# Patient Record
Sex: Female | Born: 1940 | Race: White | Hispanic: No | Smoking: Never smoker
Health system: Southern US, Community
[De-identification: ages and names within clinical notes are randomized; demographics above are authoritative.]

## PROBLEM LIST (undated history)

## (undated) DIAGNOSIS — L678 Other hair color and hair shaft abnormalities: Secondary | ICD-10-CM

## (undated) DIAGNOSIS — B369 Superficial mycosis, unspecified: Secondary | ICD-10-CM

## (undated) DIAGNOSIS — R87619 Unspecified abnormal cytological findings in specimens from cervix uteri: Secondary | ICD-10-CM

## (undated) DIAGNOSIS — E119 Type 2 diabetes mellitus without complications: Secondary | ICD-10-CM

## (undated) DIAGNOSIS — I10 Essential (primary) hypertension: Secondary | ICD-10-CM

## (undated) DIAGNOSIS — F32A Depression, unspecified: Secondary | ICD-10-CM

## (undated) DIAGNOSIS — F329 Major depressive disorder, single episode, unspecified: Secondary | ICD-10-CM

## (undated) DIAGNOSIS — F410 Panic disorder [episodic paroxysmal anxiety] without agoraphobia: Secondary | ICD-10-CM

## (undated) DIAGNOSIS — C549 Malignant neoplasm of corpus uteri, unspecified: Secondary | ICD-10-CM

## (undated) DIAGNOSIS — L738 Other specified follicular disorders: Secondary | ICD-10-CM

## (undated) DIAGNOSIS — Z86711 Personal history of pulmonary embolism: Secondary | ICD-10-CM

## (undated) DIAGNOSIS — M549 Dorsalgia, unspecified: Secondary | ICD-10-CM

## (undated) DIAGNOSIS — E785 Hyperlipidemia, unspecified: Secondary | ICD-10-CM

## (undated) DIAGNOSIS — M48061 Spinal stenosis, lumbar region without neurogenic claudication: Secondary | ICD-10-CM

## (undated) DIAGNOSIS — J309 Allergic rhinitis, unspecified: Secondary | ICD-10-CM

## (undated) DIAGNOSIS — M25559 Pain in unspecified hip: Secondary | ICD-10-CM

## (undated) DIAGNOSIS — S92901A Unspecified fracture of right foot, initial encounter for closed fracture: Secondary | ICD-10-CM

## (undated) DIAGNOSIS — R609 Edema, unspecified: Secondary | ICD-10-CM

## (undated) HISTORY — DX: Edema, unspecified: R60.9

## (undated) HISTORY — DX: Hyperlipidemia, unspecified: E78.5

## (undated) HISTORY — DX: Panic disorder (episodic paroxysmal anxiety): F41.0

## (undated) HISTORY — DX: Major depressive disorder, single episode, unspecified: F32.9

## (undated) HISTORY — PX: TUBAL LIGATION: SHX77

## (undated) HISTORY — DX: Other specified follicular disorders: L73.8

## (undated) HISTORY — DX: Malignant neoplasm of corpus uteri, unspecified: C54.9

## (undated) HISTORY — DX: Unspecified abnormal cytological findings in specimens from cervix uteri: R87.619

## (undated) HISTORY — PX: BREAST BIOPSY: SHX20

## (undated) HISTORY — DX: Depression, unspecified: F32.A

## (undated) HISTORY — DX: Superficial mycosis, unspecified: B36.9

## (undated) HISTORY — DX: Morbid (severe) obesity due to excess calories: E66.01

## (undated) HISTORY — DX: Allergic rhinitis, unspecified: J30.9

## (undated) HISTORY — DX: Essential (primary) hypertension: I10

## (undated) HISTORY — DX: Type 2 diabetes mellitus without complications: E11.9

## (undated) HISTORY — DX: Dorsalgia, unspecified: M54.9

## (undated) HISTORY — DX: Spinal stenosis, lumbar region without neurogenic claudication: M48.061

## (undated) HISTORY — DX: Other hair color and hair shaft abnormalities: L67.8

## (undated) HISTORY — DX: Personal history of pulmonary embolism: Z86.711

## (undated) HISTORY — DX: Unspecified fracture of right foot, initial encounter for closed fracture: S92.901A

## (undated) HISTORY — DX: Pain in unspecified hip: M25.559

## (undated) HISTORY — PX: TONSILLECTOMY: SUR1361

---

## 1998-04-09 ENCOUNTER — Other Ambulatory Visit: Admission: RE | Admit: 1998-04-09 | Discharge: 1998-04-09 | Payer: Self-pay | Admitting: *Deleted

## 1999-05-11 ENCOUNTER — Emergency Department (HOSPITAL_COMMUNITY): Admission: EM | Admit: 1999-05-11 | Discharge: 1999-05-11 | Payer: Self-pay | Admitting: Emergency Medicine

## 1999-05-11 ENCOUNTER — Encounter: Payer: Self-pay | Admitting: Emergency Medicine

## 1999-05-17 ENCOUNTER — Encounter: Payer: Self-pay | Admitting: Emergency Medicine

## 1999-05-17 ENCOUNTER — Inpatient Hospital Stay (HOSPITAL_COMMUNITY): Admission: EM | Admit: 1999-05-17 | Discharge: 1999-05-23 | Payer: Self-pay | Admitting: Emergency Medicine

## 1999-06-04 ENCOUNTER — Other Ambulatory Visit: Admission: RE | Admit: 1999-06-04 | Discharge: 1999-06-04 | Payer: Self-pay | Admitting: *Deleted

## 2000-08-24 ENCOUNTER — Other Ambulatory Visit: Admission: RE | Admit: 2000-08-24 | Discharge: 2000-08-24 | Payer: Self-pay | Admitting: *Deleted

## 2004-03-25 ENCOUNTER — Encounter: Payer: Self-pay | Admitting: Family Medicine

## 2004-03-25 ENCOUNTER — Other Ambulatory Visit: Admission: RE | Admit: 2004-03-25 | Discharge: 2004-03-25 | Payer: Self-pay | Admitting: Family Medicine

## 2004-10-12 ENCOUNTER — Ambulatory Visit: Payer: Self-pay | Admitting: Family Medicine

## 2005-08-03 ENCOUNTER — Ambulatory Visit: Payer: Self-pay | Admitting: Family Medicine

## 2005-09-06 ENCOUNTER — Ambulatory Visit: Payer: Self-pay | Admitting: Family Medicine

## 2005-09-08 ENCOUNTER — Ambulatory Visit: Payer: Self-pay | Admitting: Family Medicine

## 2005-09-14 ENCOUNTER — Encounter: Admission: RE | Admit: 2005-09-14 | Discharge: 2005-09-14 | Payer: Self-pay | Admitting: Family Medicine

## 2005-09-14 ENCOUNTER — Ambulatory Visit: Payer: Self-pay | Admitting: Family Medicine

## 2005-09-28 ENCOUNTER — Ambulatory Visit: Payer: Self-pay | Admitting: Family Medicine

## 2005-10-01 ENCOUNTER — Ambulatory Visit: Payer: Self-pay | Admitting: Family Medicine

## 2005-10-08 ENCOUNTER — Ambulatory Visit: Payer: Self-pay | Admitting: Internal Medicine

## 2005-10-25 ENCOUNTER — Ambulatory Visit: Payer: Self-pay | Admitting: Family Medicine

## 2005-11-24 ENCOUNTER — Ambulatory Visit: Payer: Self-pay | Admitting: Family Medicine

## 2005-11-25 ENCOUNTER — Ambulatory Visit: Payer: Self-pay | Admitting: Family Medicine

## 2005-12-07 ENCOUNTER — Ambulatory Visit: Payer: Self-pay | Admitting: Family Medicine

## 2005-12-23 ENCOUNTER — Ambulatory Visit: Payer: Self-pay | Admitting: Family Medicine

## 2006-03-23 ENCOUNTER — Ambulatory Visit: Payer: Self-pay | Admitting: Family Medicine

## 2006-03-25 ENCOUNTER — Ambulatory Visit: Payer: Self-pay | Admitting: Family Medicine

## 2006-06-06 ENCOUNTER — Ambulatory Visit: Payer: Self-pay | Admitting: Family Medicine

## 2006-06-24 ENCOUNTER — Ambulatory Visit: Payer: Self-pay | Admitting: Family Medicine

## 2006-08-04 ENCOUNTER — Ambulatory Visit: Payer: Self-pay | Admitting: Family Medicine

## 2006-08-12 ENCOUNTER — Ambulatory Visit: Payer: Self-pay | Admitting: Family Medicine

## 2006-08-25 HISTORY — PX: ENDOMETRIAL BIOPSY: SHX622

## 2006-08-30 ENCOUNTER — Ambulatory Visit: Payer: Self-pay | Admitting: Family Medicine

## 2006-08-31 ENCOUNTER — Ambulatory Visit (HOSPITAL_COMMUNITY): Admission: RE | Admit: 2006-08-31 | Discharge: 2006-08-31 | Payer: Self-pay | Admitting: Family Medicine

## 2006-09-01 ENCOUNTER — Ambulatory Visit: Payer: Self-pay | Admitting: Family Medicine

## 2006-09-19 ENCOUNTER — Ambulatory Visit: Payer: Self-pay | Admitting: Family Medicine

## 2006-10-10 ENCOUNTER — Ambulatory Visit: Payer: Self-pay | Admitting: Family Medicine

## 2006-10-10 ENCOUNTER — Ambulatory Visit (HOSPITAL_COMMUNITY): Admission: RE | Admit: 2006-10-10 | Discharge: 2006-10-10 | Payer: Self-pay | Admitting: Family Medicine

## 2006-10-10 ENCOUNTER — Encounter (INDEPENDENT_AMBULATORY_CARE_PROVIDER_SITE_OTHER): Payer: Self-pay | Admitting: *Deleted

## 2007-02-08 ENCOUNTER — Ambulatory Visit: Payer: Self-pay | Admitting: Family Medicine

## 2007-03-24 ENCOUNTER — Encounter: Payer: Self-pay | Admitting: Family Medicine

## 2007-03-24 DIAGNOSIS — M549 Dorsalgia, unspecified: Secondary | ICD-10-CM | POA: Insufficient documentation

## 2007-03-24 DIAGNOSIS — I10 Essential (primary) hypertension: Secondary | ICD-10-CM | POA: Insufficient documentation

## 2007-03-24 DIAGNOSIS — E1169 Type 2 diabetes mellitus with other specified complication: Secondary | ICD-10-CM | POA: Insufficient documentation

## 2007-03-24 DIAGNOSIS — F32A Depression, unspecified: Secondary | ICD-10-CM | POA: Insufficient documentation

## 2007-03-24 DIAGNOSIS — Z86718 Personal history of other venous thrombosis and embolism: Secondary | ICD-10-CM | POA: Insufficient documentation

## 2007-03-24 DIAGNOSIS — R609 Edema, unspecified: Secondary | ICD-10-CM

## 2007-03-24 DIAGNOSIS — F410 Panic disorder [episodic paroxysmal anxiety] without agoraphobia: Secondary | ICD-10-CM | POA: Insufficient documentation

## 2007-03-24 DIAGNOSIS — E669 Obesity, unspecified: Secondary | ICD-10-CM | POA: Insufficient documentation

## 2007-03-24 DIAGNOSIS — F329 Major depressive disorder, single episode, unspecified: Secondary | ICD-10-CM

## 2007-03-24 DIAGNOSIS — E785 Hyperlipidemia, unspecified: Secondary | ICD-10-CM | POA: Insufficient documentation

## 2007-03-24 DIAGNOSIS — L738 Other specified follicular disorders: Secondary | ICD-10-CM | POA: Insufficient documentation

## 2007-05-16 ENCOUNTER — Ambulatory Visit: Payer: Self-pay | Admitting: Family Medicine

## 2007-06-06 ENCOUNTER — Encounter: Payer: Self-pay | Admitting: Family Medicine

## 2007-07-13 ENCOUNTER — Ambulatory Visit: Payer: Self-pay | Admitting: Family Medicine

## 2007-07-13 DIAGNOSIS — E039 Hypothyroidism, unspecified: Secondary | ICD-10-CM | POA: Insufficient documentation

## 2007-08-09 ENCOUNTER — Ambulatory Visit: Payer: Self-pay | Admitting: Family Medicine

## 2007-08-09 LAB — CONVERTED CEMR LAB
Glucose, Urine, Semiquant: NEGATIVE
Nitrite: NEGATIVE

## 2007-08-26 LAB — CONVERTED CEMR LAB: Pap Smear: NORMAL

## 2007-08-29 ENCOUNTER — Ambulatory Visit: Payer: Self-pay | Admitting: Family Medicine

## 2007-08-29 ENCOUNTER — Encounter: Payer: Self-pay | Admitting: Obstetrics & Gynecology

## 2007-09-11 ENCOUNTER — Encounter: Admission: RE | Admit: 2007-09-11 | Discharge: 2007-09-11 | Payer: Self-pay | Admitting: Obstetrics & Gynecology

## 2008-01-02 ENCOUNTER — Encounter: Payer: Self-pay | Admitting: Family Medicine

## 2008-01-19 ENCOUNTER — Ambulatory Visit: Payer: Self-pay | Admitting: Family Medicine

## 2008-02-12 ENCOUNTER — Encounter (INDEPENDENT_AMBULATORY_CARE_PROVIDER_SITE_OTHER): Payer: Self-pay | Admitting: *Deleted

## 2008-02-15 ENCOUNTER — Encounter (INDEPENDENT_AMBULATORY_CARE_PROVIDER_SITE_OTHER): Payer: Self-pay | Admitting: *Deleted

## 2008-03-28 ENCOUNTER — Ambulatory Visit: Payer: Self-pay | Admitting: Family Medicine

## 2008-03-29 LAB — CONVERTED CEMR LAB
Albumin: 3.4 g/dL — ABNORMAL LOW (ref 3.5–5.2)
Chloride: 104 meq/L (ref 96–112)
Cholesterol: 172 mg/dL (ref 0–200)
GFR calc Af Amer: 81 mL/min
GFR calc non Af Amer: 67 mL/min
LDL Cholesterol: 91 mg/dL (ref 0–99)
Potassium: 4.4 meq/L (ref 3.5–5.1)
TSH: 5.06 microintl units/mL (ref 0.35–5.50)
Total Protein: 6.9 g/dL (ref 6.0–8.3)
Triglycerides: 133 mg/dL (ref 0–149)
VLDL: 27 mg/dL (ref 0–40)

## 2008-04-04 ENCOUNTER — Ambulatory Visit: Payer: Self-pay | Admitting: Family Medicine

## 2008-06-13 ENCOUNTER — Ambulatory Visit: Payer: Self-pay | Admitting: Family Medicine

## 2008-06-13 LAB — CONVERTED CEMR LAB: OCCULT 1: NEGATIVE

## 2008-07-10 ENCOUNTER — Encounter (INDEPENDENT_AMBULATORY_CARE_PROVIDER_SITE_OTHER): Payer: Self-pay | Admitting: *Deleted

## 2008-07-22 ENCOUNTER — Ambulatory Visit: Payer: Self-pay | Admitting: Family Medicine

## 2008-07-24 ENCOUNTER — Ambulatory Visit: Payer: Self-pay | Admitting: Family Medicine

## 2008-07-24 LAB — CONVERTED CEMR LAB
Microalb, Ur: 6.8 mg/dL — ABNORMAL HIGH (ref 0.0–1.9)
TSH: 7.26 microintl units/mL — ABNORMAL HIGH (ref 0.35–5.50)

## 2009-01-15 ENCOUNTER — Telehealth (INDEPENDENT_AMBULATORY_CARE_PROVIDER_SITE_OTHER): Payer: Self-pay | Admitting: *Deleted

## 2009-08-07 ENCOUNTER — Telehealth: Payer: Self-pay | Admitting: Family Medicine

## 2009-08-14 ENCOUNTER — Ambulatory Visit: Payer: Self-pay | Admitting: Family Medicine

## 2009-08-15 ENCOUNTER — Other Ambulatory Visit: Admission: RE | Admit: 2009-08-15 | Discharge: 2009-08-15 | Payer: Self-pay | Admitting: Family Medicine

## 2009-08-15 ENCOUNTER — Ambulatory Visit: Payer: Self-pay | Admitting: Family Medicine

## 2009-08-15 ENCOUNTER — Encounter: Payer: Self-pay | Admitting: Family Medicine

## 2009-08-15 DIAGNOSIS — B369 Superficial mycosis, unspecified: Secondary | ICD-10-CM | POA: Insufficient documentation

## 2009-08-15 LAB — CONVERTED CEMR LAB
CO2: 27 meq/L (ref 19–32)
Chloride: 104 meq/L (ref 96–112)
Creatinine, Ser: 0.9 mg/dL (ref 0.4–1.2)
GFR calc non Af Amer: 66.29 mL/min (ref >60)
Phosphorus: 2.6 mg/dL (ref 2.3–4.6)
Sodium: 141 meq/L (ref 135–145)
TSH: 4 microintl units/mL (ref 0.35–5.50)

## 2009-08-25 ENCOUNTER — Encounter: Payer: Self-pay | Admitting: Family Medicine

## 2009-09-25 ENCOUNTER — Encounter (INDEPENDENT_AMBULATORY_CARE_PROVIDER_SITE_OTHER): Payer: Self-pay | Admitting: *Deleted

## 2009-10-07 ENCOUNTER — Ambulatory Visit: Payer: Self-pay | Admitting: Family Medicine

## 2009-10-07 LAB — CONVERTED CEMR LAB
ALT: 15 units/L (ref 0–35)
AST: 17 units/L (ref 0–37)
Basophils Relative: 0.1 % (ref 0.0–3.0)
Creatinine,U: 177.4 mg/dL
Eosinophils Relative: 1.3 % (ref 0.0–5.0)
Hgb A1c MFr Bld: 6.8 % — ABNORMAL HIGH (ref 4.6–6.5)
Lymphocytes Relative: 41.2 % (ref 12.0–46.0)
MCV: 92.1 fL (ref 78.0–100.0)
Microalb Creat Ratio: 85.7 mg/g — ABNORMAL HIGH (ref 0.0–30.0)
Microalb, Ur: 15.2 mg/dL — ABNORMAL HIGH (ref 0.0–1.9)
Monocytes Relative: 8.1 % (ref 3.0–12.0)
Neutrophils Relative %: 49.3 % (ref 43.0–77.0)
RBC: 4.93 M/uL (ref 3.87–5.11)
Total CHOL/HDL Ratio: 3
Triglycerides: 157 mg/dL — ABNORMAL HIGH (ref 0.0–149.0)
WBC: 10.8 10*3/uL — ABNORMAL HIGH (ref 4.5–10.5)

## 2010-02-10 ENCOUNTER — Ambulatory Visit: Payer: Self-pay | Admitting: Family Medicine

## 2010-02-10 LAB — CONVERTED CEMR LAB
Bilirubin Urine: NEGATIVE
Glucose, Urine, Semiquant: NEGATIVE
WBC Urine, dipstick: NEGATIVE
pH: 5

## 2010-02-27 ENCOUNTER — Ambulatory Visit: Payer: Self-pay | Admitting: Family Medicine

## 2010-03-02 ENCOUNTER — Encounter: Admission: RE | Admit: 2010-03-02 | Discharge: 2010-03-02 | Payer: Self-pay | Admitting: Family Medicine

## 2010-07-22 ENCOUNTER — Ambulatory Visit: Payer: Self-pay | Admitting: Family Medicine

## 2010-07-22 DIAGNOSIS — J309 Allergic rhinitis, unspecified: Secondary | ICD-10-CM | POA: Insufficient documentation

## 2010-07-23 LAB — CONVERTED CEMR LAB
AST: 16 units/L (ref 0–37)
Albumin: 3.9 g/dL (ref 3.5–5.2)
Alkaline Phosphatase: 97 units/L (ref 39–117)
BUN: 14 mg/dL (ref 6–23)
Basophils Absolute: 0 10*3/uL (ref 0.0–0.1)
Basophils Relative: 0.3 % (ref 0.0–3.0)
CO2: 26 meq/L (ref 19–32)
Chloride: 105 meq/L (ref 96–112)
Cholesterol: 192 mg/dL (ref 0–200)
GFR calc non Af Amer: 74.65 mL/min (ref >60)
HCT: 46.1 % — ABNORMAL HIGH (ref 36.0–46.0)
Hemoglobin: 15.6 g/dL — ABNORMAL HIGH (ref 12.0–15.0)
Hgb A1c MFr Bld: 7.1 % — ABNORMAL HIGH (ref 4.6–6.5)
Lymphocytes Relative: 29.2 % (ref 12.0–46.0)
Lymphs Abs: 3.9 10*3/uL (ref 0.7–4.0)
MCHC: 33.9 g/dL (ref 30.0–36.0)
Monocytes Relative: 5.3 % (ref 3.0–12.0)
Neutro Abs: 8.4 10*3/uL — ABNORMAL HIGH (ref 1.4–7.7)
Potassium: 4.7 meq/L (ref 3.5–5.1)
RBC: 4.92 M/uL (ref 3.87–5.11)
RDW: 14 % (ref 11.5–14.6)
TSH: 2.5 microintl units/mL (ref 0.35–5.50)
Total Protein: 7.1 g/dL (ref 6.0–8.3)

## 2010-09-02 ENCOUNTER — Encounter (INDEPENDENT_AMBULATORY_CARE_PROVIDER_SITE_OTHER): Payer: Self-pay | Admitting: *Deleted

## 2010-10-30 ENCOUNTER — Ambulatory Visit: Admit: 2010-10-30 | Payer: Self-pay | Admitting: Family Medicine

## 2010-11-14 ENCOUNTER — Encounter: Payer: Self-pay | Admitting: Family Medicine

## 2010-11-15 ENCOUNTER — Encounter: Payer: Self-pay | Admitting: Family Medicine

## 2010-11-26 ENCOUNTER — Encounter: Payer: Self-pay | Admitting: Family Medicine

## 2010-11-26 ENCOUNTER — Ambulatory Visit: Payer: Medicare Other | Admitting: Family Medicine

## 2010-11-26 DIAGNOSIS — J019 Acute sinusitis, unspecified: Secondary | ICD-10-CM

## 2010-11-26 DIAGNOSIS — I10 Essential (primary) hypertension: Secondary | ICD-10-CM

## 2010-11-26 NOTE — Miscellaneous (Signed)
Summary: test strips  Clinical Lists Changes  Medications: Changed medication from [facility] TEST   STRP (GLUCOSE BLOOD), AND LANCETS check blood sugar once daily and as needed for diabetes 250.0 to [facility] TEST  STRP (GLUCOSE BLOOD) check blood sugar once daily and as directed     Prior Medications: CLONAZEPAM 1 MG TABS (CLONAZEPAM) take one by mouth three  times a day as needed OMEPRAZOLE 20 MG CPDR (OMEPRAZOLE) take one by mouth daily HYDROCHLOROTHIAZIDE 25 MG TABS (HYDROCHLOROTHIAZIDE) take one by mouth daily as needed MULTI-VITAMIN  TABS (MULTIPLE VITAMIN) take by mouth as directed ZOVIRAX  OINT (ACYCLOVIR OINT) use as directed NASAL SPRAY SALINE  SOLN (SALINE SOLN) use as directed LISINOPRIL 10 MG TABS (LISINOPRIL) 1 by mouth each am ONETOUCH ULTRASMART W/DEVICE KIT (BLOOD GLUCOSE MONITORING SUPPL)  LEVOTHYROXINE SODIUM 50 MCG TABS (LEVOTHYROXINE SODIUM) 1 by mouth once daily VYTORIN 10-20 MG  TABS (EZETIMIBE-SIMVASTATIN) take one by mouth daily ONETOUCH TEST  STRP (GLUCOSE BLOOD) check blood sugar once daily and as directed OMEGA 3 FISH OIL 1000 MG () 1 by mouth once daily BUDEPRION XL 150 MG XR24H-TAB (BUPROPION HCL) three tabs in AM EFFEXOR XR 150 MG XR24H-CAP (VENLAFAXINE HCL) two in AM NYSTATIN 100000 UNIT/GM CREA (NYSTATIN) apply to affected areas once daily as needed rash SEROQUEL 50 MG TABS (QUETIAPINE FUMARATE) 1/2 by mouth at bedtimes as needed ALLI 60MG  CAPS () take one capsule three times a day with meals VITAMIN D 1000 UNIT  TABS (CHOLECALCIFEROL) take one daily BC FAST PAIN RELIEF 845-65 MG PACK (ASPIRIN-CAFFEINE) OTC As directed. ZYRTEC ALLERGY 10 MG TABS (CETIRIZINE HCL) 1 by mouth once daily FLONASE 50 MCG/ACT SUSP (FLUTICASONE PROPIONATE) 2 sprays in each nostril once daily MENTAX 1 % CREA (BUTENAFINE HCL) apply to affected area once daily AUGMENTIN 875-125 MG TABS (AMOXICILLIN-POT CLAVULANATE) 1 by mouth two times a day for 10 days for sinus  infection Current Allergies: ! * LAMICTAL

## 2010-11-26 NOTE — Assessment & Plan Note (Signed)
Summary: 2 WK F/U PER BEDSOLE   Vital Signs:  Patient profile:   70 year old female Height:      65.75 inches Weight:      350.25 pounds BMI:     57.17 O2 Sat:      92 % on Room air Temp:     98.5 degrees F oral Pulse rate:   92 / minute Pulse rhythm:   regular BP sitting:   130 / 90  (right arm) Cuff size:   large  Vitals Entered By: Lewanda Rife LPN (Feb 27, 1477 2:17 PM)  O2 Flow:  Room air CC: 2 week f/u per Dr Ermalene Searing   History of Present Illness: here for f/u of uti  was tx with cipro for uti and cx grew out group B strep is now feeling totally better with no symptoms and does not think she needs abx  hurt her L shoulder/arm  thinks she was pulling or pushing with it  now it is really sore hard to fasten her bra in the back  1-2 weeks  hurts primarily in the front of shoulder  is dull deep ache  took some ibuprofen - helped a bit  has never hurt this shoulder before  was told she has a little deformity on it on x ray in the past   saw Dr Dayton Martes and gave her copy of her last labs saw Ocie Bob -- and asked if alli for wt loss  she in general tries to eat fatty foods  does eat butter with her oatmeal  occ fried food - fish  red meat twice per week no eggs   Allergies: 1)  ! * Lamictal  Past History:  Past Medical History: Last updated: 07/24/2008 Depression, mood disorder (? bipolar) Diabetes mellitus, type II Hyperlipidemia Hypertension Pulmonary embolism, hx of -multiple morbid obesity   psychiatry - Dr Koren Bound  counselor -- Micheline Chapman (sp?)  Past Surgical History: Last updated: 08/09/2007 Tubal ligation Tonsillectomy Right breast biopsy- benign Right foot fracture ? abnormal paps Endometrial biopsy attempted (08/2006) Pelvic US- gyn no ovaries  Family History: Last updated: 2008/04/23 Father: Deceased age 65 - MI Mother: deceased- pancreatic cancer w/ mets Siblings: 1 sister twin sons-- with hyperlipidemia son- gout   Social  History: Last updated: 07/24/2008 Marital Status: widow--husb died with MI, DM Children: 4 sons Occupation: disability non smoker  compulsive overeater   Risk Factors: Smoking Status: never (03/24/2007)  Review of Systems General:  Complains of fatigue; denies chills, fever, loss of appetite, and malaise. Eyes:  Denies blurring and eye pain. CV:  Denies chest pain or discomfort and palpitations. Resp:  Denies cough, shortness of breath, and wheezing. GI:  Denies indigestion, nausea, and vomiting. GU:  Denies dysuria, hematuria, and urinary frequency. MS:  Complains of joint pain; denies joint redness and joint swelling. Derm:  Denies itching, lesion(s), poor wound healing, and rash. Neuro:  Denies numbness and tingling. Psych:  Complains of anxiety and depression; her depression is a bit better lately. Endo:  Denies excessive thirst and excessive urination. Heme:  Denies abnormal bruising and bleeding.  Physical Exam  General:  morbidly obese and well appearing Eyes:  vision grossly intact, pupils equal, pupils round, and pupils reactive to light.  no conjunctival pallor, injection or icterus  Mouth:  pharynx pink and moist.   Neck:  supple with full rom and no masses or thyromegally, no JVD or carotid bruit  Lungs:  Normal respiratory effort, chest expands  symmetrically. Lungs are clear to auscultation, no crackles or wheezes. Heart:  Normal rate and regular rhythm. S1 and S2 normal without gallop, murmur, click, rub or other extra sounds. Abdomen:  no suprapubic tenderness or fullness felt  no renal bruits  soft, normal bowel sounds, and no distention.   Msk:  L shoulder  no swelling or deformity  tender over acromion and deltoid area (nl rom neck)  pain to abd over 90deg but can do  pain to int rotate  mild pain to ext rotate  pos hawking's and neer tests  nl grip/sens/strength   Pulses:  nl perf  Extremities:  no CCE Neurologic:  sensation intact to light touch,  gait normal, and DTRs symmetrical and normal.   Skin:  Intact without suspicious lesions or rashes Cervical Nodes:  No lymphadenopathy noted Inguinal Nodes:  No significant adenopathy Psych:  less tearful today very nervous and shaky as usual  Diabetes Management Exam:    Foot Exam (with socks and/or shoes not present):       Sensory-Pinprick/Light touch:          Left medial foot (L-4): normal          Left dorsal foot (L-5): normal          Left lateral foot (S-1): normal       Sensory-Monofilament:          Left foot: normal       Inspection:          Left foot: normal       Nails:          Left foot: normal   Impression & Recommendations:  Problem # 1:  SHOULDER PAIN, LEFT (ICD-719.41) Assessment New with acromion tenderness and limited rom shoulder no particular trauma- but pt sites a ? bony anomoly in the past  will send for xray I gave her the ok to try aleve small dose short term if it does not bother the stomach  will make plan when x ray returns  Orders: Radiology Referral (Radiology)  Problem # 2:  ESSENTIAL HYPERTENSION, BENIGN (ICD-401.1) Assessment: Deteriorated bp fair today- but pt was in pain  f/u 1 mo after labs  Her updated medication list for this problem includes:    Hydrochlorothiazide 25 Mg Tabs (Hydrochlorothiazide) .Marland Kitchen... Take one by mouth daily as needed    Lisinopril 10 Mg Tabs (Lisinopril) .Marland Kitchen... 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 Apply to affected areas once daily as needed rash  Orders: Prescription  Created Electronically [phone])  Problem # 4:  DIABETES MELLITUS, TYPE II (ICD-250.00) Assessment: Unchanged  last AIC 6.8- reviewed pt has been noncompliant with visits  disc healthy diet (low simple sugar/ choose complex carbs/ low sat fat) diet and exercise in detail  disc need for wt loss lab in 1 mo and f/u Her updated medication list for this problem includes:    Lisinopril 10 Mg Tabs (Lisinopril) .Marland Kitchen... 1 by mouth each am  Labs Reviewed: Creat: 0.9 (08/14/2009)    Reviewed HgBA1c results: 6.8 (  10/07/2009)  6.5 (07/22/2008)  Problem # 5:  OBESITY (ICD-278.00) Assessment: Deteriorated morbid obesity- starting to affect exercise tolerance and lifestyle again had long talk about less calories/ more activity still struggles with emotional eating - will continue counseling  disc pros/ cons of orlistat- I expl how it works and pt may try it to enc her to avoid fats in diet  f/u 1 mo   Problem # 6:  UTI (ICD-599.0) Assessment: Improved symptoms are resolved on quinolone if they return - would need to consider pcn for group B strep  Complete Medication List: 1)  Clonazepam 1 Mg Tabs (Clonazepam) .... Take one by mouth three  times a day 2)  Omeprazole 20 Mg Cpdr (Omeprazole) .... Take one by mouth daily 3)  Hydrochlorothiazide 25 Mg Tabs (Hydrochlorothiazide) .... Take one by mouth daily as needed 4)  Multi-vitamin Tabs (Multiple vitamin) .... Take by mouth as directed 5)  Zovirax Oint (Acyclovir oint) .... Use as directed 6)  Nasal Spray Saline Soln (Saline soln) .... Use as directed 7)  Lisinopril 10 Mg Tabs (Lisinopril) .Marland Kitchen.. 1 by mouth each am 8)  Onetouch Ultrasmart W/device Kit (Blood glucose monitoring suppl) 9)  Levothyroxine Sodium 50 Mcg Tabs (Levothyroxine sodium) .Marland Kitchen.. 1 by mouth once daily 10)  Vytorin 10-20 Mg Tabs (Ezetimibe-simvastatin) .... Take one by mouth daily 11)  Onetouch Test Strp (glucose Blood), and Lancets  .... Check blood sugar once daily and as needed  for diabetes 250.0 12)  Omega 3 Fish Oil 1000 Mg  .Marland KitchenMarland Kitchen. 1 by mouth once daily 13)  Budeprion Xl 150 Mg Xr24h-tab (Bupropion hcl) .... Three tabs in am 14)  Effexor Xr 150 Mg Xr24h-cap (Venlafaxine hcl) .... Two in am 15)  Nystatin 100000 Unit/gm Crea (Nystatin) .... Apply to affected areas once daily as needed rash 16)  Seroquel 50 Mg Tabs (Quetiapine fumarate) .... One by mouth at bedtimes as needed 17)  Alli 60mg  Caps  .... Take one capsule three times a day with meals 18)  Vitamin D 1000 Unit Tabs (Cholecalciferol) .... Take one daily  Patient Instructions: 1)  go ahead and try the ali/orlistat if you think it would help inspire you to eat better  2)  keep working with your counselor about emotional eating issues  3)  we will schedule x rays at check out  4)  1 aleve two times a day with food is ok for pain if it does not hurt your stomach - until we get results back 5)  make sure to schedule your mammogram  6)  schedule fasting lab in 1 month and then follow up lipid/ast/alt/AIC /renal 250.0 272, cbc with diff 401.1, and tsh 244.9  7)  work hard on healthy diet  Prescriptions: NYSTATIN 100000 UNIT/GM CREA (NYSTATIN) apply to affected areas once daily as needed rash  #30medium x 1   Entered and Authorized by:   Judith Part MD   Signed by:   Judith Part MD on 02/27/2010   Method used:   [facility]        [phone] (retail)       [address]       [place], [place]  [address]       Ph: [phone] or [phone]       Fax: [phone]   RxID:   [id number]   Current Allergies (reviewed today): ! * LAMICTAL

## 2010-11-26 NOTE — Assessment & Plan Note (Signed)
Summary: ?UTI/CLE   Vital Signs:  Patient profile:   70 year old female Height:      65.75 inches Weight:      349 pounds BMI:     56.96 O2 Sat:      94 % Temp:     97.9 degrees F oral Pulse rate:   96 / minute Pulse rhythm:   regular BP sitting:   130 / 80  (left arm) Cuff size:   large  Vitals Entered By: Benny Lennert CMA Duncan Dull) (February 10, 2010 11:59 AM)  History of Present Illness: 70 yo here for ?UTI. 2 weeks of dysuria, increased urinary frequency. some back pain. No hematuria, nausea, vomiting, fevers or diarrhea.    Current Medications (verified): 1)  Clonazepam 1 Mg Tabs (Clonazepam) .... Take One By Mouth Four Times A Day 2)  Omeprazole 20 Mg Cpdr (Omeprazole) .... Take One By Mouth Daily 3)  Hydrochlorothiazide 25 Mg Tabs (Hydrochlorothiazide) .... Take One By Mouth Daily As Needed 4)  Multi-Vitamin  Tabs (Multiple Vitamin) .... Take By Mouth As Directed 5)  Zovirax  Oint (Acyclovir Oint) .... Use As Directed 6)  Nasal Spray Saline  Soln (Saline Soln) .... Use As Directed 7)  Lisinopril 10 Mg Tabs (Lisinopril) .Marland Kitchen.. 1 By Mouth Each Am 8)  Onetouch Ultrasmart W/device Kit (Blood Glucose Monitoring Suppl) 9)  Levothyroxine Sodium 50 Mcg Tabs (Levothyroxine Sodium) .Marland Kitchen.. 1 By Mouth Once Daily 10)  Seroquel 100 Mg  Tabs (Quetiapine Fumarate) .... One By Mouth At Bedtime As Needed 11)  Vytorin 10-20 Mg  Tabs (Ezetimibe-Simvastatin) .... Take One By Mouth Daily 12)  Onetouch Test   Strp (Glucose Blood), and Lancets .... Check Blood Sugar Once Daily and As Needed For Diabetes 250.0 13)  Omega 3 Fish Oil 1000 Mg .Marland Kitchen.. 1 By Mouth Once Daily 14)  Budeprion Xl 150 Mg Xr24h-Tab (Bupropion Hcl) .... Three Tabs in Am 15)  Effexor Xr 150 Mg Xr24h-Cap (Venlafaxine Hcl) .... Two in Am 16)  Nystatin 100000 Unit/gm Crea (Nystatin) .... Apply To Affected Areas Once Daily As Needed Rash 17)  Cipro 500 Mg Tabs (Ciprofloxacin Hcl) .Marland Kitchen.. 1 By Mouth 2 Times Daily X 7 Days  Allergies: 1)   ! * Lamictal  Review of Systems      See HPI General:  Denies chills and fever. GI:  Denies nausea and vomiting. GU:  Complains of dysuria and urinary frequency; denies hematuria.  Physical Exam  General:  morbidly obese and well appearing Abdomen:  pos suprapubic tenderness NO CVA tenderness Psych:  anxious and with tremor - for whole exam this is her baseline    Impression & Recommendations:  Problem # 1:  DYSURIA (ICD-788.1) Assessment New UA neg for UTI but given her symptoms, will treat for UTI and send for culture.  Her updated medication list for this problem includes:    Cipro 500 Mg Tabs (Ciprofloxacin hcl) .Marland Kitchen... 1 by mouth 2 times daily x 7 days  Orders: UA Dipstick w/o Micro (manual) (09811) T-Culture, Urine ([id number]) Prescription Created Electronically [phone])  Complete Medication List: 1)  Clonazepam 1 Mg Tabs (Clonazepam) .... Take one by mouth four times a day 2)  Omeprazole 20 Mg Cpdr (Omeprazole) .... Take one by mouth daily 3)  Hydrochlorothiazide 25 Mg Tabs (Hydrochlorothiazide) .... Take one by mouth daily as needed 4)  Multi-vitamin Tabs (Multiple vitamin) .... Take by mouth as directed 5)  Zovirax Oint (Acyclovir oint) .... Use as directed 6)  Nasal Spray Saline  Soln (Saline soln) .... Use as directed 7)  Lisinopril 10 Mg Tabs (Lisinopril) .Marland Kitchen.. 1 by mouth each am 8)  Onetouch Ultrasmart W/device Kit (Blood glucose monitoring suppl) 9)  Levothyroxine Sodium 50 Mcg Tabs (Levothyroxine sodium) .Marland Kitchen.. 1 by mouth once daily 10)  Seroquel 100 Mg Tabs (Quetiapine fumarate) .... One by mouth at bedtime as needed 11)  Vytorin 10-20 Mg Tabs (Ezetimibe-simvastatin) .... Take one by mouth daily 12)  Onetouch Test Strp (glucose Blood), and Lancets  .... Check blood sugar once daily and as needed for diabetes 250.0 13)  Omega 3 Fish Oil 1000 Mg  .Marland KitchenMarland Kitchen. 1 by mouth once daily 14)  Budeprion Xl 150 Mg Xr24h-tab (Bupropion hcl) .... Three tabs in am 15)  Effexor Xr  150 Mg Xr24h-cap (Venlafaxine hcl) .... Two in am 16)  Nystatin 100000 Unit/gm Crea (Nystatin) .... Apply to affected areas once daily as needed rash 17)  Cipro 500 Mg Tabs (Ciprofloxacin hcl) .Marland Kitchen.. 1 by mouth 2 times daily x 7 days Prescriptions: CIPRO 500 MG TABS (CIPROFLOXACIN HCL) 1 by mouth 2 times daily x 7 days  #14 x 0   Entered and Authorized by:   Ruthe Mannan MD   Signed by:   Ruthe Mannan MD on 02/10/2010   Method used:   [facility] to        [facility] [phone]* (retail)       [address]       [place], [place]  [address]       Ph: [phone] or [phone]       Fax: [phone]   RxID:   [id number]   Current Allergies (reviewed today): ! * LAMICTAL  Laboratory Results   Urine Tests  Date/Time Received: February 10, 2010 12:02 PM  Date/Time Reported: February 10, 2010 12:02 PM   Routine Urinalysis   Color: yellow Appearance: Hazy Glucose: negative   (Normal Range: Negative) Bilirubin: negative   (Normal Range: Negative) Ketone: negative   (Normal Range: Negative) Spec. Gravity: >=1.030   (Normal Range: 1.003-1.035) Blood: negative   (Normal Range: Negative) pH: 5.0   (Normal Range: 5.0-8.0) Protein: negative   (Normal Range: Negative) Urobilinogen: 0.2   (Normal Range: 0-1) Nitrite: negative   (Normal Range: Negative) Leukocyte Esterace: negative   (Normal Range: Negative)        Appended Document: ?UTI/CLE

## 2010-11-26 NOTE — Assessment & Plan Note (Signed)
Summary: CONGESTION,EARS,NO ENERGY/CLE   Vital Signs:  Patient profile:   70 year old female Height:      65.75 inches Weight:      332.25 pounds BMI:     54.23 Temp:     98.8 degrees F oral Pulse rate:   96 / minute Pulse rhythm:   regular BP sitting:   130 / 100  (left arm) Cuff size:   large  Vitals Entered By: Lewanda Rife LPN (July 22, 2010 2:17 PM) CC: Conestion in sinuis area and drainage at back of throat. Ears ringing and no energy Comments Hard to hear BP   History of Present Illness: c/o congestion / fatigue ears are full and ringing - really driving her crazy assumes is allergy problems  nasal drainage -clear and occ green  post nasal drainage worse in am  some saline nasal spray and plain claritin as needed  some cough - non prod   more eczema lately   occ headache - one day was in her face   no fever   hx of nasal endoscopic surgery in past with polyps     takes bc and occ aleve  bc does have caffine in it   bp is up today-- is taking her bp med   has been using exercise bike  R foot s sometimes a bit swollen in ams   rash under breasts will not get better  keeps it clean  does itch  nystatin only helps a little      lost 18 lb  bp up today  Allergies: 1)  ! * Lamictal  Past History:  Past Medical History: Last updated: 07/24/2008 Depression, mood disorder (? bipolar) Diabetes mellitus, type II Hyperlipidemia Hypertension Pulmonary embolism, hx of -multiple morbid obesity   psychiatry - Dr Koren Bound  counselor -- Micheline Chapman (sp?)  Past Surgical History: Last updated: 08/09/2007 Tubal ligation Tonsillectomy Right breast biopsy- benign Right foot fracture ? abnormal paps Endometrial biopsy attempted (08/2006) Pelvic US- gyn no ovaries  Family History: Last updated: 04-18-08 Father: Deceased age 24 - [place] Mother: deceased- pancreatic cancer w/ mets Siblings: 1 sister twin sons-- with hyperlipidemia son- gout    Social History: Last updated: 07/24/2008 Marital Status: widow--husb died with MI, DM Children: 4 sons Occupation: disability non smoker  compulsive overeater   Risk Factors: Smoking Status: never (03/24/2007)  Review of Systems General:  Complains of fatigue; denies chills, fever, and weakness. Eyes:  Denies blurring and eye irritation. ENT:  Complains of earache, nasal congestion, postnasal drainage, and ringing in ears; denies sore throat. CV:  Denies chest pain or discomfort, lightheadness, palpitations, and shortness of breath with exertion. Resp:  Complains of cough; denies pleuritic, shortness of breath, sputum productive, and wheezing. GI:  Denies abdominal pain, change in bowel habits, nausea, and vomiting. GU:  Denies dysuria and urinary frequency. MS:  Complains of muscle aches and stiffness; denies cramps. Derm:  Denies itching, lesion(s), poor wound healing, and rash. Neuro:  Denies headaches, numbness, tingling, and visual disturbances. Psych:  Complains of anxiety and depression; denies suicidal thoughts/plans. Endo:  Denies excessive thirst, excessive urination, and heat intolerance. Heme:  Denies abnormal bruising and bleeding.  Physical Exam  General:  morbidly obese and well appearing Head:  normocephalic, atraumatic, and no abnormalities observed.  no sinus tenderness  Eyes:  vision grossly intact, pupils equal, pupils round, and pupils reactive to light.  no conjunctival pallor, injection or icterus  Ears:  R ear normal and L  ear normal.   Nose:  nares are boggy and congested bilat  Mouth:  pharynx pink and moist.   Neck:  supple with full rom and no masses or thyromegally, no JVD or carotid bruit  Chest Wall:  No deformities, masses, or tenderness noted. Lungs:  Normal respiratory effort, chest expands symmetrically. Lungs are clear to auscultation, no crackles or wheezes. Heart:  Normal rate and regular rhythm. S1 and S2 normal without gallop, murmur,  click, rub or other extra sounds. Abdomen:  Bowel sounds positive,abdomen soft and non-tender without masses, organomegaly or hernias noted. no renal bruits  Msk:  No deformity or scoliosis noted of thoracic or lumbar spine.  no new joint changes  Pulses:  R and L carotid,radial,femoral,dorsalis pedis and posterior tibial pulses are full and equal bilaterally Extremities:  No clubbing, cyanosis, edema, or deformity noted with normal full range of motion of all joints.   Neurologic:  sensation intact to light touch, gait normal, and DTRs symmetrical and normal.   baseline hand tremor  Skin:  erythematous rash under L breast- well circ with satellite lesions Cervical Nodes:  No lymphadenopathy noted Inguinal Nodes:  No significant adenopathy Psych:  affect is more anxious today- very jittery  Diabetes Management Exam:    Foot Exam (with socks and/or shoes not present):       Sensory-Pinprick/Light touch:          Left medial foot (L-4): normal          Left dorsal foot (L-5): normal          Left lateral foot (S-1): normal          Right medial foot (L-4): normal          Right dorsal foot (L-5): normal          Right lateral foot (S-1): normal       Sensory-Monofilament:          Left foot: normal          Right foot: normal       Inspection:          Left foot: normal          Right foot: normal       Nails:          Left foot: normal          Right foot: normal   Impression & Recommendations:  Problem # 1:  ALLERGIC RHINITIS (ICD-477.9) Assessment New will try zyrtec and flonase and f/u 1 mo  warned to avoid allergens Her updated medication list for this problem includes:    Nasal Spray Saline Soln (Saline soln) ..... Use as directed    Zyrtec Allergy 10 Mg Tabs (Cetirizine hcl) .Marland Kitchen... 1 by mouth once daily    Flonase 50 Mcg/act Susp (Fluticasone propionate) .Marland Kitchen... 2 sprays in each nostril once daily  Problem # 2:  OBESITY (ICD-278.00) Assessment: Improved some wt loss-  urged to keep up the exercise  Problem # 3:  HYPOTHYROIDISM (ICD-244.9) Assessment: Unchanged lab today and disc at f/u  no clinical change - but was more anx today Her updated medication list for this problem includes:    Levothyroxine Sodium 50 Mcg Tabs (Levothyroxine sodium) .Marland Kitchen... 1 by mouth once daily  Orders: Venipuncture (16109) TLB-Lipid Panel (80061-LIPID) TLB-Renal Function Panel (80069-RENAL) TLB-CBC Platelet - w/Differential (85025-CBCD) TLB-Hepatic/Liver Function Pnl (80076-HEPATIC) TLB-TSH (Thyroid Stimulating Hormone) (84443-TSH) TLB-A1C / Hgb A1C (Glycohemoglobin) (83036-A1C)  Problem # 4:  Hx of EDEMA (  ICD-782.3) Assessment: Unchanged c/o edema in R foot- I did not note any today will continue to monitor Her updated medication list for this problem includes:    Hydrochlorothiazide 25 Mg Tabs (Hydrochlorothiazide) .Marland Kitchen... Take one by mouth daily as needed  Orders: Venipuncture (16109) TLB-Lipid Panel (80061-LIPID) TLB-Renal Function Panel (80069-RENAL) TLB-CBC Platelet - w/Differential (85025-CBCD) TLB-Hepatic/Liver Function Pnl (80076-HEPATIC) TLB-TSH (Thyroid Stimulating Hormone) (84443-TSH) TLB-A1C / Hgb A1C (Glycohemoglobin) (83036-A1C)  Problem # 5:  HYPERTENSION (ICD-401.9) Assessment: Deteriorated  bp is up but pt taking BCs told to stop  also anx  re check at 1 mo f/u  urged to be compliant with diet and meds  Her updated medication list for this problem includes:    Hydrochlorothiazide 25 Mg Tabs (Hydrochlorothiazide) .Marland Kitchen... Take one by mouth daily as needed    Lisinopril 10 Mg Tabs (Lisinopril) .Marland Kitchen... 1 by mouth each am  Orders: Venipuncture (60454) TLB-Lipid Panel (80061-LIPID) TLB-Renal Function Panel (80069-RENAL) TLB-CBC Platelet - w/Differential (85025-CBCD) TLB-Hepatic/Liver Function Pnl (80076-HEPATIC) TLB-TSH (Thyroid Stimulating Hormone) (84443-TSH) TLB-A1C / Hgb A1C (Glycohemoglobin) (83036-A1C)  BP today: 130/100 Prior BP: 130/90  (02/27/2010)  Labs Reviewed: K+: 3.9 (08/14/2009) Creat: : 0.9 (08/14/2009)   Chol: 178 (10/07/2009)   HDL: 62.60 (10/07/2009)   LDL: 84 (10/07/2009)   TG: 157.0 (10/07/2009)  Problem # 6:  HYPERLIPIDEMIA (ICD-272.4) Assessment: Unchanged  lab today and disc at f/u  on vytorin diet so so Her updated medication list for this problem includes:    Vytorin 10-20 Mg Tabs (Ezetimibe-simvastatin) .Marland Kitchen... 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 Apply to affected areas once daily as needed rash    Mentax 1 % Crea (Butenafine hcl) .Marland Kitchen... Apply to affected area once daily  Orders: Prescription Created Electronically [id number])  Problem # 8:  DIABETES MELLITUS, TYPE II (ICD-250.00) Assessment: Unchanged  lab today and disc at f/u disc healthy diet (low simple sugar/ choose complex carbs/ low sat fat) diet and exercise in detail  Her updated medication list for this problem includes:    Lisinopril 10 Mg Tabs (Lisinopril) .Marland Kitchen... 1 by mouth each am  Orders: Venipuncture (29562) TLB-Lipid Panel (80061-LIPID) TLB-Renal Function Panel (80069-RENAL) TLB-CBC Platelet - w/Differential (85025-CBCD) TLB-Hepatic/Liver  Function Pnl (80076-HEPATIC) TLB-TSH (Thyroid Stimulating Hormone) (84443-TSH) TLB-A1C / Hgb A1C (Glycohemoglobin) (83036-A1C)  Labs Reviewed: Creat: 0.9 (08/14/2009)    Reviewed HgBA1c results: 6.8 (10/07/2009)  6.5 (07/22/2008)  Complete Medication List: 1)  Clonazepam 1 Mg Tabs (Clonazepam) .... Take one by mouth three  times a day as needed 2)  Omeprazole 20 Mg Cpdr (Omeprazole) .... Take one by mouth daily 3)  Hydrochlorothiazide 25 Mg Tabs (Hydrochlorothiazide) .... Take one by mouth daily as needed 4)  Multi-vitamin Tabs (Multiple vitamin) .... Take by mouth as directed 5)  Zovirax Oint (Acyclovir oint) .... Use as directed 6)  Nasal Spray Saline Soln (Saline soln) .Marland KitchenMarland KitchenMarland Kitchen  Use as directed 7)  Lisinopril 10 Mg Tabs (Lisinopril) .Marland Kitchen.. 1 by mouth each am 8)  Onetouch Ultrasmart W/device Kit (Blood glucose monitoring suppl) 9)  Levothyroxine Sodium 50 Mcg Tabs (Levothyroxine sodium) .Marland Kitchen.. 1 by mouth once daily 10)  Vytorin 10-20 Mg Tabs (Ezetimibe-simvastatin) .... Take one by mouth daily 11)  Onetouch Test Strp (glucose Blood), and Lancets  .... Check blood sugar once daily and as needed for diabetes 250.0 12)  Omega 3 Fish Oil 1000 Mg  .Marland KitchenMarland Kitchen. 1 by mouth once daily 13)  Budeprion Xl 150 Mg Xr24h-tab (Bupropion hcl) .... Three tabs in am 14)  Effexor Xr 150 Mg Xr24h-cap (Venlafaxine hcl) .... Two in am 15)  Nystatin 100000 Unit/gm Crea (Nystatin) .... Apply to affected areas once daily as needed rash 16)  Seroquel 50 Mg Tabs (Quetiapine fumarate) .... 1/2 by mouth at bedtimes as needed 17)  Alli 60mg  Caps  .... Take one capsule three times a day with meals 18)  Vitamin D 1000 Unit Tabs (Cholecalciferol) .... Take one daily 19)  Bc Fast Pain Relief 845-65 Mg Pack (Aspirin-caffeine) .... Otc as directed. 20)  Zyrtec Allergy 10 Mg Tabs (Cetirizine hcl) .Marland Kitchen.. 1 by mouth once daily 21)  Flonase 50 Mcg/act Susp (Fluticasone propionate) .... 2 sprays in each nostril once daily 22)  Mentax 1 % Crea  (Butenafine hcl) .... Apply to affected area once daily  Other Orders: Flu Vaccine 73yrs + MEDICARE PATIENTS (Z6109) Administration Flu vaccine - MCR (U0454)  Patient Instructions: 1)  stop BCs entirely  2)  keep skin rash as dry as possible especially after exercise  3)  use mentax cream-I will send that to your pharmacy  4)  keep taking current medicines and work on weight loss 5)  keep up the exercise 6)  blood pressure is high today  7)  change from claritin to zyrtec 10 mg daily over the counter -- take it at night  8)  start flonase nasal spray -- I will send that to your pharmacy  9)  labs today  10)  follow up with me in 1 month for 30 minute visit to review labs and re check rash  Prescriptions: MENTAX 1 % CREA (BUTENAFINE HCL) apply to affected area once daily  #1 medium x 0   Entered and Authorized by:   Judith Part MD   Signed by:   Judith Part MD on 07/22/2010   Method used:   Electronically to        [facility] [phone]* (retail)       [address]       [place], [place]  [address]       Ph: [phone] or [phone]       Fax: [phone]   RxID:   [id number] FLONASE 50 MCG/ACT SUSP (FLUTICASONE PROPIONATE) 2 sprays in each nostril once daily  #1 mdi x 11   Entered and Authorized by:   Judith Part MD   Signed by:   Judith Part MD on 07/22/2010   Method used:   Electronically to        [facility] [phone]* (retail)       [address]       [place], [place]  [address]       Ph: [phone] or [phone]       Fax:  [phone]   RxID:   [id number]   Current Allergies (reviewed today): ! * LAMICTAL       Flu Vaccine Consent Questions     Do you have a history of severe allergic reactions to this vaccine? no    Any prior history of allergic reactions to egg and/or gelatin? no    Do you have a sensitivity to the preservative Thimersol? no    Do you have a past  history of Guillan-Barre Syndrome? no    Do you currently have an acute febrile illness? no    Have you ever had a severe reaction to latex? no    Vaccine information given and explained to patient? yes    Are you currently pregnant? no    Lot Number:[id number]   Exp Date:04/24/2011   Site Given  Left Deltoid IMedflu Lewanda Rife LPN  July 22, 2010 2:51 PM

## 2010-12-02 NOTE — Assessment & Plan Note (Signed)
Summary: ? sinus infection   Vital Signs:  Patient profile:   70 year old female Height:      65.75 inches Weight:      338.50 pounds BMI:     55.25 Temp:     98.2 degrees F oral Pulse rate:   92 / minute Pulse rhythm:   regular BP sitting:   160 / 118  (left arm) Cuff size:   large  Vitals Entered By: Lewanda Rife LPN (November 26, 2010 10:07 AM) CC: ? sinus infection, head congested and when blows nose yellow to bloody mucus. H/a on and off.   History of Present Illness: has been sick on and off since sept  a lot of sinus drainage -- and nasal mucous - is yellow and green  both sides of nostrils are very red - with a little blood  head is hurting on and off -- more in top of head and sometimes around eyes and around nose   really tired and no every - not able to ride her bike - just worn out   ? if had fever - no chills or aches   not coughing - lungs are ok , no sore throat   ears itch a lot   bp is still really high --this is 2nd visit  has very difficult bp to hear - had to use digital cuff on her today   Allergies: 1)  ! * Lamictal  Past History:  Past Medical History: Last updated: 07/24/2008 Depression, mood disorder (? bipolar) Diabetes mellitus, type II Hyperlipidemia Hypertension Pulmonary embolism, hx of -multiple morbid obesity   psychiatry - Dr Koren Bound  counselor -- Micheline Chapman (sp?)  Past Surgical History: Last updated: 08/09/2007 Tubal ligation Tonsillectomy Right breast biopsy- benign Right foot fracture ? abnormal paps Endometrial biopsy attempted (08/2006) Pelvic US- gyn no ovaries  Family History: Last updated: 04-11-2008 Father: Deceased age 76 - [place] Mother: deceased- pancreatic cancer w/ mets Siblings: 1 sister twin sons-- with hyperlipidemia son- gout   Social History: Last updated: 07/24/2008 Marital Status: widow--husb died with MI, DM Children: 4 sons Occupation: disability non smoker  compulsive overeater   Risk  Factors: Smoking Status: never (03/24/2007)  Review of Systems General:  Complains of fatigue; denies chills and fever. Eyes:  Denies blurring, discharge, and eye pain. ENT:  Complains of postnasal drainage, sinus pressure, and sore throat; denies earache. CV:  Denies chest pain or discomfort and palpitations. Resp:  Complains of cough and wheezing; denies pleuritic and shortness of breath; falls asleep easily- does not think she has apnea . GI:  Denies nausea and vomiting. MS:  Denies cramps. Derm:  Denies itching, lesion(s), poor wound healing, and rash. Neuro:  Complains of headaches and tremors; denies sensation of room spinning, tingling, and visual disturbances. Psych:  Complains of anxiety and depression. Endo:  Denies excessive thirst and excessive urination.  Physical Exam  General:  morbidly obese and well appearing Head:  L maxillary and bilat ethmoid sinus tenderness normocephalic, atraumatic, and no abnormalities observed.   Eyes:  vision grossly intact, pupils equal, pupils round, and pupils reactive to light.  no conjunctival pallor, injection or icterus  Ears:  R ear normal and L ear normal.   Nose:  nares are injected and congested bilaterally  also dried blood Mouth:  pharynx pink and moist, no erythema, and no exudates.  some post nasal drip  Neck:  supple with full rom and no masses or thyromegally, no JVD or  carotid bruit  Chest Wall:  No deformities, masses, or tenderness noted. Lungs:  Normal respiratory effort, chest expands symmetrically. Lungs are clear to auscultation, no crackles or wheezes. Heart:  Normal rate and regular rhythm. S1 and S2 normal without gallop, murmur, click, rub or other extra sounds. Abdomen:  no renal bruits  Extremities:  No clubbing, cyanosis, edema, or deformity noted with normal full range of motion of all joints.   Neurologic:  sensation intact to light touch, gait normal, and DTRs symmetrical and normal.   baseline hand tremor    Skin:  Intact without suspicious lesions or rashes Cervical Nodes:  No lymphadenopathy noted Psych:  baseline anxious today but not tearful   Impression & Recommendations:  Problem # 1:  SINUSITIS, ACUTE (ICD-461.9) Assessment New  ongoing since fall tx with augmentin for 14 days - given length of illness recommend sympt care- see pt instructions   pt advised to update me if symptoms worsen or do not improve f/u 2-3 wk for re check  The following medications were removed from the medication list:    Augmentin 875-125 Mg Tabs (Amoxicillin-pot clavulanate) .Marland Kitchen... 1 by mouth two times a day for 10 days for sinus infection Her updated medication list for this problem includes:    Nasal Spray Saline Soln (Saline soln) ..... Use as directed    Flonase 50 Mcg/act Susp (Fluticasone propionate) .Marland Kitchen... 2 sprays in each nostril once daily    Augmentin 875-125 Mg Tabs (Amoxicillin-pot clavulanate) .Marland Kitchen... 1 by mouth two times a day for 14 days  Orders: Prescription Created Electronically [phone])  Problem # 2:  HYPERTENSION (ICD-401.9) Assessment: Deteriorated  worse and pt was noncompliant with f/u inc lisinopril to 20 update if side eff or problems  f/u 2-3 wk  will also address fatigue if not imp Her updated medication list for this problem includes:    Hydrochlorothiazide 25 Mg Tabs (Hydrochlorothiazide) .Marland Kitchen... Take one by mouth daily as needed    Lisinopril 20 Mg Tabs (Lisinopril) .Marland Kitchen... 1 by mouth once daily  BP today: 160/118 Prior BP: 130/100 (07/22/2010)  Labs Reviewed: K+: 4.7 (07/22/2010) Creat: : 0.8 (07/22/2010)   Chol: 192 (07/22/2010)   HDL: 69.90 (07/22/2010)   LDL: 84 (10/07/2009)   TG: 217.0 (07/22/2010)  Orders: Prescription Created Electronically [phone])  Complete Medication List: 1)  Clonazepam 1 Mg Tabs (Clonazepam) .... Take one by mouth three  times a day as needed 2)  Omeprazole 20 Mg Cpdr (Omeprazole) .... Take one by mouth daily 3)  Hydrochlorothiazide 25  Mg Tabs (Hydrochlorothiazide) .... Take one by mouth daily as needed 4)  Multi-vitamin Tabs (Multiple vitamin) .... Take by mouth as directed 5)  Zovirax Oint (Acyclovir oint) .... Use as directed 6)  Nasal Spray Saline Soln (Saline soln) .... Use as directed 7)  Lisinopril 20 Mg Tabs (Lisinopril) .Marland Kitchen.. 1 by mouth once daily 8)  Onetouch Ultrasmart W/device Kit (Blood glucose monitoring suppl) 9)  Levothyroxine Sodium 50 Mcg Tabs (Levothyroxine sodium) .Marland Kitchen.. 1 by mouth once daily 10)  Vytorin 10-20 Mg Tabs (Ezetimibe-simvastatin) .... Take one by mouth daily 11)  Onetouch Test Strp (Glucose blood) .... Check blood sugar once daily and as directed 12)  Omega 3 Fish Oil 1000 Mg  .Marland KitchenMarland Kitchen. 1 by mouth once daily 13)  Budeprion Xl 150 Mg Xr24h-tab (Bupropion hcl) .... Three tabs in am 14)  Effexor Xr 150 Mg Xr24h-cap (Venlafaxine hcl) .... Two in am 15)  Nystatin 100000 Unit/gm Crea (Nystatin) .... Apply to affected areas  once daily as needed rash 16)  Seroquel 50 Mg Tabs (Quetiapine fumarate) .... 1/2 by mouth at bedtimes as needed 17)  Alli 60mg  Caps  .... Take one capsule three times a day with meals 18)  Vitamin D 1000 Unit Tabs (Cholecalciferol) .... Take one daily 19)  Zyrtec Allergy 10 Mg Tabs (Cetirizine hcl) .Marland Kitchen.. 1 by mouth once daily as needed 20)  Flonase 50 Mcg/act Susp (Fluticasone propionate) .... 2 sprays in each nostril once daily 21)  Mentax 1 % Crea (Butenafine hcl) .... Apply to affected area once daily 22)  Vitamin B Complex-c Caps (B complex-c) .... Take 1 capsule by mouth once a day 23)  Augmentin 875-125 Mg Tabs (Amoxicillin-pot clavulanate) .Marland Kitchen.. 1 by mouth two times a day for 14 days  Patient Instructions: 1)  take the augmentin for sinus infection for 14 days 2)  you can try mucinex over the counter twice daily as directed and nasal saline spray for congestion 3)  tylenol over the counter as directed may help with aches, headache and fever 4)  call if symptoms worsen or if not  improved in 4-5 days  5)  increase your lisinopril from 10  to 20 mg once daily  6)  follow up with me in 2-3 weeks  Prescriptions: LISINOPRIL 20 MG TABS (LISINOPRIL) 1 by mouth once daily  #30 x 0   Entered and Authorized by:   Judith Part MD   Signed by:   Judith Part MD on 11/26/2010   Method used:   Electronically to        [id number] (retail)       [address]       [place], [place]  [address]       Ph: [phone] or [phone]       Fax: [phone]   RxID:   [id number] AUGMENTIN 875-125 MG TABS (AMOXICILLIN-POT CLAVULANATE) 1 by mouth two times a day for 14 days  #28 x 0   Entered and Authorized by:   Judith Part MD   Signed by:   Judith Part MD on 11/26/2010   Method used:   Electronically to        [facility] [phone]* (retail)       [address]       [place], [place]  [address]       Ph: [phone] or [phone]       Fax: [phone]   RxID:   [id number]    Orders Added: 1)  Prescription Created Electronically [G8553] 2)  Est. Patient Level IV [70623]    Current Allergies (reviewed today): ! * LAMICTAL

## 2010-12-07 ENCOUNTER — Other Ambulatory Visit: Payer: Self-pay | Admitting: Family Medicine

## 2010-12-07 DIAGNOSIS — Z1231 Encounter for screening mammogram for malignant neoplasm of breast: Secondary | ICD-10-CM

## 2010-12-16 ENCOUNTER — Ambulatory Visit: Payer: Medicare Other | Admitting: Family Medicine

## 2010-12-18 ENCOUNTER — Ambulatory Visit: Payer: Medicare Other | Admitting: Family Medicine

## 2010-12-21 ENCOUNTER — Encounter: Payer: Self-pay | Admitting: Family Medicine

## 2010-12-21 ENCOUNTER — Ambulatory Visit (INDEPENDENT_AMBULATORY_CARE_PROVIDER_SITE_OTHER): Payer: Medicare Other | Admitting: Family Medicine

## 2010-12-21 DIAGNOSIS — J069 Acute upper respiratory infection, unspecified: Secondary | ICD-10-CM | POA: Insufficient documentation

## 2010-12-21 DIAGNOSIS — M25559 Pain in unspecified hip: Secondary | ICD-10-CM | POA: Insufficient documentation

## 2010-12-21 DIAGNOSIS — M545 Low back pain: Secondary | ICD-10-CM

## 2010-12-21 DIAGNOSIS — I10 Essential (primary) hypertension: Secondary | ICD-10-CM

## 2010-12-21 IMAGING — CR DG SHOULDER 2+V*L*
3 series · 3 of 3 positions shown · non-contrast
Comparison: None

CLINICAL DATA: Shoulder pain

LEFT SHOULDER - 2+ VIEW

[w shoulder ap internal left]
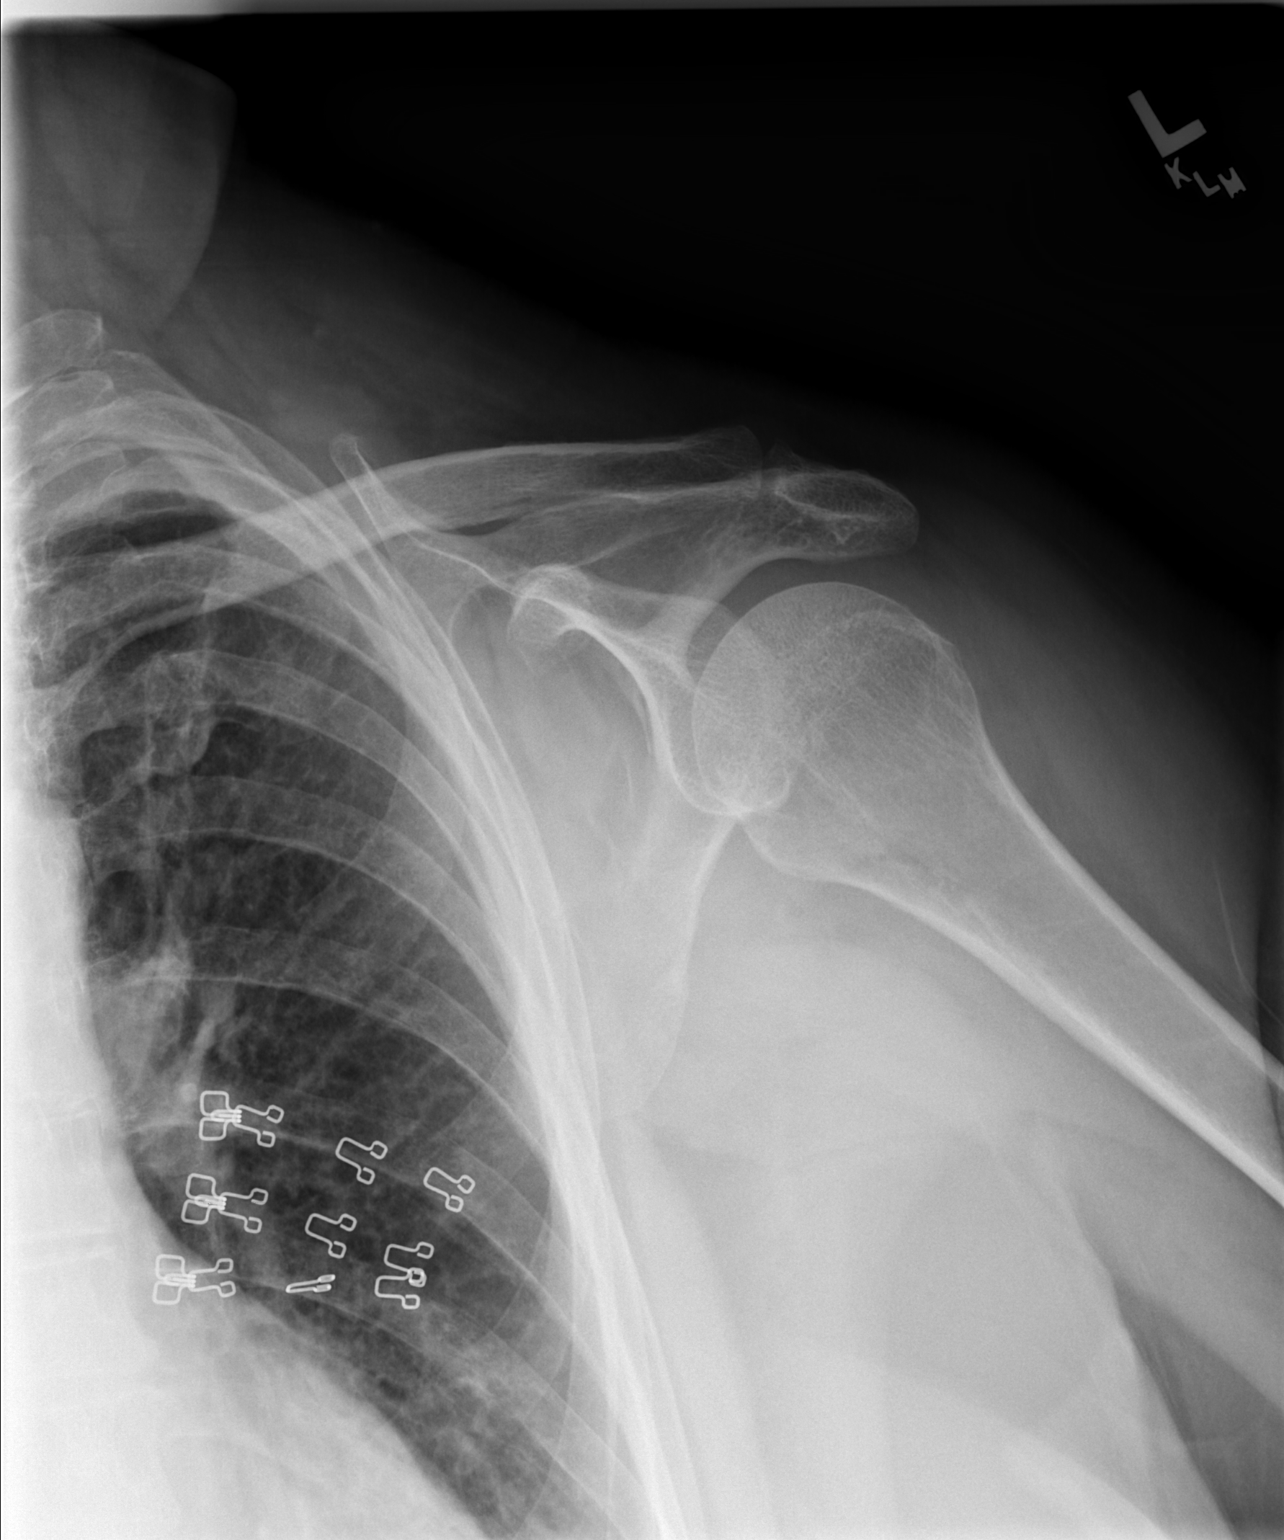

[w shoulder ap external left]
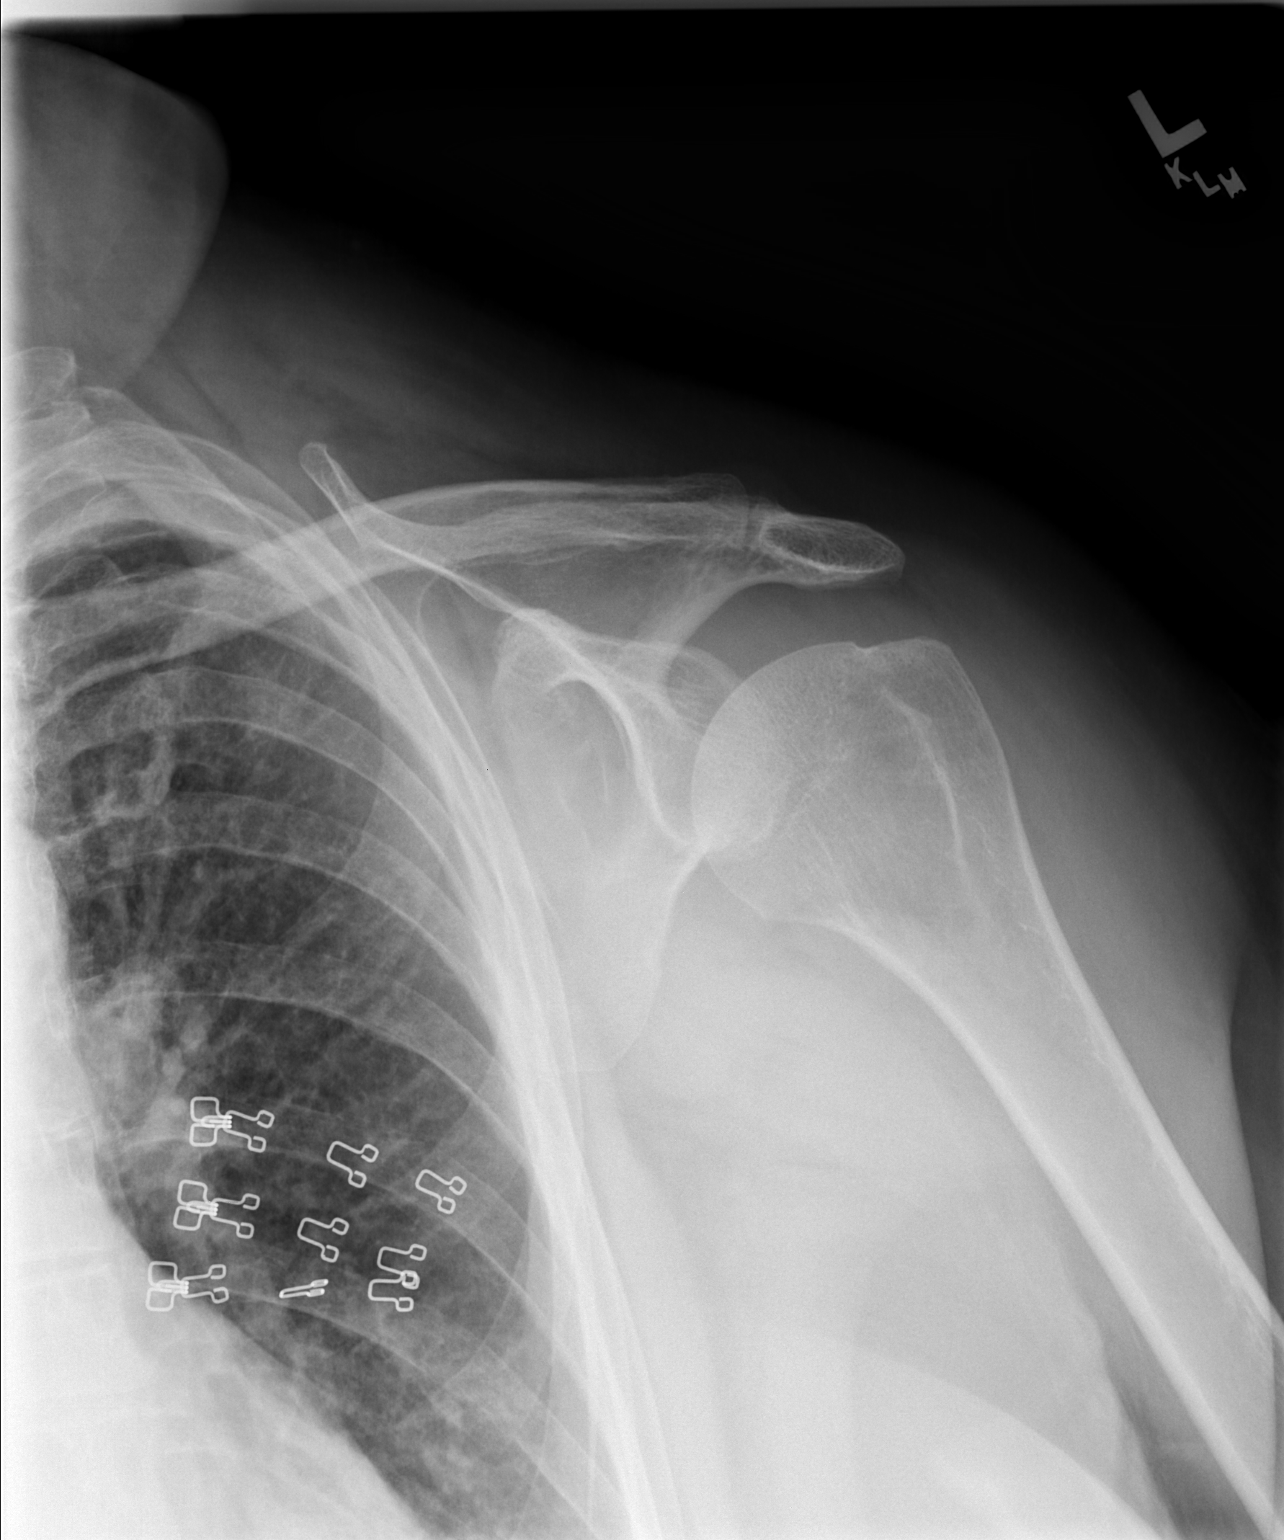

[w shoulder y view left]
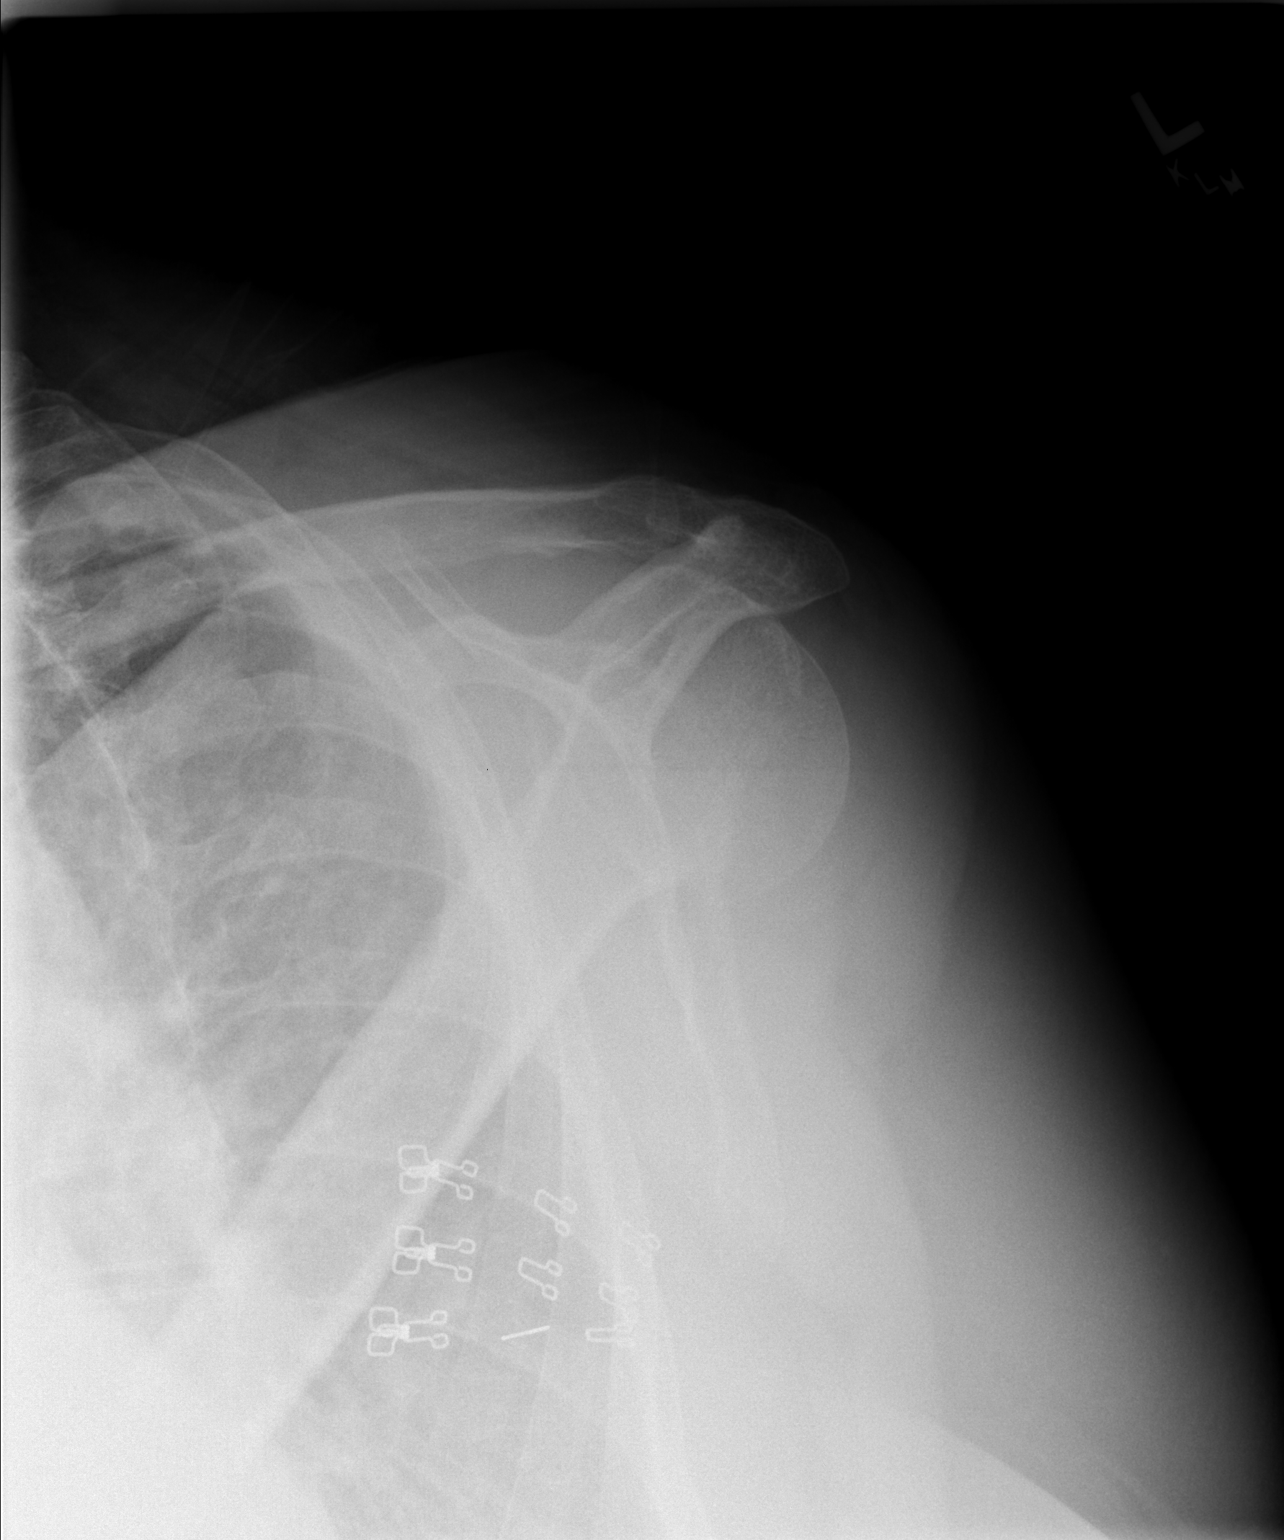

[3 of 3 positions shown; findings below may reference images not displayed]

FINDINGS: There is no evidence of fracture or dislocation.  There
is no evidence of arthropathy or other focal bone abnormality.
Soft tissues are unremarkable.
IMPRESSION: Normal exam.

## 2010-12-22 ENCOUNTER — Ambulatory Visit: Payer: Self-pay | Admitting: Family Medicine

## 2010-12-22 ENCOUNTER — Encounter: Payer: Self-pay | Admitting: Family Medicine

## 2010-12-22 ENCOUNTER — Telehealth: Payer: Self-pay | Admitting: Family Medicine

## 2010-12-22 DIAGNOSIS — M545 Low back pain: Secondary | ICD-10-CM | POA: Insufficient documentation

## 2010-12-24 ENCOUNTER — Ambulatory Visit: Payer: Commercial Indemnity

## 2010-12-31 NOTE — Assessment & Plan Note (Signed)
Summary: ??sinus infection/alc   Vital Signs:  Patient profile:   70 year old female Weight:      337.25 pounds BMI:     55.05 Temp:     98.1 degrees F oral Pulse rate:   84 / minute Pulse rhythm:   regular BP sitting:   140 / 82  (left arm) Cuff size:   large  Vitals Entered By: Selena Batten Dance CMA Duncan Dull) (December 21, 2010 12:36 PM) CC: Right leg pain   History of Present Illness: is here for R leg pain   has been unable to exercise with a sinus infection for a while   she had a slip and fall out of her bed -- used her R leg to get up and may have strained it  that has been 2 weeks ago  leg has been hurting 2 weeks    difficulty getting up from a low seat- has to hold on to something  hurts anterior thigh and now up into her hip area  is a dull ache  is fine when she is still  just hurts when she tries to get up  can walk ok  no swelling or redness   needs refil on inc dose of lisinopril  sinuses are finally getting better  still draining some clear mucous and blood  facial pain on and off - overall improved  some post nasal drip no fever   Allergies: 1)  ! * Lamictal  Past History:  Past Medical History: Last updated: 07/24/2008 Depression, mood disorder (? bipolar) Diabetes mellitus, type II Hyperlipidemia Hypertension Pulmonary embolism, hx of -multiple morbid obesity   psychiatry - Dr Koren Bound  counselor -- Micheline Chapman (sp?)  Past Surgical History: Last updated: 08/09/2007 Tubal ligation Tonsillectomy Right breast biopsy- benign Right foot fracture ? abnormal paps Endometrial biopsy attempted (08/2006) Pelvic US- gyn no ovaries  Family History: Last updated: 2008-04-27 Father: Deceased age 2 - MI Mother: deceased- pancreatic cancer w/ mets Siblings: 1 sister twin sons-- with hyperlipidemia son- gout   Social History: Last updated: 07/24/2008 Marital Status: widow--husb died with MI, DM Children: 4 sons Occupation: disability non  smoker  compulsive overeater   Risk Factors: Smoking Status: never (03/24/2007)  Review of Systems General:  Denies chills, fatigue, fever, loss of appetite, and malaise. Eyes:  Denies blurring and eye irritation. ENT:  Complains of postnasal drainage and sinus pressure; denies earache. CV:  Denies chest pain or discomfort, palpitations, and shortness of breath with exertion. Resp:  Denies cough, shortness of breath, sputum productive, and wheezing. GI:  Denies abdominal pain, change in bowel habits, indigestion, and nausea. GU:  Denies dysuria and urinary frequency. MS:  Complains of joint pain and stiffness; denies joint redness, joint swelling, cramps, and muscle weakness. Derm:  Denies itching, lesion(s), poor wound healing, and rash. Neuro:  Denies numbness and tingling. Psych:  Complains of anxiety and depression. Endo:  Denies cold intolerance, excessive thirst, excessive urination, and heat intolerance. Heme:  Denies abnormal bruising and bleeding.  Physical Exam  General:  morbidly obese and well appearing Head:  normocephalic, atraumatic, and no abnormalities observed.   Eyes:  vision grossly intact, pupils equal, pupils round, and pupils reactive to light.  no conjunctival pallor, injection or icterus  Ears:  R ear normal and L ear normal.   Nose:  mild congestion  Mouth:  pharynx pink and moist.   Neck:  supple with full rom and no masses or thyromegally, no JVD or carotid bruit  Chest Wall:  No deformities, masses, or tenderness noted. Lungs:  Normal respiratory effort, chest expands symmetrically. Lungs are clear to auscultation, no crackles or wheezes. Heart:  Normal rate and regular rhythm. S1 and S2 normal without gallop, murmur, click, rub or other extra sounds. Msk:  some tenderness of R greater trochanter (difficult to assess due to inability to get on table/obesity) no LS tenderness no groin tenderness nl bent knee raise some pain on int/ ext rot of R  hip gait mildly favors L leg   Pulses:  R and L carotid,radial,femoral,dorsalis pedis and posterior tibial pulses are full and equal bilaterally Extremities:  lymphedema noted - no pitting  no palp cords or tender areas in thigh or calf Neurologic:  sensation intact to light touch, gait normal, and DTRs symmetrical and normal.   baseline hand tremor  Skin:  Intact without suspicious lesions or rashes Psych:  anxious - baseline  not tearful today  Diabetes Management Exam:    Foot Exam (with socks and/or shoes not present):       Sensory-Pinprick/Light touch:          Left medial foot (L-4): normal          Left dorsal foot (L-5): normal          Left lateral foot (S-1): normal          Right medial foot (L-4): normal          Right dorsal foot (L-5): normal          Right lateral foot (S-1): normal       Sensory-Monofilament:          Left foot: normal          Right foot: normal       Inspection:          Left foot: normal          Right foot: normal       Nails:          Left foot: normal          Right foot: normal   Impression & Recommendations:  Problem # 1:  HIP PAIN, RIGHT (ICD-719.45) pain in R leg-- hip/ groin/ anterior thigh  this only occurs when getting up out of a chair without weakness or numbness (also started after a fall from bed) disc diff which incl injury/ OA hip/ bursitis and less likely radiculopathy sent for x rays of hip and also LS - will then adv further pt denied need for pain med for this as it does not hurt all the time she may benefit generally from a raised toilet seat  Orders: Radiology Referral (Radiology)  Problem # 2:  VIRAL URI (ICD-465.9) Assessment: New with congestion overall improving on its own  will update if facial pain or drainage inc or if no imp Her updated medication list for this problem includes:    Zyrtec Allergy 10 Mg Tabs (Cetirizine hcl) .Marland Kitchen... 1 by mouth once daily as needed  Complete Medication List: 1)   Clonazepam 1 Mg Tabs (Clonazepam) .... Take one by mouth three  times a day as needed 2)  Omeprazole 20 Mg Cpdr (Omeprazole) .... Take one by mouth daily 3)  Hydrochlorothiazide 25 Mg Tabs (Hydrochlorothiazide) .... Take one by mouth daily as needed 4)  Multi-vitamin Tabs (Multiple vitamin) .... Take by mouth as directed 5)  Zovirax Oint (Acyclovir oint) .... Use as directed 6)  Nasal Spray Saline Soln (Saline soln) .Marland KitchenMarland KitchenMarland Kitchen  Use as directed 7)  Lisinopril 20 Mg Tabs (Lisinopril) .Marland Kitchen.. 1 by mouth once daily 8)  Onetouch Ultrasmart W/device Kit (Blood glucose monitoring suppl) 9)  Levothyroxine Sodium 50 Mcg Tabs (Levothyroxine sodium) .Marland Kitchen.. 1 by mouth once daily 10)  Vytorin 10-20 Mg Tabs (Ezetimibe-simvastatin) .... Take one by mouth daily 11)  Onetouch Test Strp (Glucose blood) .... Check blood sugar once daily and as directed 12)  Omega 3 Fish Oil 1000 Mg  .Marland KitchenMarland Kitchen. 1 by mouth once daily 13)  Budeprion Xl 150 Mg Xr24h-tab (Bupropion hcl) .... Three tabs in am 14)  Effexor Xr 150 Mg Xr24h-cap (Venlafaxine hcl) .... Two in am 15)  Nystatin 100000 Unit/gm Crea (Nystatin) .... Apply to affected areas once daily as needed rash 16)  Seroquel 50 Mg Tabs (Quetiapine fumarate) .... 1/2 by mouth at bedtimes as needed 17)  Alli 60mg  Caps  .... Take one capsule three times a day with meals 18)  Vitamin D 1000 Unit Tabs (Cholecalciferol) .... Take one daily 19)  Zyrtec Allergy 10 Mg Tabs (Cetirizine hcl) .Marland Kitchen.. 1 by mouth once daily as needed 20)  Flonase 50 Mcg/act Susp (Fluticasone propionate) .... 2 sprays in each nostril once daily 21)  Mentax 1 % Crea (Butenafine hcl) .... Apply to affected area once daily 22)  Vitamin B Complex-c Caps (B complex-c) .... Take 1 capsule by mouth once a day  Other Orders: Prescription Created Electronically [id number])  Patient Instructions: 1)  we will arrange x ray referral at check out  2)  try gentle heat on area that hurts 10 minutes at a time  3)  if symtoms worsen in the  meantime - let me know but I will update you when I get results  Prescriptions: LISINOPRIL 20 MG TABS (LISINOPRIL) 1 by mouth once daily  #30 x 11   Entered and Authorized by:   Judith Part MD   Signed by:   Judith Part MD on 12/21/2010   Method used:   Electronically to        [facility] [phone]* (retail)       [address]       [place], [place]  [address]       Ph: [phone] or [phone]       Fax: [phone]   RxID:   [id number]    Orders Added: 1)  Radiology Referral [Radiology] 2)  Prescription Created Electronically [[id number]] 3)  Est. Patient Level IV [59563]    Current Allergies (reviewed today): ! * LAMICTAL

## 2010-12-31 NOTE — Progress Notes (Signed)
  Phone Note From Other Clinic   Summary of Call: need dx for LS films  Initial call taken by: Judith Part MD,  December 22, 2010 12:32 PM  Follow-up for Phone Call        can use low back/ buttock pain  Follow-up by: Judith Part MD,  December 22, 2010 12:33 PM  New Problems: BACK PAIN, LUMBAR (ICD-724.2)   New Problems: BACK PAIN, LUMBAR (ICD-724.2)

## 2011-01-11 ENCOUNTER — Telehealth: Payer: Self-pay | Admitting: Family Medicine

## 2011-01-21 NOTE — Progress Notes (Signed)
Summary: lisinopril  Phone Note Refill Request Message from:  [employer] on January 11, 2011 9:47 AM  Refills Requested: Medication #1:  Lisinpril 10mg    Supply Requested: 1 year   Notes: 20mg  in chart [facility]    Method Requested: Electronic Initial call taken by: Benny Lennert CMA Duncan Dull),  January 11, 2011 9:48 AM  Follow-up for Phone Call        did we just do refil in feb ?  Follow-up by: Judith Part MD,  January 11, 2011 11:17 AM  Additional Follow-up for Phone Call Additional follow up Details #1::        Yes and I called [facility] and they did receive rx on 12/21/10 so disrregard this refill request.Rena [facility] LPN  January 11, 2011 11:42 AM

## 2011-02-05 ENCOUNTER — Other Ambulatory Visit: Payer: Self-pay | Admitting: *Deleted

## 2011-02-05 MED ORDER — LEVOTHYROXINE SODIUM 50 MCG PO TABS: 50.0000 ug | ORAL_TABLET | Freq: Every day | ORAL | Status: DC

## 2011-03-05 ENCOUNTER — Other Ambulatory Visit: Payer: Self-pay | Admitting: Family Medicine

## 2011-03-05 NOTE — Telephone Encounter (Signed)
Pt needs to call office for appt

## 2011-03-09 NOTE — Assessment & Plan Note (Signed)
Theresa Blake, Theresa Blake               ACCOUNT NO.:  [id number]   MEDICAL RECORD NO.:  [id number]          PATIENT TYPE:  POB   LOCATION:  [facility]         FACILITY:  [facility]   PHYSICIAN:  Johnella Moloney, MD        DATE OF BIRTH:  16-Nov-1940   DATE OF SERVICE:  08/29/2007                                  CLINIC NOTE   CHIEF COMPLAINT:  Annual examination, urinary symptoms.   HISTORY OF PRESENT ILLNESS:  The patient is a 70 year old post-  menopausal G3, P4 who was here for her annual exam and also reports some  urinary symptoms.  According to the patient, about 2 weeks ago, she  started feeling pressure and a funny sensation when she urinated.  She  denies any dysuria or burning but just reports a tingling, almost  stinging occasionally and this weird sensation tingling up her nerves  into her chest on 2 episodes.  She has also noted 1 episode of blood in  her urine and she quantified this to be about 1/8 of a teaspoon.  She  was seen by her primary care physician and urinalysis, which was done,  was negative, according to her.  Her doctor told her to lay off spicy  foods, citrus and caffeine.  She has not had any further episodes but  she is very concerned about this funny sensation.  The patient,  otherwise, has no GYN concerns.   REVIEW OF SYSTEMS:  The patient reports swelling in her legs, body  aches, headaches, problems hearing, shortness of breath, problems with  urination, rare hot flashes and all of these problems are followed by  her primary care physician.   PAST OB HISTORY:  G3, P3-0-0-4 with 4 previous vaginal deliveries.  The  patient is now menopausal and was previously on hormone replacement  therapy until she developed bilateral pulmonary emboli.   PAST MEDICAL HISTORY:  1. Hypertension.  2. Thyroid disease.  3. Diabetes.  4. Hypercholesterolemia.  5. Depression.  6. Pulmonary embolism.   PAST SURGICAL HISTORY:  1. Tubal ligation.  2. Biopsy of right  breast.  3. Dilation and curettage for post-menopausal bleeding at age 70 and      also at age 89.  Her most recent dilation and curettage October 10, 2006 revealed fragments of benign endometrial polyps and      indication was for post-menopausal bleeding.   MEDICATIONS:  1. Clonazepam 1 mg p.o. t.i.d.  2. Omeprazole 20 mg daily.  3. Synthroid 0.25 mg daily.  4. Hydrochlorothiazide 25 mg p.o. daily.  5. Vytorin 10/20 mg p.o. daily.  6. Wellbutrin XL 300 mg q. a.m.  7. Citalopram 300 mg daily.  8. Seroquel 200 mg q. h.s.  9. Lisinopril 5 mg daily.  10.Multivitamin daily.  11.Bactroban 2% p.r.n.   ALLERGIES:  No known drug allergies.   SOCIAL HISTORY:  The patient denies any tobacco, drugs or alcohol use.   FAMILY HISTORY:  Positive for diabetes, coronary artery disease,  pancreatic cancer and hypertension.   PHYSICAL EXAMINATION:  VITAL SIGNS:  Pulse is 96, blood pressure  146/107, weight 324 lb, height 5 ft,  7-1/2 in.  GENERAL:  No apparent distress.  LUNGS:  Clear to auscultation bilaterally.  CARDIOVASCULAR:   Regular rate and rhythm.  ABDOMEN:  Soft, nontender, nondistended., morbidly obese.  PELVIC:  Normal external female genitalia.  Vagina is mildly atrophic  with some rugae.  Cervix is very posterior and deep.  Small post-  menopausal Pap sample obtained.  Uterus could not be palpated.  Adnexa  also could not be palpated secondary to body habitus.  Of note, the  speculum examination was very difficult.  The longest speculum in the  office was used and the cervix was visualized; however, it was very  difficult to obtain Pap smear samples.   ASSESSMENT/PLAN:  The patient is a 70 year old here for her annual exam,  also reports urinary symptoms.  As for her annual exam, the patient was  scheduled for a mammogram.  Her last mammogram was 2 years ago.  The  patient will be scheduled for a colonoscopy by her primary care  physician.  Will follow up the results of  the Pap smear.  If the Pap  smear results do come back inadequate, will consider doing a Pap smear  in the operating room if needed.  As for her urinary symptoms, another  urinalysis and urine culture were sent.  There was not evidence of  bleeding noted in her vaginal vault, so the bleeding is likely to be  hematuria as the patient reported.  The patient was told that she will  be called with the results and told to follow up with the clinic or her  primary care physician with any further symptoms.           ______________________________  Johnella Moloney, MD     UD/MEDQ  D:  08/29/2007  T:  08/30/2007  Job:  [id number]

## 2011-03-12 NOTE — Op Note (Signed)
NAMEJAELLE, CAMPANILE               ACCOUNT NO.:  [id number]   MEDICAL RECORD NO.:  [id number]          PATIENT TYPE:  AMB   LOCATION:  [facility]                           FACILITY:  [facility]   PHYSICIAN:  Tanya S. Shawnie Pons, M.D.   DATE OF BIRTH:  1941/08/23   DATE OF PROCEDURE:  10/10/2006  DATE OF DISCHARGE:                               OPERATIVE REPORT   PREOPERATIVE DIAGNOSIS:  Postmenopausal bleeding.   POSTOPERATIVE DIAGNOSIS:  Postmenopausal bleeding.   PROCEDURE:  Diagnostic dilatation and curettage.   SURGEON:  Shelbie Proctor. Shawnie Pons, M.D.   ASSISTANT:  None.   ANESTHESIA:  MAC and local.   ASSESSMENT:  Uterine contents to pathology.   ESTIMATED BLOOD LOSS:  Minimal.   COMPLICATIONS:  None.   REASON FOR PROCEDURE:  Patient is a 70 year old postmenopausal female  with multiple medical problems who has postmenopausal bleeding.  __________ endometrial sampling.  An ultrasound revealed an abnormal-  appearing endometrial stripe that was widened at 1 x 1 cm with an  echogenic mass.  The patient is scheduled for a D&C.   PROCEDURE:  Patient was seen in the OR.  She was placed in dorsal  lithotomy and Allen's stirrups.  She was prepped and draped in the usual  sterile fashion.  SCDs are used on her legs, and she has a history of  PE.  A speculum was placed inside the vagina, and the cervix was  visualized.  Several different speculums had to be used in order to get  the appropriate length.  The cervix was visualized, grasped anteriorly  with the single-tooth tenaculum, and injected with 10 cc of 1% lidocaine  with epinephrine.  A os-finder was then used to dilate the cervix, and  then the uterus was sounded to approximately 8 cm.  Sequential dilation  was then performed easily, and a curette used to obtain tissue.  There  was a gritty surface all around except anteriorly.  It was difficult to  obtain tissue from the patient.  A polyp remover was finally used to  obtain several chunks of  tissue, which in addition to what was  removed in the vagina on a Telfa pad, was sent to pathology.  The single-  tooth tenaculum was then removed from the cervix.  There was minimal  bleeding noted.  The speculum was removed from the vagina.  All  instrument, needle, and lap counts correct x2.  The patient was awakened  and taken to the recovery room in stable condition.           ______________________________  Shelbie Proctor Shawnie Pons, M.D.     TSP/MEDQ  D:  10/10/2006  T:  10/10/2006  Job:  [id number]

## 2011-03-12 NOTE — H&P (Signed)
NAME:  Theresa Blake, Theresa Blake               ACCOUNT NO.:  [id number]   MEDICAL RECORD NO.:  [id number]          PATIENT TYPE:  POB   LOCATION:  [facility]                   FACILITY:  [facility]   PHYSICIAN:  Tinnie Gens, MD        DATE OF BIRTH:  10/23/1941   DATE OF SERVICE:                           PRE-OP HISTORY & PHYSICAL   HISTORY OF PRESENT ILLNESS:  The patient is a 70 year old postmenopausal  female with multiple medical problems, seen by Dr. Roxy Manns for  postmenopausal bleeding, had a failed endometrial biopsy in the office.  The patient has undergone ultrasound screening which revealed an  abnormal-appearing endometrial canal that is widened at 1 x 1 cm with  echogenic mass.  The patient is scheduled for [facility] for diagnostic  purposes.   PAST MEDICAL HISTORY:  1. Hypertension.  2. Thyroid disease.  3. Diabetes mellitus.  4. Hypercholesterolemia.  5. Depression.  6. History of pulmonary embolism.   PAST SURGICAL HISTORY:  1. Tubal ligation.  2. Biopsy of right breast.  3. Dilation and curettage for postmenopausal bleeding.  This revealed      polyps at age 67.   MEDICATIONS:  1. Wellbutrin XL 150 q.a.m.  2. Klonopin 1 mg 3 times a day.  3. Effexor 150 mg 3 times daily.  4. Xalatan 10 mg nightly.  5. Omeprazole 20 mg daily.  6. Synthroid 0.025 mg daily.  7. Hydrochlorothiazide 25 mg daily.  8. Viperine 10/20 mg daily.  9. Multivitamins daily.  10.Bactroban 2% as needed.  11.Zoline HC drops for her ears.   OB HISTORY:  She is a G3, P3-0-0-4, who had 4 previous vaginal  deliveries, completely menopausal.  Was on hormone replacement therapy  postmenopausal until she developed bilateral pulmonary emboli.   FAMILY HISTORY:  Diabetes, coronary artery disease hypertension, cancer,  pancreatic cancer.   SOCIAL HISTORY:  No tobacco or hard drug use.   REVIEW OF SYSTEMS:  A 14-point Review of Systems is reviewed diffusely  positive for swelling in her legs, back aches,  muscles aches, weight  gain, headache, vertigo, problems hearing, problems breathing, shortness  of breath, problems with urination, loss of urine, hot flashes, vaginal  itching.  Most of this is followed by her primary care physician.   PHYSICAL EXAMINATION:  GENERAL: Obese female in no acute distress.  VITAL SIGNS:.  Weight 326 pounds.  Blood pressure 149/105.  HEENT: Normocephalic and atraumatic.  Sclerae anicteric.  LUNGS:  Clear.  CARDIOVASCULAR:  Regular rate and rhythm without murmurs, rubs, or  gallops.  ABDOMEN:  Soft, nontender, nondistended.  Morbidly obese.  GU: Normal external female genitalia.  Satellite lesions in the  intertriginis zone, right greater than left.  Vagina is pink __________  appearing.  Cervix is posterior, small, postmenopausal.  The uterus  could not be easily palpated, does not feel excessively enlarged.  Adnexa could not really appreciate either secondary to body habitus.   IMPRESSION:  1. Postmenopausal bleeding.  2. Abnormal pelvic ultrasound.  3. Inability to obtain endometrial biopsy in the office.   PLAN:  Diagnostic dilation and curettage.  ______________________________  Tinnie Gens, MD     TP/MEDQ  D:  10/04/2006  T:  10/04/2006  Job:  [id number]

## 2011-03-12 NOTE — Assessment & Plan Note (Signed)
Theresa Blake, BRESLIN               ACCOUNT NO.:  [id number]   MEDICAL RECORD NO.:  [id number]          PATIENT TYPE:  POB   LOCATION:  [facility] at [facility]         FACILITY:  [facility]   PHYSICIAN:  Tinnie Gens, MD        DATE OF BIRTH:  23-Jan-1941   DATE OF SERVICE:  08/30/2006                                    CLINIC NOTE   CHIEF COMPLAINT:  Question post-menopausal bleeding.   HISTORY OF PRESENT ILLNESS:  The patient is a 70 year old post-menopausal  obese female with multiple medical problems who is referred by Dr. Kerby Blake, who is seeing the patient for Dr. Jorge Blake.  The patient  apparently went in there with some urinary frequency and urgency and had  also noticed some bleeding prior to going in to that doctor's office.  The  patient has a family history of endometrial carcinoma.  She did have a  rectal, which revealed negative guaiac stool so she is referred here for  workup and endometrial biopsy for post-menopausal bleeding.   PAST MEDICAL HISTORY:  Significant for:  1. Hypertension.  2. Thyroid disease.  3. Diabetes.  4. High cholesterol.  5. Depression.  6. Pulmonary embolism.   PAST SURGICAL HISTORY:  1. Tubal ligation.  2. Biopsy of right breast lump.  3. Dilation and curettage for post-menopausal bleeding, which revealed      polyps at age 10.   MEDICATIONS:  1. Wellbutrin XL 100 mg daily.  2. Klonopin 1 mg three times a day.  3. Effexor XR 150 mg, three tablets daily.  4. Zolpidem 10 mg at bedtime.  5. Omeprazole 20 mg daily.  6. Synthroid 0.025 mg daily.  7. HCTZ 25 mg daily.  8. Vytorin 10/20 mg daily.  9. Multivitamin daily.  10.Bactroban 2% as needed.  11.Zolene HC for her ears.   OB HISTORY:  She is a G3, P3, 0-0-4 who has a history of four previous  vaginal deliveries.  She completed menopause at age 49, was on post-  menopausal hormones for approximately 4 years until she developed bilateral  pulmonary embolism.   FAMILY HISTORY:   Significant for:  1. Diabetes.  2. Coronary artery disease.  3. Hypertension.  4. Endometrial cancer.  5. Pancreatic cancer.   SOCIAL HISTORY:  She lives with her son.  She denies tobacco, alcohol or  drug use.   REVIEW OF SYSTEMS:  A 14-part review of systems are reviewed.  Diffusely  positive.  Please see GYN history on the chart.  Significant for bruising,  swelling in her legs, especially her feet, backaches, muscle aches, weight  gain, headaches, vertigo, problems with hearing, problems with breathing and  shortness of breath, problems with urination, loss of urine especially with  urgency.  When she needs to go to the bathroom she has trouble making it all  the way there.  She reported occasional loss with coughing and sneezing but  that is not her primary problem.  Hot flashes in the past and some vaginal  itching lately, especially around her inner thighs.   PHYSICAL EXAMINATION:  GENERAL:  On exam today, she is an obese female in no  acute distress.  VITAL SIGNS:  Blood pressure is 111/82, weight is 323 pounds, pulse is 113.  GENITOURINARY:  External genitalia has erythema with satellite lesions in  the intertriginal zone, right greater than left.  Otherwise external  genitalia is normal.  The vagina is pink and looks actually pretty well  estrogenized at present.  The cervix is very posterior and small,  postmenopausal.  The uterus could not really be palpated but does not feel  excessively enlarged.   PROCEDURE:  Attempt was made for endometrial biopsy.  The cervix was  visualized, grasped anteriorly.  The cervix was cleaned with Betadine and an  attempt was made to sound the uterus; however, cervix was exceptionally  stenotic and sampling could not be undertaken. After exam, __________ ,  Skene's glands were normal.   IMPRESSION:  1. Question of post-menopausal bleeding.  2. Obesity.  3. Hypertension.  4. Diabetes.  5. Hypothyroidism.  6. Depression and  anxiety.  7. Hypercholesterolemia.  8. Gastroesophageal reflux disease.  9. Incontinence, probably urgency related.   PLAN:  1. After a lengthy conversation was had with the patient, it was not clear      to me that she had actual post-menopausal bleeding.  There certainly      was no blood visible in the vaginal vault and the cervix could not be      penetrated easily.  Endometrial biopsy was not possible in the office.      She does not seem to be a great surgical candidate at this point but      would undergo endometrial sampling if pelvic sonogram reveals the      uterus or endometrial lining to be enlarged.  Plan will include pelvic      sonogram and followup after this in 3-4 weeks here in the office.  2. Urinary incontinence, probably urge related.  Will do a trial of      Ditropan.   Thank you so much for referring this patient.  Will keep you abreast of her  workup and treatment options.           ______________________________  Tinnie Gens, MD     TP/MEDQ  D:  08/30/2006  T:  08/31/2006  Job:  [id number]

## 2011-03-12 NOTE — Assessment & Plan Note (Signed)
Theresa Blake, WINKELS               ACCOUNT NO.:  [id number]   MEDICAL RECORD NO.:  [id number]          PATIENT TYPE:  POB   LOCATION:  [facility]         FACILITY:  [facility]   PHYSICIAN:  Tinnie Gens, MD        DATE OF BIRTH:  12/22/1940   DATE OF SERVICE:  09/20/2006                                    CLINIC NOTE   CHIEF COMPLAINT:  Polyp.   HISTORY OF PRESENT ILLNESS:  The patient is a 70 year old, morbidly obese  female, who has postmenopausal bleeding.  She was previously by Dr. Kerby Nora and Dr. Roxy Manns.  At that time we attempted endometrial  sampling, which could not be done.  The patient was then referred for a  transvaginal ultrasound.  These results are reviewed today.  The uterus was  approximately 6.2 x 2.8 x 4.3 cm.  There was an echogenic mass extruding  into the endometrial canal measuring 1.0 x 1.0 cm.  The rest of the  endometrial canal was normal.  The ovaries could not be appreciated.  It was  felt this most likely represented a polyp.  The patient does have a history  of a D&C with a finding of endometrial benign polyps at age 74.   IMPRESSION:  1. Postmenopausal bleeding.  2. Abnormal pelvic sonogram with findings consistent with polyp.   PLAN:  Dilation and curettage for diagnostic and possible treatment  purposes.  We will get the patient posted as soon as possible.  Again thank  you for allowing [place] to participate in the care of your patient.  We will  keep you updated as to her progress.           ______________________________  Tinnie Gens, MD     TP/MEDQ  D:  09/20/2006  T:  09/21/2006  Job:  [id number]

## 2011-03-15 ENCOUNTER — Telehealth: Payer: Self-pay | Admitting: *Deleted

## 2011-03-15 ENCOUNTER — Encounter: Payer: Self-pay | Admitting: Family Medicine

## 2011-03-15 DIAGNOSIS — M25559 Pain in unspecified hip: Secondary | ICD-10-CM

## 2011-03-15 DIAGNOSIS — Z1231 Encounter for screening mammogram for malignant neoplasm of breast: Secondary | ICD-10-CM

## 2011-03-15 DIAGNOSIS — M545 Low back pain: Secondary | ICD-10-CM

## 2011-03-15 NOTE — Telephone Encounter (Signed)
Patient is asking if she can get referral for her yearly mammogram, she says that she has always gone to [facility], but wants to go to [place] for this. She is also asking if she can get referral to see orthopedic dr. Please advise.

## 2011-03-15 NOTE — Telephone Encounter (Signed)
Ok -will do referrals for [employer]

## 2011-03-15 NOTE — Telephone Encounter (Signed)
Which diagnosis for orthopedics?-thanks

## 2011-03-15 NOTE — Telephone Encounter (Signed)
No answer and no v/m will try another time.

## 2011-03-15 NOTE — Telephone Encounter (Signed)
Patient says that

## 2011-03-15 NOTE — Telephone Encounter (Signed)
Patient says that if she sits for a long period of time she can barely get up, and when she finally does get up, she has a lot of pain in her hip and lower back. She says that her "bones are weak". She also says that she really wants to lose wait, but she can't exercise because she hurt too much.

## 2011-03-16 ENCOUNTER — Ambulatory Visit: Payer: Commercial Indemnity | Admitting: Family Medicine

## 2011-03-18 NOTE — Telephone Encounter (Signed)
MMG at [facility] on 04/13/2011 at 12pm .  Orthop appt made w/ Leana Roe Printer on May 30th at 9am.

## 2011-03-24 ENCOUNTER — Ambulatory Visit: Payer: Medicare Other | Admitting: Family Medicine

## 2011-03-24 DIAGNOSIS — Z0289 Encounter for other administrative examinations: Secondary | ICD-10-CM

## 2011-03-26 ENCOUNTER — Ambulatory Visit: Payer: Commercial Indemnity | Admitting: Family Medicine

## 2011-03-29 ENCOUNTER — Other Ambulatory Visit: Payer: Self-pay

## 2011-03-29 MED ORDER — LEVOTHYROXINE SODIUM 50 MCG PO TABS: 50.0000 ug | ORAL_TABLET | Freq: Every day | ORAL | Status: DC

## 2011-03-29 NOTE — Telephone Encounter (Signed)
[facility] faxed refill request for Levothyroxine #90. Pt already has scheduled CPX on 06/02/11.

## 2011-05-08 ENCOUNTER — Other Ambulatory Visit: Payer: Self-pay | Admitting: Family Medicine

## 2011-05-10 NOTE — Telephone Encounter (Signed)
[facility] request refill on Nystatin cream.Pt last seen 12/21/10.ans.

## 2011-05-10 NOTE — Telephone Encounter (Signed)
Will send electronically.

## 2011-05-20 ENCOUNTER — Telehealth: Payer: Self-pay | Admitting: Family Medicine

## 2011-05-20 ENCOUNTER — Other Ambulatory Visit: Payer: Self-pay | Admitting: Family Medicine

## 2011-05-20 DIAGNOSIS — I10 Essential (primary) hypertension: Secondary | ICD-10-CM

## 2011-05-20 DIAGNOSIS — E039 Hypothyroidism, unspecified: Secondary | ICD-10-CM

## 2011-05-20 DIAGNOSIS — E785 Hyperlipidemia, unspecified: Secondary | ICD-10-CM

## 2011-05-20 DIAGNOSIS — E119 Type 2 diabetes mellitus without complications: Secondary | ICD-10-CM

## 2011-05-20 NOTE — Telephone Encounter (Signed)
Message copied by Judy Pimple on Thu May 20, 2011  9:29 PM ------      Message from: Dianne Dun      Created: Thu May 20, 2011  6:03 PM                   ----- Message -----         From: Mills Koller         Sent: 05/20/2011   3:45 PM           To: Ruthe Mannan, MD            Patient is scheduled for Wednesday CPX labs, please order future labs, Thanks , Camelia Eng

## 2011-05-26 ENCOUNTER — Other Ambulatory Visit: Payer: Commercial Indemnity

## 2011-06-02 ENCOUNTER — Encounter: Payer: Commercial Indemnity | Admitting: Family Medicine

## 2011-06-08 ENCOUNTER — Other Ambulatory Visit: Payer: Self-pay | Admitting: Family Medicine

## 2011-06-09 ENCOUNTER — Other Ambulatory Visit: Payer: Self-pay

## 2011-06-09 NOTE — Telephone Encounter (Signed)
Already sent phone note to Dr Milinda Antis because of another phone message sent by [facility]............Theresa Blake

## 2011-06-09 NOTE — Telephone Encounter (Signed)
[facility] request refill for HCTZ 25mg . Spoke with pt and she said she normally takes as needed but for the past month pt has been eating food with more salt in it. Pt said for 1 month she has been taking HCTZ daily. Pt has appt to see Dr Sharen Hones 06/10/11 for back pain, vertigo and feet swelling. Pt has appt with Dr Milinda Antis scheduled for CPX on 08/18/11. Pt is out of med completely and [facility] [place].  Advised pt to watch salt intake also.Is it ok to refill HCTZ?

## 2011-06-09 NOTE — Telephone Encounter (Signed)
Was another phone note sent to Dr Milinda Antis re; HCTZ refill and Dr Milinda Antis said #03 x3.

## 2011-06-09 NOTE — Telephone Encounter (Signed)
Pt need Rx for her fluid pill. [facility] [place], [place] [phone], says pharmacy has sent a request to the office. No response from the office...cdavis

## 2011-06-09 NOTE — Telephone Encounter (Signed)
HCTZ was called in #30 x 3 refills; not #03.

## 2011-06-09 NOTE — Telephone Encounter (Signed)
There was a pending electronic refill request so approved for #30 x 3 refills and sent electronically to [facility] as instructed.

## 2011-06-09 NOTE — Telephone Encounter (Signed)
Yes - I tried to send electronically but computer said it was already pending?  Can have 30 with 3 ref  thanks

## 2011-06-10 ENCOUNTER — Encounter: Payer: Self-pay | Admitting: Family Medicine

## 2011-06-10 ENCOUNTER — Ambulatory Visit (INDEPENDENT_AMBULATORY_CARE_PROVIDER_SITE_OTHER): Payer: Medicare Other | Admitting: Family Medicine

## 2011-06-10 VITALS — BP 140/78 | HR 84 | Temp 98.3°F | Wt 332.5 lb

## 2011-06-10 DIAGNOSIS — M545 Low back pain: Secondary | ICD-10-CM

## 2011-06-10 MED ORDER — NAPROXEN 500 MG PO TABS: ORAL_TABLET | ORAL | Status: AC

## 2011-06-10 NOTE — Patient Instructions (Addendum)
We will try anti inflammatories (sent naprosyn to your pharmacy). Take one twice daily as needed for pain in back, with food. If stomach upset with medicine, stop and let us know. If worsening, or not improving as expected, let us know. Use stretching/strengthening exercises provided today.

## 2011-06-10 NOTE — Progress Notes (Signed)
  Subjective:    Patient ID: Theresa Blake, female    DOB: 1941-10-24, 70 y.o.   MRN: [id number]  HPI CC: back pain  Pt new to me established of practice presents with back pain.  States has had back pain for last 6 mo, wants ortho referral.  Gets back pain easily.  End of June at beach when lounge chair she was sitting on fell apart and hit lower back.  Since then worsening.  Has tried icy hot on back.  Also tried BC powders.  Tried tylenol in past.  Back improves with certain positions.  Back pain in middle lumbar to mid thoracic, no shooting pain down legs or radiation.  No fevers/chills, nausea, leg numbness or weakness.  Had xrays done at [facility] 11/2010 - hip and LS spine.  Reviewed report.  Hip normal, LS spine with L5/S1 DDD otherwise ok.  Has 2 story house, bedrooms all upstairs. Lately having trouble getting all the way up to 2nd floor (last few steps).  Knows she's overweight, doesn't know what exercises she can do to help.  Has been riding indoor bike, but afraid to worsen pain.  Did not discuss dizziness today.  Review of Systems per HPI    Objective:   Physical Exam  Nursing note and vitals reviewed. Constitutional: She appears well-developed and well-nourished. No distress.       obese  Musculoskeletal:       Obese. No pain midline spine Neg SLR bilat No pain w int/ext rotation at hip Somewhat stiff movements  Neurological:       DTRs dim throughout  Skin: Skin is warm and dry. No rash noted.  Psychiatric:       Nervous, anxious during exam, states gets anxiety with doctor visits          Assessment & Plan:

## 2011-06-10 NOTE — Assessment & Plan Note (Addendum)
Recently worsening, but no pain on exam today.   Anticipate osteoarthritis, doubt fracture or other etiology as no point tenderness, no tenderness today. Recommended NSAIDs, stretching exercises to start in bed, recommended stay as active as possible with walking/biking. To notify us if not improving. Pt agrees with plan, agrees to try NSAIDs prior to ortho referral.

## 2011-07-07 ENCOUNTER — Other Ambulatory Visit: Payer: Self-pay | Admitting: Family Medicine

## 2011-07-07 NOTE — Telephone Encounter (Signed)
[facility] Levothyroxine 50 mcg # 90 x 0. Pt already scheduled CPX Dr Milinda Antis 08/18/11.

## 2011-08-18 ENCOUNTER — Encounter: Payer: Commercial Indemnity | Admitting: Family Medicine

## 2011-08-19 ENCOUNTER — Other Ambulatory Visit: Payer: Self-pay | Admitting: Family Medicine

## 2011-08-24 ENCOUNTER — Ambulatory Visit: Payer: Medicare Other

## 2011-10-02 ENCOUNTER — Other Ambulatory Visit: Payer: Self-pay | Admitting: Family Medicine

## 2011-10-04 NOTE — Telephone Encounter (Signed)
Medication phoned to [facility] pharmacy as instructed.

## 2011-10-04 NOTE — Telephone Encounter (Signed)
Looks like she had PE in oct? Am I wrong Ok to fill

## 2011-10-04 NOTE — Telephone Encounter (Signed)
Patient not seen for cpx in over 1 year ok to fill?

## 2011-10-12 ENCOUNTER — Other Ambulatory Visit: Payer: Self-pay | Admitting: Internal Medicine

## 2011-10-12 MED ORDER — LEVOTHYROXINE SODIUM 50 MCG PO TABS: 50.0000 ug | ORAL_TABLET | Freq: Every day | ORAL | Status: DC

## 2011-10-12 NOTE — Telephone Encounter (Signed)
Rx sent to pharmacy   

## 2011-10-13 ENCOUNTER — Encounter: Payer: Self-pay | Admitting: Family Medicine

## 2011-10-13 ENCOUNTER — Ambulatory Visit (INDEPENDENT_AMBULATORY_CARE_PROVIDER_SITE_OTHER): Payer: Medicare Other | Admitting: Family Medicine

## 2011-10-13 ENCOUNTER — Other Ambulatory Visit: Payer: Self-pay | Admitting: Family Medicine

## 2011-10-13 VITALS — BP 136/82 | HR 104 | Temp 98.2°F | Wt 350.2 lb

## 2011-10-13 DIAGNOSIS — J329 Chronic sinusitis, unspecified: Secondary | ICD-10-CM

## 2011-10-13 DIAGNOSIS — R42 Dizziness and giddiness: Secondary | ICD-10-CM

## 2011-10-13 DIAGNOSIS — L03119 Cellulitis of unspecified part of limb: Secondary | ICD-10-CM

## 2011-10-13 DIAGNOSIS — L02419 Cutaneous abscess of limb, unspecified: Secondary | ICD-10-CM | POA: Insufficient documentation

## 2011-10-13 MED ORDER — AMOXICILLIN-POT CLAVULANATE 875-125 MG PO TABS: 1.0000 | ORAL_TABLET | Freq: Two times a day (BID) | ORAL | Status: DC

## 2011-10-13 MED ORDER — SULFAMETHOXAZOLE-TRIMETHOPRIM 800-160 MG PO TABS: 1.0000 | ORAL_TABLET | Freq: Two times a day (BID) | ORAL | Status: DC

## 2011-10-13 NOTE — Progress Notes (Signed)
Subjective:    Patient ID: Theresa Blake, female    DOB: [birth year], 70 y.o.   MRN: [id number]  HPI  Here for dizziness/ leg problem and swallowing issue   Wt is up 18 lb  R leg is very red and swollen About 10 days ago walked into a wooden chair  Swelled up  Then bruised - and draining a bit  No fever  The area is very hot  Used some ice bags on it  Td ws 05  Dizziness going on for several months  All the time With any kind of position change  Chronic congestion in sinuses  Lots of colored nasal d/c   Patient Active Problem List  Diagnoses  . FUNGAL DERMATITIS  . HYPOTHYROIDISM  . DIABETES MELLITUS, TYPE II  . HYPERLIPIDEMIA  . OBESITY  . PANIC DISORDER  . DEPRESSION  . HYPERTENSION  . ALLERGIC RHINITIS  . FOLLICULITIS  . BACK PAIN  . EDEMA  . PULMONARY EMBOLISM, HX OF  . HIP PAIN, RIGHT  . BACK PAIN, LUMBAR  . Other screening mammogram  . Cellulitis and abscess of leg  . Sinusitis  . Vertigo   Past Medical History  Diagnosis Date  . Depression     mood disorder; ? bipolar  . Diabetes mellitus type II   . HLD (hyperlipidemia)   . HTN (hypertension)   . Hx pulmonary embolism     multiple  . Morbid obesity   . Allergic rhinitis, cause unspecified   . Backache, unspecified   . Malignant neoplasm of corpus uteri, except isthmus   . Edema   . Other specified disease of hair and hair follicles   . Dermatomycosis, unspecified   . Pain in joint, pelvic region and thigh   . Panic disorder without agoraphobia   . Foot fracture, right   . Abnormal Pap smear of cervix    Past Surgical History  Procedure Date  . Tubal ligation   . Tonsillectomy   . Breast biopsy     Right-benign  . Endometrial biopsy 08/2006    attempted   History  Substance Use Topics  . Smoking status: Never Smoker   . Smokeless tobacco: Not on file  . Alcohol Use: No   Family History  Problem Relation Age of Onset  . Heart attack Father 80  . Pancreatic cancer Mother    with mets  . Hyperlipidemia Son   . Hyperlipidemia Son   . Gout Son    Allergies  Allergen Reactions  . Lamotrigine     REACTION: itch, allergy   Current Outpatient Prescriptions on File Prior to Visit  Medication Sig Dispense Refill  . B Complex-C (B-COMPLEX WITH VITAMIN C) tablet Take 1 tablet by mouth daily.        . Blood Glucose Monitoring Suppl (ONE TOUCH ULTRA SYSTEM KIT) W/DEVICE KIT 1 kit by Does not apply route daily.        Marland Kitchen buPROPion (WELLBUTRIN XL) 150 MG 24 hr tablet Take 450 mg by mouth every morning.        . cetirizine (ZYRTEC) 10 MG tablet Take 10 mg by mouth daily.        . cholecalciferol (VITAMIN D) 1000 UNITS tablet Take 1,000 Units by mouth daily.        . clonazePAM (KLONOPIN) 1 MG tablet Take 1 mg by mouth 3 (three) times daily as needed.        . fish oil-omega-3 fatty acids 1000 MG capsule  Take 1 g by mouth daily.        . fluticasone (FLONASE) 50 MCG/ACT nasal spray USE 2 SPRAYS IN EACH NOSTRIL EVERY DAY  16 mL  7  . glucose blood test strip 1 each by Other route daily. One Touch       . hydrochlorothiazide 25 MG tablet TAKE 1 TABLET BY MOUTH EVERY DAY AS NEEDED  30 tablet  3  . levothyroxine (SYNTHROID, LEVOTHROID) 50 MCG tablet Take 1 tablet (50 mcg total) by mouth daily.  90 tablet  3  . lisinopril (PRINIVIL,ZESTRIL) 20 MG tablet Take 20 mg by mouth daily.        . Multiple Vitamin (MULTIVITAMIN) tablet Take 1 tablet by mouth daily.        Marland Kitchen NASAL SPRAY SALINE NA by Nasal route as directed.        . nystatin (MYCOSTATIN) cream APPLY TO AFFECTED AREAS ONCE DAILY AS NEEDED RASH  15 g  1  . omeprazole (PRILOSEC) 20 MG capsule TAKE 1 CAPSULE EVERY DAY  90 capsule  2  . venlafaxine (EFFEXOR-XR) 150 MG 24 hr capsule Take 300 mg by mouth daily.        Marland Kitchen VYTORIN 10-20 MG per tablet TAKE 1 TABLET EVERY DAY  90 tablet  2  . acyclovir (ZOVIRAX) 5 % ointment Apply 1 application topically as directed.        . busPIRone (BUSPAR) 10 MG tablet Take 10 mg by mouth 2  (two) times daily.        . Butenafine HCl (MENTAX) 1 % cream Apply topically daily.        . naproxen (NAPROSYN) 500 MG tablet Take one po bid x 1 week then prn pain, take with food  60 tablet  0  . orlistat (ALLI) 60 MG capsule Take 60 mg by mouth 3 (three) times daily with meals.              Review of Systems Review of Systems  Constitutional: Negative for fever, appetite change, fatigue and unexpected weight change.  Eyes: Negative for pain and visual disturbance.  ENT pos for congestion and sinus pain, neg for sore throat, ear pain Respiratory: Negative for shortness of breath.   Cardiovascular: Negative for cp or palpitations    Gastrointestinal: Negative for nausea, diarrhea and constipation.  Genitourinary: Negative for urgency and frequency.  Skin: Negative for pallor or rash  pos for redness and warmth  Neurological: Negative for weakness, light-headedness, numbness and headaches.  Hematological: Negative for adenopathy. Does not bruise/bleed easily.  Psychiatric/Behavioral: pos for depression and anxiety , no SI          Objective:   Physical Exam  Constitutional: She appears well-developed and well-nourished. No distress.  HENT:  Head: Normocephalic and atraumatic.  Right Ear: External ear normal.  Left Ear: External ear normal.  Mouth/Throat: Oropharynx is clear and moist.       Nares are injected and congested   bilat maxillary sinus tenderness that is mild TMs clear but dull Post nasal drip noted   Eyes: Conjunctivae and EOM are normal. Pupils are equal, round, and reactive to light. Right eye exhibits no discharge. Left eye exhibits no discharge.  Neck: Normal range of motion. Neck supple. No JVD present. Carotid bruit is not present. No thyromegaly present.  Cardiovascular: Normal rate, regular rhythm, normal heart sounds and intact distal pulses.  Exam reveals no gallop.   Pulmonary/Chest: Effort normal and breath sounds normal. No respiratory  distress. She  has no wheezes. She has no rales.  Musculoskeletal: She exhibits edema and tenderness.       R leg Abrasion with several small draining pustules on lateral shin surrounded by 5-7 cm oval of erythema and induration consistent with cellulitis Tender and also warm to the touch Wound cx obtained  No palpable cords or calf tenderness bilat pedal edema baseline   Lymphadenopathy:    She has no cervical adenopathy.  Neurological: She is alert. She has normal reflexes. She exhibits normal muscle tone.  Skin: Skin is warm and dry. There is erythema. No pallor.  Psychiatric:       Baseline depressed and anxious          Assessment & Plan:

## 2011-10-13 NOTE — Patient Instructions (Signed)
I want you to take 2 antibiotics for both the leg infection (cellulitis) and also sinus infection  augmentin and bactrim Can take them with food  I sent these to the pharmacy We will let you know when wound culture returns Use warm compress Wash area with antibacterial soap and water and use triple antibiotic ointment on area that is draining  Follow up with me on Monday please  If redness or swelling or pain increase - go to ER please, or if fever (do not wait- just go)

## 2011-10-14 NOTE — Assessment & Plan Note (Signed)
Suspect related to sinus congestion and sinusitis Treating that Will re eval at f/u

## 2011-10-14 NOTE — Assessment & Plan Note (Signed)
After blunt injury  Wound cx obtained  tx with  Both bactrim and augmentin until that returns  ? Remote hx of mrsa in her household  Disc elevation/warm compresses/ hygiene and use of abx oint

## 2011-10-14 NOTE — Assessment & Plan Note (Signed)
After long course of congestion Now this may be causing her dizziness too Cover with augmentin Disc sympt care Update if worse  Following up early next week

## 2011-10-17 LAB — WOUND CULTURE: Gram Stain: NONE SEEN

## 2011-10-18 ENCOUNTER — Ambulatory Visit (INDEPENDENT_AMBULATORY_CARE_PROVIDER_SITE_OTHER): Payer: Medicare Other | Admitting: Family Medicine

## 2011-10-18 ENCOUNTER — Encounter: Payer: Self-pay | Admitting: Family Medicine

## 2011-10-18 VITALS — BP 138/90 | HR 92 | Temp 97.9°F

## 2011-10-18 DIAGNOSIS — L03119 Cellulitis of unspecified part of limb: Secondary | ICD-10-CM

## 2011-10-18 DIAGNOSIS — L02419 Cutaneous abscess of limb, unspecified: Secondary | ICD-10-CM

## 2011-10-18 DIAGNOSIS — J329 Chronic sinusitis, unspecified: Secondary | ICD-10-CM

## 2011-10-18 MED ORDER — SULFAMETHOXAZOLE-TRIMETHOPRIM 800-160 MG PO TABS: 1.0000 | ORAL_TABLET | Freq: Two times a day (BID) | ORAL | Status: DC

## 2011-10-18 MED ORDER — HYDROCHLOROTHIAZIDE 25 MG PO TABS: 25.0000 mg | ORAL_TABLET | Freq: Every day | ORAL | Status: DC

## 2011-10-18 NOTE — Assessment & Plan Note (Signed)
Improved on abx -will continue bactrim  Sinuses are draining with relief

## 2011-10-18 NOTE — Patient Instructions (Addendum)
You can stop the augmentin (amoxicillin) if you want to  Continue the bactrim (sulfa antibiotic)- for MRSA  Clean surfaces of house with lysol - this is contagious  Keep dressing leg as you have If wore / redness/ swelling/ pain or fever- call asap or seek care in ER if after hours  Follow up with me on Friday please for re check  I'm glad the leg and sinuses are getting better

## 2011-10-18 NOTE — Assessment & Plan Note (Signed)
mrsa with wound that is draining well - improved redness/ swelling and pain  Will continue the bactrim for an additional 7 days after completing the 10 Rev wound cx with pt - can stop the augmentin Will continue current dressings and warm compresses- doing very well with that and elevation Disc hygiene and wiping down her house F/u Friday for re check

## 2011-10-18 NOTE — Progress Notes (Signed)
Subjective:    Patient ID: Theresa Blake, female    DOB: February 07, 1941, 70 y.o.   MRN: [id number]  HPI Here for f/u of cellulitis and also sinusitis   Was put on augmentin and also septra last time  Wound cx yielded MRSA that is responsive to sulfa   She thinks the cellulitis is quite a bit better Wound is draining well  Dresses frequently - with triple abx oint  Elevating and warm compresses  Pain is much better  Swelling is down too - esp when elevating  Less red   Sinuses are starting to drain a lot more - that gives her some relief  Some dizzy at times - but improved   Patient Active Problem List  Diagnoses  . FUNGAL DERMATITIS  . HYPOTHYROIDISM  . DIABETES MELLITUS, TYPE II  . HYPERLIPIDEMIA  . OBESITY  . PANIC DISORDER  . DEPRESSION  . HYPERTENSION  . ALLERGIC RHINITIS  . FOLLICULITIS  . BACK PAIN  . EDEMA  . PULMONARY EMBOLISM, HX OF  . HIP PAIN, RIGHT  . BACK PAIN, LUMBAR  . Other screening mammogram  . Cellulitis and abscess of leg  . Sinusitis  . Vertigo   Past Medical History  Diagnosis Date  . Depression     mood disorder; ? bipolar  . Diabetes mellitus type II   . HLD (hyperlipidemia)   . HTN (hypertension)   . Hx pulmonary embolism     multiple  . Morbid obesity   . Allergic rhinitis, cause unspecified   . Backache, unspecified   . Malignant neoplasm of corpus uteri, except isthmus   . Edema   . Other specified disease of hair and hair follicles   . Dermatomycosis, unspecified   . Pain in joint, pelvic region and thigh   . Panic disorder without agoraphobia   . Foot fracture, right   . Abnormal Pap smear of cervix    Past Surgical History  Procedure Date  . Tubal ligation   . Tonsillectomy   . Breast biopsy     Right-benign  . Endometrial biopsy 08/2006    attempted   History  Substance Use Topics  . Smoking status: Never Smoker   . Smokeless tobacco: Not on file  . Alcohol Use: No   Family History  Problem Relation Age of  Onset  . Heart attack Father 73  . Pancreatic cancer Mother     with mets  . Hyperlipidemia Son   . Hyperlipidemia Son   . Gout Son    Allergies  Allergen Reactions  . Lamotrigine     REACTION: itch, allergy   Current Outpatient Prescriptions on File Prior to Visit  Medication Sig Dispense Refill  . acyclovir (ZOVIRAX) 5 % ointment Apply 1 application topically as directed.        . B Complex-C (B-COMPLEX WITH VITAMIN C) tablet Take 1 tablet by mouth daily.        . Blood Glucose Monitoring Suppl (ONE TOUCH ULTRA SYSTEM KIT) W/DEVICE KIT 1 kit by Does not apply route daily.        Marland Kitchen buPROPion (WELLBUTRIN XL) 150 MG 24 hr tablet Take 450 mg by mouth every morning.        . busPIRone (BUSPAR) 10 MG tablet Take 10 mg by mouth 2 (two) times daily.        . Butenafine HCl (MENTAX) 1 % cream Apply topically daily.        . cetirizine (ZYRTEC) 10 MG  tablet Take 10 mg by mouth daily.        . cholecalciferol (VITAMIN D) 1000 UNITS tablet Take 1,000 Units by mouth daily.        . clonazePAM (KLONOPIN) 1 MG tablet Take 1 mg by mouth 3 (three) times daily as needed.        . fish oil-omega-3 fatty acids 1000 MG capsule Take 1 g by mouth daily.        . fluticasone (FLONASE) 50 MCG/ACT nasal spray USE 2 SPRAYS IN EACH NOSTRIL EVERY DAY  16 mL  7  . glucose blood test strip 1 each by Other route daily. One Touch       . levothyroxine (SYNTHROID, LEVOTHROID) 50 MCG tablet Take 1 tablet (50 mcg total) by mouth daily.  90 tablet  3  . lisinopril (PRINIVIL,ZESTRIL) 20 MG tablet Take 20 mg by mouth daily.        . Multiple Vitamin (MULTIVITAMIN) tablet Take 1 tablet by mouth daily.        Marland Kitchen NASAL SPRAY SALINE NA by Nasal route as directed.        . nystatin (MYCOSTATIN) cream APPLY TO AFFECTED AREAS ONCE DAILY AS NEEDED RASH  15 g  1  . omeprazole (PRILOSEC) 20 MG capsule TAKE 1 CAPSULE EVERY DAY  90 capsule  2  . orlistat (ALLI) 60 MG capsule Take 60 mg by mouth 3 (three) times daily with meals.         . venlafaxine (EFFEXOR-XR) 150 MG 24 hr capsule Take 300 mg by mouth daily.        Marland Kitchen VYTORIN 10-20 MG per tablet TAKE 1 TABLET EVERY DAY  90 tablet  2  . naproxen (NAPROSYN) 500 MG tablet Take one po bid x 1 week then prn pain, take with food  60 tablet  0     Review of Systems Review of Systems  Constitutional: Negative for fever, appetite change, fatigue and unexpected weight change.  Eyes: Negative for pain and visual disturbance.  ENT pos for nasal congestion and drainage , neg for st Respiratory: Negative for cough and shortness of breath.   Cardiovascular: Negative for cp or palpitations    Gastrointestinal: Negative for nausea, diarrhea and constipation.  Genitourinary: Negative for urgency and frequency.  Skin: Negative for pallor or rash  pos for redness  Neurological: Negative for weakness, light-headedness, numbness and headaches.  Hematological: Negative for adenopathy. Does not bruise/bleed easily.  Psychiatric/Behavioral: pos for chronic anxiety- but mood is ok today        Objective:   Physical Exam  Constitutional: She appears well-developed and well-nourished.  HENT:  Head: Normocephalic and atraumatic.  Right Ear: External ear normal.  Left Ear: External ear normal.  Mouth/Throat: Oropharynx is clear and moist.       Nares boggy  No sinus tenderness   Eyes: Conjunctivae and EOM are normal. Pupils are equal, round, and reactive to light.  Neck: Normal range of motion. Neck supple.  Cardiovascular: Normal rate, regular rhythm, normal heart sounds and intact distal pulses.   Pulmonary/Chest: Effort normal and breath sounds normal.  Musculoskeletal: She exhibits edema.  Neurological: She is alert. She has normal reflexes.  Skin: Skin is warm and dry. There is erythema.       Redness on R leg is lessened significantly - and so has swelling and warmth Wound on shin is draining clear yellow fluid Less tender  Nl perfusion  Line drawn around the edge of  erythema    Psychiatric:       Baseline anxious- but a bit better than usual          Assessment & Plan:

## 2011-10-20 ENCOUNTER — Ambulatory Visit: Payer: Medicare Other | Admitting: Family Medicine

## 2011-10-22 ENCOUNTER — Ambulatory Visit (INDEPENDENT_AMBULATORY_CARE_PROVIDER_SITE_OTHER): Payer: Medicare Other | Admitting: Family Medicine

## 2011-10-22 ENCOUNTER — Encounter: Payer: Self-pay | Admitting: Family Medicine

## 2011-10-22 VITALS — BP 130/88 | HR 104 | Temp 97.4°F

## 2011-10-22 DIAGNOSIS — L02419 Cutaneous abscess of limb, unspecified: Secondary | ICD-10-CM

## 2011-10-22 DIAGNOSIS — L03119 Cellulitis of unspecified part of limb: Secondary | ICD-10-CM

## 2011-10-22 NOTE — Progress Notes (Signed)
Subjective:    Patient ID: Theresa Blake, female    DOB: 1941-05-13, 70 y.o.   MRN: [id number]  HPI  Here for f/u of cellulitis of R leg - MRSA Is on sulfa- to which it is sensitive   She thinks it is still getting better  A little more red today since she put her socks on this am- too tight around the ankle and calf Drainage is slowing down overall - - can't tell if colored  No fever  No more pain at all   No trouble with the medicine  Has not needed any pain med   Is nervous today- son drove her here and he drives "too fast" Overall pleased she is getting better   Patient Active Problem List  Diagnoses  . FUNGAL DERMATITIS  . HYPOTHYROIDISM  . DIABETES MELLITUS, TYPE II  . HYPERLIPIDEMIA  . OBESITY  . PANIC DISORDER  . DEPRESSION  . HYPERTENSION  . ALLERGIC RHINITIS  . FOLLICULITIS  . BACK PAIN  . EDEMA  . PULMONARY EMBOLISM, HX OF  . HIP PAIN, RIGHT  . BACK PAIN, LUMBAR  . Other screening mammogram  . Cellulitis and abscess of leg  . Sinusitis  . Vertigo   Past Medical History  Diagnosis Date  . Depression     mood disorder; ? bipolar  . Diabetes mellitus type II   . HLD (hyperlipidemia)   . HTN (hypertension)   . Hx pulmonary embolism     multiple  . Morbid obesity   . Allergic rhinitis, cause unspecified   . Backache, unspecified   . Malignant neoplasm of corpus uteri, except isthmus   . Edema   . Other specified disease of hair and hair follicles   . Dermatomycosis, unspecified   . Pain in joint, pelvic region and thigh   . Panic disorder without agoraphobia   . Foot fracture, right   . Abnormal Pap smear of cervix    Past Surgical History  Procedure Date  . Tubal ligation   . Tonsillectomy   . Breast biopsy     Right-benign  . Endometrial biopsy 08/2006    attempted   History  Substance Use Topics  . Smoking status: Never Smoker   . Smokeless tobacco: Not on file  . Alcohol Use: No   Family History  Problem Relation Age of Onset    . Heart attack Father 2  . Pancreatic cancer Mother     with mets  . Hyperlipidemia Son   . Hyperlipidemia Son   . Gout Son    Allergies  Allergen Reactions  . Lamotrigine     REACTION: itch, allergy   Current Outpatient Prescriptions on File Prior to Visit  Medication Sig Dispense Refill  . B Complex-C (B-COMPLEX WITH VITAMIN C) tablet Take 1 tablet by mouth daily.        . Blood Glucose Monitoring Suppl (ONE TOUCH ULTRA SYSTEM KIT) W/DEVICE KIT 1 kit by Does not apply route daily.        Marland Kitchen buPROPion (WELLBUTRIN XL) 150 MG 24 hr tablet Take 450 mg by mouth every morning.        . busPIRone (BUSPAR) 10 MG tablet Take 10 mg by mouth 2 (two) times daily.        . Butenafine HCl (MENTAX) 1 % cream Apply topically daily.        . cetirizine (ZYRTEC) 10 MG tablet Take 10 mg by mouth daily.        Marland Kitchen  cholecalciferol (VITAMIN D) 1000 UNITS tablet Take 1,000 Units by mouth daily.        . fish oil-omega-3 fatty acids 1000 MG capsule Take 1 g by mouth daily.        . fluticasone (FLONASE) 50 MCG/ACT nasal spray USE 2 SPRAYS IN EACH NOSTRIL EVERY DAY  16 mL  7  . glucose blood test strip 1 each by Other route daily. One Touch       . hydrochlorothiazide (HYDRODIURIL) 25 MG tablet Take 1 tablet (25 mg total) by mouth daily.  30 tablet  11  . levothyroxine (SYNTHROID, LEVOTHROID) 50 MCG tablet Take 1 tablet (50 mcg total) by mouth daily.  90 tablet  3  . lisinopril (PRINIVIL,ZESTRIL) 20 MG tablet Take 20 mg by mouth daily.        . Multiple Vitamin (MULTIVITAMIN) tablet Take 1 tablet by mouth daily.        Marland Kitchen NASAL SPRAY SALINE NA by Nasal route as directed.        . nystatin (MYCOSTATIN) cream APPLY TO AFFECTED AREAS ONCE DAILY AS NEEDED RASH  15 g  1  . omeprazole (PRILOSEC) 20 MG capsule TAKE 1 CAPSULE EVERY DAY  90 capsule  2  . orlistat (ALLI) 60 MG capsule Take 60 mg by mouth 3 (three) times daily with meals.        Marland Kitchen sulfamethoxazole-trimethoprim (BACTRIM DS,SEPTRA DS) 800-160 MG per  tablet Take 1 tablet by mouth 2 (two) times daily.  14 tablet  0  . venlafaxine (EFFEXOR-XR) 150 MG 24 hr capsule Take 300 mg by mouth daily.        Marland Kitchen VYTORIN 10-20 MG per tablet TAKE 1 TABLET EVERY DAY  90 tablet  2  . acyclovir (ZOVIRAX) 5 % ointment Apply 1 application topically as directed.        . clonazePAM (KLONOPIN) 1 MG tablet Take 1 mg by mouth 3 (three) times daily as needed.        . naproxen (NAPROSYN) 500 MG tablet Take one po bid x 1 week then prn pain, take with food  60 tablet  0      Review of Systems Review of Systems  Constitutional: Negative for fever, appetite change,  and unexpected weight change. is fatigued - no more than baseline  Eyes: Negative for pain and visual disturbance.  Respiratory: Negative for cough and shortness of breath.   Cardiovascular: Negative for cp or palpitations    Gastrointestinal: Negative for nausea, diarrhea and constipation.  Genitourinary: Negative for urgency and frequency.  Skin: Negative for pallor or rash  pos for redness that is improving  Neurological: Negative for weakness, light-headedness, numbness and headaches.  Hematological: Negative for adenopathy. Does not bruise/bleed easily.  Psychiatric/Behavioral: Negative for dysphoric mood. The patient is not nervous/anxious.         Objective:   Physical Exam  Constitutional: She appears well-developed and well-nourished. No distress.       Morbidly obese and well appearing - somewhat anxious today  HENT:  Head: Normocephalic and atraumatic.  Eyes: Conjunctivae and EOM are normal. Pupils are equal, round, and reactive to light.  Cardiovascular: Normal rate, regular rhythm and normal heart sounds.        After sitting pulse returns to normal   Skin: Skin is warm and dry. There is erythema. No pallor.       R leg - area of immediate redness surrounding wound is smaller - and wound has pale granulation  tissue with clear drainage  Swelling is decreased with some wrinkling and  peeling of skin on shin  Some hyperemia of ankle -- with her baseline edema - after taking off tight socks  Leg and foot are well perfused   Psychiatric:       More anxious today          Assessment & Plan:

## 2011-10-22 NOTE — Assessment & Plan Note (Addendum)
Overall making slow improvement with sulfa abx to which her mrsa is sensitive  Immediate redness around wound is improved and soft tissue is softer without any tenderness Drainage is clear/ tan without pus  Enc pt to elevate leg  Discouraged wear of tight knee socks with exacerbated redness  Dressed sterilly today Will inc her bactrim ds to tid over weekend and then f/u for re check Pt inst to call if worse in any way- see AVS

## 2011-10-22 NOTE — Patient Instructions (Signed)
Increase your bactrim ds from twice daily to three times daily  Follow up with me early to mid next week  Avoid socks  Elevate leg every chance you get  Keep up with wound dressings like you are  If fever/ increase redness  / swelling or pain - call immediately

## 2011-10-25 ENCOUNTER — Telehealth: Payer: Self-pay | Admitting: Internal Medicine

## 2011-10-25 MED ORDER — SULFAMETHOXAZOLE-TRIMETHOPRIM 800-160 MG PO TABS: ORAL_TABLET | ORAL | Status: DC

## 2011-10-25 NOTE — Telephone Encounter (Signed)
Thanks I do want to continue Please call in or send electronically -- ? Pharmacy Continue this until she sees me back-thanks

## 2011-10-25 NOTE — Telephone Encounter (Signed)
Patient is going to finish her antibiotics tomorrow and wanted to make sure that you didn't want to Rx her anymore Bactrim DS.  Please advise

## 2011-10-25 NOTE — Telephone Encounter (Signed)
Patient notified as instructed by telephone. Rx called to [facility] per patient's request.

## 2011-10-27 ENCOUNTER — Ambulatory Visit (INDEPENDENT_AMBULATORY_CARE_PROVIDER_SITE_OTHER): Payer: Medicare Other | Admitting: Family Medicine

## 2011-10-27 ENCOUNTER — Encounter: Payer: Self-pay | Admitting: Family Medicine

## 2011-10-27 VITALS — BP 138/86 | HR 100 | Temp 98.4°F

## 2011-10-27 DIAGNOSIS — L03119 Cellulitis of unspecified part of limb: Secondary | ICD-10-CM

## 2011-10-27 DIAGNOSIS — L02419 Cutaneous abscess of limb, unspecified: Secondary | ICD-10-CM

## 2011-10-27 MED ORDER — HYDROCHLOROTHIAZIDE 25 MG PO TABS: 25.0000 mg | ORAL_TABLET | Freq: Every day | ORAL | Status: DC

## 2011-10-27 NOTE — Progress Notes (Signed)
  Subjective:    Patient ID: Theresa Blake, female    DOB: 04/01/1941, 71 y.o.   MRN: [id number]  HPI Here for f/u of cellulitis of R leg  On bactrim DS tid for MRSA which was improved at last visit   Overall feeling better  Seldom sore at all usually not Less swollen Wound is about the same -- with some clear to tan drainage - not pus  No fever  Taking sulfa tid - tolerates fine   Dressing with abx ointment   Needs refil on her fluid pill- uses prn   Review of Systems     Objective:   Physical Exam  Constitutional: She appears well-developed and well-nourished. No distress.       Morbidly obese and well appearing   HENT:  Head: Normocephalic and atraumatic.  Eyes: Conjunctivae are normal. Pupils are equal, round, and reactive to light.  Cardiovascular: Normal rate, regular rhythm and normal heart sounds.   Pulmonary/Chest: No respiratory distress. She has no wheezes.  Musculoskeletal: She exhibits edema.       bilat pedal edema , improved on R  Neurological: She is alert. She has normal reflexes.  Skin: Skin is warm and dry. No rash noted. There is erythema. No pallor.       R leg- improved swelling  Area of redness around wound - now about 1-2 cm max No longer firm around wound  Leg is soft/ no heat or tenderness  Wound cleaned with betadyne/ alcohol swab Dressed sterilly with gauze/ bactroban and paper tape  Psychiatric:       A little less anxious today but generally anxioius          Assessment & Plan:

## 2011-10-27 NOTE — Patient Instructions (Signed)
Your leg is gradually looking better  Keep elevating it and using warm compresses Keep dressing the wound - you are doing a good job with that  Continue the current antibiotic (bactrim) three times per day Follow up in 1 week for a re check If worse or painful / drainage/ fever or other symptoms - call immediately

## 2011-10-27 NOTE — Assessment & Plan Note (Signed)
This continues to improve (MRSA)-- wound itself is smaller and drainage is clear  Leg itself less swollen and red , not painful  Wound cleaned and sterilly dressed with bactroban Enc pt to continue same routine -- elevation/ warm compress and bactrim DS tid  F/u 1 week  If worse enc to call or seek care if after hours  If no further improvement at any time will contact wound care center

## 2011-11-05 ENCOUNTER — Ambulatory Visit (INDEPENDENT_AMBULATORY_CARE_PROVIDER_SITE_OTHER): Payer: Medicare Other | Admitting: Family Medicine

## 2011-11-05 ENCOUNTER — Encounter: Payer: Self-pay | Admitting: Family Medicine

## 2011-11-05 VITALS — BP 128/84 | HR 98 | Temp 97.6°F | Ht 65.75 in | Wt 344.5 lb

## 2011-11-05 DIAGNOSIS — S161XXA Strain of muscle, fascia and tendon at neck level, initial encounter: Secondary | ICD-10-CM | POA: Insufficient documentation

## 2011-11-05 DIAGNOSIS — S81801A Unspecified open wound, right lower leg, initial encounter: Secondary | ICD-10-CM

## 2011-11-05 DIAGNOSIS — L03119 Cellulitis of unspecified part of limb: Secondary | ICD-10-CM

## 2011-11-05 DIAGNOSIS — S81009A Unspecified open wound, unspecified knee, initial encounter: Secondary | ICD-10-CM

## 2011-11-05 DIAGNOSIS — S139XXA Sprain of joints and ligaments of unspecified parts of neck, initial encounter: Secondary | ICD-10-CM

## 2011-11-05 DIAGNOSIS — Z23 Encounter for immunization: Secondary | ICD-10-CM

## 2011-11-05 MED ORDER — SULFAMETHOXAZOLE-TRIMETHOPRIM 800-160 MG PO TABS: ORAL_TABLET | ORAL | Status: DC

## 2011-11-05 NOTE — Patient Instructions (Addendum)
For neck - use a heating pad for 10 minutes at a time Keep stretching it  Consider getting a foam pillow for cervical support Continue the bactrim for the leg  Update if not starting to improve in a week or if worsening  - especially if pain or fever or increased redness Continue dressings as you have been using them We will do referral to wound care specialist at check out  tetnus shot (Tdap ) today

## 2011-11-05 NOTE — Assessment & Plan Note (Signed)
Oval wound on RLL with cellulitis that is improving  Wound itself is not decreasing in size at this point / has granulation tissue and no pus  Pt is morbidly obese and diabetic - not healing , also some lymphedema  Will ref to wound care center Pt will continue current dressings bid and elevate when able  Did update Tdap today

## 2011-11-05 NOTE — Assessment & Plan Note (Signed)
Mild and improved from this am  Enc not to lie on couch and to consider foam cervical support pillow  Heat prn 10 min on and off Update if worse or not improved / or if any neuro sympt

## 2011-11-05 NOTE — Assessment & Plan Note (Signed)
Cellulitis continues to improve with reduction of swelling and no pain (mrsa) on bactrim ds tid  However wound is not decreasing in size  Will continue the bactrim for 5 addl days and also ref to wound care center for addl help

## 2011-11-05 NOTE — Progress Notes (Signed)
Subjective:    Patient ID: Theresa Blake, female    DOB: Mar 01, 1941, 71 y.o.   MRN: [id number]  HPI Here for f/u of cellulitis  Hard to tell if her wound is getting smaller No pain  Leg looks and feels the same Had to change to paper tape - other made her itch Yellow tinted clear drainage  Feels ok - no fever  Still dresing with abx oint    A little pain in R side of neck- feels like a crick Happened after lying on the couch  Hurts mostly to turn R Is better than it was this am  No numbness or rad to arm   Patient Active Problem List  Diagnoses  . FUNGAL DERMATITIS  . HYPOTHYROIDISM  . DIABETES MELLITUS, TYPE II  . HYPERLIPIDEMIA  . OBESITY  . PANIC DISORDER  . DEPRESSION  . HYPERTENSION  . ALLERGIC RHINITIS  . FOLLICULITIS  . BACK PAIN  . EDEMA  . PULMONARY EMBOLISM, HX OF  . HIP PAIN, RIGHT  . BACK PAIN, LUMBAR  . Other screening mammogram  . Cellulitis and abscess of leg  . Sinusitis  . Vertigo  . Neck strain  . Wound of right leg   Past Medical History  Diagnosis Date  . Depression     mood disorder; ? bipolar  . Diabetes mellitus type II   . HLD (hyperlipidemia)   . HTN (hypertension)   . Hx pulmonary embolism     multiple  . Morbid obesity   . Allergic rhinitis, cause unspecified   . Backache, unspecified   . Malignant neoplasm of corpus uteri, except isthmus   . Edema   . Other specified disease of hair and hair follicles   . Dermatomycosis, unspecified   . Pain in joint, pelvic region and thigh   . Panic disorder without agoraphobia   . Foot fracture, right   . Abnormal Pap smear of cervix    Past Surgical History  Procedure Date  . Tubal ligation   . Tonsillectomy   . Breast biopsy     Right-benign  . Endometrial biopsy 08/2006    attempted   History  Substance Use Topics  . Smoking status: Never Smoker   . Smokeless tobacco: Not on file  . Alcohol Use: No   Family History  Problem Relation Age of Onset  . Heart attack  Father 36  . Pancreatic cancer Mother     with mets  . Hyperlipidemia Son   . Hyperlipidemia Son   . Gout Son    Allergies  Allergen Reactions  . Lamotrigine     REACTION: itch, allergy   Current Outpatient Prescriptions on File Prior to Visit  Medication Sig Dispense Refill  . B Complex-C (B-COMPLEX WITH VITAMIN C) tablet Take 1 tablet by mouth daily.        . Blood Glucose Monitoring Suppl (ONE TOUCH ULTRA SYSTEM KIT) W/DEVICE KIT 1 kit by Does not apply route daily.        Marland Kitchen buPROPion (WELLBUTRIN XL) 150 MG 24 hr tablet Take 450 mg by mouth every morning.        . Butenafine HCl (MENTAX) 1 % cream Apply topically daily.        . cetirizine (ZYRTEC) 10 MG tablet Take 10 mg by mouth daily.        . cholecalciferol (VITAMIN D) 1000 UNITS tablet Take 1,000 Units by mouth daily.        . clonazePAM (KLONOPIN)  1 MG tablet Take 1 mg by mouth 3 (three) times daily as needed.        . fish oil-omega-3 fatty acids 1000 MG capsule Take 1 g by mouth daily.        . fluticasone (FLONASE) 50 MCG/ACT nasal spray USE 2 SPRAYS IN EACH NOSTRIL EVERY DAY  16 mL  7  . glucose blood test strip 1 each by Other route daily. One Touch       . hydrochlorothiazide (HYDRODIURIL) 25 MG tablet Take 1 tablet (25 mg total) by mouth daily.  90 tablet  3  . levothyroxine (SYNTHROID, LEVOTHROID) 50 MCG tablet Take 1 tablet (50 mcg total) by mouth daily.  90 tablet  3  . lisinopril (PRINIVIL,ZESTRIL) 20 MG tablet Take 20 mg by mouth daily.        . Multiple Vitamin (MULTIVITAMIN) tablet Take 1 tablet by mouth daily.        Marland Kitchen NASAL SPRAY SALINE NA by Nasal route as directed.        . nystatin (MYCOSTATIN) cream APPLY TO AFFECTED AREAS ONCE DAILY AS NEEDED RASH  15 g  1  . omeprazole (PRILOSEC) 20 MG capsule TAKE 1 CAPSULE EVERY DAY  90 capsule  2  . venlafaxine (EFFEXOR-XR) 150 MG 24 hr capsule Take 300 mg by mouth daily.        Marland Kitchen VYTORIN 10-20 MG per tablet TAKE 1 TABLET EVERY DAY  90 tablet  2  . acyclovir  (ZOVIRAX) 5 % ointment Apply 1 application topically as directed.        . busPIRone (BUSPAR) 10 MG tablet Take 10 mg by mouth 2 (two) times daily.        . naproxen (NAPROSYN) 500 MG tablet Take one po bid x 1 week then prn pain, take with food  60 tablet  0  . orlistat (ALLI) 60 MG capsule Take 60 mg by mouth 3 (three) times daily with meals.           Review of Systems Review of Systems  Constitutional: Negative for fever, appetite change, fatigue and unexpected weight change.  Eyes: Negative for pain and visual disturbance.  Respiratory: Negative for cough and shortness of breath.   Cardiovascular: Negative for cp or palpitations    Gastrointestinal: Negative for nausea, diarrhea and constipation.  Genitourinary: Negative for urgency and frequency.  Skin: Negative for pallor or rash  pos for redness and mild swelling MSK pos for neck pain without any swelling/ numbness or weakness  Neurological: Negative for weakness, light-headedness, numbness and headaches.  Hematological: Negative for adenopathy. Does not bruise/bleed easily.  Psychiatric/Behavioral: pos for chronic anx and depression.          Objective:   Physical Exam  Constitutional: She appears well-developed and well-nourished. No distress.       Morbidly obese and well appearing   HENT:  Head: Normocephalic and atraumatic.  Mouth/Throat: Oropharynx is clear and moist.  Eyes: Conjunctivae and EOM are normal. Pupils are equal, round, and reactive to light. No scleral icterus.  Neck: Normal range of motion. Neck supple. No JVD present. Erythema present. No thyromegaly present.       Neck- no bony tenderness Bilateral spasm and tightness of pericervical muscules  Also trapezius  Nl flex/ ext with pain , pain to rotate right No crepitice  Cardiovascular: Normal rate, regular rhythm and normal heart sounds.   Pulmonary/Chest: Effort normal and breath sounds normal. No respiratory distress. She has no wheezes.  Lymphadenopathy:    She has no cervical adenopathy.  Neurological: She is alert. She has normal reflexes. No cranial nerve deficit. She exhibits normal muscle tone. Coordination normal.  Skin: Skin is warm and dry. No rash noted. No pallor.       R leg- diffuse redness continues to improve  Edema also improved Some skin peeling where swelling was Area around wound is softer Clear yellow d/c Wound is oval - no change in size and granulation tissue seen          Assessment & Plan:

## 2011-11-12 ENCOUNTER — Other Ambulatory Visit: Payer: Self-pay | Admitting: Family Medicine

## 2011-11-16 NOTE — Telephone Encounter (Signed)
[facility] request refill one touch test strip #100 x 3.

## 2011-11-18 ENCOUNTER — Encounter: Payer: Self-pay | Admitting: Nurse Practitioner

## 2011-11-18 ENCOUNTER — Encounter: Payer: Self-pay | Admitting: Cardiothoracic Surgery

## 2012-01-12 ENCOUNTER — Other Ambulatory Visit: Payer: Self-pay | Admitting: Family Medicine

## 2012-04-08 ENCOUNTER — Emergency Department (HOSPITAL_COMMUNITY): Payer: Medicare Other

## 2012-04-08 ENCOUNTER — Observation Stay (HOSPITAL_COMMUNITY)
Admission: EM | Admit: 2012-04-08 | Discharge: 2012-04-08 | Disposition: A | Discharge status: Home / Self Care | Payer: Medicare Other | Attending: Emergency Medicine | Admitting: Emergency Medicine

## 2012-04-08 ENCOUNTER — Encounter (HOSPITAL_COMMUNITY): Payer: Self-pay | Admitting: Physical Medicine and Rehabilitation

## 2012-04-08 ENCOUNTER — Observation Stay (HOSPITAL_COMMUNITY): Payer: Medicare Other

## 2012-04-08 DIAGNOSIS — S8000XA Contusion of unspecified knee, initial encounter: Secondary | ICD-10-CM | POA: Insufficient documentation

## 2012-04-08 DIAGNOSIS — Y92009 Unspecified place in unspecified non-institutional (private) residence as the place of occurrence of the external cause: Secondary | ICD-10-CM | POA: Insufficient documentation

## 2012-04-08 DIAGNOSIS — E119 Type 2 diabetes mellitus without complications: Secondary | ICD-10-CM | POA: Insufficient documentation

## 2012-04-08 DIAGNOSIS — M549 Dorsalgia, unspecified: Principal | ICD-10-CM | POA: Insufficient documentation

## 2012-04-08 DIAGNOSIS — M25559 Pain in unspecified hip: Secondary | ICD-10-CM | POA: Insufficient documentation

## 2012-04-08 DIAGNOSIS — F3289 Other specified depressive episodes: Secondary | ICD-10-CM | POA: Insufficient documentation

## 2012-04-08 DIAGNOSIS — R262 Difficulty in walking, not elsewhere classified: Secondary | ICD-10-CM | POA: Insufficient documentation

## 2012-04-08 DIAGNOSIS — E785 Hyperlipidemia, unspecified: Secondary | ICD-10-CM | POA: Insufficient documentation

## 2012-04-08 DIAGNOSIS — W19XXXA Unspecified fall, initial encounter: Secondary | ICD-10-CM | POA: Insufficient documentation

## 2012-04-08 DIAGNOSIS — F329 Major depressive disorder, single episode, unspecified: Secondary | ICD-10-CM | POA: Insufficient documentation

## 2012-04-08 DIAGNOSIS — I10 Essential (primary) hypertension: Secondary | ICD-10-CM | POA: Insufficient documentation

## 2012-04-08 DIAGNOSIS — M4800 Spinal stenosis, site unspecified: Secondary | ICD-10-CM

## 2012-04-08 LAB — DIFFERENTIAL
Basophils Absolute: 0 K/uL (ref 0.0–0.1)
Basophils Relative: 0 % (ref 0–1)
Eosinophils Absolute: 0.3 10*3/uL (ref 0.0–0.7)
Eosinophils Relative: 3 % (ref 0–5)
Lymphocytes Relative: 34 % (ref 12–46)
Lymphs Abs: 2.9 10*3/uL (ref 0.7–4.0)
Monocytes Absolute: 0.8 10*3/uL (ref 0.1–1.0)
Monocytes Relative: 10 % (ref 3–12)
Neutro Abs: 4.4 K/uL (ref 1.7–7.7)
Neutrophils Relative %: 53 % (ref 43–77)

## 2012-04-08 LAB — BASIC METABOLIC PANEL
BUN: 11 mg/dL (ref 6–23)
Creatinine, Ser: 0.72 mg/dL (ref 0.50–1.10)
GFR calc non Af Amer: 85 mL/min — ABNORMAL LOW (ref >90)
Glucose, Bld: 133 mg/dL — ABNORMAL HIGH (ref 70–99)
Potassium: 3.7 mEq/L (ref 3.5–5.1)

## 2012-04-08 LAB — URINALYSIS, ROUTINE W REFLEX MICROSCOPIC
Bilirubin Urine: NEGATIVE
Glucose, UA: NEGATIVE mg/dL
Hgb urine dipstick: NEGATIVE
Ketones, ur: NEGATIVE mg/dL
Nitrite: NEGATIVE
Protein, ur: NEGATIVE mg/dL
Specific Gravity, Urine: 1.016 (ref 1.005–1.030)
Urobilinogen, UA: 1 mg/dL (ref 0.0–1.0)
pH: 8 (ref 5.0–8.0)

## 2012-04-08 LAB — CBC
HCT: 44.2 % (ref 36.0–46.0)
Hemoglobin: 14.5 g/dL (ref 12.0–15.0)
MCH: 30.2 pg (ref 26.0–34.0)
MCHC: 32.8 g/dL (ref 30.0–36.0)
MCV: 92.1 fL (ref 78.0–100.0)
Platelets: 320 K/uL (ref 150–400)
RBC: 4.8 MIL/uL (ref 3.87–5.11)
RDW: 13.6 % (ref 11.5–15.5)
WBC: 8.4 K/uL (ref 4.0–10.5)

## 2012-04-08 LAB — URINE MICROSCOPIC-ADD ON

## 2012-04-08 LAB — BASIC METABOLIC PANEL WITH GFR
CO2: 24 meq/L (ref 19–32)
Calcium: 9.8 mg/dL (ref 8.4–10.5)
Chloride: 105 meq/L (ref 96–112)
GFR calc Af Amer: >90 mL/min (ref >90)
Sodium: 138 meq/L (ref 135–145)

## 2012-04-08 MED ORDER — HYDROCODONE-ACETAMINOPHEN 5-325 MG PO TABS: 2.0000 | ORAL_TABLET | Freq: Once | ORAL | Status: DC

## 2012-04-08 MED ORDER — DIAZEPAM 5 MG PO TABS
5.0000 mg | ORAL_TABLET | Freq: Four times a day (QID) | ORAL | Status: DC | PRN
  Administered 2012-04-08: 5 mg via ORAL
  Filled 2012-04-08: qty 1

## 2012-04-08 MED ORDER — OXYCODONE-ACETAMINOPHEN 5-325 MG PO TABS
1.0000 | ORAL_TABLET | Freq: Four times a day (QID) | ORAL | Status: DC | PRN
  Administered 2012-04-08: 1 via ORAL
  Filled 2012-04-08: qty 1

## 2012-04-08 MED ORDER — SODIUM CHLORIDE 0.9 % IV SOLN
1000.0000 mL | INTRAVENOUS | Status: DC
  Administered 2012-04-08: 1000 mL via INTRAVENOUS

## 2012-04-08 NOTE — ED Notes (Signed)
Patient transported to MRI 

## 2012-04-08 NOTE — ED Notes (Signed)
Patient transported to X-ray 

## 2012-04-08 NOTE — ED Notes (Signed)
Pt presents to department for evaluation of frequent falls recently, lower back and R hip pain and also numbness/tingling to bottom of feet. Ongoing x2 weeks. Pt states "I don't know why I fall, I just get tangled up in stuff and fall." pt states she falls 3-5 times a day, and is unable to get off the floor by herself. Denies LOC. States she is anxious and scared. She is alert and oriented x4.

## 2012-04-08 NOTE — ED Provider Notes (Signed)
MRI results reviewed.  Patient states that she has no new complaints, and is comfortable.  VSS.  Results d/w the patient.  I also discussed the importance of ongoing care (specifically ortho).  Gerhard Munch, MD 04/08/12 2207

## 2012-04-08 NOTE — ED Notes (Signed)
Pt lives at home with son. Pt has fallen 5 times in past 2 weeks. There has not been any new medication changes. Pt is overweight and loses her balance. Pt has had past falls but recently they have become more frequent. Per family pt does not seem to have any change in LOC or orienation.

## 2012-04-08 NOTE — ED Provider Notes (Signed)
History     CSN: [id number]  Arrival date & time 04/08/12  1237   First MD Initiated Contact with Patient 04/08/12 1256      Chief Complaint  Patient presents with  . Fall  . Hip Pain  . Back Pain    (Consider location/radiation/quality/duration/timing/severity/associated sxs/prior treatment) HPI Comments: Pt reports more difficulty walking and keeping her balance even with using walker and trying to be careful.  She would fall occasionally in the past, but in the past week, has had 4-5 falls.  She has bruises to both knees and legs.  She feels tingling in feet.  She has pain in right low back and tailbone area, worse with standing and bearing weight she attributes to falling.  no head injury.  Denies CP, HA, neck pain, or upper back pain.  She feels like she cannot raise her toes and foot upward on right side.  No urinary difficulty, if anything urinating too much she reports.  No new medications in the past few weeks.  Has not seen her own PCP for these symptoms recently.    Patient is a 71 y.o. female presenting with fall, hip pain, and back pain. The history is provided by the patient, a relative and medical records.  Fall Associated symptoms include numbness.  Hip Pain Pertinent negatives include no chest pain.  Back Pain  Associated symptoms include numbness and weakness. Pertinent negatives include no chest pain and no dysuria.    Past Medical History  Diagnosis Date  . Depression     mood disorder; ? bipolar  . Diabetes mellitus type II   . HLD (hyperlipidemia)   . HTN (hypertension)   . Hx pulmonary embolism     multiple  . Morbid obesity   . Allergic rhinitis, cause unspecified   . Backache, unspecified   . Malignant neoplasm of corpus uteri, except isthmus   . Edema   . Other specified disease of hair and hair follicles   . Dermatomycosis, unspecified   . Pain in joint, pelvic region and thigh   . Panic disorder without agoraphobia   . Foot fracture, right     . Abnormal Pap smear of cervix     Past Surgical History  Procedure Date  . Tubal ligation   . Tonsillectomy   . Breast biopsy     Right-benign  . Endometrial biopsy 08/2006    attempted    Family History  Problem Relation Age of Onset  . Heart attack Father 36  . Pancreatic cancer Mother     with mets  . Hyperlipidemia Son   . Hyperlipidemia Son   . Gout Son     History  Substance Use Topics  . Smoking status: Never Smoker   . Smokeless tobacco: Not on file  . Alcohol Use: No    OB History    Grav Para Term Preterm Abortions TAB SAB Ect Mult Living                  Review of Systems  Cardiovascular: Negative for chest pain.  Genitourinary: Negative for dysuria, decreased urine volume and difficulty urinating.  Musculoskeletal: Positive for back pain. Negative for joint swelling.  Skin: Negative for color change, rash and wound.  Neurological: Positive for weakness and numbness. Negative for dizziness and light-headedness.  All other systems reviewed and are negative.    Allergies  Lamotrigine  Home Medications   Current Outpatient Rx  Name Route Sig Dispense Refill  . B  COMPLEX-C PO TABS Oral Take 1 tablet by mouth daily.      Letta Pate ULTRA SYSTEM W/DEVICE KIT Does not apply 1 kit by Does not apply route daily.      . BUPROPION HCL ER (XL) 150 MG PO TB24 Oral Take 450 mg by mouth every morning.      . BUSPIRONE HCL 10 MG PO TABS Oral Take 10 mg by mouth 2 (two) times daily.      . BUTENAFINE HCL 1 % EX CREA Topical Apply topically daily.      Marland Kitchen CETIRIZINE HCL 10 MG PO TABS Oral Take 10 mg by mouth daily.      Marland Kitchen VITAMIN D 1000 UNITS PO TABS Oral Take 1,000 Units by mouth daily.      Marland Kitchen CLONAZEPAM 1 MG PO TABS Oral Take 1 mg by mouth 3 (three) times daily as needed. For anxiety    . OMEGA-3 FATTY ACIDS 1000 MG PO CAPS Oral Take 1 g by mouth daily.      Marland Kitchen FLUTICASONE PROPIONATE 50 MCG/ACT NA SUSP  USE 2 SPRAYS IN EACH NOSTRIL EVERY DAY 16 mL 7  .  HYDROCHLOROTHIAZIDE 25 MG PO TABS Oral Take 1 tablet (25 mg total) by mouth daily. 90 tablet 3  . LEVOTHYROXINE SODIUM 50 MCG PO TABS Oral Take 1 tablet (50 mcg total) by mouth daily. 90 tablet 3  . LISINOPRIL 20 MG PO TABS  TAKE 1 TABLET BY MOUTH EVERY DAY 30 tablet 11  . ONE-DAILY MULTI VITAMINS PO TABS Oral Take 1 tablet by mouth daily.      Marland Kitchen NAPROXEN 500 MG PO TABS  Take one po bid x 1 week then prn pain, take with food 60 tablet 0  . NASAL SPRAY SALINE NA Nasal by Nasal route as directed.      . NYSTATIN 100000 UNIT/GM EX CREA  APPLY TO AFFECTED AREAS ONCE DAILY AS NEEDED RASH 15 g 1  . OMEPRAZOLE 20 MG PO CPDR  TAKE 1 CAPSULE EVERY DAY 90 capsule 2  . ONETOUCH ULTRA BLUE VI STRP  CHECK BLOOD SUGAR ONCE DAILY AND AS DIRECTED 100 each 3  . ORLISTAT 60 MG PO CAPS Oral Take 60 mg by mouth 3 (three) times daily with meals.      . VENLAFAXINE HCL ER 150 MG PO CP24 Oral Take 300 mg by mouth daily.      Marland Kitchen VYTORIN 10-20 MG PO TABS  TAKE 1 TABLET EVERY DAY 90 tablet 2    BP 116/100  Pulse 96  Temp 98 F (36.7 C) (Oral)  Resp 20  SpO2 92%  Physical Exam  Nursing note and vitals reviewed. Constitutional: She appears well-developed and well-nourished.  HENT:  Head: Normocephalic and atraumatic.  Eyes: Pupils are equal, round, and reactive to light.  Neck: Neck supple.  Cardiovascular: Normal rate.   Pulmonary/Chest: Effort normal. No respiratory distress.  Abdominal: Soft. She exhibits no distension. There is no tenderness.  Musculoskeletal:       Bruises to knees.  FROM of both hips, knees and ankles without sig pain or crepitus.  Pt with stuttering strength to both hips, stronger on left than right.  No asymetric edema to either lower leg.    Neurological: She is alert. She displays tremor. No cranial nerve deficit. She exhibits normal muscle tone. Coordination abnormal.       Tremors to both UE with intention, worse on left.  Pt states chronic.  Slightly weaker right hip, right  quad  versus left lower extremity.  Unable to feel reflexes due to habitus  Skin: Skin is warm.    ED Course  Procedures (including critical care time)   Labs Reviewed  CBC  DIFFERENTIAL  BASIC METABOLIC PANEL  URINALYSIS, ROUTINE W REFLEX MICROSCOPIC   No results found.   No diagnosis found.  Plain films did not reveal any fractures.  I reviewed radiology interpretations my self.  Pt placed on back pain protocol for CDU observation and in the meantime obtain MRI which I discussed with radiologist.  Pt is signed out to Dr. Jeraldine Loots for follow up on MRI scan and re-evaluation.  For any immediate surgical issues, NSU consultation could be obtained.  Otherwise can remain on protocol for further pain relief and discharged to outpt with recommendations for outpt follow up in a close fashion.  MDM  Will get plain films to start due to fall.  May require MRI of lumbar spine.  Will check UA and electrolytes as well due to urinary freq and thirst.          Gavin Pound. Derhonda Eastlick, MD 04/10/12 1530

## 2012-04-08 NOTE — Discharge Instructions (Signed)
Spinal Stenosis One cause of back pain is spinal stenosis. Stenosis means abnormal narrowing. The spinal canal contains and protects the spinal nerve roots. In spinal stenosis, the spinal canal narrows and pinches the spinal cord and nerves. This causes low back pain and pain in the legs. Stenosis may pinch the nerves that control muscles and sensation in the legs. This leads to pain and abnormal feelings in the leg muscles and areas supplied by those nerves. CAUSES  Spinal stenosis often happens to people as they get older and arthritic boney growths occur in their spinal canal. There is also a loss of the disk height between the bones of the back, which also adds to this problem. Sometimes the problem is present at birth. SYMPTOMS   Pain that is generally worse with activities, particularly standing and walking.   Numbness, tingling, hot or cold feelings, weakness, or a weariness in the legs.   Clumsiness, frequent falling, and a foot-slapping gait, which may come as a result of nerve pressure and muscle weakness.  DIAGNOSIS   Your caregiver may suspect spinal stenosis if you have unusual leg symptoms, such as those previously mentioned.   Your orthopedic surgeon may request special imaging exams, such a computerized magnetic scan (MRI) or computerized X-ray scan (CT) to find out the cause of the problem.  TREATMENT   Sometimes treatments such as postural changes or nonsteroidal anti-inflammatory drugs will relieve the pain.   Nonsteroidal anti-inflammatory medications may help relieve symptoms. These medicines do this by decreasing swelling and inflammation in the nerves.   When stenosis causes severe nerve root compression, conservative treatment may not be enough to maintain a normal lifestyle. Surgery may be recommended to relieve the pressure on affected nerves. In properly selected patients, the results are very good, and patients are able to continue a normal lifestyle.  HOME CARE  INSTRUCTIONS   Flexing the spine by leaning forward while walking may relieve symptoms. Lying with the knees drawn up to the chest may offer some relief. These positions enlarge the space available to the nerves. They may make it easier for stenosis sufferers to walk longer distances.   Rest, followed by gradually resuming activity, also can help.   Aerobic activity, such as bicycling or swimming, is often recommended.   Losing weight can also relieve some of the load on the spine.   Application of warm or cold compresses to the area of pain can be helpful.  SEEK MEDICAL CARE IF:   The periods of relief between episodes of pain become shorter and shorter.   You experience pain that radiates down your leg, even when you are not standing or walking.  SEEK IMMEDIATE MEDICAL CARE IF:   You have a loss of bowel or bladder control.   You have a sudden loss of feeling in your legs.   You suddenly cannot move your legs.  Document Released: 01/01/2004 Document Revised: 09/30/2011 Document Reviewed: 02/26/2010 [employer] Patient Information 2012 [employer]. 

## 2012-04-08 NOTE — ED Notes (Signed)
Family at bedside. 

## 2012-04-12 ENCOUNTER — Encounter: Payer: Self-pay | Admitting: Family Medicine

## 2012-04-12 ENCOUNTER — Ambulatory Visit (INDEPENDENT_AMBULATORY_CARE_PROVIDER_SITE_OTHER): Payer: Medicare Other | Admitting: Family Medicine

## 2012-04-12 VITALS — BP 128/84 | HR 93 | Temp 98.2°F | Wt 354.5 lb

## 2012-04-12 DIAGNOSIS — M48061 Spinal stenosis, lumbar region without neurogenic claudication: Secondary | ICD-10-CM

## 2012-04-12 NOTE — Progress Notes (Signed)
Subjective:    Patient ID: Theresa Blake, female    DOB: January 29, 1941, 71 y.o.   MRN: [id number]  HPI Micah Flesher to the ER on sat - back pain and hip pain with numbness and tingling in feet  Had to start using a walker at home  [facility] Did [facility] film-nl  MRI- dx with spinal stenosis     Then saw Dr Yevette Edwards -- in his office  He reviewed the MRI with her and explained things well - but she did not completely understand them and was very nervous  Will be starting some physical therapy for at least 4 weeks Then considering shots  (? Epidural injections)  Told her surgery was not an option  She was unsatisfied  And  very nervous about the whole situation She continues to fall with a walker - is aware that PT would help this Needs px for a beside commode- for night time use with her difficulty moving around  She is aware her morbid obesity is a problem and she needs to loose weight  She is looking to try ali to loose weight - has looked into it    Currently on gabapentin and also norco- helping some  Patient Active Problem List  Diagnosis  . FUNGAL DERMATITIS  . HYPOTHYROIDISM  . DIABETES MELLITUS, TYPE II  . HYPERLIPIDEMIA  . OBESITY  . PANIC DISORDER  . DEPRESSION  . HYPERTENSION  . ALLERGIC RHINITIS  . FOLLICULITIS  . BACK PAIN  . EDEMA  . PULMONARY EMBOLISM, HX OF  . HIP PAIN, RIGHT  . BACK PAIN, LUMBAR  . Other screening mammogram  . Cellulitis and abscess of leg  . Sinusitis  . Vertigo  . Neck strain  . Wound of right leg  . Spinal stenosis of lumbar region   Past Medical History  Diagnosis Date  . Depression     mood disorder; ? bipolar  . Diabetes mellitus type II   . HLD (hyperlipidemia)   . HTN (hypertension)   . Hx pulmonary embolism     multiple  . Morbid obesity   . Allergic rhinitis, cause unspecified   . Backache, unspecified   . Malignant neoplasm of corpus uteri, except isthmus   . Edema   . Other specified disease of hair and hair  follicles   . Dermatomycosis, unspecified   . Pain in joint, pelvic region and thigh   . Panic disorder without agoraphobia   . Foot fracture, right   . Abnormal Pap smear of cervix    Past Surgical History  Procedure Date  . Tubal ligation   . Tonsillectomy   . Breast biopsy     Right-benign  . Endometrial biopsy 08/2006    attempted   History  Substance Use Topics  . Smoking status: Never Smoker   . Smokeless tobacco: Not on file  . Alcohol Use: No   Family History  Problem Relation Age of Onset  . Heart attack Father 34  . Pancreatic cancer Mother     with mets  . Hyperlipidemia Son   . Hyperlipidemia Son   . Gout Son    Allergies  Allergen Reactions  . Lamotrigine     REACTION: itch, allergy   Current Outpatient Prescriptions on File Prior to Visit  Medication Sig Dispense Refill  . B Complex-C (B-COMPLEX WITH VITAMIN C) tablet Take 1 tablet by mouth daily.        . Blood Glucose Monitoring Suppl (ONE TOUCH  ULTRA SYSTEM KIT) W/DEVICE KIT 1 kit by Does not apply route daily.        Marland Kitchen buPROPion (WELLBUTRIN XL) 150 MG 24 hr tablet Take 450 mg by mouth every morning.        . busPIRone (BUSPAR) 10 MG tablet Take 10 mg by mouth 2 (two) times daily.        . Butenafine HCl (MENTAX) 1 % cream Apply topically daily.        . cetirizine (ZYRTEC) 10 MG tablet Take 10 mg by mouth daily.        . cholecalciferol (VITAMIN D) 1000 UNITS tablet Take 1,000 Units by mouth daily.        . clonazePAM (KLONOPIN) 1 MG tablet Take 1 mg by mouth 3 (three) times daily as needed. For anxiety      . fish oil-omega-3 fatty acids 1000 MG capsule Take 1 g by mouth daily.        . fluticasone (FLONASE) 50 MCG/ACT nasal spray USE 2 SPRAYS IN EACH NOSTRIL EVERY DAY  16 mL  7  . gabapentin (NEURONTIN) 100 MG capsule Take 100 mg by mouth 3 (three) times daily.      . hydrochlorothiazide (HYDRODIURIL) 25 MG tablet Take 1 tablet (25 mg total) by mouth daily.  90 tablet  3  . levothyroxine  (SYNTHROID, LEVOTHROID) 50 MCG tablet Take 1 tablet (50 mcg total) by mouth daily.  90 tablet  3  . lisinopril (PRINIVIL,ZESTRIL) 20 MG tablet TAKE 1 TABLET BY MOUTH EVERY DAY  30 tablet  11  . Multiple Vitamin (MULTIVITAMIN) tablet Take 1 tablet by mouth daily.        . naproxen (NAPROSYN) 500 MG tablet Take one po bid x 1 week then prn pain, take with food  60 tablet  0  . NASAL SPRAY SALINE NA by Nasal route as directed.        . nystatin (MYCOSTATIN) cream APPLY TO AFFECTED AREAS ONCE DAILY AS NEEDED RASH  15 g  1  . omeprazole (PRILOSEC) 20 MG capsule TAKE 1 CAPSULE EVERY DAY  90 capsule  2  . ONE TOUCH ULTRA TEST test strip CHECK BLOOD SUGAR ONCE DAILY AND AS DIRECTED  100 each  3  . orlistat (ALLI) 60 MG capsule Take 60 mg by mouth 3 (three) times daily with meals.        . venlafaxine (EFFEXOR-XR) 150 MG 24 hr capsule Take 300 mg by mouth daily.        Marland Kitchen VYTORIN 10-20 MG per tablet TAKE 1 TABLET EVERY DAY  90 tablet  2       Review of Systems Review of Systems  Constitutional: Negative for fever, appetite change, fatigue and unexpected weight change.  Eyes: Negative for pain and visual disturbance.  Respiratory: Negative for cough and wheeze Cardiovascular: Negative for cp or palpitations    Gastrointestinal: Negative for nausea, diarrhea and constipation.  Genitourinary: Negative for urgency and frequency.  Skin: Negative for pallor or rash   MSK pos for low back pain. Severe at times that radiates into both legs Neurological: Negative for  light-headedness,and headaches. pos for numbness/ tingling in legs and overall weakness  Hematological: Negative for adenopathy. Does not bruise/bleed easily.  Psychiatric/Behavioral: pos for depression and anxiety that are about the same      Objective:   Physical Exam  Constitutional: She appears well-developed and well-nourished. No distress.       Morbidly obese anxious female  HENT:  Head: Normocephalic and atraumatic.    Mouth/Throat: Oropharynx is clear and moist.  Eyes: Conjunctivae and EOM are normal. Pupils are equal, round, and reactive to light. No scleral icterus.  Neck: Normal range of motion. No thyromegaly present.  Cardiovascular: Normal rate and regular rhythm.   Pulmonary/Chest: Effort normal and breath sounds normal.  Musculoskeletal: She exhibits tenderness. She exhibits no edema.       LS tenderness- over spinous processes and musculature  Poor rom LS and hips  Unable to stand and walk without assistance Unable to get up on the table Remains in wheelchair for duration of visit   Lymphadenopathy:    She has no cervical adenopathy.  Neurological: She is alert. She has normal reflexes. She exhibits normal muscle tone.  Skin: Skin is warm and dry. No rash noted.  Psychiatric: Judgment normal. Her mood appears anxious. Her affect is labile. Her speech is tangential. She exhibits a depressed mood. She expresses no homicidal and no suicidal ideation.       Very nervous and overall difficult to communicate with  Tangential in speech and does not pay attention  She is inattentive.          Assessment & Plan:

## 2012-04-12 NOTE — Patient Instructions (Signed)
If you change your mind and want to see another orthopedic doctor let me know  Otherwise follow through with the plan for PT and injections - Dr Yevette Edwards has proposed  Weight loss will be most important - also a positive attitude

## 2012-04-13 ENCOUNTER — Encounter: Payer: Self-pay | Admitting: Family Medicine

## 2012-04-13 NOTE — Assessment & Plan Note (Signed)
Again disc role of this problem in all of her health issues-especially back pain  Will pursue PT in hopes of eventually inc strength and exercise tolerance  Also disc diet in detail  Pt is very frustrated/ lacks motivation for lifestyle change

## 2012-04-13 NOTE — Assessment & Plan Note (Signed)
Causing pain and numbness in legs and much difficulty ambulating Gabapentin and norco are helping some Spent extensive time listening to pt's explanation of visit in ER and with ortho - and her dissatisfactions/ frustrations I agree with orthopedics that PT and injections are the appropriate tx  Also wt loss is most important She will follow the plan  Continue to use walker and wheelchair until she is stronger  Did write px for bedside commode which is medically necessary to prevent falls >25 min spent with face to face with patient, >50% counseling and/or coordinating care

## 2012-04-24 LAB — HM DIABETES FOOT EXAM

## 2012-06-29 ENCOUNTER — Other Ambulatory Visit: Payer: Self-pay | Admitting: Family Medicine

## 2012-07-11 ENCOUNTER — Other Ambulatory Visit: Payer: Self-pay | Admitting: *Deleted

## 2012-07-11 MED ORDER — LISINOPRIL 20 MG PO TABS: 20.0000 mg | ORAL_TABLET | Freq: Every day | ORAL | Status: DC

## 2012-08-09 ENCOUNTER — Other Ambulatory Visit: Payer: Self-pay | Admitting: *Deleted

## 2012-08-09 MED ORDER — FLUTICASONE PROPIONATE 50 MCG/ACT NA SUSP: 2.0000 | Freq: Every day | NASAL | Status: DC

## 2012-09-27 ENCOUNTER — Other Ambulatory Visit: Payer: Self-pay | Admitting: Family Medicine

## 2012-12-15 ENCOUNTER — Other Ambulatory Visit: Payer: Self-pay | Admitting: Family Medicine

## 2012-12-15 DIAGNOSIS — Z1231 Encounter for screening mammogram for malignant neoplasm of breast: Secondary | ICD-10-CM

## 2013-01-04 ENCOUNTER — Other Ambulatory Visit: Payer: Self-pay | Admitting: Family Medicine

## 2013-01-04 NOTE — Telephone Encounter (Signed)
Pt has an appt with [facility] in May, refilled med until she xfers care

## 2013-01-09 ENCOUNTER — Ambulatory Visit: Payer: Medicare Other

## 2013-01-27 IMAGING — CR DG HIP COMPLETE 2+V*R*
3 series · 3 of 3 positions shown · non-contrast
Comparison: No priors.

CLINICAL DATA: History of fall complaining of right-sided hip pain.

RIGHT HIP - COMPLETE 2+ VIEW

[t pelvis a.p. *]
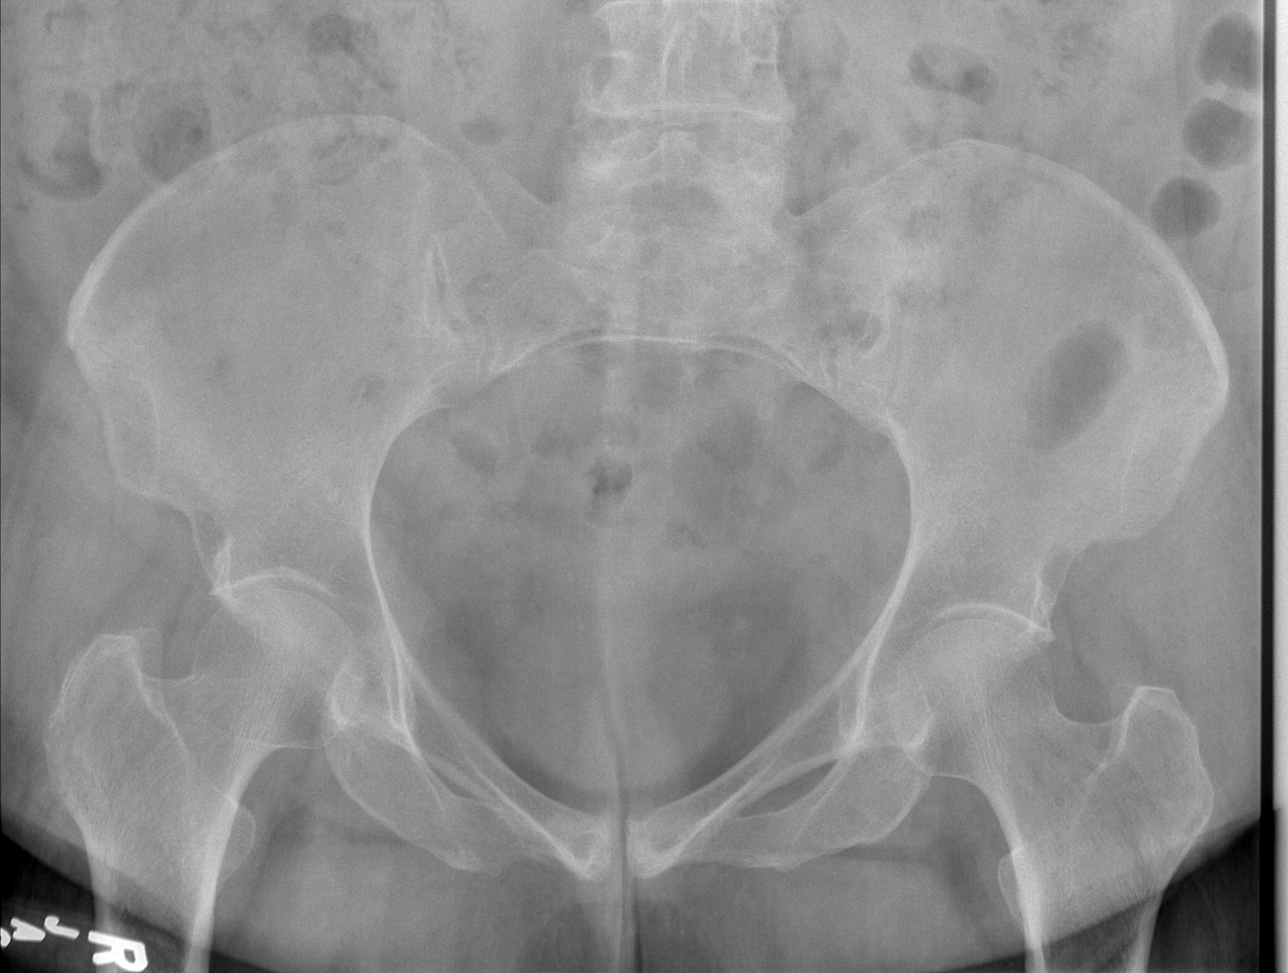

[t hip ap right *]
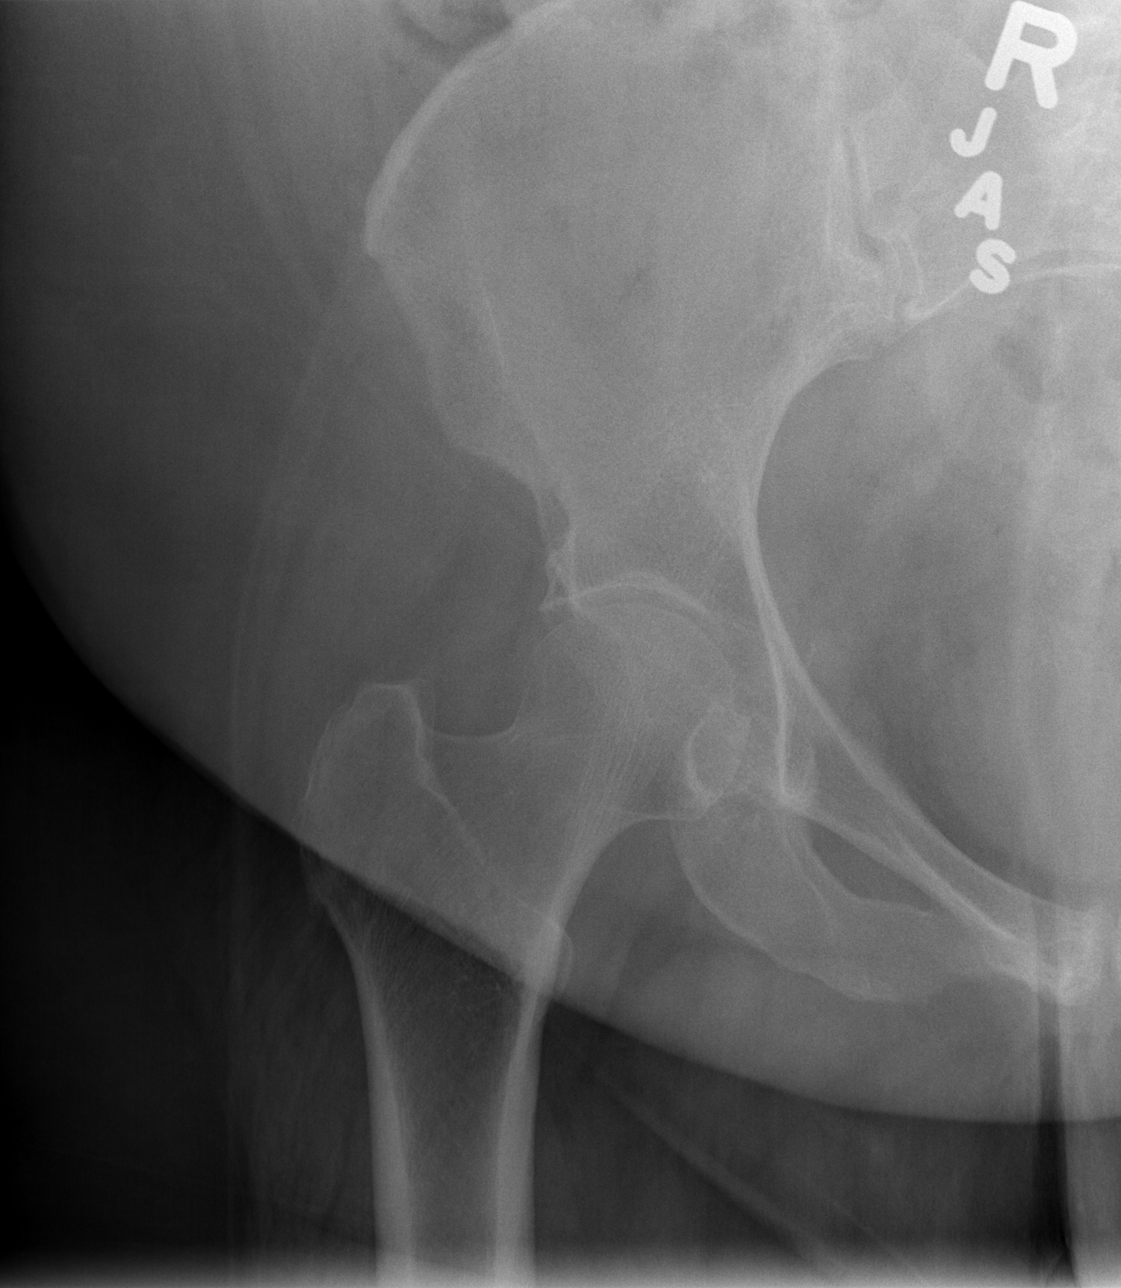

[t hip frog leg right *]
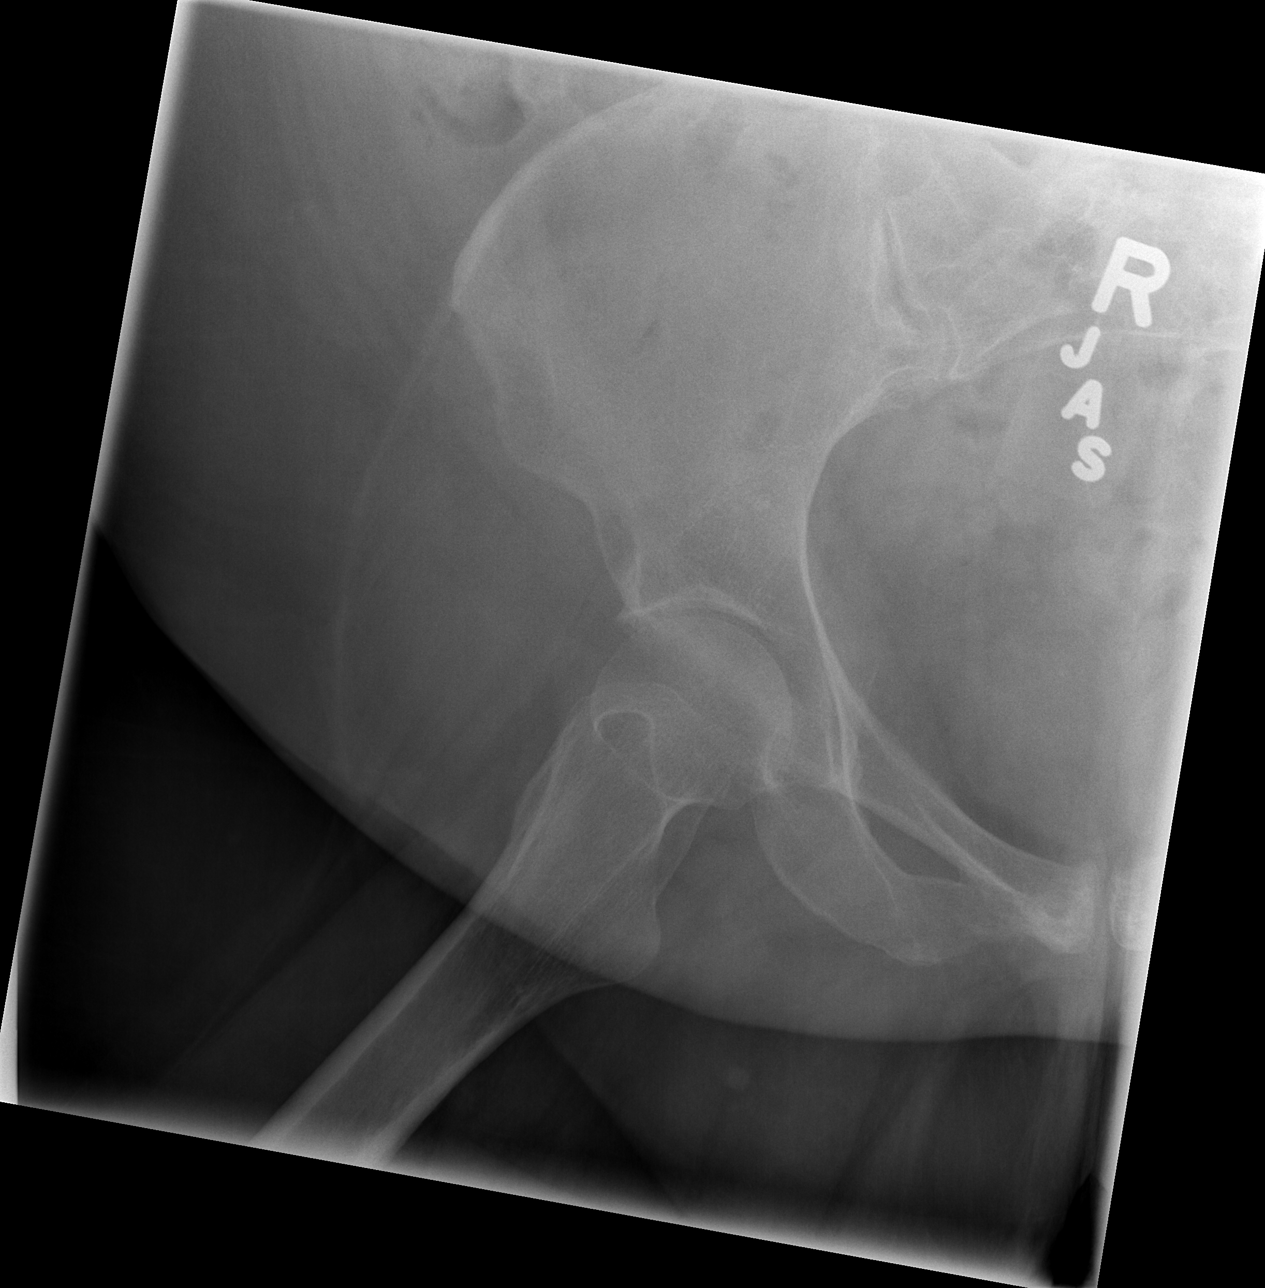

[3 of 3 positions shown; findings below may reference images not displayed]

FINDINGS: Single AP view of the pelvis and AP and lateral views of
the right hip demonstrate no acute displaced fracture of the pelvic
ring.  Bilateral proximal femurs as visualized appear intact.  The
right femoral head is properly located.  There are mild
degenerative changes of osteoarthritis are noted in the hip joints
bilaterally (right greater than left).
IMPRESSION: 1.  No acute radiographic abnormality of the bony pelvis where the
right hip.
2.  Mild bilateral hip joint osteoarthritis (right greater than
left).

## 2013-02-07 ENCOUNTER — Ambulatory Visit: Payer: Medicare Other

## 2013-02-14 ENCOUNTER — Other Ambulatory Visit: Payer: Self-pay | Admitting: Family Medicine

## 2013-02-22 ENCOUNTER — Ambulatory Visit (INDEPENDENT_AMBULATORY_CARE_PROVIDER_SITE_OTHER): Payer: Medicare Other | Admitting: Internal Medicine

## 2013-02-22 ENCOUNTER — Encounter: Payer: Self-pay | Admitting: Internal Medicine

## 2013-02-22 VITALS — BP 120/80 | HR 115 | Temp 98.1°F | Ht 65.75 in | Wt 345.5 lb

## 2013-02-22 DIAGNOSIS — I10 Essential (primary) hypertension: Secondary | ICD-10-CM

## 2013-02-22 DIAGNOSIS — E039 Hypothyroidism, unspecified: Secondary | ICD-10-CM

## 2013-02-22 DIAGNOSIS — Z1239 Encounter for other screening for malignant neoplasm of breast: Secondary | ICD-10-CM

## 2013-02-22 DIAGNOSIS — E785 Hyperlipidemia, unspecified: Secondary | ICD-10-CM

## 2013-02-22 DIAGNOSIS — F329 Major depressive disorder, single episode, unspecified: Secondary | ICD-10-CM

## 2013-02-22 DIAGNOSIS — F3289 Other specified depressive episodes: Secondary | ICD-10-CM

## 2013-02-22 DIAGNOSIS — F410 Panic disorder [episodic paroxysmal anxiety] without agoraphobia: Secondary | ICD-10-CM

## 2013-02-22 DIAGNOSIS — E119 Type 2 diabetes mellitus without complications: Secondary | ICD-10-CM

## 2013-02-22 DIAGNOSIS — R609 Edema, unspecified: Secondary | ICD-10-CM

## 2013-02-22 DIAGNOSIS — M48061 Spinal stenosis, lumbar region without neurogenic claudication: Secondary | ICD-10-CM

## 2013-02-25 ENCOUNTER — Encounter: Payer: Self-pay | Admitting: Internal Medicine

## 2013-02-25 NOTE — Assessment & Plan Note (Signed)
Followed by Theresa Blake.  She prescribes her psych meds.  Stable.  Is evaluated every 3-6 months.

## 2013-02-25 NOTE — Assessment & Plan Note (Signed)
Stable

## 2013-02-25 NOTE — Assessment & Plan Note (Signed)
Does not check her sugars.  Discussed diabetic diet.  Check metabolic panel and a1c.

## 2013-02-25 NOTE — Assessment & Plan Note (Signed)
Blood pressure is under good control. Same medication regimen. Follow.

## 2013-02-25 NOTE — Assessment & Plan Note (Signed)
Followed by Johnell Comings.  Stable.

## 2013-02-25 NOTE — Assessment & Plan Note (Signed)
Discussed the need for weight loss 

## 2013-02-25 NOTE — Assessment & Plan Note (Signed)
Has known spinal stenosis per MRI.  Saw orthopedic surgery.  Not a surgical candidate.  We discussed therapy today.  Will arrange for home health physical therapy.  Pt does not drive.  Avoid narcotic pain meds if possible.

## 2013-02-25 NOTE — Assessment & Plan Note (Signed)
Low cholesterol diet.  Check lipid panel.    

## 2013-02-25 NOTE — Assessment & Plan Note (Signed)
On replacement.  Follow tsh.  

## 2013-02-25 NOTE — Progress Notes (Signed)
Subjective:    Patient ID: Theresa Blake, female    DOB: January 27, 1941, 72 y.o.   MRN: [id number]  HPI 72 year old female with past history of lumbar spinal stenosis, hypothyroidism, diabetes mellitus, hypercholesterolemia, panic disorder, depression and hypertension who comes in today to follow up on these issues as well as to establish care.  She has previously been seeing Dr Milinda Antis.  She is also followed by a therapist Misty Stanley Poulous ([facility] in [place]) - who prescribes her psych meds.  Has a history of depression.  Sees her every 3-6 months.  States she feels this is stable.  She does not check her sugars.  We discussed diet adjustment and weight loss today.  She lives with her son.  Does not drive.  Her main complaint is that of back pain and pain in her legs/fet.  Feet numb.  She has been evaluated extensively.  Had MRI which revealed spinal stenosis.  Saw Dr Yevette Edwards.  States not a surgical candidate.  She states she was referred to Sports Medicine.  She is using her walker now to get around and does not doe much walking.  Some decreased energy.  Breathing stable.  No chest pain.  No reported bowel issues today.    Past Medical History  Diagnosis Date  . Depression     mood disorder; ? bipolar  . Diabetes mellitus type II   . HLD (hyperlipidemia)   . HTN (hypertension)   . Hx pulmonary embolism     multiple  . Morbid obesity   . Allergic rhinitis, cause unspecified   . Backache, unspecified   . Malignant neoplasm of corpus uteri, except isthmus   . Edema   . Other specified disease of hair and hair follicles   . Dermatomycosis, unspecified   . Pain in joint, pelvic region and thigh   . Panic disorder without agoraphobia   . Foot fracture, right   . Abnormal Pap smear of cervix   . Lumbar spinal stenosis     Outpatient Encounter Prescriptions as of 02/22/2013  Medication Sig Dispense Refill  . B Complex-C (B-COMPLEX WITH VITAMIN C) tablet Take 1 tablet by mouth daily.        .  Blood Glucose Monitoring Suppl (ONE TOUCH ULTRA SYSTEM KIT) W/DEVICE KIT 1 kit by Does not apply route daily.        Marland Kitchen buPROPion (WELLBUTRIN XL) 150 MG 24 hr tablet Take 450 mg by mouth every morning.        . cetirizine (ZYRTEC) 10 MG tablet Take 10 mg by mouth daily.        . clonazePAM (KLONOPIN) 1 MG tablet Take 1 mg by mouth 3 (three) times daily as needed. For anxiety      . fish oil-omega-3 fatty acids 1000 MG capsule Take 1 g by mouth daily.        . fluticasone (FLONASE) 50 MCG/ACT nasal spray Place 2 sprays into the nose daily.  48 g  1  . hydrochlorothiazide (HYDRODIURIL) 25 MG tablet Take 1 tablet (25 mg total) by mouth daily.  90 tablet  3  . levothyroxine (SYNTHROID, LEVOTHROID) 50 MCG tablet TAKE 1 TABLET BY MOUTH EVERY DAY  90 tablet  0  . lisinopril (PRINIVIL,ZESTRIL) 20 MG tablet TAKE 1 TABLET (20 MG TOTAL) BY MOUTH DAILY.  90 tablet  0  . Multiple Vitamin (MULTIVITAMIN) tablet Take 1 tablet by mouth daily.        . naproxen sodium (ANAPROX)  220 MG tablet Take 220 mg by mouth 2 (two) times daily with a meal.      . NASAL SPRAY SALINE NA by Nasal route as directed.        Marland Kitchen omeprazole (PRILOSEC) 20 MG capsule TAKE 1 CAPSULE EVERY DAY  90 capsule  2  . ONE TOUCH ULTRA TEST test strip CHECK BLOOD SUGAR ONCE DAILY AND AS DIRECTED  100 each  3  . venlafaxine (EFFEXOR-XR) 150 MG 24 hr capsule Take 300 mg by mouth daily.        Marland Kitchen VYTORIN 10-20 MG per tablet TAKE 1 TABLET EVERY DAY  90 tablet  2  . [DISCONTINUED] busPIRone (BUSPAR) 10 MG tablet Take 10 mg by mouth 2 (two) times daily.        . Butenafine HCl (MENTAX) 1 % cream Apply topically daily.        . cholecalciferol (VITAMIN D) 1000 UNITS tablet Take 1,000 Units by mouth daily.        Marland Kitchen gabapentin (NEURONTIN) 100 MG capsule Take 100 mg by mouth 3 (three) times daily.      Marland Kitchen nystatin (MYCOSTATIN) cream APPLY TO AFFECTED AREAS ONCE DAILY AS NEEDED RASH  15 g  1  . [DISCONTINUED] HYDROcodone-acetaminophen (NORCO) 5-325 MG per  tablet Take 1-2 tablets by mouth every 6 (six) hours as needed.      . [DISCONTINUED] orlistat (ALLI) 60 MG capsule Take 60 mg by mouth 3 (three) times daily with meals.         No facility-administered encounter medications on file as of 02/22/2013.    Review of Systems Patient denies any headache, lightheadedness or dizziness.  No significant sinus or allergy symptoms reported.  No chest pain, tightness or palpitations.  No increased shortness of breath, cough or congestion reported.  Breathing stable.  No nausea or vomiting.  No acid reflux. No abdominal pain or cramping.  No bowel change, such as diarrhea, constipation, BRBPR or melana.  No urine change.  Her main complaint is that of back pain.  Limited mobility.  Using her walker.  Some increased fatigue.       Objective:   Physical Exam Filed Vitals:   02/22/13 1401  BP: 120/80  Pulse: 115  Temp: 98.1 F (74.30 C)   72 year old female in no acute distress.   HEENT:  Nares- clear.  Oropharynx - without lesions. NECK:  Supple.  Nontender.  No audible bruit.  HEART:  Appears to be regular. LUNGS:  No crackles or wheezing audible.  Respirations even and unlabored.  RADIAL PULSE:  Equal bilaterally.    ABDOMEN:  Soft, nontender.  Bowel sounds present and normal.  No audible abdominal bruit.    EXTREMITIES:  No increased erythema or warmth of the lower extremities.          Assessment & Plan:  HEALTH MAINTENANCE.  Schedule her for a physical next visit.  Schedule mammogram.  Review records from previous MD.  Never has had colonoscopy.

## 2013-03-02 ENCOUNTER — Telehealth: Payer: Self-pay

## 2013-03-02 NOTE — Telephone Encounter (Signed)
Darl Pikes from [facility] called about the patient wanting to know would it be okay for there staff to help assist the patient with physical therapy to help with mobility and nursing.

## 2013-03-02 NOTE — Telephone Encounter (Signed)
Notified Darl Pikes that if fine to do physical therapy per Dr. Lorin Picket

## 2013-03-02 NOTE — Telephone Encounter (Signed)
Yes, I want her to do physical therapy.

## 2013-03-05 ENCOUNTER — Telehealth: Payer: Self-pay

## 2013-03-05 NOTE — Telephone Encounter (Signed)
Late entry.  Spoke to pt (03/05/13 - 1730).  She feels fine.  States she was just anxious when the blood pressure was checked.  The therapist had checked her blood pressure and it was ok.  Is scheduled to come in tomorrow for labs (approximately 9:30 am).  Please check her blood pressure.  Will start as a nurse visit.  If elevated, I will need to see her.  Thanks.

## 2013-03-05 NOTE — Telephone Encounter (Signed)
Call of Nurse Debra C. Called she went out to see the patient today and her blood pressure was 170/110 patient did not have any symptoms of not feeling well or dizzy. Patient does come in tomorrow for labs is there any way we can squeeze her in tomorrow to see how bp is tomorrow.

## 2013-03-06 ENCOUNTER — Telehealth: Payer: Self-pay | Admitting: *Deleted

## 2013-03-06 ENCOUNTER — Ambulatory Visit: Payer: Medicare Other | Admitting: *Deleted

## 2013-03-06 ENCOUNTER — Other Ambulatory Visit (INDEPENDENT_AMBULATORY_CARE_PROVIDER_SITE_OTHER): Payer: Medicare Other

## 2013-03-06 VITALS — BP 130/70

## 2013-03-06 DIAGNOSIS — E119 Type 2 diabetes mellitus without complications: Secondary | ICD-10-CM

## 2013-03-06 DIAGNOSIS — E785 Hyperlipidemia, unspecified: Secondary | ICD-10-CM

## 2013-03-06 DIAGNOSIS — I10 Essential (primary) hypertension: Secondary | ICD-10-CM

## 2013-03-06 DIAGNOSIS — E039 Hypothyroidism, unspecified: Secondary | ICD-10-CM

## 2013-03-06 LAB — HEMOGLOBIN A1C: Hgb A1c MFr Bld: 7 % — ABNORMAL HIGH (ref 4.6–6.5)

## 2013-03-06 LAB — HEPATIC FUNCTION PANEL
Bilirubin, Direct: 0.1 mg/dL (ref 0.0–0.3)
Total Bilirubin: 0.5 mg/dL (ref 0.3–1.2)
Total Protein: 7.2 g/dL (ref 6.0–8.3)

## 2013-03-06 LAB — CBC WITH DIFFERENTIAL/PLATELET
Basophils Relative: 0.2 % (ref 0.0–3.0)
Eosinophils Relative: 3.2 % (ref 0.0–5.0)
Lymphocytes Relative: 32.5 % (ref 12.0–46.0)
Monocytes Relative: 8 % (ref 3.0–12.0)
Neutrophils Relative %: 56.1 % (ref 43.0–77.0)
RBC: 5.21 Mil/uL — ABNORMAL HIGH (ref 3.87–5.11)
WBC: 11.7 10*3/uL — ABNORMAL HIGH (ref 4.5–10.5)

## 2013-03-06 LAB — LIPID PANEL
Cholesterol: 185 mg/dL (ref 0–200)
LDL Cholesterol: 87 mg/dL (ref 0–99)
VLDL: 38.2 mg/dL (ref 0.0–40.0)

## 2013-03-06 LAB — BASIC METABOLIC PANEL
BUN: 14 mg/dL (ref 6–23)
Chloride: 102 mEq/L (ref 96–112)
Glucose, Bld: 185 mg/dL — ABNORMAL HIGH (ref 70–99)
Potassium: 4 mEq/L (ref 3.5–5.1)

## 2013-03-06 MED ORDER — GLUCOSE BLOOD VI STRP: ORAL_STRIP | Status: DC

## 2013-03-06 NOTE — Telephone Encounter (Signed)
Rx faxed

## 2013-03-06 NOTE — Telephone Encounter (Signed)
Nurse visit scheduled after lab appt today to check BP

## 2013-03-06 NOTE — Telephone Encounter (Signed)
rx complete for test strips.  This is the brand test strips we had in our system.  Printed and on your desk

## 2013-03-06 NOTE — Telephone Encounter (Signed)
Pt was seen today for nurse visit BP check was 130/70

## 2013-03-06 NOTE — Telephone Encounter (Signed)
Pt came in for labs request a refill  Thin Lancets  #[phone]   Check sugar twice a day or as needed for diabetes

## 2013-03-07 ENCOUNTER — Other Ambulatory Visit: Payer: Self-pay | Admitting: Internal Medicine

## 2013-03-07 ENCOUNTER — Telehealth: Payer: Self-pay | Admitting: *Deleted

## 2013-03-07 ENCOUNTER — Encounter: Payer: Self-pay | Admitting: Internal Medicine

## 2013-03-07 DIAGNOSIS — D72829 Elevated white blood cell count, unspecified: Secondary | ICD-10-CM

## 2013-03-07 LAB — MICROALBUMIN / CREATININE URINE RATIO: Microalb, Ur: 0.9 mg/dL (ref 0.0–1.9)

## 2013-03-07 NOTE — Telephone Encounter (Signed)
Spoke with pharmacy, they stated that pts current glucose meter & test strips are no longer covered. Need a new rx for Accucheck Aviva for the meter, test strips, & lancets

## 2013-03-07 NOTE — Telephone Encounter (Signed)
Pt notified of lab results via my chart and need for a f/u cbc in two weeks.  Please schedule her for a non fasting lab appt in two weeks.  Thanks.

## 2013-03-07 NOTE — Telephone Encounter (Signed)
rx written and in your box.   

## 2013-03-07 NOTE — Telephone Encounter (Signed)
Pt informed of labs (given verbally). Scheduled lab appt on 5/28 @ 11:30 & pt aware that we will change her Glucose test strips & supplies to Accucheck

## 2013-03-07 NOTE — Telephone Encounter (Signed)
Faxed RX for Accuchek Aviva Glucometer (0 RF), Aviva lancets & test strips to check blood sugar BID (3 mth supply)

## 2013-03-08 ENCOUNTER — Encounter: Payer: Self-pay | Admitting: Emergency Medicine

## 2013-03-12 ENCOUNTER — Telehealth: Payer: Self-pay | Admitting: Internal Medicine

## 2013-03-12 NOTE — Telephone Encounter (Signed)
Refill One Touch Ultra Smart Test Strips .

## 2013-03-13 NOTE — Telephone Encounter (Signed)
Refill called into the pharmacy. 

## 2013-03-20 ENCOUNTER — Other Ambulatory Visit: Payer: Self-pay | Admitting: Family Medicine

## 2013-03-20 NOTE — Telephone Encounter (Signed)
Rout to PCP 

## 2013-03-21 ENCOUNTER — Other Ambulatory Visit (INDEPENDENT_AMBULATORY_CARE_PROVIDER_SITE_OTHER): Payer: Medicare Other

## 2013-03-21 ENCOUNTER — Encounter: Payer: Self-pay | Admitting: Internal Medicine

## 2013-03-21 DIAGNOSIS — D72829 Elevated white blood cell count, unspecified: Secondary | ICD-10-CM

## 2013-03-21 LAB — CBC WITH DIFFERENTIAL/PLATELET
Basophils Absolute: 0 10*3/uL (ref 0.0–0.1)
Eosinophils Absolute: 0.2 10*3/uL (ref 0.0–0.7)
Lymphocytes Relative: 34.1 % (ref 12.0–46.0)
MCHC: 33.2 g/dL (ref 30.0–36.0)
Neutrophils Relative %: 54.6 % (ref 43.0–77.0)
RDW: 14.1 % (ref 11.5–14.6)

## 2013-03-23 ENCOUNTER — Telehealth: Payer: Self-pay

## 2013-03-23 NOTE — Telephone Encounter (Signed)
My Chart Message: Your white blood cell count is within normal limits. Labs are ok  Notified patient of recent lab.

## 2013-04-05 ENCOUNTER — Ambulatory Visit: Payer: Self-pay | Admitting: Internal Medicine

## 2013-04-09 ENCOUNTER — Encounter: Payer: Self-pay | Admitting: Internal Medicine

## 2013-04-10 ENCOUNTER — Other Ambulatory Visit: Payer: Self-pay | Admitting: *Deleted

## 2013-04-10 MED ORDER — LISINOPRIL 20 MG PO TABS: ORAL_TABLET | ORAL | Status: DC

## 2013-04-19 ENCOUNTER — Other Ambulatory Visit: Payer: Self-pay | Admitting: Internal Medicine

## 2013-04-20 ENCOUNTER — Encounter: Payer: Self-pay | Admitting: Internal Medicine

## 2013-04-20 NOTE — Telephone Encounter (Signed)
Eprescribed.

## 2013-04-26 ENCOUNTER — Other Ambulatory Visit (HOSPITAL_COMMUNITY)
Admission: RE | Admit: 2013-04-26 | Discharge: 2013-04-26 | Disposition: A | Discharge status: Home / Self Care | Payer: Medicare Other | Source: Ambulatory Visit | Attending: Internal Medicine | Admitting: Internal Medicine

## 2013-04-26 ENCOUNTER — Ambulatory Visit (INDEPENDENT_AMBULATORY_CARE_PROVIDER_SITE_OTHER): Payer: Medicare Other | Admitting: Internal Medicine

## 2013-04-26 ENCOUNTER — Encounter: Payer: Self-pay | Admitting: Internal Medicine

## 2013-04-26 VITALS — BP 118/80 | HR 100 | Temp 98.4°F | Ht 65.0 in | Wt 331.8 lb

## 2013-04-26 DIAGNOSIS — M48061 Spinal stenosis, lumbar region without neurogenic claudication: Secondary | ICD-10-CM

## 2013-04-26 DIAGNOSIS — Z1151 Encounter for screening for human papillomavirus (HPV): Secondary | ICD-10-CM | POA: Insufficient documentation

## 2013-04-26 DIAGNOSIS — F329 Major depressive disorder, single episode, unspecified: Secondary | ICD-10-CM

## 2013-04-26 DIAGNOSIS — F410 Panic disorder [episodic paroxysmal anxiety] without agoraphobia: Secondary | ICD-10-CM

## 2013-04-26 DIAGNOSIS — E039 Hypothyroidism, unspecified: Secondary | ICD-10-CM

## 2013-04-26 DIAGNOSIS — E119 Type 2 diabetes mellitus without complications: Secondary | ICD-10-CM

## 2013-04-26 DIAGNOSIS — Z124 Encounter for screening for malignant neoplasm of cervix: Secondary | ICD-10-CM

## 2013-04-26 DIAGNOSIS — I10 Essential (primary) hypertension: Secondary | ICD-10-CM

## 2013-04-26 DIAGNOSIS — Z01419 Encounter for gynecological examination (general) (routine) without abnormal findings: Secondary | ICD-10-CM | POA: Insufficient documentation

## 2013-04-26 DIAGNOSIS — R0902 Hypoxemia: Secondary | ICD-10-CM

## 2013-04-26 DIAGNOSIS — R609 Edema, unspecified: Secondary | ICD-10-CM

## 2013-04-26 DIAGNOSIS — E785 Hyperlipidemia, unspecified: Secondary | ICD-10-CM

## 2013-04-29 ENCOUNTER — Encounter: Payer: Self-pay | Admitting: Internal Medicine

## 2013-04-29 DIAGNOSIS — R0902 Hypoxemia: Secondary | ICD-10-CM | POA: Insufficient documentation

## 2013-04-29 NOTE — Assessment & Plan Note (Signed)
Stable

## 2013-04-29 NOTE — Assessment & Plan Note (Signed)
Followed by Johnell Comings.  She prescribes her psych meds.  Stable.  Is evaluated every 3-6 months.

## 2013-04-29 NOTE — Progress Notes (Signed)
Subjective:    Patient ID: Theresa Blake, female    DOB: Dec 19, 1940, 72 y.o.   MRN: [id number]  HPI 72 year old female with past history of lumbar spinal stenosis, hypothyroidism, diabetes mellitus, hypercholesterolemia, panic disorder, depression and hypertension who comes in today to follow up on these issues as well as for a complete physical exam.  She is followed by a therapist Misty Stanley Poulous ([facility] in [place]) - who prescribes her psych meds.  Has a history of depression.  Sees her every 3-6 months.  States she feels this is stable.  She lives with her son.  Does not drive.  Her main complaint last visit was back pain and pain in her legs/fet.  Feet numb.  She has been evaluated extensively.  Had MRI which revealed spinal stenosis.  Saw Dr Yevette Edwards.  States not a surgical candidate.  She states she was referred to Sports Medicine.  She is using her walker now to get around and does not do much walking.  We set her up with physical therapy.  She does feel this has helped.  She feels breathing is stable.  Was noticed to have a low O2 sat at the beginning of this visit.  No chest pain.  No reported bowel issues today.  She does report she is trying to do better regarding her diet and her sugars.  She has lost 12 pounds (per her report).  States it is coming off gradually.     Past Medical History  Diagnosis Date  . Depression     mood disorder; ? bipolar  . Diabetes mellitus type II   . HLD (hyperlipidemia)   . HTN (hypertension)   . Hx pulmonary embolism     multiple  . Morbid obesity   . Allergic rhinitis, cause unspecified   . Backache, unspecified   . Malignant neoplasm of corpus uteri, except isthmus   . Edema   . Other specified disease of hair and hair follicles   . Dermatomycosis, unspecified   . Pain in joint, pelvic region and thigh   . Panic disorder without agoraphobia   . Foot fracture, right   . Abnormal Pap smear of cervix   . Lumbar spinal stenosis     Outpatient  Encounter Prescriptions as of 04/26/2013  Medication Sig Dispense Refill  . B Complex-C (B-COMPLEX WITH VITAMIN C) tablet Take 1 tablet by mouth daily.        . Blood Glucose Monitoring Suppl (ACCU-CHEK AVIVA PLUS) W/DEVICE KIT by Does not apply route.      Marland Kitchen buPROPion (WELLBUTRIN XL) 150 MG 24 hr tablet Take 450 mg by mouth every morning.        . Butenafine HCl (MENTAX) 1 % cream Apply topically daily.        . cetirizine (ZYRTEC) 10 MG tablet Take 10 mg by mouth daily.        . cholecalciferol (VITAMIN D) 1000 UNITS tablet Take 1,000 Units by mouth daily.        . clonazePAM (KLONOPIN) 1 MG tablet Take 1 mg by mouth 3 (three) times daily as needed. For anxiety      . fish oil-omega-3 fatty acids 1000 MG capsule Take 1 g by mouth daily.        . fluticasone (FLONASE) 50 MCG/ACT nasal spray PLACE 2 SPRAYS INTO THE NOSE DAILY.  16 g  2  . gabapentin (NEURONTIN) 100 MG capsule Take 100 mg by mouth 3 (three) times daily.      Marland Kitchen  glucose blood (ACCU-CHEK AVIVA PLUS) test strip 1 each by Other route as needed for other. Use as instructed      . hydrochlorothiazide (HYDRODIURIL) 25 MG tablet Take 1 tablet (25 mg total) by mouth daily.  90 tablet  3  . levothyroxine (SYNTHROID, LEVOTHROID) 50 MCG tablet TAKE 1 TABLET BY MOUTH EVERY DAY  90 tablet  0  . lisinopril (PRINIVIL,ZESTRIL) 20 MG tablet TAKE 1 TABLET (20 MG TOTAL) BY MOUTH DAILY.  90 tablet  1  . Multiple Vitamin (MULTIVITAMIN) tablet Take 1 tablet by mouth daily.        . naproxen sodium (ANAPROX) 220 MG tablet Take 220 mg by mouth 2 (two) times daily with a meal.      . NASAL SPRAY SALINE NA by Nasal route as directed.        . nystatin (MYCOSTATIN) cream APPLY TO AFFECTED AREAS ONCE DAILY AS NEEDED RASH  15 g  1  . omeprazole (PRILOSEC) 20 MG capsule TAKE 1 CAPSULE EVERY DAY  90 capsule  2  . venlafaxine (EFFEXOR-XR) 150 MG 24 hr capsule Take 300 mg by mouth daily.        Marland Kitchen VYTORIN 10-20 MG per tablet TAKE 1 TABLET EVERY DAY  90 tablet  2    No facility-administered encounter medications on file as of 04/26/2013.    Review of Systems Patient denies any headache, lightheadedness or dizziness.  No significant sinus or allergy symptoms reported.  No chest pain, tightness or palpitations.  No increased shortness of breath, cough or congestion reported.  Breathing stable.  No nausea or vomiting.  No acid reflux. No abdominal pain or cramping.  No bowel change, such as diarrhea, constipation, BRBPR or melana.  No urine change.  Back pain.  Limited mobility.  Using her walker.  Home physical therapy helped.        Objective:   Physical Exam  Filed Vitals:   04/26/13 1428  BP: 118/80  Pulse: 100  Temp: 98.4 F (36.9 C)   Blood pressure recheck:  120/76, pulse 96-100 O2 sat recheck:  4%  72 year old female in no acute distress.   HEENT:  Nares- clear.  Oropharynx - without lesions. NECK:  Supple.  Nontender.  No audible bruit.  HEART:  Appears to be regular. LUNGS:  No crackles or wheezing audible.  Respirations even and unlabored.  RADIAL PULSE:  Equal bilaterally.    BREASTS:  No nipple discharge or nipple retraction present.  Could not appreciate any distinct nodules or axillary adenopathy.  ABDOMEN:  Soft, nontender.  Bowel sounds present and normal.  No audible abdominal bruit.  GU:  Normal external genitalia.  Vaginal vault without lesions.  Cervix identified.  Pap performed. Could not appreciate any adnexal masses or tenderness.   RECTAL:  Heme negative.   EXTREMITIES:  No increased edema present.  Stable.           Assessment & Plan:  HEALTH MAINTENANCE.  Physical today.  Pap today.  Pt requested.  Mammogram 04/05/13 - Birads II.  Never has had colonoscopy.  Needs.   I spent over 40 minutes with this pt and more than 50% of the time was spent in consultation regarding the above.

## 2013-04-29 NOTE — Assessment & Plan Note (Addendum)
Initial o2 sat low today.  O2 sat recheck 91%.  Concern over decrease at home.  Will check an overnight oximetry to start.  Follow.

## 2013-04-29 NOTE — Assessment & Plan Note (Signed)
Followed by Johnell Comings.  Stable.

## 2013-04-29 NOTE — Assessment & Plan Note (Signed)
On replacement.  Follow tsh.  

## 2013-04-29 NOTE — Assessment & Plan Note (Signed)
Have discussed the need for weight loss.  She is losing weight gradually.  Encouraged her to continue.

## 2013-04-29 NOTE — Assessment & Plan Note (Signed)
Have discussed diabetic diet.  She is trying to watch what she eats.  No recorded sugars.  A1c just checked 03/06/13 - 7.0.  Continue weight loss and diet adjustment.

## 2013-04-29 NOTE — Assessment & Plan Note (Signed)
Blood pressure is under good control. Same medication regimen. Follow.

## 2013-04-29 NOTE — Assessment & Plan Note (Signed)
Has known spinal stenosis per MRI.  Saw orthopedic surgery.  Not a surgical candidate.   Avoid narcotic pain meds if possible.  Had home physical therapy.  Helped.  Continue exercises.

## 2013-04-29 NOTE — Assessment & Plan Note (Signed)
Low cholesterol diet.  Follow lipid panel.  Lipid panel checked 03/06/13 revealed total cholesterol 185, triglycerides 191, HDL 60 and LDL 87.

## 2013-05-03 ENCOUNTER — Encounter: Payer: Self-pay | Admitting: Internal Medicine

## 2013-05-07 NOTE — Telephone Encounter (Signed)
Mailed unread message to pt  

## 2013-05-29 ENCOUNTER — Other Ambulatory Visit: Payer: Self-pay | Admitting: Family Medicine

## 2013-05-29 NOTE — Telephone Encounter (Signed)
Rout to PCP 

## 2013-05-29 NOTE — Telephone Encounter (Signed)
Refilled hctz #90 with one refill

## 2013-05-30 ENCOUNTER — Encounter: Payer: Self-pay | Admitting: *Deleted

## 2013-06-08 ENCOUNTER — Telehealth: Payer: Self-pay | Admitting: Internal Medicine

## 2013-06-08 NOTE — Telephone Encounter (Signed)
appt scheduled

## 2013-06-08 NOTE — Telephone Encounter (Signed)
She will need to be seen for this.  Unable to call in medications.  I can see her at 2:45 on Monday, but I think she needs eval prior to this.

## 2013-06-08 NOTE — Telephone Encounter (Signed)
Pt states she has had to urinate every 30 minutes x 3 days.  Pt states she has had some light tinges of blood and has worn a thin [place] pad.  Pt asking if she can have Kathlene November bring in a urine specimen for her.  Pt states she cannot come in today.

## 2013-06-11 ENCOUNTER — Ambulatory Visit (INDEPENDENT_AMBULATORY_CARE_PROVIDER_SITE_OTHER): Payer: Medicare Other | Admitting: Internal Medicine

## 2013-06-11 ENCOUNTER — Encounter: Payer: Self-pay | Admitting: Internal Medicine

## 2013-06-11 VITALS — BP 118/70 | HR 92 | Temp 98.9°F | Ht 65.0 in

## 2013-06-11 DIAGNOSIS — M4800 Spinal stenosis, site unspecified: Secondary | ICD-10-CM

## 2013-06-11 DIAGNOSIS — F329 Major depressive disorder, single episode, unspecified: Secondary | ICD-10-CM

## 2013-06-11 DIAGNOSIS — N390 Urinary tract infection, site not specified: Secondary | ICD-10-CM

## 2013-06-11 DIAGNOSIS — E119 Type 2 diabetes mellitus without complications: Secondary | ICD-10-CM

## 2013-06-11 DIAGNOSIS — M48061 Spinal stenosis, lumbar region without neurogenic claudication: Secondary | ICD-10-CM

## 2013-06-11 DIAGNOSIS — I10 Essential (primary) hypertension: Secondary | ICD-10-CM

## 2013-06-11 DIAGNOSIS — E785 Hyperlipidemia, unspecified: Secondary | ICD-10-CM

## 2013-06-11 DIAGNOSIS — E039 Hypothyroidism, unspecified: Secondary | ICD-10-CM

## 2013-06-11 LAB — POCT URINALYSIS DIPSTICK
Bilirubin, UA: NEGATIVE
Glucose, UA: NEGATIVE
Nitrite, UA: POSITIVE
Urobilinogen, UA: 0.2

## 2013-06-11 MED ORDER — CIPROFLOXACIN HCL 500 MG PO TABS: 500.0000 mg | ORAL_TABLET | Freq: Two times a day (BID) | ORAL | Status: DC

## 2013-06-11 NOTE — Telephone Encounter (Signed)
Mailed unread message to pt  

## 2013-06-12 ENCOUNTER — Encounter: Payer: Self-pay | Admitting: Internal Medicine

## 2013-06-12 NOTE — Assessment & Plan Note (Signed)
Have discussed diabetic diet.  She is trying to watch what she eats.  No recorded sugars.  A1c just checked 03/06/13 - 7.0.  Continue weight loss and diet adjustment.     

## 2013-06-12 NOTE — Assessment & Plan Note (Signed)
Followed by Theresa Blake.

## 2013-06-12 NOTE — Assessment & Plan Note (Signed)
Has known spinal stenosis per MRI.  Saw orthopedic surgery.  Not a surgical candidate.   Avoid narcotic pain meds if possible.  Had home physical therapy.  Helped.  Continue exercises.  Refer to Dr Yves Dill for further evaluation and treatment.  Pt is agreement.

## 2013-06-12 NOTE — Assessment & Plan Note (Signed)
Blood pressure is under good control. Same medication regimen. Follow.  

## 2013-06-12 NOTE — Assessment & Plan Note (Signed)
Low cholesterol diet.  Follow lipid panel.  Lipid panel checked 03/06/13 revealed total cholesterol 185, triglycerides 191, HDL 60 and LDL 87.     

## 2013-06-12 NOTE — Assessment & Plan Note (Signed)
On replacement.  Follow tsh.  

## 2013-06-12 NOTE — Progress Notes (Signed)
Subjective:    Patient ID: Theresa Blake, female    DOB: October 27, 1940, 72 y.o.   MRN: [id number]  Urinary Tract Infection   72 year old female with past history of lumbar spinal stenosis, hypothyroidism, diabetes mellitus, hypercholesterolemia, panic disorder, depression and hypertension who comes in today as a work in with concerns regarding a urinary tract infection.  She is followed by a therapist Misty Stanley Poulous ([facility] in [place]) - who prescribes her psych meds.  Has a history of depression.  Sees her every 3-6 months.  She lives with her son.  Does not drive.  She reports that starting four days ago, she noticed increased urinary frequency and hematuria.  Some lower abdominal pressure since this started.  Called concerned regarding a urinary tract infection.  She also reports having back pain and pain in her legs/fet.  The numbness in her feel - better.  She has been evaluated extensively.  Had MRI which revealed spinal stenosis.  Saw Dr Yevette Edwards.  States not a surgical candidate.  She states she was referred to Sports Medicine.  She is using her walker now to get around and does not do much walking.  We set her up with physical therapy.  She does feel this helped some.  She feels she needs to do something more for the pain and her back.  She feels breathing is stable.  No chest pain.  No reported bowel issues today.  She does report she is trying to do better regarding her diet and her sugars.  Continues to lose weight.  States it is coming off gradually.     Past Medical History  Diagnosis Date  . Depression     mood disorder; ? bipolar  . Diabetes mellitus type II   . HLD (hyperlipidemia)   . HTN (hypertension)   . Hx pulmonary embolism     multiple  . Morbid obesity   . Allergic rhinitis, cause unspecified   . Backache, unspecified   . Malignant neoplasm of corpus uteri, except isthmus   . Edema   . Other specified disease of hair and hair follicles   . Dermatomycosis, unspecified    . Pain in joint, pelvic region and thigh   . Panic disorder without agoraphobia   . Foot fracture, right   . Abnormal Pap smear of cervix   . Lumbar spinal stenosis     Outpatient Encounter Prescriptions as of 06/11/2013  Medication Sig Dispense Refill  . B Complex-C (B-COMPLEX WITH VITAMIN C) tablet Take 1 tablet by mouth daily.        . Blood Glucose Monitoring Suppl (ACCU-CHEK AVIVA PLUS) W/DEVICE KIT by Does not apply route.      Marland Kitchen buPROPion (WELLBUTRIN XL) 150 MG 24 hr tablet Take 450 mg by mouth every morning.        . cetirizine (ZYRTEC) 10 MG tablet Take 10 mg by mouth daily.        . cholecalciferol (VITAMIN D) 1000 UNITS tablet Take 1,000 Units by mouth daily.        . clonazePAM (KLONOPIN) 1 MG tablet Take 1 mg by mouth 3 (three) times daily as needed. For anxiety      . fish oil-omega-3 fatty acids 1000 MG capsule Take 1 g by mouth daily.        . fluticasone (FLONASE) 50 MCG/ACT nasal spray PLACE 2 SPRAYS INTO THE NOSE DAILY.  16 g  2  . gabapentin (NEURONTIN) 100 MG capsule Take 100  mg by mouth 3 (three) times daily.      Marland Kitchen glucose blood (ACCU-CHEK AVIVA PLUS) test strip 1 each by Other route as needed for other. Use as instructed      . hydrochlorothiazide (HYDRODIURIL) 25 MG tablet TAKE 1 TABLET BY MOUTH EVERY DAY  90 tablet  1  . levothyroxine (SYNTHROID, LEVOTHROID) 50 MCG tablet TAKE 1 TABLET BY MOUTH EVERY DAY  90 tablet  0  . lisinopril (PRINIVIL,ZESTRIL) 20 MG tablet TAKE 1 TABLET (20 MG TOTAL) BY MOUTH DAILY.  90 tablet  1  . Multiple Vitamin (MULTIVITAMIN) tablet Take 1 tablet by mouth daily.        . naproxen sodium (ANAPROX) 220 MG tablet Take 220 mg by mouth 2 (two) times daily with a meal.      . NASAL SPRAY SALINE NA by Nasal route as directed.        Marland Kitchen omeprazole (PRILOSEC) 20 MG capsule TAKE 1 CAPSULE EVERY DAY  90 capsule  2  . venlafaxine (EFFEXOR-XR) 150 MG 24 hr capsule Take 300 mg by mouth daily.        Marland Kitchen VYTORIN 10-20 MG per tablet TAKE 1 TABLET EVERY  DAY  90 tablet  2  . [DISCONTINUED] Butenafine HCl (MENTAX) 1 % cream Apply topically daily.        . [DISCONTINUED] nystatin (MYCOSTATIN) cream APPLY TO AFFECTED AREAS ONCE DAILY AS NEEDED RASH  15 g  1  . ciprofloxacin (CIPRO) 500 MG tablet Take 1 tablet (500 mg total) by mouth 2 (two) times daily.  10 tablet  0   No facility-administered encounter medications on file as of 06/11/2013.    Review of Systems Patient denies any headache, lightheadedness or dizziness.  No significant sinus or allergy symptoms reported.  No chest pain, tightness or palpitations.  No increased shortness of breath, cough or congestion reported.  Breathing stable.  No nausea or vomiting.  No acid reflux. No significant abdominal pain or cramping.  Some lower abdominal pressure since the urinary symptoms started.  No bowel change, such as diarrhea, constipation, BRBPR or melana.  Urinary symptoms as outlined.  Back pain.  Limited mobility.  Using her walker.  Home physical therapy helped some .  Feels she needs something more.       Objective:   Physical Exam  Filed Vitals:   06/11/13 1522  BP: 118/70  Pulse: 92  Temp: 98.9 F (87.68 C)   72 year old female in no acute distress.  NECK:  Supple.  Nontender.  No audible bruit.  HEART:  Appears to be regular. LUNGS:  No crackles or wheezing audible.  Respirations even and unlabored.  RADIAL PULSE:  Equal bilaterally.  ABDOMEN:  Soft, nontender.  Bowel sounds present and normal.  No audible abdominal bruit.   EXTREMITIES:  No increased edema present.  Stable.           Assessment & Plan:  UTI.  Symptoms as outlined.  No vaginal symptoms or bleeding.  Urine dip c/w uti.  Will treat with cipro as directed.  Follow.  Urine culture sent.   HEALTH MAINTENANCE.  Physical last visit.  Mammogram 04/05/13 - Birads II.  Never has had colonoscopy.  Needs.   I spent over 25 minutes with this pt and more than 50% of the time was spent in consultation regarding the above.

## 2013-06-14 ENCOUNTER — Encounter: Payer: Self-pay | Admitting: Internal Medicine

## 2013-06-14 LAB — URINE CULTURE: Colony Count: >=100000

## 2013-06-15 ENCOUNTER — Encounter: Payer: Self-pay | Admitting: Internal Medicine

## 2013-06-15 ENCOUNTER — Encounter: Payer: Self-pay | Admitting: *Deleted

## 2013-06-15 NOTE — Telephone Encounter (Signed)
Chart updated

## 2013-06-18 ENCOUNTER — Telehealth: Payer: Self-pay | Admitting: Internal Medicine

## 2013-06-18 MED ORDER — FLUTICASONE PROPIONATE 50 MCG/ACT NA SUSP: 2.0000 | Freq: Every day | NASAL | Status: DC

## 2013-06-18 NOTE — Telephone Encounter (Signed)
Refilled flonase #69months with 3 refills

## 2013-07-09 ENCOUNTER — Other Ambulatory Visit: Payer: Self-pay | Admitting: Family Medicine

## 2013-07-09 NOTE — Telephone Encounter (Signed)
Rout to PCP 

## 2013-07-09 NOTE — Telephone Encounter (Signed)
Refilled thyroid medication #90 with one refill.

## 2013-07-10 ENCOUNTER — Other Ambulatory Visit: Payer: Medicare Other

## 2013-07-13 ENCOUNTER — Ambulatory Visit: Payer: Medicare Other | Admitting: Internal Medicine

## 2013-08-30 ENCOUNTER — Other Ambulatory Visit: Payer: Self-pay

## 2013-09-25 ENCOUNTER — Ambulatory Visit: Payer: Medicare Other | Admitting: Adult Health

## 2013-10-09 ENCOUNTER — Other Ambulatory Visit: Payer: Self-pay | Admitting: Internal Medicine

## 2013-11-20 ENCOUNTER — Other Ambulatory Visit: Payer: Medicare Other

## 2013-11-22 ENCOUNTER — Other Ambulatory Visit: Payer: Self-pay | Admitting: Internal Medicine

## 2013-11-26 ENCOUNTER — Ambulatory Visit: Payer: Medicare Other | Admitting: Internal Medicine

## 2013-12-13 ENCOUNTER — Telehealth: Payer: Self-pay | Admitting: Internal Medicine

## 2013-12-13 NOTE — Telephone Encounter (Signed)
The patient has been scheduled with Raquel on 2.23.15

## 2013-12-13 NOTE — Telephone Encounter (Signed)
The patient wants to see Dr. Nicki Reaper for her right knee pain. I don't have any available appointment.

## 2013-12-13 NOTE — Telephone Encounter (Signed)
I will be out of the office for the next several days.  If increased knee pain - can schedule with Raquel while I am out and then I can f/u after.  I think she missed her last appt with me.

## 2013-12-17 ENCOUNTER — Ambulatory Visit: Payer: Medicare Other | Admitting: Adult Health

## 2014-01-11 ENCOUNTER — Other Ambulatory Visit: Payer: Self-pay | Admitting: Internal Medicine

## 2014-01-11 NOTE — Telephone Encounter (Signed)
Sent my chart message, requesting to schedule appt

## 2014-01-14 ENCOUNTER — Other Ambulatory Visit: Payer: Self-pay | Admitting: Internal Medicine

## 2014-01-21 ENCOUNTER — Telehealth: Payer: Self-pay | Admitting: Internal Medicine

## 2014-01-21 DIAGNOSIS — N950 Postmenopausal bleeding: Secondary | ICD-10-CM

## 2014-01-21 NOTE — Telephone Encounter (Signed)
Patient Information:  Caller Name: Dandra  Phone: [phone]  Patient: Theresa Blake, Theresa Blake  Gender: Female  DOB: [birth year]  Age: 73 Years  PCP: Einar Pheasant  Office Follow Up:  Does the office need to follow up with this patient?: Yes  Instructions For The Office: Please follow up with patient with further instructions - See nurses notes for details.  RN Note:  Patient states that she has light pinkness when going to the bathroom, not sexually active. Had had to be referred to Gynecologist in past for polyps and also has had D&C x2 in the past however states that it has been several years ago.  Recommendation is for patient to be seen in 2 weeks.  Please follow up with patient with appointment, referral or recommendations.  Symptoms  Reason For Call & Symptoms: Bleeding from vaginal area, light pink with wiping  Reviewed Health History In EMR: Yes  Reviewed Medications In EMR: Yes  Reviewed Allergies In EMR: Yes  Reviewed Surgeries / Procedures: Yes  Date of Onset of Symptoms: 01/19/2014  Guideline(s) Used:  Vaginal Bleeding - Abnormal  Disposition Per Guideline:   See Within 2 Weeks in Office  Reason For Disposition Reached:   Age > 39 years with irregular or excessive bleeding  Advice Given:  Call Back If:  Bleeding becomes worse  You become worse.  Patient Will Follow Care Advice:  YES

## 2014-01-21 NOTE — Telephone Encounter (Signed)
States the vaginal bleeding started on Friday or Saturday, just a light pink with wiping, has occurred daily since. History of endometrial polyps, has seen Dr. Kennon Rounds previously, ok to return to her for evaluation. Denies any urinary symptoms.

## 2014-01-21 NOTE — Telephone Encounter (Signed)
Order placed for gyn referral.  See phone note.

## 2014-01-21 NOTE — Telephone Encounter (Signed)
Pt left vm.  States she is having a "bleeding problem".  Asking what she needs to do.  No appt available at this office today.  Returned pt call, no answer/no machine. Please advise.

## 2014-01-31 ENCOUNTER — Telehealth: Payer: Self-pay | Admitting: Internal Medicine

## 2014-01-31 NOTE — Telephone Encounter (Signed)
FYI to Dr. Nicki Reaper

## 2014-01-31 NOTE — Telephone Encounter (Signed)
Left message to return call 

## 2014-01-31 NOTE — Telephone Encounter (Signed)
The patient called yelling that she could not get to her appointment at [facility] ,because her car was stuck in a hole and she did not understand why Dr. Nicki Reaper would not see her and that she was bleeding .  I asked the patient if she wanted to be rescheduled at [facility] and she continued yelling and stated that she will go to the ER and that she was bleeding and nobody gives a Sh  and hung up the phone.

## 2014-02-01 ENCOUNTER — Encounter: Payer: Self-pay | Admitting: Internal Medicine

## 2014-02-01 NOTE — Telephone Encounter (Signed)
I called pt regarding her phone call.  She informed me of concerns that she had regarding trying to get some information from [facility].  This did not involve our office.  I explained to her that the call was inappropriate and did not appreciate the tone or language used in the phone call.  I addressed her statement about me not seeing her.  I explained to her (as had been discussed with her when the appt was made with [facility]) that she was having postmenopausal bleeding and needed a gyn evaluation.  She had seen gyn in the past and had endometrial polyps that had to be addressed.  She was in agreement with the appointment when it was made.  She then stated that she did not like the fact that her bleeding got worse and that I would not see her.  I explained to her that she never called our office back to let us know that the bleeding was worse and that she would rather be seen here.  She then stated that the reason she did not call back was because she never gets and appointment when she calls.  I then informed her that the only recent call we had of her requesting an appt was in 2/15.  She called on a Friday and got an appointment for Monday.  It appeared she did not show for that appt.  I also informed her that she has failed to show for a couple of her appts with me.  Her tone was inappropriate throughout this conversation.  I explained to her that I was discharging her from my practice and that she would be receiving a letter from me.  I also stressed to her the importance of following up on her bleeding and offered to reschedule her appt.   She stated that she had already rescheduled the appt to f/u with gyn.

## 2014-02-01 NOTE — Telephone Encounter (Signed)
Please see me about this pt.

## 2014-02-05 ENCOUNTER — Telehealth: Payer: Self-pay | Admitting: Internal Medicine

## 2014-02-05 NOTE — Telephone Encounter (Addendum)
Patient dismissed from [facility] by Einar Pheasant MD , effective February 01, 2014. Dismissal letter sent out by certified / registered mail. DAJ  Received signed domestic return receipt verifying delivery of certified letter on February 08, 2014. Article number [id number] [place]

## 2014-02-12 ENCOUNTER — Ambulatory Visit: Payer: Medicare Other | Admitting: Internal Medicine

## 2014-02-14 NOTE — Telephone Encounter (Signed)
I spoke with Dr. Nicki Reaper in regards to patient and we have sent her a dismissal letter.

## 2014-02-28 ENCOUNTER — Other Ambulatory Visit: Payer: Self-pay | Admitting: Internal Medicine

## 2014-03-11 ENCOUNTER — Other Ambulatory Visit: Payer: Self-pay | Admitting: Internal Medicine

## 2014-03-12 ENCOUNTER — Ambulatory Visit: Payer: Self-pay | Admitting: Gynecologic Oncology

## 2014-03-14 ENCOUNTER — Ambulatory Visit: Payer: Medicare Other | Admitting: Cardiovascular Disease

## 2014-03-21 ENCOUNTER — Encounter: Payer: Self-pay | Admitting: Cardiovascular Disease

## 2014-03-21 ENCOUNTER — Ambulatory Visit (INDEPENDENT_AMBULATORY_CARE_PROVIDER_SITE_OTHER): Payer: Medicare Other | Admitting: Cardiovascular Disease

## 2014-03-21 VITALS — BP 142/88 | HR 92 | Ht 66.0 in | Wt 346.2 lb

## 2014-03-21 DIAGNOSIS — Z01810 Encounter for preprocedural cardiovascular examination: Secondary | ICD-10-CM

## 2014-03-21 DIAGNOSIS — I10 Essential (primary) hypertension: Secondary | ICD-10-CM

## 2014-03-21 NOTE — Patient Instructions (Signed)
I will send a letter to Dr. Sabra Heck  Follow up a needed.

## 2014-03-22 ENCOUNTER — Ambulatory Visit: Payer: Medicare Other | Admitting: Internal Medicine

## 2014-03-23 ENCOUNTER — Encounter: Payer: Self-pay | Admitting: Cardiovascular Disease

## 2014-03-23 DIAGNOSIS — Z7189 Other specified counseling: Secondary | ICD-10-CM | POA: Insufficient documentation

## 2014-03-23 NOTE — Progress Notes (Signed)
Primary care physician: Dr. Jacqualine Code Referring physician: Dr. Jacquelyne Balint  HPI  This is a 73 year old female who was referred for preoperative cardiovascular evaluation. She was found to have endometrial cancer recently and needs to have surgery done. She has no previous cardiac history. There is no history of ischemic heart disease, congestive heart failure or stroke. She has multiple chronic medical conditions that include type 2 diabetes, hypertension, hyperlipidemia, morbid obesity and osteoarthritis. She walks slowly with a walker and uses a wheelchair   frequently. She denies any chest discomfort. She does have chronic exertional dyspnea without orthopnea or PND. She had a recent echocardiogram done at [facility] which showed normal LV systolic function with moderate left ventricular hypertrophy and mild diastolic dysfunction. No significant valvular abnormalities.   Allergies  Allergen Reactions  . Lamotrigine     REACTION: itch, allergy     Current Outpatient Prescriptions on File Prior to Visit  Medication Sig Dispense Refill  . B Complex-C (B-COMPLEX WITH VITAMIN C) tablet Take 1 tablet by mouth daily.        . Blood Glucose Monitoring Suppl (ACCU-CHEK AVIVA PLUS) W/DEVICE KIT by Does not apply route.      Marland Kitchen buPROPion (WELLBUTRIN XL) 150 MG 24 hr tablet Take 450 mg by mouth every morning.        . cetirizine (ZYRTEC) 10 MG tablet Take 10 mg by mouth daily.        . cholecalciferol (VITAMIN D) 1000 UNITS tablet Take 1,000 Units by mouth daily.        . clonazePAM (KLONOPIN) 1 MG tablet Take 1 mg by mouth 3 (three) times daily as needed. For anxiety      . fish oil-omega-3 fatty acids 1000 MG capsule Take 1 g by mouth daily.        . fluticasone (FLONASE) 50 MCG/ACT nasal spray Place 2 sprays into the nose daily.  48 g  3  . glucose blood (ACCU-CHEK AVIVA PLUS) test strip 1 each by Other route as needed for other. Use as instructed      . hydrochlorothiazide (HYDRODIURIL) 25 MG  tablet TAKE 1 TABLET BY MOUTH EVERY DAY  90 tablet  1  . levothyroxine (SYNTHROID, LEVOTHROID) 50 MCG tablet Take 1 tablet (50 mcg total) by mouth daily before breakfast.  30 tablet  0  . lisinopril (PRINIVIL,ZESTRIL) 20 MG tablet TAKE 1 TABLET BY MOUTH EVERY DAY  90 tablet  1  . Multiple Vitamin (MULTIVITAMIN) tablet Take 1 tablet by mouth daily.        . naproxen sodium (ANAPROX) 220 MG tablet Take 220 mg by mouth 2 (two) times daily with a meal.      . NASAL SPRAY SALINE NA by Nasal route as directed.        Marland Kitchen omeprazole (PRILOSEC) 20 MG capsule TAKE ONE CAPSULE BY MOUTH EVERY DAY *NEEDS OFFICE VISIT*  30 capsule  0  . venlafaxine (EFFEXOR-XR) 150 MG 24 hr capsule Take 300 mg by mouth daily.        Marland Kitchen VYTORIN 10-20 MG per tablet TAKE 1 TABLET EVERY DAY  90 tablet  2   No current facility-administered medications on file prior to visit.     Past Medical History  Diagnosis Date  . Depression     mood disorder; ? bipolar  . Diabetes mellitus type II   . HLD (hyperlipidemia)   . HTN (hypertension)   . Hx pulmonary embolism     multiple  . Morbid  obesity   . Allergic rhinitis, cause unspecified   . Backache, unspecified   . Edema   . Other specified disease of hair and hair follicles   . Dermatomycosis, unspecified   . Pain in joint, pelvic region and thigh   . Panic disorder without agoraphobia   . Foot fracture, right   . Abnormal Pap smear of cervix   . Lumbar spinal stenosis   . Malignant neoplasm of corpus uteri, except isthmus      Past Surgical History  Procedure Laterality Date  . Tubal ligation    . Tonsillectomy    . Breast biopsy      Right-benign  . Endometrial biopsy  08/2006    attempted     Family History  Problem Relation Age of Onset  . Heart attack Father 4  . Pancreatic cancer Mother     with mets  . Hyperlipidemia Son   . Hyperlipidemia Son   . Gout Son      History   Social History  . Marital Status: Widowed    Spouse Name: N/A     Number of Children: 4  . Years of Education: N/A   Occupational History  . Disabled    Social History Main Topics  . Smoking status: Never Smoker   . Smokeless tobacco: Never Used  . Alcohol Use: No  . Drug Use: No  . Sexual Activity: Not on file   Other Topics Concern  . Not on file   Social History Narrative   Widow-husband died with MI, DM      4 sons      Disability      Compulsive overeater              ROS A 10 point review of system was performed. It is negative other than that mentioned in the history of present illness.   PHYSICAL EXAM   BP 142/88  Pulse 92  Ht _0  (1.676 m)  Wt 346 lb 4 oz (157.058 kg)  BMI 55.91 kg/m2 Constitutional: She is oriented to person, place, and time. She appears well-developed and well-nourished. No distress.  HENT: No nasal discharge.  Head: Normocephalic and atraumatic.  Eyes: Pupils are equal and round. No discharge.  Neck: Normal range of motion. Neck supple. No JVD present. No thyromegaly present.  Cardiovascular: Normal rate, regular rhythm, normal heart sounds. Exam reveals no gallop and no friction rub. No murmur heard.  Pulmonary/Chest: Effort normal and breath sounds normal. No stridor. No respiratory distress. She has no wheezes. She has no rales. She exhibits no tenderness.  Abdominal: Soft. Bowel sounds are normal. She exhibits no distension. There is no tenderness. There is no rebound and no guarding.  Musculoskeletal: Normal range of motion. She exhibits no edema and no tenderness.  Neurological: She is alert and oriented to person, place, and time. Coordination normal.  Skin: Skin is warm and dry. No rash noted. She is not diaphoretic. No erythema. No pallor.  Psychiatric: She has a normal mood and affect. Her behavior is normal. Judgment and thought content normal.     EKG: normal sinus rhythm with left atrial enlargement, incomplete right bundle branch block and nonspecific T wave changes with mildly  prolonged QT interval.   ASSESSMENT AND PLAN

## 2014-03-23 NOTE — Assessment & Plan Note (Addendum)
The patient has no previous cardiac history and has no symptoms suggestive of angina. She does have poor functional capacity. EKG shows mild abnormalities but no evidence of ischemia. Recent echocardiogram showed normal LV systolic function. Given her multiple comorbidities, she is considered at moderate risk from a cardiac standpoint.  I do not recommend stress testing before surgery.  DVT prophylaxis prophylaxis is recommended around the surgery given that she is at high risk.

## 2014-03-23 NOTE — Assessment & Plan Note (Signed)
Blood pressure is reasonably controlled on current medications. I made no changes today.

## 2014-03-25 ENCOUNTER — Ambulatory Visit: Payer: Self-pay | Admitting: Gynecologic Oncology

## 2014-04-11 ENCOUNTER — Other Ambulatory Visit (HOSPITAL_COMMUNITY): Payer: Self-pay | Admitting: Psychiatry

## 2014-04-12 ENCOUNTER — Other Ambulatory Visit: Payer: Self-pay | Admitting: Internal Medicine

## 2014-04-16 ENCOUNTER — Ambulatory Visit: Payer: Self-pay | Admitting: Internal Medicine

## 2014-04-17 ENCOUNTER — Other Ambulatory Visit (HOSPITAL_COMMUNITY): Payer: Self-pay | Admitting: Psychiatry

## 2014-04-24 ENCOUNTER — Ambulatory Visit: Payer: Self-pay | Admitting: Gynecologic Oncology

## 2014-04-26 ENCOUNTER — Other Ambulatory Visit: Payer: Self-pay | Admitting: Internal Medicine

## 2014-06-27 ENCOUNTER — Other Ambulatory Visit: Payer: Self-pay | Admitting: Internal Medicine

## 2015-01-25 ENCOUNTER — Encounter (HOSPITAL_COMMUNITY): Payer: Self-pay | Admitting: Emergency Medicine

## 2015-01-25 ENCOUNTER — Emergency Department (INDEPENDENT_AMBULATORY_CARE_PROVIDER_SITE_OTHER)
Admission: EM | Admit: 2015-01-25 | Discharge: 2015-01-25 | Disposition: A | Discharge status: Home / Self Care | Payer: Medicare Other | Source: Home / Self Care | Attending: Family Medicine | Admitting: Family Medicine

## 2015-01-25 DIAGNOSIS — B372 Candidiasis of skin and nail: Secondary | ICD-10-CM | POA: Diagnosis not present

## 2015-01-25 MED ORDER — NYSTATIN 100000 UNIT/GM EX POWD
CUTANEOUS | Status: DC
Start: 2015-01-25 — End: 2017-06-19

## 2015-01-25 NOTE — ED Provider Notes (Signed)
CSN: [id number]     Arrival date & time 01/25/15  1406 History   First MD Initiated Contact with Patient 01/25/15 1508     Chief Complaint  Patient presents with  . Skin Problem   (Consider location/radiation/quality/duration/timing/severity/associated sxs/prior Treatment) HPI Comments: Morbidly obese 74 year old female states she was trying to get into a car 1 week ago and scraped her left lower abdomen on the seat of the chair. Since that time she has noticed stinging and burning pain.   Past Medical History  Diagnosis Date  . Depression     mood disorder; ? bipolar  . Diabetes mellitus type II   . HLD (hyperlipidemia)   . HTN (hypertension)   . Hx pulmonary embolism     multiple  . Morbid obesity   . Allergic rhinitis, cause unspecified   . Backache, unspecified   . Edema   . Other specified disease of hair and hair follicles   . Dermatomycosis, unspecified   . Pain in joint, pelvic region and thigh   . Panic disorder without agoraphobia   . Foot fracture, right   . Abnormal Pap smear of cervix   . Lumbar spinal stenosis   . Malignant neoplasm of corpus uteri, except isthmus    Past Surgical History  Procedure Laterality Date  . Tubal ligation    . Tonsillectomy    . Breast biopsy      Right-benign  . Endometrial biopsy  08/2006    attempted   Family History  Problem Relation Age of Onset  . Heart attack Father 52  . Pancreatic cancer Mother     with mets  . Hyperlipidemia Son   . Hyperlipidemia Son   . Gout Son    History  Substance Use Topics  . Smoking status: Never Smoker   . Smokeless tobacco: Never Used  . Alcohol Use: No   OB History    No data available     Review of Systems  Constitutional: Negative for activity change.  HENT: Negative.   Respiratory: Negative.   Gastrointestinal: Negative.   Genitourinary: Negative for dysuria, frequency, flank pain and pelvic pain.  Skin: Positive for rash.    Allergies  Lamotrigine  Home  Medications   Prior to Admission medications   Medication Sig Start Date End Date Taking? Authorizing Provider  B Complex-C (B-COMPLEX WITH VITAMIN C) tablet Take 1 tablet by mouth daily.      Historical Provider, MD  Blood Glucose Monitoring Suppl (ACCU-CHEK AVIVA PLUS) W/DEVICE KIT by Does not apply route.    Historical Provider, MD  buPROPion (WELLBUTRIN XL) 150 MG 24 hr tablet Take 450 mg by mouth every morning.      Historical Provider, MD  cetirizine (ZYRTEC) 10 MG tablet Take 10 mg by mouth daily.      Historical Provider, MD  cholecalciferol (VITAMIN D) 1000 UNITS tablet Take 1,000 Units by mouth daily.      Historical Provider, MD  clonazePAM (KLONOPIN) 1 MG tablet Take 1 mg by mouth 3 (three) times daily as needed. For anxiety    Historical Provider, MD  fish oil-omega-3 fatty acids 1000 MG capsule Take 1 g by mouth daily.      Historical Provider, MD  fluticasone (FLONASE) 50 MCG/ACT nasal spray Place 2 sprays into the nose daily. 06/18/13   Einar Pheasant, MD  glucose blood (ACCU-CHEK AVIVA PLUS) test strip 1 each by Other route as needed for other. Use as instructed    Historical Provider, MD  hydrochlorothiazide (HYDRODIURIL) 25 MG tablet TAKE 1 TABLET BY MOUTH EVERY DAY 11/22/13   Einar Pheasant, MD  levothyroxine (SYNTHROID, LEVOTHROID) 50 MCG tablet Take 1 tablet (50 mcg total) by mouth daily before breakfast. 02/28/14   Einar Pheasant, MD  lisinopril (PRINIVIL,ZESTRIL) 20 MG tablet TAKE 1 TABLET BY MOUTH EVERY DAY 04/12/14   Einar Pheasant, MD  Multiple Vitamin (MULTIVITAMIN) tablet Take 1 tablet by mouth daily.      Historical Provider, MD  naproxen sodium (ANAPROX) 220 MG tablet Take 220 mg by mouth 2 (two) times daily with a meal.    Historical Provider, MD  NASAL SPRAY SALINE NA by Nasal route as directed.      Historical Provider, MD  nystatin (MYCOSTATIN/NYSTOP) 100000 UNIT/GM POWD Sprinkle powder to the affected rash daily 01/25/15   Janne Napoleon, NP  omeprazole (PRILOSEC) 20 MG  capsule TAKE ONE CAPSULE BY MOUTH EVERY DAY *NEEDS OFFICE VISIT*    Einar Pheasant, MD  venlafaxine (EFFEXOR-XR) 150 MG 24 hr capsule Take 300 mg by mouth daily.      Historical Provider, MD  VYTORIN 10-20 MG per tablet TAKE 1 TABLET EVERY DAY 03/20/13   Einar Pheasant, MD   BP 156/84 mmHg  Pulse 103  Temp(Src) 98.6 F (37 C) (Oral)  Resp 20  SpO2 95% Physical Exam  Constitutional: She is oriented to person, place, and time. She appears well-developed and well-nourished. No distress.  Neck: Normal range of motion. Neck supple.  Cardiovascular: Normal rate and regular rhythm.   Pulmonary/Chest: Effort normal and breath sounds normal.  Abdominal: Soft. There is no tenderness.  Neurological: She is alert and oriented to person, place, and time. She exhibits normal muscle tone.  Skin: Skin is warm and dry.  There is a well marginated area of cutaneous erythema along the inguinal fold where the lower fatty abdomen covers this area. There is no abdominal tenderness. No ecchymosis.  Nursing note and vitals reviewed.   ED Course  Procedures (including critical care time) Labs Review Labs Reviewed - No data to display  Imaging Review No results found.   MDM   1. Candidal intertrigo    Apply Lamisil or Lotrimin AF  to the rash twice a day. Use the Nystatin powder daily Keep the area dry     Janne Napoleon, NP 01/25/15 1535

## 2015-01-25 NOTE — ED Notes (Signed)
Pt states that she has a skin issue under her lower stomach its red with a lot a moisture.

## 2015-01-25 NOTE — Discharge Instructions (Signed)
Cutaneous Candidiasis Apply Lamisil or Lotrimin AF  to the rash twice a day. Use the Nystatin powder daily Keep the area dry Cutaneous candidiasis is a condition in which there is an overgrowth of yeast (candida) on the skin. Yeast normally live on the skin, but in small enough numbers not to cause any symptoms. In certain cases, increased growth of the yeast may cause an actual yeast infection. This kind of infection usually occurs in areas of the skin that are constantly warm and moist, such as the armpits or the groin. Yeast is the most common cause of diaper rash in babies and in people who control their bowel movements (incontinence). CAUSES  The fungus that most often causes cutaneous candidiasis is Candida albicans. Conditions that can increase the risk of getting a yeast infection of the skin include:  Obesity.  Pregnancy.  Diabetes.  Taking antibiotic medicine.  Taking birth control pills.  Taking steroid medicines.  Thyroid disease.  An iron or zinc deficiency.  Problems with the immune system. SYMPTOMS   Red, swollen area of the skin.  Bumps on the skin.  Itchiness. DIAGNOSIS  The diagnosis of cutaneous candidiasis is usually based on its appearance. Light scrapings of the skin may also be taken and viewed under a microscope to identify the presence of yeast. TREATMENT  Antifungal creams may be applied to the infected skin. In severe cases, oral medicines may be needed.  HOME CARE INSTRUCTIONS   Keep your skin clean and dry.  Maintain a healthy weight.  If you have diabetes, keep your blood sugar under control. SEEK IMMEDIATE MEDICAL CARE IF:  Your rash continues to spread despite treatment.  You have a fever, chills, or abdominal pain. Document Released: 06/29/2011 Document Revised: 01/03/2012 Document Reviewed: 06/29/2011 [facility] Patient Information 2015 [place], [place]. This information is not intended to replace advice given to you by your health care  provider. Make sure you discuss any questions you have with your health care provider.

## 2015-04-26 ENCOUNTER — Encounter (HOSPITAL_COMMUNITY): Payer: Self-pay | Admitting: Emergency Medicine

## 2015-04-26 ENCOUNTER — Emergency Department (INDEPENDENT_AMBULATORY_CARE_PROVIDER_SITE_OTHER)
Admission: EM | Admit: 2015-04-26 | Discharge: 2015-04-26 | Disposition: A | Discharge status: Home / Self Care | Payer: Medicare Other | Source: Home / Self Care | Attending: Emergency Medicine | Admitting: Emergency Medicine

## 2015-04-26 ENCOUNTER — Emergency Department (INDEPENDENT_AMBULATORY_CARE_PROVIDER_SITE_OTHER): Payer: Medicare Other

## 2015-04-26 DIAGNOSIS — S60222A Contusion of left hand, initial encounter: Secondary | ICD-10-CM

## 2015-04-26 DIAGNOSIS — W06XXXA Fall from bed, initial encounter: Secondary | ICD-10-CM

## 2015-04-26 DIAGNOSIS — S61219A Laceration without foreign body of unspecified finger without damage to nail, initial encounter: Secondary | ICD-10-CM

## 2015-04-26 MED ORDER — BACITRACIN 500 UNIT/GM EX OINT
1.0000 "application " | TOPICAL_OINTMENT | Freq: Once | CUTANEOUS | Status: AC
  Administered 2015-04-26: 1 via TOPICAL

## 2015-04-26 MED ORDER — CEPHALEXIN 500 MG PO CAPS: 500.0000 mg | ORAL_CAPSULE | Freq: Three times a day (TID) | ORAL | Status: DC

## 2015-04-26 NOTE — ED Notes (Signed)
Laceration to left little finger.  Bleeding controlled.  Injury occurred around 3:00 am this morning, fell from bed and finger pinched on something in room

## 2015-04-26 NOTE — ED Provider Notes (Signed)
CSN: [id number]     Arrival date & time 04/26/15  1340 History   First MD Initiated Contact with Patient 04/26/15 1617     Chief Complaint  Patient presents with  . Laceration   (Consider location/radiation/quality/duration/timing/severity/associated sxs/prior Treatment) HPI Comments: 74 year old severely and morbidly obese female fell out of bed approximately 36 hours ago. During the fall she accidentally got her right finger caught in between parts of a tray and suffered a laceration to the proximal phalanx. She states that she called EMS but for reasons uncertain she did not seek medical attention until today. She also has some mild bruising to the left side of the neck. Due to her past medical history of thrombotic disease and pulmonary embolism she is currently self administering Lovenox twice a day. She denies injuring her head. Denies shortness of breath or chest pain. Last T dap was in 2013.   Past Medical History  Diagnosis Date  . Depression     mood disorder; ? bipolar  . Diabetes mellitus type II   . HLD (hyperlipidemia)   . HTN (hypertension)   . Hx pulmonary embolism     multiple  . Morbid obesity   . Allergic rhinitis, cause unspecified   . Backache, unspecified   . Edema   . Other specified disease of hair and hair follicles   . Dermatomycosis, unspecified   . Pain in joint, pelvic region and thigh   . Panic disorder without agoraphobia   . Foot fracture, right   . Abnormal Pap smear of cervix   . Lumbar spinal stenosis   . Malignant neoplasm of corpus uteri, except isthmus    Past Surgical History  Procedure Laterality Date  . Tubal ligation    . Tonsillectomy    . Breast biopsy      Right-benign  . Endometrial biopsy  08/2006    attempted   Family History  Problem Relation Age of Onset  . Heart attack Father 65  . Pancreatic cancer Mother     with mets  . Hyperlipidemia Son   . Hyperlipidemia Son   . Gout Son    History  Substance Use Topics  .  Smoking status: Never Smoker   . Smokeless tobacco: Never Used  . Alcohol Use: No   OB History    No data available     Review of Systems  Constitutional: Negative for fever and fatigue.  HENT: Negative for congestion, dental problem, ear discharge, facial swelling, sore throat, trouble swallowing and voice change.   Eyes: Negative.   Respiratory: Negative for cough and shortness of breath.   Cardiovascular: Negative for chest pain.  Gastrointestinal: Negative.   Genitourinary: Negative.   Musculoskeletal: Negative for back pain.  Skin: Positive for wound.  Neurological: Negative for dizziness, syncope, facial asymmetry and speech difficulty.    Allergies  Lamotrigine  Home Medications   Prior to Admission medications   Medication Sig Start Date End Date Taking? Authorizing Provider  B Complex-C (B-COMPLEX WITH VITAMIN C) tablet Take 1 tablet by mouth daily.      Historical Provider, MD  Blood Glucose Monitoring Suppl (ACCU-CHEK AVIVA PLUS) W/DEVICE KIT by Does not apply route.    Historical Provider, MD  buPROPion (WELLBUTRIN XL) 150 MG 24 hr tablet Take 450 mg by mouth every morning.      Historical Provider, MD  cephALEXin (KEFLEX) 500 MG capsule Take 1 capsule (500 mg total) by mouth 3 (three) times daily. 04/26/15   Janne Napoleon,  NP  cetirizine (ZYRTEC) 10 MG tablet Take 10 mg by mouth daily.      Historical Provider, MD  cholecalciferol (VITAMIN D) 1000 UNITS tablet Take 1,000 Units by mouth daily.      Historical Provider, MD  clonazePAM (KLONOPIN) 1 MG tablet Take 1 mg by mouth 3 (three) times daily as needed. For anxiety    Historical Provider, MD  fish oil-omega-3 fatty acids 1000 MG capsule Take 1 g by mouth daily.      Historical Provider, MD  fluticasone (FLONASE) 50 MCG/ACT nasal spray Place 2 sprays into the nose daily. 06/18/13   Einar Pheasant, MD  glucose blood (ACCU-CHEK AVIVA PLUS) test strip 1 each by Other route as needed for other. Use as instructed     Historical Provider, MD  hydrochlorothiazide (HYDRODIURIL) 25 MG tablet TAKE 1 TABLET BY MOUTH EVERY DAY 11/22/13   Einar Pheasant, MD  levothyroxine (SYNTHROID, LEVOTHROID) 50 MCG tablet Take 1 tablet (50 mcg total) by mouth daily before breakfast. 02/28/14   Einar Pheasant, MD  lisinopril (PRINIVIL,ZESTRIL) 20 MG tablet TAKE 1 TABLET BY MOUTH EVERY DAY 04/12/14   Einar Pheasant, MD  Multiple Vitamin (MULTIVITAMIN) tablet Take 1 tablet by mouth daily.      Historical Provider, MD  naproxen sodium (ANAPROX) 220 MG tablet Take 220 mg by mouth 2 (two) times daily with a meal.    Historical Provider, MD  NASAL SPRAY SALINE NA by Nasal route as directed.      Historical Provider, MD  nystatin (MYCOSTATIN/NYSTOP) 100000 UNIT/GM POWD Sprinkle powder to the affected rash daily 01/25/15   Janne Napoleon, NP  omeprazole (PRILOSEC) 20 MG capsule TAKE ONE CAPSULE BY MOUTH EVERY DAY *NEEDS OFFICE VISIT*    Einar Pheasant, MD  venlafaxine (EFFEXOR-XR) 150 MG 24 hr capsule Take 300 mg by mouth daily.      Historical Provider, MD  VYTORIN 10-20 MG per tablet TAKE 1 TABLET EVERY DAY 03/20/13   Einar Pheasant, MD   BP 145/96 mmHg  Pulse 97  Temp(Src) 98.3 F (36.8 C) (Oral)  Resp 20  SpO2 94% Physical Exam  Constitutional: She appears well-nourished. No distress.  Eyes: Conjunctivae and EOM are normal.  Neck: Normal range of motion. Neck supple.  Minor ecchymosis to the left lateral neck. No palpable hematomas. Minor lateral muscle tenderness.  Cardiovascular: Normal rate, regular rhythm and normal heart sounds.   Pulmonary/Chest: Effort normal. No respiratory distress.  Musculoskeletal:  There is ecchymosis and swelling to the left hand and in particular over the fourth and fifth metacarpal and fifth digit. There is a laceration nearly circumscribing the fifth digit that is gaping. No signs of infection. No erythema, exudates or other drainage. There is tenderness to the fifth digit. The patient is able to flex the  DIP approximately 20. Extension against resistance is normal at 180.  Lymphadenopathy:    She has no cervical adenopathy.  Neurological: She is alert.  Skin: Skin is warm and dry.  Psychiatric: She has a normal mood and affect.  Nursing note and vitals reviewed.   ED Course  Procedures (including critical care time) Labs Review Labs Reviewed - No data to display  Imaging Review Dg Hand Complete Left  04/26/2015   CLINICAL DATA:  Fall. Bruising and tenderness. Small finger laceration. Injury Thursday night.  EXAM: LEFT HAND - COMPLETE 3+ VIEW  COMPARISON:  None.  FINDINGS: Deformity of the ulnar base of the ring finger proximal phalanx is present, most consistent with an  old fracture with healing. There is no acute fracture identified. Soft tissue swelling is present in the fourth web space. Base of the small finger demonstrates marked soft tissue swelling with some skin irregularity suggesting laceration.  IMPRESSION: 1. No acute osseous abnormality. Deformity of the base of the ring finger proximal phalanx appears compatible with a healed fracture. 2. Soft tissue swelling in the fourth web space and base of the small finger.   Electronically Signed   By: Dereck Ligas M.D.   On: 04/26/2015 16:47   Wound cleaning, irrigation, surgical scrub, jet lavage.   MDM   1. Fall from bed, initial encounter   2. Finger laceration, initial encounter   3. Hand contusion, left, initial encounter    Wound care as discussed Dressing and neosporin for 1 to 2 days only.  See the hand specialist next week for wound check Keflex tid for 3 d.   Janne Napoleon, NP 04/26/15 1721

## 2015-04-26 NOTE — Discharge Instructions (Signed)
Hand Contusion A hand contusion is a deep bruise on your hand area. Contusions are the result of an injury that caused bleeding under the skin. The contusion may turn blue, purple, or yellow. Minor injuries will give you a painless contusion, but more severe contusions may stay painful and swollen for a few weeks. CAUSES  A contusion is usually caused by a blow, trauma, or direct force to an area of the body. SYMPTOMS   Swelling and redness of the injured area.  Discoloration of the injured area.  Tenderness and soreness of the injured area.  Pain. DIAGNOSIS  The diagnosis can be made by taking a history and performing a physical exam. An X-ray, CT scan, or MRI may be needed to determine if there were any associated injuries, such as broken bones (fractures). TREATMENT  Often, the best treatment for a hand contusion is resting, elevating, icing, and applying cold compresses to the injured area. Over-the-counter medicines may also be recommended for pain control. HOME CARE INSTRUCTIONS   Put ice on the injured area.  Put ice in a plastic bag.  Place a towel between your skin and the bag.  Leave the ice on for 15-20 minutes, 03-04 times a day.  Only take over-the-counter or prescription medicines as directed by your caregiver. Your caregiver may recommend avoiding anti-inflammatory medicines (aspirin, ibuprofen, and naproxen) for 48 hours because these medicines may increase bruising.  If told, use an elastic wrap as directed. This can help reduce swelling. You may remove the wrap for sleeping, showering, and bathing. If your fingers become numb, cold, or blue, take the wrap off and reapply it more loosely.  Elevate your hand with pillows to reduce swelling.  Avoid overusing your hand if it is painful. SEEK IMMEDIATE MEDICAL CARE IF:   You have increased redness, swelling, or pain in your hand.  Your swelling or pain is not relieved with medicines.  You have loss of feeling in  your hand or are unable to move your fingers.  Your hand turns cold or blue.  You have pain when you move your fingers.  Your hand becomes warm to the touch.  Your contusion does not improve in 2 days. MAKE SURE YOU:   Understand these instructions.  Will watch your condition.  Will get help right away if you are not doing well or get worse. Document Released: 04/02/2002 Document Revised: 07/05/2012 Document Reviewed: 04/03/2012 [facility] Patient Information 2015 [place], [place]. This information is not intended to replace advice given to you by your health care provider. Make sure you discuss any questions you have with your health care provider.  Laceration Care, Adult A laceration is a cut that goes through all layers of the skin. The cut goes into the tissue beneath the skin. HOME CARE For stitches (sutures) or staples:  Keep the cut clean and dry.  If you have a bandage (dressing), change it at least once a day. Change the bandage if it gets wet or dirty, or as told by your doctor.  Wash the cut with soap and water 2 times a day. Rinse the cut with water. Pat it dry with a clean towel.  Put a thin layer of medicated cream on the cut as told by your doctor Only twice.    Only take medicines as told by your doctor.  Have your stitches or staples removed as told by your doctor. For skin adhesive strips:  Keep the cut clean and dry.  Do not get the  strips wet. You may take a bath, but be careful to keep the cut dry.  If the cut gets wet, pat it dry with a clean towel.  The strips will fall off on their own. Do not remove the strips that are still stuck to the cut. For wound glue:  You may shower or take baths. Do not soak or scrub the cut. Do not swim. Avoid heavy sweating until the glue falls off on its own. After a shower or bath, pat the cut dry with a clean towel.  Do not put medicine on your cut until the glue falls off.  If you have a bandage, do not put tape  over the glue.  Avoid lots of sunlight or tanning lamps until the glue falls off. Put sunscreen on the cut for the first year to reduce your scar.  The glue will fall off on its own. Do not pick at the glue. You may need a tetanus shot if:  You cannot remember when you had your last tetanus shot.  You have never had a tetanus shot. If you need a tetanus shot and you choose not to have one, you may get tetanus. Sickness from tetanus can be serious. GET HELP RIGHT AWAY IF:   Your pain does not get better with medicine.  Your arm, hand, leg, or foot loses feeling (numbness) or changes color.  Your cut is bleeding.  Your joint feels weak, or you cannot use your joint.  You have painful lumps on your body.  Your cut is red, puffy (swollen), or painful.  You have a red line on the skin near the cut.  You have yellowish-white fluid (pus) coming from the cut.  You have a fever.  You have a bad smell coming from the cut or bandage.  Your cut breaks open before or after stitches are removed.  You notice something coming out of the cut, such as wood or glass.  You cannot move a finger or toe. MAKE SURE YOU:   Understand these instructions.  Will watch your condition.  Will get help right away if you are not doing well or get worse. Document Released: 03/29/2008 Document Revised: 01/03/2012 Document Reviewed: 04/06/2011 [facility] Patient Information 2015 [place], [place]. This information is not intended to replace advice given to you by your health care provider. Make sure you discuss any questions you have with your health care provider.

## 2015-05-08 ENCOUNTER — Other Ambulatory Visit: Payer: Self-pay | Admitting: Internal Medicine

## 2015-05-08 DIAGNOSIS — Z1231 Encounter for screening mammogram for malignant neoplasm of breast: Secondary | ICD-10-CM

## 2015-05-29 ENCOUNTER — Ambulatory Visit: Payer: Commercial Indemnity | Attending: Internal Medicine

## 2015-06-30 ENCOUNTER — Other Ambulatory Visit: Payer: Self-pay | Admitting: Internal Medicine

## 2015-07-07 ENCOUNTER — Ambulatory Visit: Payer: Commercial Indemnity

## 2015-09-11 ENCOUNTER — Ambulatory Visit: Payer: Commercial Indemnity | Attending: Internal Medicine

## 2015-10-28 ENCOUNTER — Other Ambulatory Visit: Payer: Self-pay

## 2015-10-28 DIAGNOSIS — Z1231 Encounter for screening mammogram for malignant neoplasm of breast: Secondary | ICD-10-CM

## 2015-11-17 ENCOUNTER — Ambulatory Visit: Payer: Commercial Indemnity

## 2015-12-04 ENCOUNTER — Ambulatory Visit
Admission: RE | Admit: 2015-12-04 | Discharge: 2015-12-04 | Disposition: A | Discharge status: Home / Self Care | Payer: Medicare Other | Source: Ambulatory Visit

## 2015-12-04 DIAGNOSIS — Z1231 Encounter for screening mammogram for malignant neoplasm of breast: Secondary | ICD-10-CM

## 2016-06-30 ENCOUNTER — Emergency Department (HOSPITAL_COMMUNITY): Payer: Medicare Other

## 2016-06-30 ENCOUNTER — Encounter (HOSPITAL_COMMUNITY): Payer: Self-pay | Admitting: Emergency Medicine

## 2016-06-30 ENCOUNTER — Observation Stay (HOSPITAL_COMMUNITY)
Admission: EM | Admit: 2016-06-30 | Discharge: 2016-07-03 | Disposition: A | Discharge status: Home Health | Payer: Medicare Other | Attending: Internal Medicine | Admitting: Internal Medicine

## 2016-06-30 DIAGNOSIS — J9601 Acute respiratory failure with hypoxia: Secondary | ICD-10-CM | POA: Insufficient documentation

## 2016-06-30 DIAGNOSIS — F329 Major depressive disorder, single episode, unspecified: Secondary | ICD-10-CM | POA: Insufficient documentation

## 2016-06-30 DIAGNOSIS — Z7901 Long term (current) use of anticoagulants: Secondary | ICD-10-CM | POA: Diagnosis not present

## 2016-06-30 DIAGNOSIS — F32A Depression, unspecified: Secondary | ICD-10-CM | POA: Diagnosis present

## 2016-06-30 DIAGNOSIS — Z6841 Body Mass Index (BMI) 40.0 and over, adult: Secondary | ICD-10-CM | POA: Diagnosis not present

## 2016-06-30 DIAGNOSIS — Z86711 Personal history of pulmonary embolism: Secondary | ICD-10-CM | POA: Insufficient documentation

## 2016-06-30 DIAGNOSIS — Z8542 Personal history of malignant neoplasm of other parts of uterus: Secondary | ICD-10-CM

## 2016-06-30 DIAGNOSIS — E039 Hypothyroidism, unspecified: Secondary | ICD-10-CM | POA: Diagnosis not present

## 2016-06-30 DIAGNOSIS — E119 Type 2 diabetes mellitus without complications: Secondary | ICD-10-CM

## 2016-06-30 DIAGNOSIS — Y92003 Bedroom of unspecified non-institutional (private) residence as the place of occurrence of the external cause: Secondary | ICD-10-CM | POA: Insufficient documentation

## 2016-06-30 DIAGNOSIS — W06XXXA Fall from bed, initial encounter: Secondary | ICD-10-CM | POA: Diagnosis not present

## 2016-06-30 DIAGNOSIS — S2249XA Multiple fractures of ribs, unspecified side, initial encounter for closed fracture: Secondary | ICD-10-CM | POA: Diagnosis present

## 2016-06-30 DIAGNOSIS — Z79899 Other long term (current) drug therapy: Secondary | ICD-10-CM | POA: Diagnosis not present

## 2016-06-30 DIAGNOSIS — R296 Repeated falls: Secondary | ICD-10-CM | POA: Insufficient documentation

## 2016-06-30 DIAGNOSIS — Z86718 Personal history of other venous thrombosis and embolism: Secondary | ICD-10-CM

## 2016-06-30 DIAGNOSIS — J309 Allergic rhinitis, unspecified: Secondary | ICD-10-CM | POA: Diagnosis not present

## 2016-06-30 DIAGNOSIS — J9621 Acute and chronic respiratory failure with hypoxia: Secondary | ICD-10-CM

## 2016-06-30 DIAGNOSIS — F419 Anxiety disorder, unspecified: Secondary | ICD-10-CM | POA: Insufficient documentation

## 2016-06-30 DIAGNOSIS — S2232XA Fracture of one rib, left side, initial encounter for closed fracture: Secondary | ICD-10-CM

## 2016-06-30 DIAGNOSIS — S2239XA Fracture of one rib, unspecified side, initial encounter for closed fracture: Secondary | ICD-10-CM

## 2016-06-30 DIAGNOSIS — I10 Essential (primary) hypertension: Secondary | ICD-10-CM | POA: Diagnosis not present

## 2016-06-30 DIAGNOSIS — E785 Hyperlipidemia, unspecified: Secondary | ICD-10-CM | POA: Diagnosis present

## 2016-06-30 DIAGNOSIS — E669 Obesity, unspecified: Secondary | ICD-10-CM

## 2016-06-30 DIAGNOSIS — E1169 Type 2 diabetes mellitus with other specified complication: Secondary | ICD-10-CM

## 2016-06-30 DIAGNOSIS — R0902 Hypoxemia: Secondary | ICD-10-CM | POA: Diagnosis present

## 2016-06-30 DIAGNOSIS — S2242XA Multiple fractures of ribs, left side, initial encounter for closed fracture: Secondary | ICD-10-CM | POA: Diagnosis present

## 2016-06-30 DIAGNOSIS — I272 Other secondary pulmonary hypertension: Secondary | ICD-10-CM | POA: Diagnosis not present

## 2016-06-30 LAB — I-STAT CHEM 8, ED
BUN: 18 mg/dL (ref 6–20)
CALCIUM ION: 1.3 mmol/L (ref 1.15–1.40)
CREATININE: 0.7 mg/dL (ref 0.44–1.00)
Chloride: 99 mmol/L — ABNORMAL LOW (ref 101–111)
Glucose, Bld: 140 mg/dL — ABNORMAL HIGH (ref 65–99)
HCT: 50 % — ABNORMAL HIGH (ref 36.0–46.0)
HEMOGLOBIN: 17 g/dL — AB (ref 12.0–15.0)
Potassium: 3.9 mmol/L (ref 3.5–5.1)
SODIUM: 140 mmol/L (ref 135–145)
TCO2: 30 mmol/L (ref 0–100)

## 2016-06-30 LAB — CBC WITH DIFFERENTIAL/PLATELET
Basophils Absolute: 0 10*3/uL (ref 0.0–0.1)
Basophils Relative: 0 %
EOS ABS: 0.2 10*3/uL (ref 0.0–0.7)
Eosinophils Relative: 2 %
HCT: 48 % — ABNORMAL HIGH (ref 36.0–46.0)
Hemoglobin: 16.1 g/dL — ABNORMAL HIGH (ref 12.0–15.0)
Lymphocytes Relative: 17 %
Lymphs Abs: 1.5 10*3/uL (ref 0.7–4.0)
MCH: 30.9 pg (ref 26.0–34.0)
MCHC: 33.5 g/dL (ref 30.0–36.0)
MCV: 92.1 fL (ref 78.0–100.0)
MONO ABS: 1 10*3/uL (ref 0.1–1.0)
MONOS PCT: 12 %
Neutro Abs: 6 10*3/uL (ref 1.7–7.7)
Neutrophils Relative %: 69 %
Platelets: 281 10*3/uL (ref 150–400)
RBC: 5.21 MIL/uL — ABNORMAL HIGH (ref 3.87–5.11)
RDW: 13.9 % (ref 11.5–15.5)
WBC: 8.6 10*3/uL (ref 4.0–10.5)

## 2016-06-30 LAB — PROTIME-INR
INR: 1.45
Prothrombin Time: 17.8 seconds — ABNORMAL HIGH (ref 11.4–15.2)

## 2016-06-30 LAB — CBG MONITORING, ED: GLUCOSE-CAPILLARY: 155 mg/dL — AB (ref 65–99)

## 2016-06-30 MED ORDER — FENTANYL CITRATE (PF) 100 MCG/2ML IJ SOLN
50.0000 ug | Freq: Once | INTRAMUSCULAR | Status: AC
  Administered 2016-06-30: 50 ug via INTRAVENOUS
  Filled 2016-06-30: qty 2

## 2016-06-30 MED ORDER — HYDROMORPHONE HCL 1 MG/ML IJ SOLN
1.0000 mg | INTRAMUSCULAR | Status: DC | PRN
  Administered 2016-06-30: 1 mg via INTRAVENOUS
  Filled 2016-06-30: qty 1

## 2016-06-30 MED ORDER — IOPAMIDOL (ISOVUE-300) INJECTION 61%
75.0000 mL | Freq: Once | INTRAVENOUS | Status: AC | PRN
  Administered 2016-06-30: 75 mL via INTRAVENOUS

## 2016-06-30 NOTE — ED Notes (Signed)
Bed: [id number] Expected date:  Expected time:  Means of arrival:  Comments: EMS- 75yo, fell out of bed/axilla pain/bed bound

## 2016-06-30 NOTE — ED Notes (Signed)
[facility] called for transport. 

## 2016-06-30 NOTE — ED Notes (Signed)
Patient transported to CT 

## 2016-06-30 NOTE — H&P (Signed)
History and Physical    Theresa Blake VEH:[id number] DOB: [birth year] DOA: 06/30/2016  PCP: Debbora Dus, MD   Patient coming from: Home.  Chief Complaint: Fall.  HPI: Theresa Blake is a 75 y.o. female with medical history significant of hypertension, pulmonary hypertension, pulmonary embolism on warfarin, allergic rhinitis, runny back pain, depression, type 2 diabetes, endometrial cancer, hyperlipidemia, morbid obesity was brought to the emergency department after having a fall at home sustaining facial and chest wall injury.  Per patient, she has been falling out of bed multiple times recently. She fell again today and hit her left side of face and chest with a dresser. She says that she immediately felt pain on her left lower rib cage, which got worse with breathing and improved when she applied pressure to the area. She denies any recent fever, chills, chest pressures, palpitations, diaphoresis, dizziness, pitting edema lower extremities.  ED Course: The patient received supplemental oxygen and analgesics reporting relief of pain. Imaging shows left-sided fifth, sixth and seventh rib fractures. Trauma surgeon was contacted due to the patient becoming hypoxic in the emergency department. Trauma surgery requested transfer to [facility].  Review of Systems: As per HPI otherwise 10 point review of systems negative.    Past Medical History:  Diagnosis Date  . Abnormal Pap smear of cervix   . Allergic rhinitis, cause unspecified   . Backache, unspecified   . Depression    mood disorder; ? bipolar  . Dermatomycosis, unspecified   . Diabetes mellitus type II   . Edema   . Foot fracture, right   . HLD (hyperlipidemia)   . HTN (hypertension)   . Hx pulmonary embolism    multiple  . Lumbar spinal stenosis   . Malignant neoplasm of corpus uteri, except isthmus ([place])   . Morbid obesity ([place])   . Other specified disease of hair and hair follicles   . Pain in joint, pelvic region and  thigh   . Panic disorder without agoraphobia     Past Surgical History:  Procedure Laterality Date  . BREAST BIOPSY     Right-benign  . ENDOMETRIAL BIOPSY  08/2006   attempted  . TONSILLECTOMY    . TUBAL LIGATION       reports that she has never smoked. She has never used smokeless tobacco. She reports that she does not drink alcohol or use drugs.  Allergies  Allergen Reactions  . Anesthetics, Halogenated Shortness Of Breath  . Tramadol     Doesn't remember reaction   . Lamotrigine Itching    Family History  Problem Relation Age of Onset  . Heart attack Father 34  . Pancreatic cancer Mother     with mets  . Hyperlipidemia Son   . Hyperlipidemia Son   . Gout Son     Prior to Admission medications   Medication Sig Start Date End Date Taking? Authorizing Provider  acetaminophen (TYLENOL) 500 MG tablet Take 1,000 mg by mouth every 6 (six) hours as needed for mild pain, moderate pain, fever or headache.   Yes Historical Provider, MD  b complex vitamins tablet Take 1 tablet by mouth daily.   Yes Historical Provider, MD  Blood Glucose Monitoring Suppl (ACCU-CHEK AVIVA PLUS) W/DEVICE KIT by Does not apply route.   Yes Historical Provider, MD  buPROPion (WELLBUTRIN XL) 150 MG 24 hr tablet Take 450 mg by mouth daily.    Yes Historical Provider, MD  clonazePAM (KLONOPIN) 1 MG tablet Take 1 mg by  mouth 3 (three) times daily. For anxiety   Yes Historical Provider, MD  ezetimibe-simvastatin (VYTORIN) 10-20 MG tablet Take 1 tablet by mouth daily.   Yes Historical Provider, MD  fluticasone (FLONASE) 50 MCG/ACT nasal spray Place 2 sprays into both nostrils daily.   Yes Historical Provider, MD  furosemide (LASIX) 20 MG tablet Take 20 mg by mouth daily. 06/17/16  Yes Historical Provider, MD  glucose blood (ACCU-CHEK AVIVA PLUS) test strip 1 each by Other route as needed for other. Use as instructed   Yes Historical Provider, MD  ibuprofen (ADVIL,MOTRIN) 200 MG tablet Take 400 mg by mouth  every 6 (six) hours as needed for fever, headache, mild pain, moderate pain or cramping.   Yes Historical Provider, MD  levothyroxine (SYNTHROID, LEVOTHROID) 50 MCG tablet Take 1 tablet (50 mcg total) by mouth daily before breakfast. 02/28/14  Yes Theresa Pheasant, MD  lisinopril (PRINIVIL,ZESTRIL) 20 MG tablet TAKE 1 TABLET BY MOUTH EVERY DAY 04/12/14  Yes Theresa Pheasant, MD  Lysine 500 MG TABS Take 1 tablet by mouth daily.   Yes Historical Provider, MD  Multiple Vitamin (MULTIVITAMIN) tablet Take 1 tablet by mouth daily.     Yes Historical Provider, MD  naproxen sodium (ANAPROX) 220 MG tablet Take 220-440 mg by mouth 2 (two) times daily as needed (for pain).    Yes Historical Provider, MD  neomycin-bacitracin-polymyxin (NEOSPORIN) ointment Apply 1 application topically as needed for wound care.   Yes Historical Provider, MD  nystatin (MYCOSTATIN/NYSTOP) 100000 UNIT/GM POWD Sprinkle powder to the affected rash daily 01/25/15  Yes Theresa Napoleon, NP  nystatin cream (MYCOSTATIN) Apply 1 application topically 3 (three) times daily as needed for rash. 06/03/16  Yes Historical Provider, MD  omeprazole (PRILOSEC) 20 MG capsule TAKE ONE CAPSULE BY MOUTH EVERY DAY *NEEDS OFFICE VISIT*   Yes Theresa Pheasant, MD  senna (SENOKOT) 8.6 MG TABS tablet Take 1 tablet by mouth daily as needed for mild constipation.   Yes Historical Provider, MD  venlafaxine (EFFEXOR-XR) 150 MG 24 hr capsule Take 300 mg by mouth daily.     Yes Historical Provider, MD  warfarin (COUMADIN) 3 MG tablet Take 3 mg by mouth 2 (two) times a week. Takes on Sunday and Saturday   Yes Historical Provider, MD  warfarin (COUMADIN) 4 MG tablet Take 4 mg by mouth See admin instructions. Takes on Monday thru Friday   Yes Historical Provider, MD  hydrochlorothiazide (HYDRODIURIL) 25 MG tablet TAKE 1 TABLET BY MOUTH EVERY DAY Patient not taking: Reported on 06/30/2016 11/22/13   Theresa Pheasant, MD  VYTORIN 10-20 MG per tablet TAKE 1 TABLET EVERY DAY Patient not  taking: Reported on 06/30/2016 03/20/13   Theresa Pheasant, MD    Physical Exam: Vitals:   06/30/16 1515 06/30/16 1525 06/30/16 1820  BP:   145/81  Pulse:  86 92  Resp:  16 16  Temp:  97.6 F (36.4 C)   TempSrc:  Oral   SpO2: 92% 90% 90%      Constitutional: NAD, calm, comfortable Vitals:   06/30/16 1515 06/30/16 1525 06/30/16 1820  BP:   145/81  Pulse:  86 92  Resp:  16 16  Temp:  97.6 F (36.4 C)   TempSrc:  Oral   SpO2: 92% 90% 90%   Eyes: PERRL, lids and conjunctivae normal ENMT: Mucous membranes are moist. Edema of left lower lip area. Posterior pharynx clear of any exudate or lesions. Neck: normal, supple, no masses, no thyromegaly Respiratory: Decreased breath sounds on  bases, otherwise CTA.  No accessory muscle use.  Chest: Positive chest wall tenderness on left mid-lower rib cage.  Cardiovascular: Regular rate and rhythm, no murmurs / rubs / gallops. No No pitting extremity edema. Positive lymphedema. 2+ pedal pulses. No carotid bruits.  Abdomen: Bowel sounds positive. Soft, no tenderness, no masses palpated. No hepatosplenomegaly.  Musculoskeletal: no clubbing / cyanosis. Good ROM, no contractures. Normal muscle tone.  Skin: no rashes, lesions, ulcers on limited skin exam. Neurologic: CN 2-12 grossly intact. Sensation intact, DTR normal. Strength 5/5 in all 4.  Psychiatric: Normal judgment and insight. Alert and oriented x 4. Normal mood.     Labs on Admission: I have personally reviewed following labs and imaging studies  CBC:  Recent Labs Lab 06/30/16 1515 06/30/16 1708  WBC 8.6  --   NEUTROABS 6.0  --   HGB 16.1* 17.0*  HCT 48.0* 50.0*  MCV 92.1  --   PLT 281  --    Basic Metabolic Panel:  Recent Labs Lab 06/30/16 1708  NA 140  K 3.9  CL 99*  GLUCOSE 140*  BUN 18  CREATININE 0.70   GFR: CrCl cannot be calculated (Unknown ideal weight.).  Coagulation Profile:  Recent Labs Lab 06/30/16 1515  INR 1.45    Urine analysis:      Component Value Date/Time   COLORURINE YELLOW 04/08/2012 1622   APPEARANCEUR CLEAR 04/08/2012 1622   LABSPEC 1.016 04/08/2012 1622   PHURINE 8.0 04/08/2012 1622   GLUCOSEU NEGATIVE 04/08/2012 1622   HGBUR NEGATIVE 04/08/2012 1622   HGBUR negative 02/10/2010 1135   BILIRUBINUR neg 06/11/2013 1510   KETONESUR NEGATIVE 04/08/2012 1622   PROTEINUR neg 06/11/2013 1510   PROTEINUR NEGATIVE 04/08/2012 1622   UROBILINOGEN 0.2 06/11/2013 1510   UROBILINOGEN 1.0 04/08/2012 1622   NITRITE pos 06/11/2013 1510   NITRITE NEGATIVE 04/08/2012 1622   LEUKOCYTESUR moderate (2+) 06/11/2013 1510    Radiological Exams on Admission: Ct Chest W Contrast  Result Date: 06/30/2016 CLINICAL DATA:  Fall from bed today, left chest wall pain EXAM: CT CHEST WITH CONTRAST TECHNIQUE: Multidetector CT imaging of the chest was performed during intravenous contrast administration. CONTRAST:  85m ISOVUE-300 IOPAMIDOL (ISOVUE-300) INJECTION 61% COMPARISON:  06/30/2016 chest radiograph FINDINGS: Despite efforts by the technologist and patient, motion artifact is present on today's exam and could not be eliminated. This reduces exam sensitivity and specificity. Body habitus reduces diagnostic sensitivity and specificity. Cardiovascular: Mild aortic arch and branch vessel atherosclerotic vascular disease. Mediastinum/Nodes: Unremarkable Lungs/Pleura: Mosaic attenuation in the lungs. No significant reduction in vascularity in the darker segments. Slight nodularity in the left upper lobe along the major fissure. Mild atelectasis in the anteromedial segment left lower lobe. Upper Abdomen: Hypodense lesion in segment 4 of the liver, 1.1 cm in diameter on image 122/3, highly ill-defined and nonspecific. Lax anterior abdominal wall partially hangs at outside of the field of view. Musculoskeletal: Nondisplaced fractures the left anterior fifth, sixth, and seventh ribs. Bony demineralization. Sternum intact. Mild upper thoracic kyphosis.  Mild thoracic spondylosis. IMPRESSION: 1. Nondisplaced fractures the left anterior fifth, sixth, and seventh ribs. 2. Nonspecific 1.1 cm hypodense lesion in segment 4 of the liver. Highly indistinct/blurred. 3. Mosaic attenuation in the lungs, with appearance favoring an airspace filling process as a cause, possibly low grade edema or extrinsic allergic alveolitis. 4. Mild atherosclerosis. Electronically Signed   By: WVan ClinesM.D.   On: 06/30/2016 18:11   Dg Chest Port 1 View  Result Date: 06/30/2016 CLINICAL DATA:  Left-sided chest pain following blunt trauma/fall today, initial encounter EXAM: PORTABLE CHEST 1 VIEW COMPARISON:  09/14/2005 FINDINGS: Cardiac shadow remains enlarged. The lungs are well aerated bilaterally. No pneumothorax is seen. Limited evaluation of the ribcage shows no acute abnormality. No other focal abnormality is seen. IMPRESSION: No definitive rib fracture is seen. No acute abnormality is noted on this limited exam. Electronically Signed   By: Inez Catalina M.D.   On: 06/30/2016 16:30    EKG: Independently reviewed.  Vent. rate 92 BPM PR interval * ms QRS duration 123 ms QT/QTc 482/597 ms P-R-T axes 0 54 44 Sinus rhythm Interpretation limited secondary to artifact No significant change when compared to previous.  Assessment/Plan Principal Problem:   Broken ribs   Multiple rib fractures   Admit to observation/telemetry. Continue analgesics as needed. Trauma surgery will evaluate at [facility]. Case management evaluation in the morning. (She lives with one of her 4 sons) I suggested to the patient to get a referral for a sleep study, given her falls while sleeping. She may benefit from using the bed with side rails to avoid falls if this becomes a problem.  Active Problems:   Hypoxia Secondary to rib fractures related hypoventilation. Continue supplemental oxygen.    Hypothyroidism Continue levothyroxine 50 g by mouth daily. Monitor TSH  periodically.    Diabetes mellitus type 2 in obese (HCC) Carbohydrate diet. CBG monitoring before meals.    Hyperlipidemia Continue Vytorin 10-20 milligrams by mouth daily. Monitor LFTs periodically.    Depression Continue Effexor 300 mg by mouth daily.    Essential hypertension Continue furosemide 20 mg by mouth daily. Continue hydrochlorothiazide 25 mg by mouth daily. Continue lisinopril 20 mg by mouth daily. Monitor blood pressure periodically.    PULMONARY EMBOLISM, HX OF Continue warfarin per pharmacy dosing.   DVT prophylaxis: On warfarin. Code Status: Full code. Family Communication:  Disposition Plan: Admit to [facility] for observation and trauma team evaluation. Consults called: Dr. Kae Heller (trauma surgery) Admission status: Observation/telemetry.   Reubin Milan MD [facility] Pager [phone].  If 7PM-7AM, please contact night-coverage [email or web address] Password [facility]  06/30/2016, 7:33 PM

## 2016-06-30 NOTE — ED Notes (Signed)
[facility] here to transfer pt to [facility]. 

## 2016-06-30 NOTE — ED Provider Notes (Signed)
[facility] DEPT Provider Note   CSN: [id number] Arrival date & time: 06/30/16  1500     History   Chief Complaint Chief Complaint  Patient presents with  . Fall    HPI Theresa Blake is a 75 y.o. female.with Past medical history of pulmonary embolism on Coumadin, endometrial cancer, presenting today after a fall. Patient states she has fallen several times out of her bed while being asleep. This occurred again today and she states she hit the dresser. The dresser hit her across her lips and face as well as the left side of her chest. She is having most of her pain over her left chest is concern for rib fracture. She states when she holds pressure to the area feels better. It hurts worse with a deep breath. She did not get any pain medication prior to arrival. She denies trauma elsewhere. There are no further complaints.    10 Systems reviewed and are negative for acute change except as noted in the HPI.    HPI  Past Medical History:  Diagnosis Date  . Abnormal Pap smear of cervix   . Allergic rhinitis, cause unspecified   . Backache, unspecified   . Depression    mood disorder; ? bipolar  . Dermatomycosis, unspecified   . Diabetes mellitus type II   . Edema   . Foot fracture, right   . HLD (hyperlipidemia)   . HTN (hypertension)   . Hx pulmonary embolism    multiple  . Lumbar spinal stenosis   . Malignant neoplasm of corpus uteri, except isthmus ([place])   . Morbid obesity ([place])   . Other specified disease of hair and hair follicles   . Pain in joint, pelvic region and thigh   . Panic disorder without agoraphobia     Patient Active Problem List   Diagnosis Date Noted  . Preop cardiovascular exam 03/23/2014  . Hypoxia 04/29/2013  . Spinal stenosis of lumbar region 04/12/2012  . Neck strain 11/05/2011  . Wound of right leg 11/05/2011  . Cellulitis and abscess of leg 10/13/2011  . Sinusitis 10/13/2011  . Vertigo 10/13/2011  . Other screening mammogram  03/15/2011  . BACK PAIN, LUMBAR 12/22/2010  . HIP PAIN, RIGHT 12/21/2010  . ALLERGIC RHINITIS 07/22/2010  . Morbid obesity ([place]) 02/27/2010  . FUNGAL DERMATITIS 08/15/2009  . HYPOTHYROIDISM 07/13/2007  . DIABETES MELLITUS, TYPE II 03/24/2007  . HYPERLIPIDEMIA 03/24/2007  . PANIC DISORDER 03/24/2007  . DEPRESSION 03/24/2007  . HYPERTENSION 03/24/2007  . FOLLICULITIS 62/37/6283  . BACK PAIN 03/24/2007  . EDEMA 03/24/2007  . PULMONARY EMBOLISM, HX OF 03/24/2007    Past Surgical History:  Procedure Laterality Date  . BREAST BIOPSY     Right-benign  . ENDOMETRIAL BIOPSY  08/2006   attempted  . TONSILLECTOMY    . TUBAL LIGATION      OB History    No data available       Home Medications    Prior to Admission medications   Medication Sig Start Date End Date Taking? Authorizing Provider  acetaminophen (TYLENOL) 500 MG tablet Take 1,000 mg by mouth every 6 (six) hours as needed for mild pain, moderate pain, fever or headache.   Yes Historical Provider, MD  b complex vitamins tablet Take 1 tablet by mouth daily.   Yes Historical Provider, MD  Blood Glucose Monitoring Suppl (ACCU-CHEK AVIVA PLUS) W/DEVICE KIT by Does not apply route.   Yes Historical Provider, MD  buPROPion (WELLBUTRIN XL) 150 MG 24  hr tablet Take 450 mg by mouth daily.    Yes Historical Provider, MD  clonazePAM (KLONOPIN) 1 MG tablet Take 1 mg by mouth 3 (three) times daily. For anxiety   Yes Historical Provider, MD  ezetimibe-simvastatin (VYTORIN) 10-20 MG tablet Take 1 tablet by mouth daily.   Yes Historical Provider, MD  fluticasone (FLONASE) 50 MCG/ACT nasal spray Place 2 sprays into both nostrils daily.   Yes Historical Provider, MD  furosemide (LASIX) 20 MG tablet Take 20 mg by mouth daily. 06/17/16  Yes Historical Provider, MD  glucose blood (ACCU-CHEK AVIVA PLUS) test strip 1 each by Other route as needed for other. Use as instructed   Yes Historical Provider, MD  ibuprofen (ADVIL,MOTRIN) 200 MG tablet  Take 400 mg by mouth every 6 (six) hours as needed for fever, headache, mild pain, moderate pain or cramping.   Yes Historical Provider, MD  levothyroxine (SYNTHROID, LEVOTHROID) 50 MCG tablet Take 1 tablet (50 mcg total) by mouth daily before breakfast. 02/28/14  Yes Einar Pheasant, MD  lisinopril (PRINIVIL,ZESTRIL) 20 MG tablet TAKE 1 TABLET BY MOUTH EVERY DAY 04/12/14  Yes Einar Pheasant, MD  Lysine 500 MG TABS Take 1 tablet by mouth daily.   Yes Historical Provider, MD  Multiple Vitamin (MULTIVITAMIN) tablet Take 1 tablet by mouth daily.     Yes Historical Provider, MD  naproxen sodium (ANAPROX) 220 MG tablet Take 220-440 mg by mouth 2 (two) times daily as needed (for pain).    Yes Historical Provider, MD  neomycin-bacitracin-polymyxin (NEOSPORIN) ointment Apply 1 application topically as needed for wound care.   Yes Historical Provider, MD  nystatin (MYCOSTATIN/NYSTOP) 100000 UNIT/GM POWD Sprinkle powder to the affected rash daily 01/25/15  Yes Janne Napoleon, NP  nystatin cream (MYCOSTATIN) Apply 1 application topically 3 (three) times daily as needed for rash. 06/03/16  Yes Historical Provider, MD  omeprazole (PRILOSEC) 20 MG capsule TAKE ONE CAPSULE BY MOUTH EVERY DAY *NEEDS OFFICE VISIT*   Yes Einar Pheasant, MD  senna (SENOKOT) 8.6 MG TABS tablet Take 1 tablet by mouth daily as needed for mild constipation.   Yes Historical Provider, MD  venlafaxine (EFFEXOR-XR) 150 MG 24 hr capsule Take 300 mg by mouth daily.     Yes Historical Provider, MD  warfarin (COUMADIN) 3 MG tablet Take 3 mg by mouth 2 (two) times a week. Takes on Sunday and Saturday   Yes Historical Provider, MD  warfarin (COUMADIN) 4 MG tablet Take 4 mg by mouth See admin instructions. Takes on Monday thru Friday   Yes Historical Provider, MD  hydrochlorothiazide (HYDRODIURIL) 25 MG tablet TAKE 1 TABLET BY MOUTH EVERY DAY Patient not taking: Reported on 06/30/2016 11/22/13   Einar Pheasant, MD  VYTORIN 10-20 MG per tablet TAKE 1 TABLET EVERY  DAY Patient not taking: Reported on 06/30/2016 03/20/13   Einar Pheasant, MD    Family History Family History  Problem Relation Age of Onset  . Heart attack Father 10  . Pancreatic cancer Mother     with mets  . Hyperlipidemia Son   . Hyperlipidemia Son   . Gout Son     Social History Social History  Substance Use Topics  . Smoking status: Never Smoker  . Smokeless tobacco: Never Used  . Alcohol use No     Allergies   Anesthetics, halogenated; Tramadol; and Lamotrigine   Review of Systems Review of Systems   Physical Exam Updated Vital Signs BP 145/81   Pulse 92   Temp 97.6 F (36.4  C) (Oral)   Resp 16   SpO2 90%   Physical Exam  Constitutional: She is oriented to person, place, and time. She appears well-developed and well-nourished. No distress.  Obese female  HENT:  Head: Normocephalic and atraumatic.  Nose: Nose normal.  Mouth/Throat: Oropharynx is clear and moist. No oropharyngeal exudate.  Mild swelling to the left lower lip. No other signs of head or facial trauma. No tenderness to palpation.  Eyes: Conjunctivae and EOM are normal. Pupils are equal, round, and reactive to light. No scleral icterus.  Neck: Normal range of motion. Neck supple. No JVD present. No tracheal deviation present. No thyromegaly present.  Cardiovascular: Normal rate, regular rhythm and normal heart sounds.  Exam reveals no gallop and no friction rub.   No murmur heard. Pulmonary/Chest: Effort normal and breath sounds normal. No respiratory distress. She has no wheezes. She exhibits tenderness.  Significant chest wall tenderness on the left just under the breast. No bony deformity palpated.  Abdominal: Soft. Bowel sounds are normal. She exhibits no distension and no mass. There is no tenderness. There is no rebound and no guarding.  Musculoskeletal: Normal range of motion. She exhibits no edema or tenderness.  Lymphadenopathy:    She has no cervical adenopathy.  Neurological: She  is alert and oriented to person, place, and time. No cranial nerve deficit. She exhibits normal muscle tone.  Skin: Skin is warm and dry. No rash noted. No erythema. No pallor.  Nursing note and vitals reviewed.    ED Treatments / Results  Labs (all labs ordered are listed, but only abnormal results are displayed) Labs Reviewed  CBC WITH DIFFERENTIAL/PLATELET - Abnormal; Notable for the following:       Result Value   RBC 5.21 (*)    Hemoglobin 16.1 (*)    HCT 48.0 (*)    All other components within normal limits  PROTIME-INR - Abnormal; Notable for the following:    Prothrombin Time 17.8 (*)    All other components within normal limits  I-STAT CHEM 8, ED - Abnormal; Notable for the following:    Chloride 99 (*)    Glucose, Bld 140 (*)    Hemoglobin 17.0 (*)    HCT 50.0 (*)    All other components within normal limits    EKG  EKG Interpretation  Date/Time:  Wednesday June 30 2016 15:59:40 EDT Ventricular Rate:  92 PR Interval:    QRS Duration: 123 QT Interval:  482 QTC Calculation: 597 R Axis:   54 Text Interpretation:  Sinus rhythm Interpretation limited secondary to artifact No significant change since last tracing Confirmed by Glynn Octave [phone]) on 06/30/2016 4:22:12 PM       Radiology Ct Chest W Contrast  Result Date: 06/30/2016 CLINICAL DATA:  Fall from bed today, left chest wall pain EXAM: CT CHEST WITH CONTRAST TECHNIQUE: Multidetector CT imaging of the chest was performed during intravenous contrast administration. CONTRAST:  92m ISOVUE-300 IOPAMIDOL (ISOVUE-300) INJECTION 61% COMPARISON:  06/30/2016 chest radiograph FINDINGS: Despite efforts by the technologist and patient, motion artifact is present on today's exam and could not be eliminated. This reduces exam sensitivity and specificity. Body habitus reduces diagnostic sensitivity and specificity. Cardiovascular: Mild aortic arch and branch vessel atherosclerotic vascular disease.  Mediastinum/Nodes: Unremarkable Lungs/Pleura: Mosaic attenuation in the lungs. No significant reduction in vascularity in the darker segments. Slight nodularity in the left upper lobe along the major fissure. Mild atelectasis in the anteromedial segment left lower lobe. Upper Abdomen: Hypodense lesion  in segment 4 of the liver, 1.1 cm in diameter on image 122/3, highly ill-defined and nonspecific. Lax anterior abdominal wall partially hangs at outside of the field of view. Musculoskeletal: Nondisplaced fractures the left anterior fifth, sixth, and seventh ribs. Bony demineralization. Sternum intact. Mild upper thoracic kyphosis. Mild thoracic spondylosis. IMPRESSION: 1. Nondisplaced fractures the left anterior fifth, sixth, and seventh ribs. 2. Nonspecific 1.1 cm hypodense lesion in segment 4 of the liver. Highly indistinct/blurred. 3. Mosaic attenuation in the lungs, with appearance favoring an airspace filling process as a cause, possibly low grade edema or extrinsic allergic alveolitis. 4. Mild atherosclerosis. Electronically Signed   By: Van Clines M.D.   On: 06/30/2016 18:11   Dg Chest Port 1 View  Result Date: 06/30/2016 CLINICAL DATA:  Left-sided chest pain following blunt trauma/fall today, initial encounter EXAM: PORTABLE CHEST 1 VIEW COMPARISON:  09/14/2005 FINDINGS: Cardiac shadow remains enlarged. The lungs are well aerated bilaterally. No pneumothorax is seen. Limited evaluation of the ribcage shows no acute abnormality. No other focal abnormality is seen. IMPRESSION: No definitive rib fracture is seen. No acute abnormality is noted on this limited exam. Electronically Signed   By: Inez Catalina M.D.   On: 06/30/2016 16:30    Procedures Procedures (including critical care time)  Medications Ordered in ED Medications  fentaNYL (SUBLIMAZE) injection 50 mcg (50 mcg Intravenous Given 06/30/16 1821)  iopamidol (ISOVUE-300) 61 % injection 75 mL (75 mLs Intravenous Contrast Given 06/30/16 1742)       Initial Impression / Assessment and Plan / ED Course  I have reviewed the triage vital signs and the nursing notes.  Pertinent labs & imaging results that were available during my care of the patient were reviewed by me and considered in my medical decision making (see chart for details).  Clinical Course    Patient presents emergency department for a fall with left chest wall tenderness. Patient states she does take Coumadin thus she is at high risk for intrathoracic injury. Will obtain CT scan for evaluation. Patient given fentanyl for pain control. Labs and INR pending.  CT reveals rib # of L ant 5,6 and 7th ribs.  When taken off oxygen, patient is 85% on RA.  She is 91% on [facility].  She normally does not wear oxygen at home.  Her weight certainly does not help with this.  Will page trauma surgery for admission.    7:25 PM Dr. Kae Heller with trauma surgery recommends for medicine admission at [facility].  She will be sure someone will consult along side.  Dr. Myna Hidalgo accepts the admission/transfer.  Final Clinical Impressions(s) / ED Diagnoses   Final diagnoses:  None    New Prescriptions New Prescriptions   No medications on file     Everlene Balls, MD 06/30/16 1926

## 2016-06-30 NOTE — Progress Notes (Signed)
Received report from [facility] ED RN Frederico Hamman.

## 2016-06-30 NOTE — ED Triage Notes (Addendum)
Per EMS, pt from home, reports "rolling out" of her bed this morning and bumping her head on the bedside table. Pt c/o left chest wall pain. Denies LOC, neck and back pain.  Pt also reports right side face pain from where she "hit the bedside table."

## 2016-06-30 NOTE — ED Notes (Signed)
Hospitalist at bedside 

## 2016-07-01 ENCOUNTER — Observation Stay (HOSPITAL_COMMUNITY): Payer: Medicare Other

## 2016-07-01 DIAGNOSIS — E669 Obesity, unspecified: Secondary | ICD-10-CM

## 2016-07-01 DIAGNOSIS — I10 Essential (primary) hypertension: Secondary | ICD-10-CM | POA: Diagnosis not present

## 2016-07-01 DIAGNOSIS — R0902 Hypoxemia: Secondary | ICD-10-CM

## 2016-07-01 DIAGNOSIS — E039 Hypothyroidism, unspecified: Secondary | ICD-10-CM

## 2016-07-01 DIAGNOSIS — F329 Major depressive disorder, single episode, unspecified: Secondary | ICD-10-CM | POA: Diagnosis not present

## 2016-07-01 DIAGNOSIS — E119 Type 2 diabetes mellitus without complications: Secondary | ICD-10-CM | POA: Diagnosis not present

## 2016-07-01 DIAGNOSIS — Z86718 Personal history of other venous thrombosis and embolism: Secondary | ICD-10-CM

## 2016-07-01 DIAGNOSIS — E785 Hyperlipidemia, unspecified: Secondary | ICD-10-CM

## 2016-07-01 DIAGNOSIS — S2242XA Multiple fractures of ribs, left side, initial encounter for closed fracture: Secondary | ICD-10-CM | POA: Diagnosis not present

## 2016-07-01 DIAGNOSIS — S2232XA Fracture of one rib, left side, initial encounter for closed fracture: Secondary | ICD-10-CM | POA: Diagnosis not present

## 2016-07-01 LAB — CBC WITH DIFFERENTIAL/PLATELET
BASOS ABS: 0 10*3/uL (ref 0.0–0.1)
Basophils Relative: 0 %
EOS ABS: 0.1 10*3/uL (ref 0.0–0.7)
EOS PCT: 1 %
HCT: 48.6 % — ABNORMAL HIGH (ref 36.0–46.0)
Hemoglobin: 15.6 g/dL — ABNORMAL HIGH (ref 12.0–15.0)
LYMPHS PCT: 16 %
Lymphs Abs: 1.4 10*3/uL (ref 0.7–4.0)
MCH: 30.6 pg (ref 26.0–34.0)
MCHC: 32.1 g/dL (ref 30.0–36.0)
MCV: 95.3 fL (ref 78.0–100.0)
Monocytes Absolute: 1 10*3/uL (ref 0.1–1.0)
Monocytes Relative: 11 %
Neutro Abs: 6 10*3/uL (ref 1.7–7.7)
Neutrophils Relative %: 72 %
PLATELETS: 296 10*3/uL (ref 150–400)
RBC: 5.1 MIL/uL (ref 3.87–5.11)
RDW: 14.1 % (ref 11.5–15.5)
WBC: 8.4 10*3/uL (ref 4.0–10.5)

## 2016-07-01 LAB — COMPREHENSIVE METABOLIC PANEL
ALBUMIN: 3.3 g/dL — AB (ref 3.5–5.0)
ALT: 19 U/L (ref 14–54)
ANION GAP: 8 (ref 5–15)
AST: 14 U/L — ABNORMAL LOW (ref 15–41)
Alkaline Phosphatase: 72 U/L (ref 38–126)
BUN: 13 mg/dL (ref 6–20)
CO2: 30 mmol/L (ref 22–32)
Calcium: 10.2 mg/dL (ref 8.9–10.3)
Chloride: 101 mmol/L (ref 101–111)
Creatinine, Ser: 0.69 mg/dL (ref 0.44–1.00)
GFR calc Af Amer: >60 mL/min (ref >60)
GFR calc non Af Amer: >60 mL/min (ref >60)
GLUCOSE: 146 mg/dL — AB (ref 65–99)
POTASSIUM: 3.6 mmol/L (ref 3.5–5.1)
SODIUM: 139 mmol/L (ref 135–145)
Total Bilirubin: 0.6 mg/dL (ref 0.3–1.2)
Total Protein: 6.3 g/dL — ABNORMAL LOW (ref 6.5–8.1)

## 2016-07-01 LAB — PROTIME-INR
INR: 1.34
PROTHROMBIN TIME: 16.7 s — AB (ref 11.4–15.2)

## 2016-07-01 LAB — GLUCOSE, CAPILLARY
GLUCOSE-CAPILLARY: 146 mg/dL — AB (ref 65–99)
GLUCOSE-CAPILLARY: 176 mg/dL — AB (ref 65–99)
GLUCOSE-CAPILLARY: 145 mg/dL — AB (ref 65–99)
Glucose-Capillary: 123 mg/dL — ABNORMAL HIGH (ref 65–99)
Glucose-Capillary: 160 mg/dL — ABNORMAL HIGH (ref 65–99)

## 2016-07-01 MED ORDER — NYSTATIN 100000 UNIT/GM EX CREA: 1.0000 "application " | TOPICAL_CREAM | Freq: Three times a day (TID) | CUTANEOUS | Status: DC | PRN

## 2016-07-01 MED ORDER — EZETIMIBE-SIMVASTATIN 10-20 MG PO TABS
1.0000 | ORAL_TABLET | Freq: Every day | ORAL | Status: DC
  Administered 2016-07-01 – 2016-07-03 (×3): 1 via ORAL
  Filled 2016-07-01 (×3): qty 1

## 2016-07-01 MED ORDER — WARFARIN SODIUM 2 MG PO TABS
2.0000 mg | ORAL_TABLET | ORAL | Status: AC
  Administered 2016-07-01: 2 mg via ORAL
  Filled 2016-07-01: qty 1

## 2016-07-01 MED ORDER — GABAPENTIN 300 MG PO CAPS
300.0000 mg | ORAL_CAPSULE | Freq: Three times a day (TID) | ORAL | Status: DC
  Administered 2016-07-01 – 2016-07-03 (×9): 300 mg via ORAL
  Filled 2016-07-01 (×9): qty 1

## 2016-07-01 MED ORDER — CLONAZEPAM 1 MG PO TABS
1.0000 mg | ORAL_TABLET | Freq: Three times a day (TID) | ORAL | Status: DC
  Administered 2016-07-01: 1 mg via ORAL
  Filled 2016-07-01: qty 1

## 2016-07-01 MED ORDER — FLUTICASONE PROPIONATE 50 MCG/ACT NA SUSP
2.0000 | Freq: Every day | NASAL | Status: DC
  Administered 2016-07-01 – 2016-07-03 (×3): 2 via NASAL
  Filled 2016-07-01: qty 16

## 2016-07-01 MED ORDER — IBUPROFEN 400 MG PO TABS
800.0000 mg | ORAL_TABLET | Freq: Three times a day (TID) | ORAL | Status: DC
  Administered 2016-07-01 – 2016-07-03 (×9): 800 mg via ORAL
  Filled 2016-07-01 (×9): qty 2

## 2016-07-01 MED ORDER — ACETAMINOPHEN 500 MG PO TABS: 1000.0000 mg | ORAL_TABLET | Freq: Four times a day (QID) | ORAL | Status: DC | PRN

## 2016-07-01 MED ORDER — LYSINE 500 MG PO TABS: 1.0000 | ORAL_TABLET | Freq: Every day | ORAL | Status: DC

## 2016-07-01 MED ORDER — BUPROPION HCL ER (XL) 150 MG PO TB24
450.0000 mg | ORAL_TABLET | Freq: Every day | ORAL | Status: DC
  Administered 2016-07-01 – 2016-07-03 (×3): 450 mg via ORAL
  Filled 2016-07-01 (×3): qty 3

## 2016-07-01 MED ORDER — ONDANSETRON HCL 4 MG/2ML IJ SOLN: 4.0000 mg | Freq: Four times a day (QID) | INTRAMUSCULAR | Status: DC | PRN

## 2016-07-01 MED ORDER — WARFARIN SODIUM 4 MG PO TABS
4.0000 mg | ORAL_TABLET | Freq: Once | ORAL | Status: AC
  Administered 2016-07-01: 4 mg via ORAL
  Filled 2016-07-01: qty 1

## 2016-07-01 MED ORDER — DOCUSATE SODIUM 100 MG PO CAPS
100.0000 mg | ORAL_CAPSULE | Freq: Two times a day (BID) | ORAL | Status: DC
  Administered 2016-07-01 – 2016-07-03 (×6): 100 mg via ORAL
  Filled 2016-07-01 (×7): qty 1

## 2016-07-01 MED ORDER — HYDROMORPHONE HCL 1 MG/ML IJ SOLN: 0.2000 mg | INTRAMUSCULAR | Status: DC | PRN

## 2016-07-01 MED ORDER — VENLAFAXINE HCL ER 75 MG PO CP24
300.0000 mg | ORAL_CAPSULE | Freq: Every day | ORAL | Status: DC
  Administered 2016-07-01 – 2016-07-03 (×3): 300 mg via ORAL
  Filled 2016-07-01 (×3): qty 4

## 2016-07-01 MED ORDER — FUROSEMIDE 20 MG PO TABS
20.0000 mg | ORAL_TABLET | Freq: Every day | ORAL | Status: DC
  Administered 2016-07-01 – 2016-07-03 (×3): 20 mg via ORAL
  Filled 2016-07-01 (×3): qty 1

## 2016-07-01 MED ORDER — LISINOPRIL 20 MG PO TABS
20.0000 mg | ORAL_TABLET | Freq: Every day | ORAL | Status: DC
  Administered 2016-07-01 – 2016-07-03 (×3): 20 mg via ORAL
  Filled 2016-07-01 (×3): qty 1

## 2016-07-01 MED ORDER — SENNA 8.6 MG PO TABS: 1.0000 | ORAL_TABLET | Freq: Every day | ORAL | Status: DC | PRN

## 2016-07-01 MED ORDER — PANTOPRAZOLE SODIUM 40 MG PO TBEC
40.0000 mg | DELAYED_RELEASE_TABLET | Freq: Every day | ORAL | Status: DC
  Administered 2016-07-01 – 2016-07-03 (×3): 40 mg via ORAL
  Filled 2016-07-01 (×3): qty 1

## 2016-07-01 MED ORDER — BACITRACIN-NEOMYCIN-POLYMYXIN 400-5-5000 EX OINT: 1.0000 "application " | TOPICAL_OINTMENT | CUTANEOUS | Status: DC | PRN

## 2016-07-01 MED ORDER — SODIUM CHLORIDE 0.9% FLUSH
3.0000 mL | Freq: Two times a day (BID) | INTRAVENOUS | Status: DC
  Administered 2016-07-01 – 2016-07-03 (×6): 3 mL via INTRAVENOUS

## 2016-07-01 MED ORDER — INSULIN ASPART 100 UNIT/ML ~~LOC~~ SOLN
0.0000 [IU] | Freq: Three times a day (TID) | SUBCUTANEOUS | Status: DC
  Administered 2016-07-01: 3 [IU] via SUBCUTANEOUS
  Administered 2016-07-02: 5 [IU] via SUBCUTANEOUS
  Administered 2016-07-02 – 2016-07-03 (×2): 3 [IU] via SUBCUTANEOUS
  Administered 2016-07-03: 2 [IU] via SUBCUTANEOUS
  Administered 2016-07-03: 3 [IU] via SUBCUTANEOUS

## 2016-07-01 MED ORDER — OXYCODONE HCL 5 MG PO TABS: 5.0000 mg | ORAL_TABLET | Freq: Four times a day (QID) | ORAL | Status: DC | PRN

## 2016-07-01 MED ORDER — B COMPLEX-C PO TABS
1.0000 | ORAL_TABLET | Freq: Every day | ORAL | Status: DC
  Administered 2016-07-01 – 2016-07-03 (×3): 1 via ORAL
  Filled 2016-07-01 (×3): qty 1

## 2016-07-01 MED ORDER — METHOCARBAMOL 500 MG PO TABS
1000.0000 mg | ORAL_TABLET | Freq: Three times a day (TID) | ORAL | Status: DC | PRN
Start: 2016-07-01 — End: 2016-07-04

## 2016-07-01 MED ORDER — INSULIN ASPART 100 UNIT/ML ~~LOC~~ SOLN: 0.0000 [IU] | Freq: Every day | SUBCUTANEOUS | Status: DC

## 2016-07-01 MED ORDER — ADULT MULTIVITAMIN W/MINERALS CH
1.0000 | ORAL_TABLET | Freq: Every day | ORAL | Status: DC
  Administered 2016-07-01 – 2016-07-03 (×3): 1 via ORAL
  Filled 2016-07-01 (×3): qty 1

## 2016-07-01 MED ORDER — CLONAZEPAM 0.5 MG PO TABS
0.5000 mg | ORAL_TABLET | Freq: Three times a day (TID) | ORAL | Status: DC
  Administered 2016-07-01 – 2016-07-03 (×9): 0.5 mg via ORAL
  Filled 2016-07-01 (×10): qty 1

## 2016-07-01 MED ORDER — WARFARIN - PHARMACIST DOSING INPATIENT
Freq: Every day | Status: DC
  Administered 2016-07-02: 19:00:00

## 2016-07-01 MED ORDER — LEVOTHYROXINE SODIUM 50 MCG PO TABS
50.0000 ug | ORAL_TABLET | Freq: Every day | ORAL | Status: DC
  Administered 2016-07-01 – 2016-07-03 (×3): 50 ug via ORAL
  Filled 2016-07-01 (×3): qty 1

## 2016-07-01 MED ORDER — ACETAMINOPHEN 325 MG PO TABS
650.0000 mg | ORAL_TABLET | Freq: Four times a day (QID) | ORAL | Status: DC
  Administered 2016-07-01 – 2016-07-03 (×10): 650 mg via ORAL
  Filled 2016-07-01 (×11): qty 2

## 2016-07-01 NOTE — Evaluation (Signed)
Occupational Therapy Evaluation Patient Details Name: Theresa Blake MRN: [id number] DOB: 10-18-1941 Today's Date: 07/01/2016    History of Present Illness Theresa Blake is a 75yo woman with multiple comorbidities including SS, HTN, pulmonary HTN, PE on warfarin, depression, type 2 diabetes, endometrial cancer and morbid obesity who presented with complaints of left chest wall pain after a fall out of bed.  Found to have rib fx 5-7 and facial injury.     Clinical Impression   PTA, pt primarily bedbound and required assistance for bathing and dressing from her son and used w/c for mobility. Pt currently requires max +2 assist for bed mobility, LB ADLs at bed level and pericare. Pt is limited by pain in L chest and LE weakness even with 2-person assist. Do not feel pt can safely return home as her son is her only caregiver. Recommend SNF for post-acute rehab or pt will have to return home with [facility], bariatric hospital bed and hoyer lift. Will continue to follow acutely.    Follow Up Recommendations  SNF;Supervision/Assistance - 24 hour    Equipment Recommendations  Other (comment) (TBD at next venue of care)    Recommendations for Other Services       Precautions / Restrictions Precautions Precautions: Fall Precaution Comments: falls from bed to L side Restrictions Weight Bearing Restrictions: No      Mobility Bed Mobility Overal bed mobility: Needs Assistance;+2 for physical assistance Bed Mobility: Rolling;Sidelying to Sit;Sit to Supine Rolling: Mod assist Sidelying to sit: Max assist;+2 for physical assistance;HOB elevated   Sit to supine: Max assist;+2 for physical assistance;Total assist   General bed mobility comments: slow and severely painful lifting trunk upright (was able to scoot legs off bed,) assist to supine due to sliding too close to EOB, assist for legs in bed and trunk to lower  Transfers                 General transfer comment: unable due to pain and  unsafe with pt sliding off EOB    Balance Overall balance assessment: Needs assistance Sitting-balance support: Bilateral upper extremity supported Sitting balance-Leahy Scale: Poor Sitting balance - Comments: needs support due to air mattress and hips sliding too close to EOB                                    ADL Overall ADL's : Needs assistance/impaired Eating/Feeding: Set up;Bed level   Grooming: Wash/dry hands;Wash/dry face;Set up;Bed level   Upper Body Bathing: Maximal assistance;Bed level   Lower Body Bathing: Total assistance;Bed level   Upper Body Dressing : Maximal assistance;Bed level   Lower Body Dressing: Total assistance;+2 for physical assistance;Bed level                       Vision Vision Assessment?: No apparent visual deficits   Perception     Praxis      Pertinent Vitals/Pain Pain Assessment: Faces Faces Pain Scale: Hurts worst Pain Location: L ribs with movement Pain Descriptors / Indicators: Moaning;Grimacing;Guarding Pain Intervention(s): Limited activity within patient's tolerance;Monitored during session;Repositioned     Hand Dominance Right   Extremity/Trunk Assessment Upper Extremity Assessment Upper Extremity Assessment: Generalized weakness (L>R due to rib fx)   Lower Extremity Assessment Lower Extremity Assessment: Defer to PT evaluation RLE Deficits / Details: AROM WFL, except knee flexion about 80 in supine, strength hip flexion 2+/5, knee extension 4-/5  LLE Deficits / Details: AROM WFL except knee extension limited to about -15, strength hip flexion 3/5, knee extension 3+/5       Communication Communication Communication: No difficulties   Cognition Arousal/Alertness: Awake/alert Behavior During Therapy: WFL for tasks assessed/performed;Anxious Overall Cognitive Status: Within Functional Limits for tasks assessed                     General Comments       Exercises       Shoulder  Instructions      Home Living Family/patient expects to be discharged to:: Private residence Living Arrangements: Children (son) Available Help at Discharge: Family Type of Home: House Home Access: Ramped entrance     [place]: Two level;1/2 bath on main level Alternate Level Stairs-Number of Steps: stays in living room converted to her bedroom             Home Equipment: Bedside commode;Wheelchair - manual;Hospital bed (one bedrail that straps to bed frame)          Prior Functioning/Environment Level of Independence: Needs assistance  Gait / Transfers Assistance Needed: transfers to [facility] on her own per her report prior to this fall, uses w/c for all other mobility ADL's / Homemaking Assistance Needed: son assists with sponge baths and dressing on side of bed or laying; son completes all IADLs        OT Diagnosis: Generalized weakness;Acute pain   OT Problem List: Decreased strength;Decreased range of motion;Decreased activity tolerance;Impaired balance (sitting and/or standing);Decreased safety awareness;Decreased knowledge of use of DME or AE;Obesity;Pain;Cardiopulmonary status limiting activity   OT Treatment/Interventions: Self-care/ADL training;Therapeutic exercise;Energy conservation;DME and/or AE instruction;Therapeutic activities;Patient/family education;Balance training    OT Goals(Current goals can be found in the care plan section) Acute Rehab OT Goals Patient Stated Goal: To improve pain, return to prior functional level OT Goal Formulation: With patient Time For Goal Achievement: 07/15/16 Potential to Achieve Goals: Good ADL Goals Pt Will Perform Upper Body Bathing: with mod assist;sitting Pt Will Perform Lower Body Bathing: with mod assist;sit to/from stand Pt Will Transfer to Toilet: with mod assist;stand pivot transfer;bedside commode Pt Will Perform Toileting - Clothing Manipulation and hygiene: with mod assist;sit to/from stand Additional ADL Goal  #1: Pt will tolerate sitting EOB with min assist for 15 minutes in order to complete grooming tasks.   OT Frequency: Min 2X/week   Barriers to D/C:            Co-evaluation PT/OT/SLP Co-Evaluation/Treatment: Yes Reason for Co-Treatment: For patient/therapist safety   OT goals addressed during session: ADL's and self-care      End of Session Nurse Communication: Mobility status  Activity Tolerance: Patient limited by pain Patient left: in bed;with call bell/phone within reach;with bed alarm set   Time: [id number] OT Time Calculation (min): 45 min Charges:  OT General Charges $OT Visit: 1 Procedure OT Evaluation $OT Eval Moderate Complexity: 1 Procedure G-Codes: OT G-codes **NOT FOR INPATIENT CLASS** Functional Assessment Tool Used: clinical judgement Functional Limitation: Self care Self Care Current Status [id number]): At least 60 percent but less than 80 percent impaired, limited or restricted Self Care Goal Status [id number]): At least 40 percent but less than 60 percent impaired, limited or restricted  Redmond Baseman, OTR/L Pager: [phone] 07/01/2016, 2:24 PM

## 2016-07-01 NOTE — Evaluation (Signed)
Physical Therapy Evaluation Patient Details Name: Theresa Blake MRN: [id number] DOB: Dec 08, 1940 Today's Date: 07/01/2016   History of Present Illness  Theresa Blake is a 75yo woman with multiple comorbidities including SS, HTN, pulmonary HTN, PE on warfarin, depression, type 2 diabetes, endometrial cancer and morbid obesity who presented with complaints of left chest wall pain after a fall out of bed.  Found to have rib fx 5-7 and facial injury.    Clinical Impression  Patient presents with decreased independence with mobility due to deficits listed in PT problem list.  She is mainly limited by pain and LE weakness with decreased safety even with assist of 2 people.  Feel current home situation not adequate as only her one son there to assist.  Also patient feels she would be unable to tolerate transport in sitting position.  Would need either SNF level rehab if able to transition to inpatient status; or HHPT and ambulance transport home with possible hoyer lift and hospital bed.  PT to follow.     Follow Up Recommendations SNF;Supervision/Assistance - 24 hour    Equipment Recommendations  Hospital bed (bariatric)    Recommendations for Other Services       Precautions / Restrictions Precautions Precautions: Fall Precaution Comments: falls from bed to L side      Mobility  Bed Mobility Overal bed mobility: Needs Assistance;+2 for physical assistance Bed Mobility: Rolling;Sidelying to Sit;Sit to Supine Rolling: Mod assist Sidelying to sit: Max assist;+2 for physical assistance;HOB elevated   Sit to supine: Max assist;+2 for physical assistance;Total assist   General bed mobility comments: slow and severely painful lifting trunk upright (was able to scoot legs off bed,) assist to supine due to sliding too close to EOB, assist for legs in bed and trunk to lower  Transfers                 General transfer comment: unable due to pain and unsafe with pt sliding off  EOB  Ambulation/Gait                Stairs            Wheelchair Mobility    Modified Rankin (Stroke Patients Only)       Balance Overall balance assessment: Needs assistance Sitting-balance support: Bilateral upper extremity supported Sitting balance-Leahy Scale: Poor Sitting balance - Comments: needs support due to air mattress and hips sliding too close to EOB                                     Pertinent Vitals/Pain Pain Assessment: Faces Faces Pain Scale: Hurts worst Pain Location: L ribs with supine to sit Pain Descriptors / Indicators: Moaning;Guarding;Grimacing Pain Intervention(s): Monitored during session;Limited activity within patient's tolerance;Repositioned    Home Living Family/patient expects to be discharged to:: Private residence Living Arrangements: Children Available Help at Discharge: Family Type of Home: House Home Access: Ramped entrance     Home Layout: Two level;1/2 bath on main level Home Equipment: Bedside commode;Wheelchair - manual Additional Comments: has one bed rail that straps to mattress    Prior Function Level of Independence: Needs assistance   Gait / Transfers Assistance Needed: transfers to [facility] on her own per her report prior to this fall  ADL's / Homemaking Assistance Needed: son assists with sponge bathing        Hand Dominance  Extremity/Trunk Assessment   Upper Extremity Assessment: Defer to OT evaluation           Lower Extremity Assessment: RLE deficits/detail;LLE deficits/detail RLE Deficits / Details: AROM WFL, except knee flexion about 80 in supine, strength hip flexion 2+/5, knee extension 4-/5 LLE Deficits / Details: AROM WFL except knee extension limited to about -15, strength hip flexion 3/5, knee extension 3+/5     Communication   Communication: No difficulties  Cognition Arousal/Alertness: Awake/alert Behavior During Therapy: WFL for tasks  assessed/performed;Anxious Overall Cognitive Status: Within Functional Limits for tasks assessed                      General Comments General comments (skin integrity, edema, etc.): Patient with bruising L lateral thigh, reports swelling under chin on R side    Exercises        Assessment/Plan    PT Assessment Patient needs continued PT services  PT Diagnosis Acute pain;Generalized weakness   PT Problem List Decreased strength;Decreased mobility;Decreased safety awareness;Decreased activity tolerance;Decreased balance;Decreased knowledge of use of DME;Pain  PT Treatment Interventions DME instruction;Therapeutic activities;Functional mobility training;Balance training;Therapeutic exercise;Patient/family education   PT Goals (Current goals can be found in the Care Plan section) Acute Rehab PT Goals Patient Stated Goal: To improve pain, return to prior functional level PT Goal Formulation: With patient Time For Goal Achievement: 07/15/16 Potential to Achieve Goals: Good    Frequency Min 3X/week   Barriers to discharge        Co-evaluation PT/OT/SLP Co-Evaluation/Treatment: Yes Reason for Co-Treatment: For patient/therapist safety PT goals addressed during session: Mobility/safety with mobility;Balance         End of Session Equipment Utilized During Treatment: Oxygen Activity Tolerance: Patient limited by pain Patient left: in bed;with call bell/phone within reach;with bed alarm set      Functional Assessment Tool Used: Clinical Judgement Functional Limitation: Mobility: Walking and moving around Mobility: Walking and Moving Around Current Status [id number]): At least 60 percent but less than 80 percent impaired, limited or restricted Mobility: Walking and Moving Around Goal Status [phone]): At least 40 percent but less than 60 percent impaired, limited or restricted    Time: 0855-0939 PT Time Calculation (min) (ACUTE ONLY): 44 min   Charges:   PT  Evaluation $PT Eval Moderate Complexity: 1 Procedure PT Treatments $Therapeutic Activity: 8-22 mins   PT G Codes:   PT G-Codes **NOT FOR INPATIENT CLASS** Functional Assessment Tool Used: Clinical Judgement Functional Limitation: Mobility: Walking and moving around Mobility: Walking and Moving Around Current Status [id number]): At least 60 percent but less than 80 percent impaired, limited or restricted Mobility: Walking and Moving Around Goal Status [phone]): At least 40 percent but less than 60 percent impaired, limited or restricted    Reginia Naas 07/01/2016, 10:32 AM  Magda Kiel, [place] 07/01/2016

## 2016-07-01 NOTE — Progress Notes (Signed)
ANTICOAGULATION CONSULT NOTE - Initial Consult  Pharmacy Consult for Coumadin Indication: h/o recurrent PE  Allergies  Allergen Reactions  . Anesthetics, Halogenated Shortness Of Breath  . Tramadol     Doesn't remember reaction   . Lamotrigine Itching    Patient Measurements: Height: '5\' 5"'  (165.1 cm) Weight: (!) 325 lb (147.4 kg) IBW/kg (Calculated) : 57  Vital Signs: Temp: 98 F (36.7 C) (09/07 0026) Temp Source: Oral (09/07 0026) BP: 145/96 (09/07 0026) Pulse Rate: 98 (09/07 0026)  Labs:  Recent Labs  06/30/16 1515 06/30/16 1708  HGB 16.1* 17.0*  HCT 48.0* 50.0*  PLT 281  --   LABPROT 17.8*  --   INR 1.45  --   CREATININE  --  0.70    Estimated Creatinine Clearance: 90.8 mL/min (by C-G formula based on SCr of 0.8 mg/dL).   Medical History: Past Medical History:  Diagnosis Date  . Abnormal Pap smear of cervix   . Allergic rhinitis, cause unspecified   . Backache, unspecified   . Depression    mood disorder; ? bipolar  . Dermatomycosis, unspecified   . Diabetes mellitus type II   . Edema   . Foot fracture, right   . HLD (hyperlipidemia)   . HTN (hypertension)   . Hx pulmonary embolism    multiple  . Lumbar spinal stenosis   . Malignant neoplasm of corpus uteri, except isthmus ([place])   . Morbid obesity ([place])   . Other specified disease of hair and hair follicles   . Pain in joint, pelvic region and thigh   . Panic disorder without agoraphobia     Medications:  Prescriptions Prior to Admission  Medication Sig Dispense Refill Last Dose  . acetaminophen (TYLENOL) 500 MG tablet Take 1,000 mg by mouth every 6 (six) hours as needed for mild pain, moderate pain, fever or headache.   06/29/2016 at Unknown time  . b complex vitamins tablet Take 1 tablet by mouth daily.   06/30/2016 at Unknown time  . Blood Glucose Monitoring Suppl (ACCU-CHEK AVIVA PLUS) W/DEVICE KIT by Does not apply route.   Unknown at Unknown time  . buPROPion (WELLBUTRIN XL) 150 MG 24 hr  tablet Take 450 mg by mouth daily.    06/30/2016 at Unknown time  . clonazePAM (KLONOPIN) 1 MG tablet Take 1 mg by mouth 3 (three) times daily. For anxiety   06/29/2016 at Unknown time  . ezetimibe-simvastatin (VYTORIN) 10-20 MG tablet Take 1 tablet by mouth daily.   06/29/2016 at Unknown time  . fluticasone (FLONASE) 50 MCG/ACT nasal spray Place 2 sprays into both nostrils daily.   06/29/2016 at Unknown time  . furosemide (LASIX) 20 MG tablet Take 20 mg by mouth daily.   06/30/2016 at Unknown time  . glucose blood (ACCU-CHEK AVIVA PLUS) test strip 1 each by Other route as needed for other. Use as instructed   Unknown at Unknown time  . ibuprofen (ADVIL,MOTRIN) 200 MG tablet Take 400 mg by mouth every 6 (six) hours as needed for fever, headache, mild pain, moderate pain or cramping.   Past Week at Unknown time  . levothyroxine (SYNTHROID, LEVOTHROID) 50 MCG tablet Take 1 tablet (50 mcg total) by mouth daily before breakfast. 30 tablet 0 06/30/2016 at Unknown time  . lisinopril (PRINIVIL,ZESTRIL) 20 MG tablet TAKE 1 TABLET BY MOUTH EVERY DAY 90 tablet 1 06/30/2016 at Unknown time  . Lysine 500 MG TABS Take 1 tablet by mouth daily.   06/30/2016 at Unknown time  .  Multiple Vitamin (MULTIVITAMIN) tablet Take 1 tablet by mouth daily.     06/30/2016 at Unknown time  . naproxen sodium (ANAPROX) 220 MG tablet Take 220-440 mg by mouth 2 (two) times daily as needed (for pain).    06/29/2016 at Unknown time  . neomycin-bacitracin-polymyxin (NEOSPORIN) ointment Apply 1 application topically as needed for wound care.   Past Week at Unknown time  . nystatin (MYCOSTATIN/NYSTOP) 100000 UNIT/GM POWD Sprinkle powder to the affected rash daily 60 g 0 Past Week at Unknown time  . nystatin cream (MYCOSTATIN) Apply 1 application topically 3 (three) times daily as needed for rash.  5   . omeprazole (PRILOSEC) 20 MG capsule TAKE ONE CAPSULE BY MOUTH EVERY DAY *NEEDS OFFICE VISIT* 30 capsule 0 06/30/2016 at Unknown time  . senna (SENOKOT) 8.6 MG  TABS tablet Take 1 tablet by mouth daily as needed for mild constipation.   Past Week at Unknown time  . venlafaxine (EFFEXOR-XR) 150 MG 24 hr capsule Take 300 mg by mouth daily.     06/30/2016 at Unknown time  . warfarin (COUMADIN) 3 MG tablet Take 3 mg by mouth 2 (two) times a week. Takes on Sunday and Saturday   06/27/2016  . warfarin (COUMADIN) 4 MG tablet Take 4 mg by mouth See admin instructions. Takes on Monday thru Friday   06/30/2016 at 0900  . hydrochlorothiazide (HYDRODIURIL) 25 MG tablet TAKE 1 TABLET BY MOUTH EVERY DAY (Patient not taking: Reported on 06/30/2016) 90 tablet 1 Not Taking at Unknown time  . VYTORIN 10-20 MG per tablet TAKE 1 TABLET EVERY DAY (Patient not taking: Reported on 06/30/2016) 90 tablet 2 Not Taking at Unknown time    Assessment: 75 y.o. F presents after falling out of bed. Pt with rib fractures. Pt on coumadin PTA for h/o recurrent PE. Admission INR subtherapeutic (1.45). CBC ok on admission. Home dose: Pt takes 76m Mon to Fri and 383mon Sat and Sun. last taken 9/6 0900  Goal of Therapy:  INR 2-3 Monitor platelets by anticoagulation protocol: Yes   Plan:  Coumadin 59m28mow Daily INR  CarSherlon HandingharmD, BCPS Clinical pharmacist, pager [phone]/2017,12:38 AM

## 2016-07-01 NOTE — Progress Notes (Signed)
[facility] PROGRESS NOTE  Theresa Blake [id number] DOB: 02-07-1941 DOA: 06/30/2016 PCP: Debbora Dus, MD  Interim summary and HPI 75 y.o. female with medical history significant of hypertension, pulmonary hypertension, pulmonary embolism on warfarin, allergic rhinitis, runny back pain, depression, type 2 diabetes, endometrial cancer, hyperlipidemia, morbid obesity was brought to the emergency department after having a fall at home sustaining facial and chest wall injury. Per patient, she has been falling out of bed multiple times recently. She fell again today and hit her left side of face and chest with a dresser. She says that she immediately felt pain on her left lower rib cage, which got worse with breathing and improved when she applied pressure to the area. She denies any recent fever, chills, chest pressures, palpitations, diaphoresis, dizziness, pitting edema lower extremities.  Assessment/Plan: Multiple rib fractures   No abnormalities seen on telemetry; will discontinue tele Continue analgesics as needed. Trauma surgery on board and will follow rec's Continue flutter valve and RT chest toiletry exercises  Case management evaluation for home health needs and services     Hypoxia Secondary to rib fractures related hypoventilation. Continue supplemental oxygen and assess for outpatient oxygen needs Given obesity, she is a high risk for OHS and OSA; will need outpatient sleep study.     Hypothyroidism Continue levothyroxine 50 g by mouth daily.    Diabetes mellitus type 2 in obese (HCC) Modified Carbohydrate diet. CBG monitoring before meals. SSI    Hyperlipidemia Continue Vytorin 10-20 milligrams by mouth daily.    Depression and anxiety Continue Effexor 300 mg by mouth daily. Will also continue klonopin TID; but dose adjusted to 0.5mg  instead of 1mg      Essential hypertension Continue furosemide 20 mg by mouth daily. Continue hydrochlorothiazide 25 mg  by mouth daily. Continue lisinopril 20 mg by mouth daily. Monitor blood pressure periodically.    PULMONARY EMBOLISM, HX OF Continue warfarin per pharmacy dosing.    Obesity Body mass index is 54.08 kg/m. Aggressive Low calorie diet discussed with patient  Code Status: Full Family Communication: son at bedside  Disposition Plan: home in 1 day or so; given the fact patient didn't qualify for inpatient will arrange for [facility] services.    Consultants:  Trauma service   Procedures:  See below for x-ray reports   Antibiotics:  None   HPI/Subjective: Afebrile, complaining of left costal pain. No nausea, no vomiting.  Objective: Vitals:   07/01/16 0624 07/01/16 1415  BP: (!) 141/76 (!) 142/73  Pulse: 79 87  Resp: 18 15  Temp: 97.5 F (36.4 C) 97.5 F (36.4 C)   No intake or output data in the 24 hours ending 07/01/16 1627 Filed Weights   07/01/16 0026  Weight: (!) 147.4 kg (325 lb)    Exam:   General:  Afebrile, complaining of left side costal area pain; no nausea, no vomiting.  Cardiovascular: S1 and S2, no rubs, no gallops  Respiratory: good air movement, no wheezing   Abdomen: obese, no tender, no distension, positive BS  Musculoskeletal: trace edema, no cyanosis   Data Reviewed: Basic Metabolic Panel:  Recent Labs Lab 06/30/16 1708 07/01/16 0434  NA 140 139  K 3.9 3.6  CL 99* 101  CO2  --  30  GLUCOSE 140* 146*  BUN 18 13  CREATININE 0.70 0.69  CALCIUM  --  10.2   Liver Function Tests:  Recent Labs Lab 07/01/16 0434  AST 14*  ALT 19  ALKPHOS 72  BILITOT 0.6  PROT 6.3*  ALBUMIN 3.3*   CBC:  Recent Labs Lab 06/30/16 1515 06/30/16 1708 07/01/16 0434  WBC 8.6  --  8.4  NEUTROABS 6.0  --  6.0  HGB 16.1* 17.0* 15.6*  HCT 48.0* 50.0* 48.6*  MCV 92.1  --  95.3  PLT 281  --  296    CBG:  Recent Labs Lab 06/30/16 2331 07/01/16 0028 07/01/16 0829 07/01/16 1201  GLUCAP 155* 160* 123* 146*    No results found for this or  any previous visit (from the past 240 hour(s)).   Studies: Dg Chest 2 View  Result Date: 07/01/2016 CLINICAL DATA:  [place] from bed.  Rib fractures. EXAM: CHEST  2 VIEW COMPARISON:  06/30/2016 FINDINGS: The heart size is mildly enlarged. No pleural effusion or edema. No airspace consolidation. The bones appear diffusely osteopenic. Anterior left rib fracture deformities are better seen on recent chest CT. No displaced fractures identified. IMPRESSION: 1. No acute cardiopulmonary abnormalities. Electronically Signed   By: Kerby Moors M.D.   On: 07/01/2016 08:21   Ct Chest W Contrast  Result Date: 06/30/2016 CLINICAL DATA:  Fall from bed today, left chest wall pain EXAM: CT CHEST WITH CONTRAST TECHNIQUE: Multidetector CT imaging of the chest was performed during intravenous contrast administration. CONTRAST:  87mL ISOVUE-300 IOPAMIDOL (ISOVUE-300) INJECTION 61% COMPARISON:  06/30/2016 chest radiograph FINDINGS: Despite efforts by the technologist and patient, motion artifact is present on today's exam and could not be eliminated. This reduces exam sensitivity and specificity. Body habitus reduces diagnostic sensitivity and specificity. Cardiovascular: Mild aortic arch and branch vessel atherosclerotic vascular disease. Mediastinum/Nodes: Unremarkable Lungs/Pleura: Mosaic attenuation in the lungs. No significant reduction in vascularity in the darker segments. Slight nodularity in the left upper lobe along the major fissure. Mild atelectasis in the anteromedial segment left lower lobe. Upper Abdomen: Hypodense lesion in segment 4 of the liver, 1.1 cm in diameter on image 122/3, highly ill-defined and nonspecific. Lax anterior abdominal wall partially hangs at outside of the field of view. Musculoskeletal: Nondisplaced fractures the left anterior fifth, sixth, and seventh ribs. Bony demineralization. Sternum intact. Mild upper thoracic kyphosis. Mild thoracic spondylosis. IMPRESSION: 1. Nondisplaced fractures  the left anterior fifth, sixth, and seventh ribs. 2. Nonspecific 1.1 cm hypodense lesion in segment 4 of the liver. Highly indistinct/blurred. 3. Mosaic attenuation in the lungs, with appearance favoring an airspace filling process as a cause, possibly low grade edema or extrinsic allergic alveolitis. 4. Mild atherosclerosis. Electronically Signed   By: Van Clines M.D.   On: 06/30/2016 18:11   Dg Chest Port 1 View  Result Date: 06/30/2016 CLINICAL DATA:  Left-sided chest pain following blunt trauma/fall today, initial encounter EXAM: PORTABLE CHEST 1 VIEW COMPARISON:  09/14/2005 FINDINGS: Cardiac shadow remains enlarged. The lungs are well aerated bilaterally. No pneumothorax is seen. Limited evaluation of the ribcage shows no acute abnormality. No other focal abnormality is seen. IMPRESSION: No definitive rib fracture is seen. No acute abnormality is noted on this limited exam. Electronically Signed   By: Inez Catalina M.D.   On: 06/30/2016 16:30    Scheduled Meds: . acetaminophen  650 mg Oral Q6H  . B-complex with vitamin C  1 tablet Oral Daily  . buPROPion  450 mg Oral Daily  . clonazePAM  0.5 mg Oral TID  . docusate sodium  100 mg Oral BID  . ezetimibe-simvastatin  1 tablet Oral Daily  . fluticasone  2 spray Each Nare Daily  . furosemide  20 mg Oral Daily  .  gabapentin  300 mg Oral TID  . ibuprofen  800 mg Oral TID  . levothyroxine  50 mcg Oral QAC breakfast  . lisinopril  20 mg Oral Daily  . multivitamin with minerals  1 tablet Oral Daily  . pantoprazole  40 mg Oral Daily  . sodium chloride flush  3 mL Intravenous Q12H  . venlafaxine XR  300 mg Oral Q breakfast  . warfarin  4 mg Oral ONCE-1800  . Warfarin - Pharmacist Dosing Inpatient   Does not apply q1800   Continuous Infusions:   Principal Problem:   Broken ribs Active Problems:   Hypothyroidism   Diabetes mellitus type 2 in obese (HCC)   Hyperlipidemia   Depression   Essential hypertension   PULMONARY EMBOLISM, HX  OF   Hypoxia   Multiple rib fractures    Time spent: 30 minutes    Barton Dubois  [facility] Pager [phone]. If 7PM-7AM, please contact night-coverage at [email or web address], password [facility] 07/01/2016, 4:27 PM  LOS: 0 days

## 2016-07-01 NOTE — Progress Notes (Signed)
ANTICOAGULATION CONSULT NOTE - Initial Consult  Pharmacy Consult for Coumadin Indication: h/o recurrent PE  Allergies  Allergen Reactions  . Anesthetics, Halogenated Shortness Of Breath  . Tramadol     Doesn't remember reaction   . Lamotrigine Itching    Patient Measurements: Height: _0  (165.1 cm) Weight: (!) 325 lb (147.4 kg) IBW/kg (Calculated) : 57  Vital Signs: Temp: 97.5 F (36.4 C) (09/07 0624) Temp Source: Oral (09/07 0624) BP: 141/76 (09/07 0624) Pulse Rate: 79 (09/07 0624)  Labs:  Recent Labs  06/30/16 1515 06/30/16 1708 07/01/16 0434  HGB 16.1* 17.0* 15.6*  HCT 48.0* 50.0* 48.6*  PLT 281  --  296  LABPROT 17.8*  --  16.7*  INR 1.45  --  1.34  CREATININE  --  0.70 0.69    Estimated Creatinine Clearance: 90.8 mL/min (by C-G formula based on SCr of 0.8 mg/dL).   Medical History: Past Medical History:  Diagnosis Date  . Abnormal Pap smear of cervix   . Allergic rhinitis, cause unspecified   . Backache, unspecified   . Depression    mood disorder; ? bipolar  . Dermatomycosis, unspecified   . Diabetes mellitus type II   . Edema   . Foot fracture, right   . HLD (hyperlipidemia)   . HTN (hypertension)   . Hx pulmonary embolism    multiple  . Lumbar spinal stenosis   . Malignant neoplasm of corpus uteri, except isthmus ([place])   . Morbid obesity ([place])   . Other specified disease of hair and hair follicles   . Pain in joint, pelvic region and thigh   . Panic disorder without agoraphobia     Medications:  Prescriptions Prior to Admission  Medication Sig Dispense Refill Last Dose  . acetaminophen (TYLENOL) 500 MG tablet Take 1,000 mg by mouth every 6 (six) hours as needed for mild pain, moderate pain, fever or headache.   06/29/2016 at Unknown time  . b complex vitamins tablet Take 1 tablet by mouth daily.   06/30/2016 at Unknown time  . Blood Glucose Monitoring Suppl (ACCU-CHEK AVIVA PLUS) W/DEVICE KIT by Does not apply route.   Unknown at Unknown  time  . buPROPion (WELLBUTRIN XL) 150 MG 24 hr tablet Take 450 mg by mouth daily.    06/30/2016 at Unknown time  . clonazePAM (KLONOPIN) 1 MG tablet Take 1 mg by mouth 3 (three) times daily. For anxiety   06/29/2016 at Unknown time  . ezetimibe-simvastatin (VYTORIN) 10-20 MG tablet Take 1 tablet by mouth daily.   06/29/2016 at Unknown time  . fluticasone (FLONASE) 50 MCG/ACT nasal spray Place 2 sprays into both nostrils daily.   06/29/2016 at Unknown time  . furosemide (LASIX) 20 MG tablet Take 20 mg by mouth daily.   06/30/2016 at Unknown time  . glucose blood (ACCU-CHEK AVIVA PLUS) test strip 1 each by Other route as needed for other. Use as instructed   Unknown at Unknown time  . ibuprofen (ADVIL,MOTRIN) 200 MG tablet Take 400 mg by mouth every 6 (six) hours as needed for fever, headache, mild pain, moderate pain or cramping.   Past Week at Unknown time  . levothyroxine (SYNTHROID, LEVOTHROID) 50 MCG tablet Take 1 tablet (50 mcg total) by mouth daily before breakfast. 30 tablet 0 06/30/2016 at Unknown time  . lisinopril (PRINIVIL,ZESTRIL) 20 MG tablet TAKE 1 TABLET BY MOUTH EVERY DAY 90 tablet 1 06/30/2016 at Unknown time  . Lysine 500 MG TABS Take 1 tablet by mouth daily.  06/30/2016 at Unknown time  . Multiple Vitamin (MULTIVITAMIN) tablet Take 1 tablet by mouth daily.     06/30/2016 at Unknown time  . naproxen sodium (ANAPROX) 220 MG tablet Take 220-440 mg by mouth 2 (two) times daily as needed (for pain).    06/29/2016 at Unknown time  . neomycin-bacitracin-polymyxin (NEOSPORIN) ointment Apply 1 application topically as needed for wound care.   Past Week at Unknown time  . nystatin (MYCOSTATIN/NYSTOP) 100000 UNIT/GM POWD Sprinkle powder to the affected rash daily 60 g 0 Past Week at Unknown time  . nystatin cream (MYCOSTATIN) Apply 1 application topically 3 (three) times daily as needed for rash.  5   . omeprazole (PRILOSEC) 20 MG capsule TAKE ONE CAPSULE BY MOUTH EVERY DAY *NEEDS OFFICE VISIT* 30 capsule 0  06/30/2016 at Unknown time  . senna (SENOKOT) 8.6 MG TABS tablet Take 1 tablet by mouth daily as needed for mild constipation.   Past Week at Unknown time  . venlafaxine (EFFEXOR-XR) 150 MG 24 hr capsule Take 300 mg by mouth daily.     06/30/2016 at Unknown time  . warfarin (COUMADIN) 3 MG tablet Take 3 mg by mouth 2 (two) times a week. Takes on Sunday and Saturday   06/27/2016  . warfarin (COUMADIN) 4 MG tablet Take 4 mg by mouth See admin instructions. Takes on Monday thru Friday   06/30/2016 at 0900  . hydrochlorothiazide (HYDRODIURIL) 25 MG tablet TAKE 1 TABLET BY MOUTH EVERY DAY (Patient not taking: Reported on 06/30/2016) 90 tablet 1 Not Taking at Unknown time  . VYTORIN 10-20 MG per tablet TAKE 1 TABLET EVERY DAY (Patient not taking: Reported on 06/30/2016) 90 tablet 2 Not Taking at Unknown time    Assessment: 75 y.o. F presents after falling out of bed. Pt with rib fractures. Pt on Coumadin 37m daily exc for 332mon Sat and Sun PTA for h/o recurrent PE. Admission INR subtherapeutic at 1.45. INR down to 1.34 this am. CBC stable, no s/s of bleed.  Goal of Therapy:  INR 2-3 Monitor platelets by anticoagulation protocol: Yes   Plan:  Give Coumadin 64m62mO x 1 tonight Monitor daily INR, CBC, s/s of bleed  NatElenor QuinonesharmD, BCPS Clinical Pharmacist Pager [phone] 11:05 AM

## 2016-07-01 NOTE — Care Management Note (Addendum)
Case Management Note  Patient Details  Name: Theresa Blake MRN: [id number] Date of Birth: 01/15/1941  Subjective/Objective:                 Patient from home with son Cyani Space in [place], [place]. Patient admitted with fall and hit head on table, on coumadin, sustained broken ribs, . Tx'd from [facility]. Patient states that he provides 24 hour supervision and care for her. Patient states that she is for the most part bedridden. She states she cannot ambulate and uses a WC when OOB. She states that she cannot stand in the shower, and cannot step over the tub so she has her son help her with bed baths. She states that she has tried a hospital bed in the past and did not want another. Patient is not active with [facility]. CSW consulted to eval for qualifying stay for SNF placement. At this time, patient is observation and does not have qualifying stay. CM LM with son [name] to discuss home needs for discharge planning.  Pharmacy [facility] PCP Dr Miles Costain [facility], [place])  Action/Plan:  Watch for O2 needs, assist with [facility] planning.   Expected Discharge Date:                  Expected Discharge Plan:  [place]  In-House Referral:  Clinical Social Work  Discharge planning Services  CM Consult  Post Acute Care Choice:  Home Health Choice offered to:  Patient, Adult Children  DME Arranged:    DME Agency:     HH Arranged:    [place] Agency:     Status of Service:  In process, will continue to follow  If discussed at Long Length of Stay Meetings, dates discussed:    Additional Comments:  Carles Collet, RN 07/01/2016, 12:13 PM

## 2016-07-01 NOTE — Progress Notes (Signed)
PHARMACIST - PHYSICIAN ORDER COMMUNICATION  CONCERNING: P&T Medication Policy on Herbal Medications  DESCRIPTION:  This patient's order for: Lysine  has been noted.  This product(s) is classified as an "herbal" or natural product. Due to a lack of definitive safety studies or FDA approval, nonstandard manufacturing practices, plus the potential risk of unknown drug-drug interactions while on inpatient medications, the Pharmacy and Therapeutics Committee does not permit the use of "herbal" or natural products of this type within [facility].   ACTION TAKEN: The pharmacy department is unable to verify this order at this time and your patient has been informed of this safety policy. Please reevaluate patient's clinical condition at discharge and address if the herbal or natural product(s) should be resumed at that time.  

## 2016-07-01 NOTE — Consult Note (Addendum)
Surgical Consultation Requesting physician- Dr. Tennis Must  CC: Fall, rib fractures  HPI: Theresa Blake is a 75yo woman with multiple comorbidities including HTN, pulmonary HTN, PE on warfarin, depression, type 2 diabetes, endometrial cancer and morbid obesity who presented to the ER at [facility] approximately 12 hours ago with complaints of left chest wall pain after a fall. She has been falling out of bed multiple times recently (she is bedbound for the most part) and yesterday when she fell she made contact with a wooden table to the side of her face and left chest. No loss of consciousness, rather awoke from the fall. She immediately experienced left sided rib pain worsened with deep breaths. She was evaluated in the ED with a CT chest which reveals left anterior rib 5,6,7 fractures. Her O2 sats in the ER were reportedly 86% on room air and 91% on 2L [place], with some improvement with pain medication. In light of her numerous and severe comorbidities and her poor oxygenation, the decision was made there to transfer her to [facility] to be cared for by the hospitalist and to have a trauma consult.  Currently she denies headache, vision change, neck pain, back pain, abdominal pain or new extremity pain. Solely complain of localized chest wall pain.    Allergies  Allergen Reactions  . Anesthetics, Halogenated Shortness Of Breath  . Tramadol     Doesn't remember reaction   . Lamotrigine Itching    Past Medical History:  Diagnosis Date  . Abnormal Pap smear of cervix   . Allergic rhinitis, cause unspecified   . Backache, unspecified   . Depression    mood disorder; ? bipolar  . Dermatomycosis, unspecified   . Diabetes mellitus type II   . Edema   . Foot fracture, right   . HLD (hyperlipidemia)   . HTN (hypertension)   . Hx pulmonary embolism    multiple  . Lumbar spinal stenosis   . Malignant neoplasm of corpus uteri, except isthmus ([place])   . Morbid obesity ([place])   . Other  specified disease of hair and hair follicles   . Pain in joint, pelvic region and thigh   . Panic disorder without agoraphobia     Past Surgical History:  Procedure Laterality Date  . BREAST BIOPSY     Right-benign  . ENDOMETRIAL BIOPSY  08/2006   attempted  . TONSILLECTOMY    . TUBAL LIGATION      Family History  Problem Relation Age of Onset  . Heart attack Father 25  . Pancreatic cancer Mother     with mets  . Hyperlipidemia Son   . Hyperlipidemia Son   . Gout Son     Social History   Social History  . Marital status: Widowed    Spouse name: N/A  . Number of children: 4  . Years of education: N/A   Occupational History  . Disabled    Social History Main Topics  . Smoking status: Never Smoker  . Smokeless tobacco: Never Used  . Alcohol use No  . Drug use: No  . Sexual activity: Not Asked   Other Topics Concern  . None   Social History Narrative   Widow-husband died with MI, DM      4 sons      Disability      Compulsive overeater             No current facility-administered medications on file prior to encounter.  Current Outpatient Prescriptions on File Prior to Encounter  Medication Sig Dispense Refill  . Blood Glucose Monitoring Suppl (ACCU-CHEK AVIVA PLUS) W/DEVICE KIT by Does not apply route.    Marland Kitchen buPROPion (WELLBUTRIN XL) 150 MG 24 hr tablet Take 450 mg by mouth daily.     . clonazePAM (KLONOPIN) 1 MG tablet Take 1 mg by mouth 3 (three) times daily. For anxiety    . glucose blood (ACCU-CHEK AVIVA PLUS) test strip 1 each by Other route as needed for other. Use as instructed    . levothyroxine (SYNTHROID, LEVOTHROID) 50 MCG tablet Take 1 tablet (50 mcg total) by mouth daily before breakfast. 30 tablet 0  . lisinopril (PRINIVIL,ZESTRIL) 20 MG tablet TAKE 1 TABLET BY MOUTH EVERY DAY 90 tablet 1  . Multiple Vitamin (MULTIVITAMIN) tablet Take 1 tablet by mouth daily.      . naproxen sodium (ANAPROX) 220 MG tablet Take 220-440 mg by mouth 2  (two) times daily as needed (for pain).     . nystatin (MYCOSTATIN/NYSTOP) 100000 UNIT/GM POWD Sprinkle powder to the affected rash daily 60 g 0  . omeprazole (PRILOSEC) 20 MG capsule TAKE ONE CAPSULE BY MOUTH EVERY DAY *NEEDS OFFICE VISIT* 30 capsule 0  . venlafaxine (EFFEXOR-XR) 150 MG 24 hr capsule Take 300 mg by mouth daily.      . hydrochlorothiazide (HYDRODIURIL) 25 MG tablet TAKE 1 TABLET BY MOUTH EVERY DAY (Patient not taking: Reported on 06/30/2016) 90 tablet 1  . VYTORIN 10-20 MG per tablet TAKE 1 TABLET EVERY DAY (Patient not taking: Reported on 06/30/2016) 90 tablet 2    Review of Systems: a complete, 10pt review of systems was completed with pertinent positives and negatives as documented in the HPI.   Physical Exam: Vitals:   06/30/16 2341 07/01/16 0026  BP: 126/66 (!) 145/96  Pulse: 108 98  Resp: 22 20  Temp:  98 F (36.7 C)   CBC    Component Value Date/Time   WBC 8.6 06/30/2016 1515   RBC 5.21 (H) 06/30/2016 1515   HGB 17.0 (H) 06/30/2016 1708   HCT 50.0 (H) 06/30/2016 1708   PLT 281 06/30/2016 1515   MCV 92.1 06/30/2016 1515   MCH 30.9 06/30/2016 1515   MCHC 33.5 06/30/2016 1515   RDW 13.9 06/30/2016 1515   LYMPHSABS 1.5 06/30/2016 1515   MONOABS 1.0 06/30/2016 1515   EOSABS 0.2 06/30/2016 1515   BASOSABS 0.0 06/30/2016 1515    BMP Latest Ref Rng & Units 06/30/2016 03/06/2013 04/08/2012  Glucose 65 - 99 mg/dL 140(H) 185(H) 133(H)  BUN 6 - 20 mg/dL _0 Creatinine 0.44 - 1.00 mg/dL 0.70 0.9 0.72  Sodium 135 - 145 mmol/L 140 135 138  Potassium 3.5 - 5.1 mmol/L 3.9 4.0 3.7  Chloride 101 - 111 mmol/L 99(L) 102 105  CO2 19 - 32 mEq/L - 25 24  Calcium 8.4 - 10.5 mg/dL - 10.1 9.8   INR 1.45 Gen: A&Ox3, no distress, obese and chronically ill appearing.  H&N: normocephalic, atraumatic, EOMI. Neck supple without mass or thyromegaly. Small ecchymosis to left lower lip. No facial tenderness or malocclusion.  Chest: unlabored respirations, CTAB, tender in region of  known rib fx. RRR with palpable distal pulses in bilateral radials Abdomen: Obese, nontender Extremities: warm, bilateral non-pitting edema, no deformities or ecchymosis. Bilateral lower legs are mildly tender but she is able to move them without pain.  Neuro: grossly intact Psych: appropriate mood and affect, tangential thought process  Imaging: Study  Result   CLINICAL DATA:  Fall from bed today, left chest wall pain  EXAM: CT CHEST WITH CONTRAST  TECHNIQUE: Multidetector CT imaging of the chest was performed during intravenous contrast administration.  CONTRAST:  78m ISOVUE-300 IOPAMIDOL (ISOVUE-300) INJECTION 61%  COMPARISON:  06/30/2016 chest radiograph  FINDINGS: Despite efforts by the technologist and patient, motion artifact is present on today's exam and could not be eliminated. This reduces exam sensitivity and specificity. Body habitus reduces diagnostic sensitivity and specificity.  Cardiovascular: Mild aortic arch and branch vessel atherosclerotic vascular disease.  Mediastinum/Nodes: Unremarkable  Lungs/Pleura: Mosaic attenuation in the lungs. No significant reduction in vascularity in the darker segments. Slight nodularity in the left upper lobe along the major fissure. Mild atelectasis in the anteromedial segment left lower lobe.  Upper Abdomen: Hypodense lesion in segment 4 of the liver, 1.1 cm in diameter on image 122/3, highly ill-defined and nonspecific. Lax anterior abdominal wall partially hangs at outside of the field of view.  Musculoskeletal: Nondisplaced fractures the left anterior fifth, sixth, and seventh ribs. Bony demineralization. Sternum intact. Mild upper thoracic kyphosis. Mild thoracic spondylosis.  IMPRESSION: 1. Nondisplaced fractures the left anterior fifth, sixth, and seventh ribs. 2. Nonspecific 1.1 cm hypodense lesion in segment 4 of the liver. Highly indistinct/blurred. 3. Mosaic attenuation in the lungs, with  appearance favoring an airspace filling process as a cause, possibly low grade edema or extrinsic allergic alveolitis. 4. Mild atherosclerosis.   Electronically Signed   By: WVan ClinesM.D.   On: 06/30/2016 18:11     A/P: 89yowoman with recurrent falls, now with rib fx. Given the duration since her fall and her general stability/ benign exam, unlikely that she has sustained significant other injuries. Would recommend a low threshold to CT her head and neck if she develops altered mental status or complains of pain.  -Rib fractures: Repeat CXR later this morning. Multi-modal pain control with tylenol, NSAIDS, neurontin, robaxin. Aggressive pulmonary toilet with hourly IS and Q6h exercises with RT -Agree with referral for sleep study and case management for home safety -May benefit from PT/OT evaluation and continued outpatient therapy for general deconditioning -Consider weaning klonopin as outpatient  Trauma will continue to follow. Thank you for involving [place] her care.     CRomana Juniper MD COrlando Orthopa[facility] Out[facility] LLCSurgery, PUtahPager [phone]

## 2016-07-02 DIAGNOSIS — S2242XA Multiple fractures of ribs, left side, initial encounter for closed fracture: Secondary | ICD-10-CM

## 2016-07-02 DIAGNOSIS — F329 Major depressive disorder, single episode, unspecified: Secondary | ICD-10-CM | POA: Diagnosis not present

## 2016-07-02 DIAGNOSIS — Z8542 Personal history of malignant neoplasm of other parts of uterus: Secondary | ICD-10-CM

## 2016-07-02 DIAGNOSIS — J9601 Acute respiratory failure with hypoxia: Secondary | ICD-10-CM

## 2016-07-02 DIAGNOSIS — I10 Essential (primary) hypertension: Secondary | ICD-10-CM | POA: Diagnosis not present

## 2016-07-02 DIAGNOSIS — J9621 Acute and chronic respiratory failure with hypoxia: Secondary | ICD-10-CM

## 2016-07-02 DIAGNOSIS — E119 Type 2 diabetes mellitus without complications: Secondary | ICD-10-CM | POA: Diagnosis not present

## 2016-07-02 LAB — GLUCOSE, CAPILLARY
GLUCOSE-CAPILLARY: 141 mg/dL — AB (ref 65–99)
GLUCOSE-CAPILLARY: 241 mg/dL — AB (ref 65–99)
GLUCOSE-CAPILLARY: 155 mg/dL — AB (ref 65–99)
Glucose-Capillary: 106 mg/dL — ABNORMAL HIGH (ref 65–99)

## 2016-07-02 LAB — HEMOGLOBIN A1C
Hgb A1c MFr Bld: 7 % — ABNORMAL HIGH (ref 4.8–5.6)
Mean Plasma Glucose: 154 mg/dL

## 2016-07-02 LAB — BASIC METABOLIC PANEL
Anion gap: 7 (ref 5–15)
BUN: 13 mg/dL (ref 6–20)
CHLORIDE: 96 mmol/L — AB (ref 101–111)
CO2: 30 mmol/L (ref 22–32)
CREATININE: 0.74 mg/dL (ref 0.44–1.00)
Calcium: 10.3 mg/dL (ref 8.9–10.3)
GFR calc non Af Amer: >60 mL/min (ref >60)
Glucose, Bld: 117 mg/dL — ABNORMAL HIGH (ref 65–99)
POTASSIUM: 3.7 mmol/L (ref 3.5–5.1)
Sodium: 133 mmol/L — ABNORMAL LOW (ref 135–145)

## 2016-07-02 LAB — PROTIME-INR
INR: 1.41
Prothrombin Time: 17.3 seconds — ABNORMAL HIGH (ref 11.4–15.2)

## 2016-07-02 LAB — CBC
HEMATOCRIT: 46.2 % — AB (ref 36.0–46.0)
HEMOGLOBIN: 14.9 g/dL (ref 12.0–15.0)
MCH: 30.7 pg (ref 26.0–34.0)
MCHC: 32.3 g/dL (ref 30.0–36.0)
MCV: 95.1 fL (ref 78.0–100.0)
Platelets: 287 10*3/uL (ref 150–400)
RBC: 4.86 MIL/uL (ref 3.87–5.11)
RDW: 14 % (ref 11.5–15.5)
WBC: 7.5 10*3/uL (ref 4.0–10.5)

## 2016-07-02 MED ORDER — GABAPENTIN 300 MG PO CAPS: 300.0000 mg | ORAL_CAPSULE | Freq: Three times a day (TID) | ORAL | 0 refills | Status: DC

## 2016-07-02 MED ORDER — METHOCARBAMOL 500 MG PO TABS: 500.0000 mg | ORAL_TABLET | Freq: Three times a day (TID) | ORAL | 0 refills | Status: DC | PRN

## 2016-07-02 MED ORDER — WARFARIN SODIUM 4 MG PO TABS
4.0000 mg | ORAL_TABLET | Freq: Once | ORAL | Status: AC
Start: 2016-07-02 — End: 2016-07-02
  Administered 2016-07-02: 4 mg via ORAL
  Filled 2016-07-02: qty 1

## 2016-07-02 MED ORDER — IBUPROFEN 800 MG PO TABS: 800.0000 mg | ORAL_TABLET | Freq: Three times a day (TID) | ORAL | 0 refills | Status: DC | PRN

## 2016-07-02 NOTE — Progress Notes (Signed)
Patient ID: Theresa Blake, female   DOB: [birth year], 75 y.o.   MRN: [id number]   LOS: 0 days   Subjective: No unexpected c/o.   Objective: Vital signs in last 24 hours: Temp:  [97.5 F (36.4 C)-98.2 F (36.8 C)] 98.2 F (36.8 C) (09/07 2124) Pulse Rate:  [86-87] 86 (09/07 2124) Resp:  [15-20] 20 (09/07 2124) BP: (135-142)/(69-73) 135/69 (09/07 2124) SpO2:  [93 %-97 %] 93 % (09/07 2124) Last BM Date: 06/30/16   IS: 760ml   Laboratory  CBC  Recent Labs  07/01/16 0434 07/02/16 0618  WBC 8.4 7.5  HGB 15.6* 14.9  HCT 48.6* 46.2*  PLT 296 287   BMET  Recent Labs  07/01/16 0434 07/02/16 0618  NA 139 133*  K 3.6 3.7  CL 101 96*  CO2 30 30  GLUCOSE 146* 117*  BUN 13 13  CREATININE 0.69 0.74  CALCIUM 10.2 10.3    Physical Exam General appearance: alert and no distress Resp: clear to auscultation bilaterally   Assessment/Plan: Fall Multiple rib fxs -- Pulmonary toilet. Encourage IS. Agree with PT/OT. Would encourage pain treatment with non-narcotic meds in this age range (though she hasn't taken any). Trauma will sign off, please call with questions. Multiple medical problems -- per primary service    Lisette Abu, PA-C Pager: [phone] General Trauma PA Pager: [phone]  07/02/2016

## 2016-07-02 NOTE — Progress Notes (Signed)
Physical Therapy Treatment Patient Details Name: Theresa Blake MRN: [id number] DOB: Dec 15, 1940 Today's Date: 07/02/2016    History of Present Illness Theresa Blake is a 75yo woman with multiple comorbidities including SS, HTN, pulmonary HTN, PE on warfarin, depression, type 2 diabetes, endometrial cancer and morbid obesity who presented with complaints of left chest wall pain after a fall out of bed.  Found to have rib fx 5-7 and facial injury.      PT Comments    Patient with improved safety with bed deflated.  Able to stand x 2, but with limited tolerance and still very high fall risk.  Agree with home with HHPT, aide, etc.    Follow Up Recommendations  Home health [facility] bed (bariatric)    Recommendations for Other Services       Precautions / Restrictions Precautions Precautions: Fall Precaution Comments: falls from bed to L side    Mobility  Bed Mobility Overal bed mobility: Needs Assistance;+2 for physical assistance Bed Mobility: Rolling;Sidelying to Sit Rolling: Mod assist Sidelying to sit: Mod assist;+2 for physical assistance   Sit to supine: Mod assist;+2 for physical assistance   General bed mobility comments: used pad to assist pt to roll, cues to reach for rail, patient assisted with bringing trunk upright, but needed guarding assist at knees to prevent sliding forward and bed deflated for safety  Transfers Overall transfer level: Needs assistance Equipment used: Rolling walker (2 wheeled) Transfers: Sit to/from Stand Sit to Stand: +2 physical assistance;Mod assist;Max assist         General transfer comment: stood twice from bed using walker and assist to lift with pad, noted audible crepitus in knees esp with stand to sit  Ambulation/Gait                 Stairs            Wheelchair Mobility    Modified Rankin (Stroke Patients Only)       Balance           Standing balance support:  Bilateral upper extremity supported Standing balance-Leahy Scale: Poor Standing balance comment: min x 2 for safety with UE support on walker, stood about 10-15 seconds x 2                    Cognition Arousal/Alertness: Awake/alert Behavior During Therapy: WFL for tasks assessed/performed Overall Cognitive Status: Within Functional Limits for tasks assessed                      Exercises General Exercises - Lower Extremity Heel Slides: Strengthening;Both;5 reps;Other (comment) (with resisted extension) Other Exercises Other Exercises: used incentive spirometer x 10 with cues for technique, able to get consistently to 786ml, once up to 108ml    General Comments        Pertinent Vitals/Pain Pain Assessment: Faces Faces Pain Scale: Hurts whole lot Pain Location: L ribs with movement Pain Descriptors / Indicators: Guarding;Grimacing;Sore Pain Intervention(s): Limited activity within patient's tolerance;Monitored during session;Repositioned (used pillow against side to splint)    Home Living                      Prior Function            PT Goals (current goals can now be found in the care plan section) Progress towards PT goals: Progressing toward goals    Frequency  Min 3X/week  PT Plan Discharge plan needs to be updated    Co-evaluation             End of Session   Activity Tolerance: Patient limited by pain Patient left: in bed;with call bell/phone within reach;with bed alarm set     Time: 1415-1437 (then [id number]) PT Time Calculation (min) (ACUTE ONLY): 22 min  Charges:  $Therapeutic Exercise: 8-22 mins $Therapeutic Activity: 23-37 mins                    G CodesReginia Naas 07-31-16, 4:24 PM  Magda Kiel, [place] Jul 31, 2016

## 2016-07-02 NOTE — Progress Notes (Signed)
SATURATION QUALIFICATIONS: (This note is used to comply with regulatory documentation for home oxygen)  Patient Saturations on Room Air at Rest = 83%  Patient Saturations on 2 Liters of oxygen=95 %  Please briefly explain why patient needs home oxygen: Pt desats <88% and desats with any activity at this time  MD & CSW notified. Leanne Chang, RN

## 2016-07-02 NOTE — Progress Notes (Signed)
[place] for Warfarin Indication: h/o recurrent PE  Allergies  Allergen Reactions  . Anesthetics, Halogenated Shortness Of Breath  . Tramadol     Doesn't remember reaction   . Lamotrigine Itching    Patient Measurements: Height: _0  (165.1 cm) Weight: (!) 325 lb (147.4 kg) IBW/kg (Calculated) : 57  Vital Signs: Temp: 98.2 F (36.8 C) (09/07 2124) BP: 135/69 (09/07 2124) Pulse Rate: 86 (09/07 2124)  Labs:  Recent Labs  06/30/16 1515 06/30/16 1708 07/01/16 0434 07/02/16 0618  HGB 16.1* 17.0* 15.6* 14.9  HCT 48.0* 50.0* 48.6* 46.2*  PLT 281  --  296 287  LABPROT 17.8*  --  16.7* 17.3*  INR 1.45  --  1.34 1.41  CREATININE  --  0.70 0.69 0.74    Estimated Creatinine Clearance: 90.8 mL/min (by C-G formula based on SCr of 0.8 mg/dL).   Medical History: Past Medical History:  Diagnosis Date  . Abnormal Pap smear of cervix   . Allergic rhinitis, cause unspecified   . Backache, unspecified   . Depression    mood disorder; ? bipolar  . Dermatomycosis, unspecified   . Diabetes mellitus type II   . Edema   . Foot fracture, right   . HLD (hyperlipidemia)   . HTN (hypertension)   . Hx pulmonary embolism    multiple  . Lumbar spinal stenosis   . Malignant neoplasm of corpus uteri, except isthmus ([place])   . Morbid obesity ([place])   . Other specified disease of hair and hair follicles   . Pain in joint, pelvic region and thigh   . Panic disorder without agoraphobia     Medications:  Prescriptions Prior to Admission  Medication Sig Dispense Refill Last Dose  . acetaminophen (TYLENOL) 500 MG tablet Take 1,000 mg by mouth every 6 (six) hours as needed for mild pain, moderate pain, fever or headache.   06/29/2016 at Unknown time  . b complex vitamins tablet Take 1 tablet by mouth daily.   06/30/2016 at Unknown time  . Blood Glucose Monitoring Suppl (ACCU-CHEK AVIVA PLUS) W/DEVICE KIT by Does not apply route.   Unknown at Unknown time   . buPROPion (WELLBUTRIN XL) 150 MG 24 hr tablet Take 450 mg by mouth daily.    06/30/2016 at Unknown time  . clonazePAM (KLONOPIN) 1 MG tablet Take 1 mg by mouth 3 (three) times daily. For anxiety   06/29/2016 at Unknown time  . ezetimibe-simvastatin (VYTORIN) 10-20 MG tablet Take 1 tablet by mouth daily.   06/29/2016 at Unknown time  . fluticasone (FLONASE) 50 MCG/ACT nasal spray Place 2 sprays into both nostrils daily.   06/29/2016 at Unknown time  . furosemide (LASIX) 20 MG tablet Take 20 mg by mouth daily.   06/30/2016 at Unknown time  . glucose blood (ACCU-CHEK AVIVA PLUS) test strip 1 each by Other route as needed for other. Use as instructed   Unknown at Unknown time  . ibuprofen (ADVIL,MOTRIN) 200 MG tablet Take 400 mg by mouth every 6 (six) hours as needed for fever, headache, mild pain, moderate pain or cramping.   Past Week at Unknown time  . levothyroxine (SYNTHROID, LEVOTHROID) 50 MCG tablet Take 1 tablet (50 mcg total) by mouth daily before breakfast. 30 tablet 0 06/30/2016 at Unknown time  . lisinopril (PRINIVIL,ZESTRIL) 20 MG tablet TAKE 1 TABLET BY MOUTH EVERY DAY 90 tablet 1 06/30/2016 at Unknown time  . Lysine 500 MG TABS Take 1 tablet by mouth daily.  06/30/2016 at Unknown time  . Multiple Vitamin (MULTIVITAMIN) tablet Take 1 tablet by mouth daily.     06/30/2016 at Unknown time  . naproxen sodium (ANAPROX) 220 MG tablet Take 220-440 mg by mouth 2 (two) times daily as needed (for pain).    06/29/2016 at Unknown time  . neomycin-bacitracin-polymyxin (NEOSPORIN) ointment Apply 1 application topically as needed for wound care.   Past Week at Unknown time  . nystatin (MYCOSTATIN/NYSTOP) 100000 UNIT/GM POWD Sprinkle powder to the affected rash daily 60 g 0 Past Week at Unknown time  . nystatin cream (MYCOSTATIN) Apply 1 application topically 3 (three) times daily as needed for rash.  5   . omeprazole (PRILOSEC) 20 MG capsule TAKE ONE CAPSULE BY MOUTH EVERY DAY *NEEDS OFFICE VISIT* 30 capsule 0 06/30/2016  at Unknown time  . senna (SENOKOT) 8.6 MG TABS tablet Take 1 tablet by mouth daily as needed for mild constipation.   Past Week at Unknown time  . venlafaxine (EFFEXOR-XR) 150 MG 24 hr capsule Take 300 mg by mouth daily.     06/30/2016 at Unknown time  . warfarin (COUMADIN) 3 MG tablet Take 3 mg by mouth 2 (two) times a week. Takes on Sunday and Saturday   06/27/2016  . warfarin (COUMADIN) 4 MG tablet Take 4 mg by mouth See admin instructions. Takes on Monday thru Friday   06/30/2016 at 0900  . hydrochlorothiazide (HYDRODIURIL) 25 MG tablet TAKE 1 TABLET BY MOUTH EVERY DAY (Patient not taking: Reported on 06/30/2016) 90 tablet 1 Not Taking at Unknown time  . VYTORIN 10-20 MG per tablet TAKE 1 TABLET EVERY DAY (Patient not taking: Reported on 06/30/2016) 90 tablet 2 Not Taking at Unknown time    Assessment: 75 y.o. F presents after falling out of bed. Pt with rib fractures. Pt on warfarin 63m daily exc for 3353mon Sat and Sun PTA for h/o recurrent PE. Admission INR subtherapeutic at 1.45. INR trending back up this am to 1.41. CBC stable, no s/s of bleed. PO intake looks low  Goal of Therapy:  INR 2-3 Monitor platelets by anticoagulation protocol: Yes   Plan:  Give warfarin 53m56mO x 1 tonight Monitor daily INR, CBC, s/s of bleed  Thank you for allowing us [place] participate in this patients care. TonJens SomharmD Pager: [phone] 9:10 AM

## 2016-07-02 NOTE — Care Management Note (Signed)
Case Management Note  Patient Details  Name: Theresa Blake MRN: [id number] Date of Birth: 09/23/1941   Subjective/Objective:                 Patient from home with son Jalila Goosby in [place], [place]. Patient admitted with fall and hit head on table, on coumadin, sustained broken ribs, . [facility]'d from [facility]. Patient states that he provides 24 hour supervision and care for her. Patient states that she is for the most part bedridden. She states she cannot ambulate and uses a WC when OOB. She states that she cannot stand in the shower, and cannot step over the tub so she has her son help her with bed baths. She states that she has tried a hospital bed in the past and did not want another. Patient is not active with [facility]. CSW consulted to eval for qualifying stay for SNF placement. At this time, patient is observation and does not have qualifying stay. CM LM with son [name] to discuss home needs for discharge planning.  Pharmacy [facility] PCP Dr Miles Costain [facility], [place])  Action/Plan:  DC to home (pt refused SNF) with [facility] through [facility] per patient's choice. Patient will have hospital bed delivered to house, and [facility] for transport.        Expected Discharge Date:                  Expected Discharge Plan:  [place]  In-House Referral:  Clinical Social Work  Discharge planning Services  CM Consult  Post Acute Care Choice:  Home Health Choice offered to:  Patient, Adult Children  DME Arranged:  Hospital bed DME Agency:  [place]:  RN, PT, OT, Nurse's Aide, Social Work [employer] Agency:  [place]  Status of Service:  Completed, signed off  If discussed at [employer], dates discussed:    Additional Comments:  Carles Collet, RN 07/02/2016, 12:16 PM

## 2016-07-02 NOTE — Discharge Summary (Signed)
Physician Discharge Summary  Theresa Blake [id number] DOB: [birth year] DOA: 06/30/2016  PCP: Debbora Dus, MD  Admit date: 06/30/2016 Discharge date: 07/02/2016  Time spent: 35 minutes  Recommendations for Outpatient Follow-up:  1. Repeat BMET to follow electrolytes and renal function  2. Follow pain and adjust analgesic regimen as needed    Discharge Diagnoses:  Principal Problem:   Broken ribs Active Problems:   Hypothyroidism   Diabetes mellitus type 2 in obese (HCC)   Hyperlipidemia   Depression   Essential hypertension   PULMONARY EMBOLISM, HX OF   Hypoxia   Multiple rib fractures   History of endometrial cancer   Acute respiratory failure with hypoxia [facility])   Discharge Condition: stable and improved. Will discharge home with [facility] services and instructions to follow up with PCP in 10 days.  Diet recommendation: low calorie diet and heart healthy/modified carbohydrates diet   Filed Weights   07/01/16 0026  Weight: (!) 147.4 kg (325 lb)    History of present illness:  75 y.o.femalewith medical history significant of hypertension, pulmonary hypertension, pulmonary embolism on warfarin, allergic rhinitis, runny back pain, depression, type 2 diabetes, endometrial cancer, hyperlipidemia, morbid obesity was brought to the emergency department after having a fall at home sustaining facial and chest wall injury. Per patient, she has been falling out of bed multiple times recently. She fell again today and hit her left side of face and chest with a dresser. She says that she immediately felt pain on her left lower rib cage, which got worse with breathing and improved when she appliedpressure to the area. She denies any recent fever, chills, chest pressures, palpitations, diaphoresis, dizziness, pitting edema lower extremities.  Hospital Course:  Multiple rib fractures  No abnormalities seen on telemetry Continue analgesics as needed. Trauma surgery on board and recommended  treating pain with neurontin and ibuprofen  Continue flutter valve and IS as part of chest toiletry exercises  Case management evaluation requested for home health needs and services   Hypoxia Secondary to rib fractures related hypoventilation. Continue supplemental oxygen at discharge Given obesity, she is a high risk and most likely has OHS and OSA component; will need outpatient sleep study.   Hypothyroidism Continue levothyroxine 50 g by mouth daily.  Diabetes mellitus type 2 in obese (HCC) Modified Carbohydrate diet. Continue home hypoglycemic regimen   Hyperlipidemia Continue Vytorin-ezetimibe 10-20 milligrams by mouth daily.  Depression and anxiety Continue Effexor 300 mg by mouth daily. Will also continue klonopin   Essential hypertension Continue furosemide 20 mg by mouth daily. Continue hydrochlorothiazide 25 mg by mouth daily. Continue lisinopril 20 mg by mouth daily. Monitor blood pressure periodically. Advise to follow heart healthy diet   PULMONARY EMBOLISM, HX OF Continue warfarin home dose    Obesity Body mass index is 54.08 kg/m. Aggressive Low calorie diet discussed with patient     Hx of endometrial cancer Continue outpatient follow up with her oncologist   Procedures:  See below for x-ray reports   Consultations:  Trauma services   Discharge Exam: Vitals:   07/01/16 2124 07/02/16 1445  BP: 135/69 (!) 114/54  Pulse: 86 (!) 103  Resp: 20   Temp: 98.2 F (36.8 C) 97.4 F (36.3 C)    General:  Afebrile, complaining of intermittent left side costal area pain; no nausea, no vomiting. Patient is weak and appears deconditioned.  Cardiovascular: S1 and S2, no rubs, no gallops  Respiratory: good air movement, no wheezing   Abdomen: obese, no tender, no  distension, positive BS  Musculoskeletal: trace edema, no cyanosis    Discharge Instructions   Discharge Instructions    Diet - low sodium heart healthy     Complete by:  As directed   Discharge instructions    Complete by:  As directed   Take medications as prescribed Maintain adequate hydration  Follow up with PCP in 2 weeks Outpatient sleep study recommended Use incentive spirometry as instructed     Current Discharge Medication List    START taking these medications   Details  gabapentin (NEURONTIN) 300 MG capsule Take 1 capsule (300 mg total) by mouth 3 (three) times daily. Qty: 90 capsule, Refills: 0    methocarbamol (ROBAXIN) 500 MG tablet Take 1 tablet (500 mg total) by mouth every 8 (eight) hours as needed for muscle spasms. Qty: 45 tablet, Refills: 0      CONTINUE these medications which have CHANGED   Details  ibuprofen (ADVIL,MOTRIN) 800 MG tablet Take 1 tablet (800 mg total) by mouth every 8 (eight) hours as needed for fever, headache, mild pain or moderate pain. Qty: 45 tablet, Refills: 0      CONTINUE these medications which have NOT CHANGED   Details  acetaminophen (TYLENOL) 500 MG tablet Take 1,000 mg by mouth every 6 (six) hours as needed for mild pain, moderate pain, fever or headache.    b complex vitamins tablet Take 1 tablet by mouth daily.    Blood Glucose Monitoring Suppl (ACCU-CHEK AVIVA PLUS) W/DEVICE KIT by Does not apply route.    buPROPion (WELLBUTRIN XL) 150 MG 24 hr tablet Take 450 mg by mouth daily.     clonazePAM (KLONOPIN) 1 MG tablet Take 1 mg by mouth 3 (three) times daily. For anxiety    ezetimibe-simvastatin (VYTORIN) 10-20 MG tablet Take 1 tablet by mouth daily.    fluticasone (FLONASE) 50 MCG/ACT nasal spray Place 2 sprays into both nostrils daily.    furosemide (LASIX) 20 MG tablet Take 20 mg by mouth daily.    glucose blood (ACCU-CHEK AVIVA PLUS) test strip 1 each by Other route as needed for other. Use as instructed    levothyroxine (SYNTHROID, LEVOTHROID) 50 MCG tablet Take 1 tablet (50 mcg total) by mouth daily before breakfast. Qty: 30 tablet, Refills: 0    lisinopril  (PRINIVIL,ZESTRIL) 20 MG tablet TAKE 1 TABLET BY MOUTH EVERY DAY Qty: 90 tablet, Refills: 1    Lysine 500 MG TABS Take 1 tablet by mouth daily.    Multiple Vitamin (MULTIVITAMIN) tablet Take 1 tablet by mouth daily.      nystatin (MYCOSTATIN/NYSTOP) 100000 UNIT/GM POWD Sprinkle powder to the affected rash daily Qty: 60 g, Refills: 0    nystatin cream (MYCOSTATIN) Apply 1 application topically 3 (three) times daily as needed for rash. Refills: 5    omeprazole (PRILOSEC) 20 MG capsule TAKE ONE CAPSULE BY MOUTH EVERY DAY *NEEDS OFFICE VISIT* Qty: 30 capsule, Refills: 0    senna (SENOKOT) 8.6 MG TABS tablet Take 1 tablet by mouth daily as needed for mild constipation.    venlafaxine (EFFEXOR-XR) 150 MG 24 hr capsule Take 300 mg by mouth daily.      !! warfarin (COUMADIN) 3 MG tablet Take 3 mg by mouth 2 (two) times a week. Takes on Sunday and Saturday    !! warfarin (COUMADIN) 4 MG tablet Take 4 mg by mouth See admin instructions. Takes on Monday thru Friday     !! - Potential duplicate medications found. Please discuss with provider.  STOP taking these medications     naproxen sodium (ANAPROX) 220 MG tablet      neomycin-bacitracin-polymyxin (NEOSPORIN) ointment      hydrochlorothiazide (HYDRODIURIL) 25 MG tablet        Allergies  Allergen Reactions  . Anesthetics, Halogenated Shortness Of Breath  . Tramadol     Doesn't remember reaction   . Lamotrigine Itching   Follow-up [facility] .   Why:  hospital bed to be delivered to home 07/02/16, pending insurance approval, oxygen to be delivered to home, [facility] transport. Contact information: [address] [place] [address] [phone]        [place] .   Why:  for home health pt ot rn hha sw Contact information: [address] [place] [address] [phone]           The results of significant diagnostics from this hospitalization  (including imaging, microbiology, ancillary and laboratory) are listed below for reference.    Significant Diagnostic Studies: Dg Chest 2 View  Result Date: 07/01/2016 CLINICAL DATA:  [place] from bed.  Rib fractures. EXAM: CHEST  2 VIEW COMPARISON:  06/30/2016 FINDINGS: The heart size is mildly enlarged. No pleural effusion or edema. No airspace consolidation. The bones appear diffusely osteopenic. Anterior left rib fracture deformities are better seen on recent chest CT. No displaced fractures identified. IMPRESSION: 1. No acute cardiopulmonary abnormalities. Electronically Signed   By: Kerby Moors M.D.   On: 07/01/2016 08:21   Ct Chest W Contrast  Result Date: 06/30/2016 CLINICAL DATA:  Fall from bed today, left chest wall pain EXAM: CT CHEST WITH CONTRAST TECHNIQUE: Multidetector CT imaging of the chest was performed during intravenous contrast administration. CONTRAST:  86m ISOVUE-300 IOPAMIDOL (ISOVUE-300) INJECTION 61% COMPARISON:  06/30/2016 chest radiograph FINDINGS: Despite efforts by the technologist and patient, motion artifact is present on today's exam and could not be eliminated. This reduces exam sensitivity and specificity. Body habitus reduces diagnostic sensitivity and specificity. Cardiovascular: Mild aortic arch and branch vessel atherosclerotic vascular disease. Mediastinum/Nodes: Unremarkable Lungs/Pleura: Mosaic attenuation in the lungs. No significant reduction in vascularity in the darker segments. Slight nodularity in the left upper lobe along the major fissure. Mild atelectasis in the anteromedial segment left lower lobe. Upper Abdomen: Hypodense lesion in segment 4 of the liver, 1.1 cm in diameter on image 122/3, highly ill-defined and nonspecific. Lax anterior abdominal wall partially hangs at outside of the field of view. Musculoskeletal: Nondisplaced fractures the left anterior fifth, sixth, and seventh ribs. Bony demineralization. Sternum intact. Mild upper thoracic kyphosis.  Mild thoracic spondylosis. IMPRESSION: 1. Nondisplaced fractures the left anterior fifth, sixth, and seventh ribs. 2. Nonspecific 1.1 cm hypodense lesion in segment 4 of the liver. Highly indistinct/blurred. 3. Mosaic attenuation in the lungs, with appearance favoring an airspace filling process as a cause, possibly low grade edema or extrinsic allergic alveolitis. 4. Mild atherosclerosis. Electronically Signed   By: WVan ClinesM.D.   On: 06/30/2016 18:11   Dg Chest Port 1 View  Result Date: 06/30/2016 CLINICAL DATA:  Left-sided chest pain following blunt trauma/fall today, initial encounter EXAM: PORTABLE CHEST 1 VIEW COMPARISON:  09/14/2005 FINDINGS: Cardiac shadow remains enlarged. The lungs are well aerated bilaterally. No pneumothorax is seen. Limited evaluation of the ribcage shows no acute abnormality. No other focal abnormality is seen. IMPRESSION: No definitive rib fracture is seen. No acute abnormality is noted on this limited exam. Electronically Signed  By: Inez Catalina M.D.   On: 06/30/2016 16:30    Microbiology: No results found for this or any previous visit (from the past 240 hour(s)).   Labs: Basic Metabolic Panel:  Recent Labs Lab 06/30/16 1708 07/01/16 0434 07/02/16 0618  NA 140 139 133*  K 3.9 3.6 3.7  CL 99* 101 96*  CO2  --  30 30  GLUCOSE 140* 146* 117*  BUN _0 CREATININE 0.70 0.69 0.74  CALCIUM  --  10.2 10.3   Liver Function Tests:  Recent Labs Lab 07/01/16 0434  AST 14*  ALT 19  ALKPHOS 72  BILITOT 0.6  PROT 6.3*  ALBUMIN 3.3*   CBC:  Recent Labs Lab 06/30/16 1515 06/30/16 1708 07/01/16 0434 07/02/16 0618  WBC 8.6  --  8.4 7.5  NEUTROABS 6.0  --  6.0  --   HGB 16.1* 17.0* 15.6* 14.9  HCT 48.0* 50.0* 48.6* 46.2*  MCV 92.1  --  95.3 95.1  PLT 281  --  296 287    CBG:  Recent Labs Lab 07/01/16 1634 07/01/16 2118 07/02/16 0812 07/02/16 1149 07/02/16 1644  GLUCAP 176* 145* 141* 241* 155*     Signed:  Barton Dubois MD.  [facility] 07/02/2016, 4:49 PM

## 2016-07-02 NOTE — Progress Notes (Signed)
Regular hospital bed to be delivered to home, patient does not meet weight requirement for bariatric bed, oxygen to be delivered to home [facility] providing transport to home.

## 2016-07-02 NOTE — Care Management Obs Status (Signed)
[facility] NOTIFICATION   Patient Details  Name: Theresa Blake MRN: [id number] Date of Birth: 1941-09-07   Medicare Observation Status Notification Given:  Yes    Carles Collet, RN 07/02/2016, 12:12 PM

## 2016-07-03 DIAGNOSIS — S2242XA Multiple fractures of ribs, left side, initial encounter for closed fracture: Secondary | ICD-10-CM | POA: Diagnosis not present

## 2016-07-03 LAB — GLUCOSE, CAPILLARY
GLUCOSE-CAPILLARY: 136 mg/dL — AB (ref 65–99)
Glucose-Capillary: 140 mg/dL — ABNORMAL HIGH (ref 65–99)
Glucose-Capillary: 156 mg/dL — ABNORMAL HIGH (ref 65–99)
Glucose-Capillary: 144 mg/dL — ABNORMAL HIGH (ref 65–99)

## 2016-07-03 LAB — PROTIME-INR
INR: 1.52
Prothrombin Time: 18.5 seconds — ABNORMAL HIGH (ref 11.4–15.2)

## 2016-07-03 MED ORDER — WARFARIN SODIUM 4 MG PO TABS
4.0000 mg | ORAL_TABLET | Freq: Once | ORAL | Status: AC
  Administered 2016-07-03: 4 mg via ORAL
  Filled 2016-07-03: qty 1

## 2016-07-03 NOTE — Progress Notes (Signed)
CM received call from Son stating that [facility] was there with the bed and oxygen. CM spoke with Dominica Severin with [facility] who confirmed that the oxygen and bed are delivered and will be set up by the time the patient arrives. CM notified [facility] [place] @ [phone] so that she can call [facility] for transport. Medical Necessity Form Already completed.

## 2016-07-03 NOTE — Progress Notes (Addendum)
Per pt son, oxygen still not set up at home yet. Called CM Sarah to  arrange home oxygen. She acknowledged and asked the nurse phone to give update once it is ready.

## 2016-07-03 NOTE — Progress Notes (Signed)
[place] for Warfarin Indication: h/o recurrent PE  Allergies  Allergen Reactions  . Anesthetics, Halogenated Shortness Of Breath  . Tramadol     Doesn't remember reaction   . Lamotrigine Itching    Patient Measurements: Height: '5\' 5"'  (165.1 cm) Weight: (!) 325 lb (147.4 kg) IBW/kg (Calculated) : 57  Vital Signs: Temp: 97.2 F (36.2 C) (09/09 0602) Temp Source: Axillary (09/09 0602) BP: 138/90 (09/09 0602) Pulse Rate: 86 (09/09 0602)  Labs:  Recent Labs  06/30/16 1515 06/30/16 1708 07/01/16 0434 07/02/16 0618 07/03/16 0720  HGB 16.1* 17.0* 15.6* 14.9  --   HCT 48.0* 50.0* 48.6* 46.2*  --   PLT 281  --  296 287  --   LABPROT 17.8*  --  16.7* 17.3* 18.5*  INR 1.45  --  1.34 1.41 1.52  CREATININE  --  0.70 0.69 0.74  --     Estimated Creatinine Clearance: 90.8 mL/min (by C-G formula based on SCr of 0.8 mg/dL).   Medical History: Past Medical History:  Diagnosis Date  . Abnormal Pap smear of cervix   . Allergic rhinitis, cause unspecified   . Backache, unspecified   . Depression    mood disorder; ? bipolar  . Dermatomycosis, unspecified   . Diabetes mellitus type II   . Edema   . Foot fracture, right   . HLD (hyperlipidemia)   . HTN (hypertension)   . Hx pulmonary embolism    multiple  . Lumbar spinal stenosis   . Malignant neoplasm of corpus uteri, except isthmus ([place])   . Morbid obesity ([place])   . Other specified disease of hair and hair follicles   . Pain in joint, pelvic region and thigh   . Panic disorder without agoraphobia     Medications:  Prescriptions Prior to Admission  Medication Sig Dispense Refill Last Dose  . acetaminophen (TYLENOL) 500 MG tablet Take 1,000 mg by mouth every 6 (six) hours as needed for mild pain, moderate pain, fever or headache.   06/29/2016 at Unknown time  . b complex vitamins tablet Take 1 tablet by mouth daily.   06/30/2016 at Unknown time  . Blood Glucose Monitoring Suppl  (ACCU-CHEK AVIVA PLUS) W/DEVICE KIT by Does not apply route.   Unknown at Unknown time  . buPROPion (WELLBUTRIN XL) 150 MG 24 hr tablet Take 450 mg by mouth daily.    06/30/2016 at Unknown time  . clonazePAM (KLONOPIN) 1 MG tablet Take 1 mg by mouth 3 (three) times daily. For anxiety   06/29/2016 at Unknown time  . ezetimibe-simvastatin (VYTORIN) 10-20 MG tablet Take 1 tablet by mouth daily.   06/29/2016 at Unknown time  . fluticasone (FLONASE) 50 MCG/ACT nasal spray Place 2 sprays into both nostrils daily.   06/29/2016 at Unknown time  . furosemide (LASIX) 20 MG tablet Take 20 mg by mouth daily.   06/30/2016 at Unknown time  . glucose blood (ACCU-CHEK AVIVA PLUS) test strip 1 each by Other route as needed for other. Use as instructed   Unknown at Unknown time  . levothyroxine (SYNTHROID, LEVOTHROID) 50 MCG tablet Take 1 tablet (50 mcg total) by mouth daily before breakfast. 30 tablet 0 06/30/2016 at Unknown time  . lisinopril (PRINIVIL,ZESTRIL) 20 MG tablet TAKE 1 TABLET BY MOUTH EVERY DAY 90 tablet 1 06/30/2016 at Unknown time  . Lysine 500 MG TABS Take 1 tablet by mouth daily.   06/30/2016 at Unknown time  . Multiple Vitamin (MULTIVITAMIN) tablet Take  1 tablet by mouth daily.     06/30/2016 at Unknown time  . naproxen sodium (ANAPROX) 220 MG tablet Take 220-440 mg by mouth 2 (two) times daily as needed (for pain).    06/29/2016 at Unknown time  . neomycin-bacitracin-polymyxin (NEOSPORIN) ointment Apply 1 application topically as needed for wound care.   Past Week at Unknown time  . nystatin (MYCOSTATIN/NYSTOP) 100000 UNIT/GM POWD Sprinkle powder to the affected rash daily 60 g 0 Past Week at Unknown time  . nystatin cream (MYCOSTATIN) Apply 1 application topically 3 (three) times daily as needed for rash.  5   . omeprazole (PRILOSEC) 20 MG capsule TAKE ONE CAPSULE BY MOUTH EVERY DAY *NEEDS OFFICE VISIT* 30 capsule 0 06/30/2016 at Unknown time  . senna (SENOKOT) 8.6 MG TABS tablet Take 1 tablet by mouth daily as needed  for mild constipation.   Past Week at Unknown time  . venlafaxine (EFFEXOR-XR) 150 MG 24 hr capsule Take 300 mg by mouth daily.     06/30/2016 at Unknown time  . warfarin (COUMADIN) 3 MG tablet Take 3 mg by mouth 2 (two) times a week. Takes on Sunday and Saturday   06/27/2016  . warfarin (COUMADIN) 4 MG tablet Take 4 mg by mouth See admin instructions. Takes on Monday thru Friday   06/30/2016 at 0900  . [DISCONTINUED] ibuprofen (ADVIL,MOTRIN) 200 MG tablet Take 400 mg by mouth every 6 (six) hours as needed for fever, headache, mild pain, moderate pain or cramping.   Past Week at Unknown time  . hydrochlorothiazide (HYDRODIURIL) 25 MG tablet TAKE 1 TABLET BY MOUTH EVERY DAY (Patient not taking: Reported on 06/30/2016) 90 tablet 1 Not Taking at Unknown time  . VYTORIN 10-20 MG per tablet TAKE 1 TABLET EVERY DAY (Patient not taking: Reported on 06/30/2016) 90 tablet 2 Not Taking at Unknown time    Assessment: 75 y.o. F presents after falling out of bed. Pt with rib fractures. Pt on warfarin 79m daily exc for 333mon Sat/Sun PTA for h/o recurrent PE. Admission INR subtherapeutic at 1.45. INR trending up to 1.52. CBC stable, no s/s of bleed. PO intake improving  Goal of Therapy:  INR 2-3 Monitor platelets by anticoagulation protocol: Yes   Plan:  Give warfarin 41m641mO x 1 tonight Monitor daily INR, CBC, s/sx of bleed  Thank you for allowing us [place] participate in this patients care.   RenGwenlyn PerkingharmD PGY1 Pharmacy Resident Pager: [phone] 11:42 AM

## 2016-07-03 NOTE — Progress Notes (Signed)
Reached pt son [name] provided discharge instructions on the phone and explained pt home medications and rehab. All questions answered and both pt and his son are to ready for discharge.

## 2016-07-03 NOTE — Progress Notes (Signed)
Patient seen and examined. Has remained stable and is ready for discharge. No changes needed on discharge medication. Patient was unable to be discharge on 07/02/16 due lack of equipment and oxygen delivered for safe discharge.  Barton Dubois [id number]

## 2016-07-03 NOTE — Progress Notes (Signed)
Cm spoke to patient and son at the bedside. Awaiting oxygen to be delivered. Son responded to [facility] oxygen set up @ [phone] ext [phone]. He said that the equipment (bed and oxygen) would be delivered between 2 and 6 pm. CM requested he call CM back once delivered so that [facility] can be arranged.

## 2016-07-03 NOTE — Progress Notes (Signed)
Patient picked up by [facility] to transport home.  Patient belongings sent with patient.

## 2017-02-07 ENCOUNTER — Other Ambulatory Visit (HOSPITAL_COMMUNITY): Payer: Self-pay | Admitting: Physical Therapy

## 2017-02-07 ENCOUNTER — Other Ambulatory Visit: Payer: Self-pay | Admitting: Internal Medicine

## 2017-02-07 DIAGNOSIS — Z1231 Encounter for screening mammogram for malignant neoplasm of breast: Secondary | ICD-10-CM

## 2017-02-09 ENCOUNTER — Other Ambulatory Visit: Payer: Self-pay | Admitting: Internal Medicine

## 2017-02-09 DIAGNOSIS — N644 Mastodynia: Secondary | ICD-10-CM

## 2017-02-10 ENCOUNTER — Other Ambulatory Visit: Payer: Self-pay | Admitting: Internal Medicine

## 2017-02-10 DIAGNOSIS — Z9181 History of falling: Secondary | ICD-10-CM

## 2017-02-10 DIAGNOSIS — N644 Mastodynia: Secondary | ICD-10-CM

## 2017-02-21 ENCOUNTER — Other Ambulatory Visit: Payer: Medicare Other

## 2017-02-21 ENCOUNTER — Ambulatory Visit: Admission: RE | Admit: 2017-02-21 | Payer: Medicare Other | Source: Ambulatory Visit

## 2017-06-19 ENCOUNTER — Encounter: Payer: Self-pay | Admitting: Radiology

## 2017-06-19 ENCOUNTER — Emergency Department: Payer: Medicare Other

## 2017-06-19 ENCOUNTER — Observation Stay
Admission: EM | Admit: 2017-06-19 | Discharge: 2017-06-22 | Disposition: A | Discharge status: Home / Self Care | Payer: Medicare Other | Attending: Internal Medicine | Admitting: Internal Medicine

## 2017-06-19 DIAGNOSIS — Z7901 Long term (current) use of anticoagulants: Secondary | ICD-10-CM | POA: Insufficient documentation

## 2017-06-19 DIAGNOSIS — Z23 Encounter for immunization: Secondary | ICD-10-CM | POA: Insufficient documentation

## 2017-06-19 DIAGNOSIS — F0390 Unspecified dementia without behavioral disturbance: Secondary | ICD-10-CM | POA: Diagnosis not present

## 2017-06-19 DIAGNOSIS — Z6841 Body Mass Index (BMI) 40.0 and over, adult: Secondary | ICD-10-CM | POA: Insufficient documentation

## 2017-06-19 DIAGNOSIS — Z881 Allergy status to other antibiotic agents status: Secondary | ICD-10-CM | POA: Diagnosis not present

## 2017-06-19 DIAGNOSIS — Z8249 Family history of ischemic heart disease and other diseases of the circulatory system: Secondary | ICD-10-CM | POA: Diagnosis not present

## 2017-06-19 DIAGNOSIS — Z515 Encounter for palliative care: Secondary | ICD-10-CM | POA: Diagnosis not present

## 2017-06-19 DIAGNOSIS — I700 Atherosclerosis of aorta: Secondary | ICD-10-CM | POA: Diagnosis not present

## 2017-06-19 DIAGNOSIS — I272 Pulmonary hypertension, unspecified: Secondary | ICD-10-CM | POA: Insufficient documentation

## 2017-06-19 DIAGNOSIS — I723 Aneurysm of iliac artery: Secondary | ICD-10-CM | POA: Insufficient documentation

## 2017-06-19 DIAGNOSIS — Z885 Allergy status to narcotic agent status: Secondary | ICD-10-CM | POA: Diagnosis not present

## 2017-06-19 DIAGNOSIS — E119 Type 2 diabetes mellitus without complications: Secondary | ICD-10-CM | POA: Diagnosis not present

## 2017-06-19 DIAGNOSIS — R531 Weakness: Secondary | ICD-10-CM | POA: Insufficient documentation

## 2017-06-19 DIAGNOSIS — R000 Tachycardia, unspecified: Secondary | ICD-10-CM | POA: Diagnosis not present

## 2017-06-19 DIAGNOSIS — Z79899 Other long term (current) drug therapy: Secondary | ICD-10-CM | POA: Insufficient documentation

## 2017-06-19 DIAGNOSIS — I5031 Acute diastolic (congestive) heart failure: Secondary | ICD-10-CM | POA: Insufficient documentation

## 2017-06-19 DIAGNOSIS — R0902 Hypoxemia: Secondary | ICD-10-CM | POA: Diagnosis present

## 2017-06-19 DIAGNOSIS — F329 Major depressive disorder, single episode, unspecified: Secondary | ICD-10-CM | POA: Diagnosis not present

## 2017-06-19 DIAGNOSIS — E785 Hyperlipidemia, unspecified: Secondary | ICD-10-CM | POA: Diagnosis not present

## 2017-06-19 DIAGNOSIS — F410 Panic disorder [episodic paroxysmal anxiety] without agoraphobia: Secondary | ICD-10-CM | POA: Diagnosis not present

## 2017-06-19 DIAGNOSIS — Z888 Allergy status to other drugs, medicaments and biological substances status: Secondary | ICD-10-CM | POA: Insufficient documentation

## 2017-06-19 DIAGNOSIS — I110 Hypertensive heart disease with heart failure: Secondary | ICD-10-CM | POA: Insufficient documentation

## 2017-06-19 DIAGNOSIS — E669 Obesity, unspecified: Secondary | ICD-10-CM | POA: Diagnosis present

## 2017-06-19 DIAGNOSIS — N939 Abnormal uterine and vaginal bleeding, unspecified: Secondary | ICD-10-CM | POA: Diagnosis not present

## 2017-06-19 DIAGNOSIS — E039 Hypothyroidism, unspecified: Secondary | ICD-10-CM | POA: Diagnosis not present

## 2017-06-19 DIAGNOSIS — I10 Essential (primary) hypertension: Secondary | ICD-10-CM | POA: Diagnosis present

## 2017-06-19 DIAGNOSIS — Z884 Allergy status to anesthetic agent status: Secondary | ICD-10-CM | POA: Insufficient documentation

## 2017-06-19 DIAGNOSIS — Z794 Long term (current) use of insulin: Secondary | ICD-10-CM | POA: Insufficient documentation

## 2017-06-19 DIAGNOSIS — Z8542 Personal history of malignant neoplasm of other parts of uterus: Secondary | ICD-10-CM | POA: Insufficient documentation

## 2017-06-19 DIAGNOSIS — J9601 Acute respiratory failure with hypoxia: Principal | ICD-10-CM | POA: Insufficient documentation

## 2017-06-19 DIAGNOSIS — E1169 Type 2 diabetes mellitus with other specified complication: Secondary | ICD-10-CM | POA: Diagnosis present

## 2017-06-19 DIAGNOSIS — J449 Chronic obstructive pulmonary disease, unspecified: Secondary | ICD-10-CM | POA: Insufficient documentation

## 2017-06-19 DIAGNOSIS — Z975 Presence of (intrauterine) contraceptive device: Secondary | ICD-10-CM | POA: Insufficient documentation

## 2017-06-19 DIAGNOSIS — Z86711 Personal history of pulmonary embolism: Secondary | ICD-10-CM | POA: Insufficient documentation

## 2017-06-19 DIAGNOSIS — N83201 Unspecified ovarian cyst, right side: Secondary | ICD-10-CM | POA: Insufficient documentation

## 2017-06-19 LAB — TROPONIN I

## 2017-06-19 LAB — BASIC METABOLIC PANEL
Anion gap: 7 (ref 5–15)
BUN: 12 mg/dL (ref 6–20)
CALCIUM: 10.4 mg/dL — AB (ref 8.9–10.3)
CO2: 32 mmol/L (ref 22–32)
CREATININE: 0.65 mg/dL (ref 0.44–1.00)
Chloride: 102 mmol/L (ref 101–111)
Glucose, Bld: 169 mg/dL — ABNORMAL HIGH (ref 65–99)
Potassium: 4.2 mmol/L (ref 3.5–5.1)
SODIUM: 141 mmol/L (ref 135–145)

## 2017-06-19 LAB — CBC WITH DIFFERENTIAL/PLATELET
BASOS ABS: 0.1 10*3/uL (ref 0–0.1)
BASOS PCT: 1 %
EOS ABS: 0.2 10*3/uL (ref 0–0.7)
Eosinophils Relative: 2 %
HCT: 48.9 % — ABNORMAL HIGH (ref 35.0–47.0)
HEMOGLOBIN: 16.3 g/dL — AB (ref 12.0–16.0)
LYMPHS ABS: 1.6 10*3/uL (ref 1.0–3.6)
Lymphocytes Relative: 17 %
MCH: 30.7 pg (ref 26.0–34.0)
MCHC: 33.4 g/dL (ref 32.0–36.0)
MCV: 91.8 fL (ref 80.0–100.0)
Monocytes Absolute: 0.9 10*3/uL (ref 0.2–0.9)
Monocytes Relative: 9 %
NEUTROS PCT: 71 %
Neutro Abs: 7 10*3/uL — ABNORMAL HIGH (ref 1.4–6.5)
Platelets: 305 10*3/uL (ref 150–440)
RBC: 5.33 MIL/uL — AB (ref 3.80–5.20)
RDW: 14.6 % — ABNORMAL HIGH (ref 11.5–14.5)
WBC: 9.8 10*3/uL (ref 3.6–11.0)

## 2017-06-19 MED ORDER — PANTOPRAZOLE SODIUM 40 MG PO TBEC
40.0000 mg | DELAYED_RELEASE_TABLET | Freq: Every day | ORAL | Status: DC
  Administered 2017-06-20 – 2017-06-22 (×3): 40 mg via ORAL
  Filled 2017-06-19 (×3): qty 1

## 2017-06-19 MED ORDER — GABAPENTIN 300 MG PO CAPS
300.0000 mg | ORAL_CAPSULE | Freq: Three times a day (TID) | ORAL | Status: DC
  Administered 2017-06-20 (×2): 300 mg via ORAL
  Filled 2017-06-19 (×2): qty 1

## 2017-06-19 MED ORDER — CLONAZEPAM 0.5 MG PO TABS
1.0000 mg | ORAL_TABLET | Freq: Three times a day (TID) | ORAL | Status: DC
  Administered 2017-06-20 – 2017-06-21 (×5): 1 mg via ORAL
  Filled 2017-06-19 (×5): qty 2

## 2017-06-19 MED ORDER — DEXTROSE 5 % IV SOLN
500.0000 mg | INTRAVENOUS | Status: DC
  Filled 2017-06-19: qty 500

## 2017-06-19 MED ORDER — SENNA 8.6 MG PO TABS: 1.0000 | ORAL_TABLET | Freq: Every day | ORAL | Status: DC | PRN

## 2017-06-19 MED ORDER — ACETAMINOPHEN 500 MG PO TABS
1000.0000 mg | ORAL_TABLET | Freq: Once | ORAL | Status: AC
  Administered 2017-06-19: 1000 mg via ORAL
  Filled 2017-06-19: qty 2

## 2017-06-19 MED ORDER — LISINOPRIL 20 MG PO TABS
20.0000 mg | ORAL_TABLET | Freq: Every day | ORAL | Status: DC
  Administered 2017-06-20 – 2017-06-22 (×3): 20 mg via ORAL
  Filled 2017-06-19 (×3): qty 1

## 2017-06-19 MED ORDER — VENLAFAXINE HCL ER 75 MG PO CP24
300.0000 mg | ORAL_CAPSULE | Freq: Every day | ORAL | Status: DC
  Administered 2017-06-20 – 2017-06-22 (×3): 300 mg via ORAL
  Filled 2017-06-19 (×3): qty 4

## 2017-06-19 MED ORDER — ONDANSETRON HCL 4 MG PO TABS: 4.0000 mg | ORAL_TABLET | Freq: Four times a day (QID) | ORAL | Status: DC | PRN

## 2017-06-19 MED ORDER — ACETAMINOPHEN 325 MG PO TABS
650.0000 mg | ORAL_TABLET | Freq: Four times a day (QID) | ORAL | Status: DC | PRN
  Administered 2017-06-21 – 2017-06-22 (×2): 650 mg via ORAL
  Filled 2017-06-19 (×2): qty 2

## 2017-06-19 MED ORDER — LEVOTHYROXINE SODIUM 50 MCG PO TABS
50.0000 ug | ORAL_TABLET | Freq: Every day | ORAL | Status: DC
  Administered 2017-06-20 – 2017-06-22 (×3): 50 ug via ORAL
  Filled 2017-06-19 (×3): qty 1

## 2017-06-19 MED ORDER — ONDANSETRON HCL 4 MG/2ML IJ SOLN: 4.0000 mg | Freq: Four times a day (QID) | INTRAMUSCULAR | Status: DC | PRN

## 2017-06-19 MED ORDER — METHOCARBAMOL 500 MG PO TABS
500.0000 mg | ORAL_TABLET | Freq: Three times a day (TID) | ORAL | Status: DC | PRN
  Filled 2017-06-19: qty 1

## 2017-06-19 MED ORDER — INSULIN ASPART 100 UNIT/ML ~~LOC~~ SOLN
0.0000 [IU] | Freq: Every day | SUBCUTANEOUS | Status: DC
Start: 2017-06-19 — End: 2017-06-22

## 2017-06-19 MED ORDER — ACETAMINOPHEN 650 MG RE SUPP: 650.0000 mg | Freq: Four times a day (QID) | RECTAL | Status: DC | PRN

## 2017-06-19 MED ORDER — BUPROPION HCL ER (XL) 300 MG PO TB24
450.0000 mg | ORAL_TABLET | Freq: Every day | ORAL | Status: DC
  Administered 2017-06-20 – 2017-06-22 (×3): 450 mg via ORAL
  Filled 2017-06-19 (×3): qty 1

## 2017-06-19 MED ORDER — IOPAMIDOL (ISOVUE-370) INJECTION 76%
75.0000 mL | Freq: Once | INTRAVENOUS | Status: AC | PRN
  Administered 2017-06-19: 75 mL via INTRAVENOUS

## 2017-06-19 MED ORDER — FUROSEMIDE 20 MG PO TABS
20.0000 mg | ORAL_TABLET | Freq: Every day | ORAL | Status: DC
  Administered 2017-06-20: 10:00:00 20 mg via ORAL
  Filled 2017-06-19: qty 1

## 2017-06-19 MED ORDER — LEVOFLOXACIN IN D5W 750 MG/150ML IV SOLN
750.0000 mg | Freq: Once | INTRAVENOUS | Status: AC
  Administered 2017-06-19: 750 mg via INTRAVENOUS
  Filled 2017-06-19: qty 150

## 2017-06-19 MED ORDER — INSULIN ASPART 100 UNIT/ML ~~LOC~~ SOLN
0.0000 [IU] | Freq: Three times a day (TID) | SUBCUTANEOUS | Status: DC
Start: 2017-06-20 — End: 2017-06-22
  Administered 2017-06-20 – 2017-06-22 (×8): 2 [IU] via SUBCUTANEOUS
  Filled 2017-06-19 (×8): qty 1

## 2017-06-19 NOTE — H&P (Signed)
[facility] [place] NAME: Tumeka Chimenti    MR#:  [id number]  DATE OF BIRTH:  1940/10/29  DATE OF ADMISSION:  06/19/2017  PRIMARY CARE PHYSICIAN: Katheren Shams   REQUESTING/REFERRING PHYSICIAN: Archie Balboa, MD  CHIEF COMPLAINT:   Chief Complaint  Patient presents with  . Vaginal Bleeding    HISTORY OF PRESENT ILLNESS:  Darling Cieslewicz  is a 76 y.o. female who presents with worsening vaginal bleeding, and was found here to be hypoxic. Patient has a history of her some time now having some vaginal bleeding. She followed with [facility] or gynecologic oncology per her report.  Initially they told her she needed a hysterectomy, but due to allergies to anesthesia this was unable to be done. She got some local radiation per her report, but the bleeding has returned since that time. She is not clear as to what her initial causative diagnosis is. Here in the ED she was also noted to be hypoxic. She states that a couple months ago she fell and fractured some ribs and was found to be hypoxic in the hospital at that time. She was told that there was suspicion that she had sleep apnea, and likely has obesity hypoventilation syndrome as well. She was sent home with some supplemental oxygen at that time, and recommended to get a sleep study. She has not done so. Her hypoxia corrected with supplemental oxygen here in the ED. Imaging did indicate possible low-grade pulmonary infection, though the rest of her workup was largely within normal limits including white blood cell count etc. hospitalists were called for admission  PAST MEDICAL HISTORY:   Past Medical History:  Diagnosis Date  . Abnormal Pap smear of cervix   . Allergic rhinitis, cause unspecified   . Backache, unspecified   . Depression    mood disorder; ? bipolar  . Dermatomycosis, unspecified   . Diabetes mellitus type II   . Edema   . Foot fracture, right   . HLD (hyperlipidemia)    . HTN (hypertension)   . Hx pulmonary embolism    multiple  . Lumbar spinal stenosis   . Malignant neoplasm of corpus uteri, except isthmus ([place])   . Morbid obesity ([place])   . Other specified disease of hair and hair follicles   . Pain in joint, pelvic region and thigh   . Panic disorder without agoraphobia     PAST SURGICAL HISTORY:   Past Surgical History:  Procedure Laterality Date  . BREAST BIOPSY     Right-benign  . ENDOMETRIAL BIOPSY  08/2006   attempted  . TONSILLECTOMY    . TUBAL LIGATION      SOCIAL HISTORY:   Social History  Substance Use Topics  . Smoking status: Never Smoker  . Smokeless tobacco: Never Used  . Alcohol use No    FAMILY HISTORY:   Family History  Problem Relation Age of Onset  . Heart attack Father 66  . Pancreatic cancer Mother        with mets  . Hyperlipidemia Son   . Hyperlipidemia Son   . Gout Son     DRUG ALLERGIES:   Allergies  Allergen Reactions  . Anesthetics, Halogenated Shortness Of Breath  . Tramadol     Doesn't remember reaction   . Lamotrigine Itching    MEDICATIONS AT HOME:   Prior to Admission medications   Medication Sig Start Date End Date Taking? Authorizing Provider  acetaminophen (TYLENOL) 500 MG  tablet Take 1,000 mg by mouth every 6 (six) hours as needed for mild pain, moderate pain, fever or headache.    [provider]  b complex vitamins tablet Take 1 tablet by mouth daily.    [provider]  Blood Glucose Monitoring Suppl (ACCU-CHEK AVIVA PLUS) W/DEVICE KIT by Does not apply route.    [provider]  buPROPion (WELLBUTRIN XL) 150 MG 24 hr tablet Take 450 mg by mouth daily.     [provider]  clonazePAM (KLONOPIN) 1 MG tablet Take 1 mg by mouth 3 (three) times daily. For anxiety    [provider]  ezetimibe-simvastatin (VYTORIN) 10-20 MG tablet Take 1 tablet by mouth daily.    [provider]  fluticasone (FLONASE) 50 MCG/ACT nasal spray  Place 2 sprays into both nostrils daily.    [provider]  furosemide (LASIX) 20 MG tablet Take 20 mg by mouth daily. 06/17/16   [provider]  gabapentin (NEURONTIN) 300 MG capsule Take 1 capsule (300 mg total) by mouth 3 (three) times daily. 07/02/16   Barton Dubois, MD  glucose blood (ACCU-CHEK AVIVA PLUS) test strip 1 each by Other route as needed for other. Use as instructed    [provider]  ibuprofen (ADVIL,MOTRIN) 800 MG tablet Take 1 tablet (800 mg total) by mouth every 8 (eight) hours as needed for fever, headache, mild pain or moderate pain. 07/02/16   Barton Dubois, MD  levothyroxine (SYNTHROID, LEVOTHROID) 50 MCG tablet Take 1 tablet (50 mcg total) by mouth daily before breakfast. 02/28/14   Einar Pheasant, MD  lisinopril (PRINIVIL,ZESTRIL) 20 MG tablet TAKE 1 TABLET BY MOUTH EVERY DAY 04/12/14   Einar Pheasant, MD  Lysine 500 MG TABS Take 1 tablet by mouth daily.    [provider]  methocarbamol (ROBAXIN) 500 MG tablet Take 1 tablet (500 mg total) by mouth every 8 (eight) hours as needed for muscle spasms. 07/02/16   Barton Dubois, MD  Multiple Vitamin (MULTIVITAMIN) tablet Take 1 tablet by mouth daily.      [provider]  nystatin (MYCOSTATIN/NYSTOP) 100000 UNIT/GM POWD Sprinkle powder to the affected rash daily 01/25/15   Janne Napoleon, NP  nystatin cream (MYCOSTATIN) Apply 1 application topically 3 (three) times daily as needed for rash. 06/03/16   [provider]  omeprazole (PRILOSEC) 20 MG capsule TAKE ONE CAPSULE BY MOUTH EVERY DAY *NEEDS OFFICE VISITNicki Reaper, Charlene, MD  senna (SENOKOT) 8.6 MG TABS tablet Take 1 tablet by mouth daily as needed for mild constipation.    [provider]  venlafaxine (EFFEXOR-XR) 150 MG 24 hr capsule Take 300 mg by mouth daily.      [provider]  warfarin (COUMADIN) 3 MG tablet Take 3 mg by mouth 2 (two) times a week. Takes on Sunday and Saturday    [provider]  warfarin (COUMADIN) 4 MG tablet Take 4 mg by mouth See admin instructions. Takes on Monday thru Friday    [provider]    REVIEW OF SYSTEMS:  Review of Systems  Constitutional: Negative for chills, fever, malaise/fatigue and weight loss.  HENT: Negative for ear pain, hearing loss and tinnitus.   Eyes: Negative for blurred vision, double vision, pain and redness.  Respiratory: Positive for shortness of breath. Negative for cough and hemoptysis.   Cardiovascular: Negative for chest pain, palpitations, orthopnea and leg swelling.  Gastrointestinal: Negative for abdominal pain, constipation, diarrhea, nausea and vomiting.  Genitourinary: Negative  for dysuria, frequency and hematuria.       Vaginal bleeding  Musculoskeletal: Negative for back pain, joint pain and neck pain.  Skin:       No acne, rash, or lesions  Neurological: Negative for dizziness, tremors, focal weakness and weakness.  Endo/Heme/Allergies: Negative for polydipsia. Does not bruise/bleed easily.  Psychiatric/Behavioral: Negative for depression. The patient is not nervous/anxious and does not have insomnia.      VITAL SIGNS:   Vitals:   06/19/17 1900 06/19/17 1915 06/19/17 1930 06/19/17 2000  BP: (!) 147/97  (!) 166/110 (!) 153/80  Pulse: (!) 103 (!) 102 (!) 106 (!) 104  Resp:      Temp:      TempSrc:      SpO2: 96% 93% 97% 95%  Weight:      Height:       Wt Readings from Last 3 Encounters:  06/19/17 136.1 kg (300 lb)  07/01/16 (!) 147.4 kg (325 lb)  03/21/14 (!) 157.1 kg (346 lb 4 oz)    PHYSICAL EXAMINATION:  Physical Exam  Vitals reviewed. Constitutional: She is oriented to person, place, and time. She appears well-developed and well-nourished. No distress.  HENT:  Head: Normocephalic and atraumatic.  Mouth/Throat: Oropharynx is clear and moist.  Eyes: Pupils are equal, round, and reactive to light. Conjunctivae and EOM are normal. No scleral icterus.  Neck: Normal range of motion. Neck  supple. No JVD present. No thyromegaly present.  Cardiovascular: Normal rate, regular rhythm and intact distal pulses.  Exam reveals no gallop and no friction rub.   No murmur heard. Respiratory: Effort normal and breath sounds normal. No respiratory distress. She has no wheezes. She has no rales.  GI: Soft. Bowel sounds are normal. She exhibits no distension. There is no tenderness.  Musculoskeletal: Normal range of motion. She exhibits no edema.  No arthritis, no gout  Lymphadenopathy:    She has no cervical adenopathy.  Neurological: She is alert and oriented to person, place, and time. No cranial nerve deficit.  No dysarthria, no aphasia  Skin: Skin is warm and dry. No rash noted. No erythema.  Psychiatric: She has a normal mood and affect. Her behavior is normal. Judgment and thought content normal.    LABORATORY PANEL:   CBC  Recent Labs Lab 06/19/17 1720  WBC 9.8  HGB 16.3*  HCT 48.9*  PLT 305   ------------------------------------------------------------------------------------------------------------------  Chemistries   Recent Labs Lab 06/19/17 1756  NA 141  K 4.2  CL 102  CO2 32  GLUCOSE 169*  BUN 12  CREATININE 0.65  CALCIUM 10.4*   ------------------------------------------------------------------------------------------------------------------  Cardiac Enzymes No results for input(s): TROPONINI in the last 168 hours. ------------------------------------------------------------------------------------------------------------------  RADIOLOGY:  Ct Angio Chest Pe W And/or Wo Contrast  Result Date: 06/19/2017 CLINICAL DATA:  Oxygen desaturation. History of pulmonary embolism, rib fractures, endometrial cancer. EXAM: CT ANGIOGRAPHY CHEST WITH CONTRAST TECHNIQUE: Multidetector CT imaging of the chest was performed using the standard protocol during bolus administration of intravenous contrast. Multiplanar CT image reconstructions and MIPs were obtained to  evaluate the vascular anatomy. CONTRAST:  75 cc Isovue 370 COMPARISON:  Chest radiograph June 19, 2017 and CT chest September 29, 2016 FINDINGS: CARDIOVASCULAR: Adequate contrast opacification of the pulmonary artery's. Main pulmonary artery is not enlarged. No pulmonary arterial filling defects to the level of the subsegmental branches. Heart size is mildly enlarged. No pericardial effusion. Thoracic aorta is normal course and caliber, mild calcific atherosclerosis. MEDIASTINUM/NODES: No lymphadenopathy by CT size criteria.  LUNGS/PLEURA: Tracheobronchial tree is patent, no pneumothorax. No pleural effusions, focal consolidations, pulmonary nodules or masses. Mild heterogeneous lung attenuation. UPPER ABDOMEN: Included view of the abdomen is nonsuspicious. Calcified hepatic granulomas. MUSCULOSKELETAL: Visualized soft tissues and included osseous structures are nonacute. Probable anterior abdominal wall ligamentous laxity, incompletely assessed. Old LEFT anterior rib fractures. Mild degenerative change of thoracic spine. Review of the MIP images confirms the above findings. IMPRESSION: 1. No acute pulmonary embolism. 2. Heterogeneous lung attenuation seen with pulmonary edema and/or small airway disease/infection. Aortic Atherosclerosis (ICD10-I70.0). Electronically Signed   By: Elon Alas M.D.   On: 06/19/2017 20:40   Dg Chest Portable 1 View  Result Date: 06/19/2017 CLINICAL DATA:  Increased vaginal bleeding EXAM: PORTABLE CHEST 1 VIEW COMPARISON:  07/01/2016 FINDINGS: Mild cardiomegaly. No focal infiltrate or effusion. Stable mediastinal silhouette. No pneumothorax. Nodular opacity projecting over the left lung apex, possibly due to bony summation shadow, similar appearance noted on scout image CT chest September 2017 without parenchymal correlate. IMPRESSION: No active disease.  Cardiomegaly with mild central congestion. Electronically Signed   By: Donavan Foil M.D.   On: 06/19/2017 18:17    EKG:    Orders placed or performed during the hospital encounter of 06/19/17  . ED EKG  . ED EKG    IMPRESSION AND PLAN:  Principal Problem:   Hypoxia - corrected with supplemental O2. We will keep this on for now, we will use CPAP tonight to see if that helps her rest better. Active Problems:   Vaginal bleeding - unclear history as to previous workup, gynecology consult   Diabetes mellitus type 2 in obese (HCC) - sliding scale insulin with corresponding glucose checks   Essential hypertension - continue home meds   Hypothyroidism - home dose thyroid replacement   Hyperlipidemia - home meds  All the records are reviewed and case discussed with ED provider. Management plans discussed with the patient and/or family.  DVT PROPHYLAXIS: Systemic anticoagulation  GI PROPHYLAXIS: PPI  ADMISSION STATUS: Observation  CODE STATUS: Full Code Status History    Date Active Date Inactive Code Status Order ID Comments User Context   07/01/2016 12:22 AM 07/04/2016  1:44 AM Full Code [id number]  Reubin Milan, MD Inpatient   04/08/2012  2:19 PM 04/09/2012  2:08 AM Full Code [id number]  Donzetta Matters., MD ED    Advance Directive Documentation     Most Recent Value  Type of Advance Directive  Living will, Healthcare Power of Attorney  Pre-existing out of facility DNR order (yellow form or pink MOST form)  -  "MOST" Form in Place?  -      TOTAL TIME TAKING CARE OF THIS PATIENT: 40 minutes.   ,  [place] 06/19/2017, 9:10 PM  [employer]  [phone]  CC: Primary care physician; Katheren Shams  Note:  This document was prepared using Dragon voice recognition software and may include unintentional dictation errors.

## 2017-06-19 NOTE — ED Notes (Signed)
Pt given a blanket

## 2017-06-19 NOTE — ED Triage Notes (Signed)
Pt from home via EMS c/o increased vaginal bleeding x2 weeks. Per pt, vaginal bleeding is normal but has worsened over past two weeks. Pt is a poor historian. States she fell onto her knees x 1 week ago and is now non-weight bearing. Denies chest pain, shortness of breath.

## 2017-06-19 NOTE — ED Notes (Signed)
XR at bedside

## 2017-06-19 NOTE — ED Notes (Signed)
Pt returned from CT °

## 2017-06-19 NOTE — ED Notes (Signed)
Spoke with lab about pt's blood draw and lab with collect the redraw.

## 2017-06-19 NOTE — ED Notes (Signed)
Pt to CT via stretcher with CT tech.

## 2017-06-19 NOTE — ED Notes (Signed)
Spoke with Dr Jannifer Franklin about pt's reaction to her levaquin. Will mark pts allergies and he will order new medication.

## 2017-06-19 NOTE — ED Notes (Signed)
ED Provider at bedside. 

## 2017-06-19 NOTE — ED Provider Notes (Signed)
[facility] Emergency Department Provider Note   ____________________________________________   I have reviewed the triage vital signs and the nursing notes.   HISTORY  Chief Complaint Vaginal Bleeding   History limited by: Dementia  HPI Theresa Blake is a 76 y.o. female who presents to the emergency department today because of concern for worsening vaginal bleeding. The patient states she has a history of vaginal bleeding. She has been seen by ob/gyn for this. She states that for the past two weeks it has been worse. The patient denies any pain with the bleeding. Per chart review the patient is on eliquis. Patient denies any shortness of breath.   Past Medical History:  Diagnosis Date  . Abnormal Pap smear of cervix   . Allergic rhinitis, cause unspecified   . Backache, unspecified   . Depression    mood disorder; ? bipolar  . Dermatomycosis, unspecified   . Diabetes mellitus type II   . Edema   . Foot fracture, right   . HLD (hyperlipidemia)   . HTN (hypertension)   . Hx pulmonary embolism    multiple  . Lumbar spinal stenosis   . Malignant neoplasm of corpus uteri, except isthmus ([place])   . Morbid obesity ([place])   . Other specified disease of hair and hair follicles   . Pain in joint, pelvic region and thigh   . Panic disorder without agoraphobia     Patient Active Problem List   Diagnosis Date Noted  . History of endometrial cancer   . Acute respiratory failure with hypoxia ([place])   . Multiple rib fractures 06/30/2016  . Broken ribs 06/30/2016  . Preop cardiovascular exam 03/23/2014  . Hypoxia 04/29/2013  . Spinal stenosis of lumbar region 04/12/2012  . Neck strain 11/05/2011  . Wound of right leg 11/05/2011  . Cellulitis and abscess of leg 10/13/2011  . Sinusitis 10/13/2011  . Vertigo 10/13/2011  . Other screening mammogram 03/15/2011  . BACK PAIN, LUMBAR 12/22/2010  . HIP PAIN, RIGHT 12/21/2010  . ALLERGIC RHINITIS 07/22/2010  .  Morbid obesity due to excess calories ([place]) 02/27/2010  . FUNGAL DERMATITIS 08/15/2009  . Hypothyroidism 07/13/2007  . Diabetes mellitus type 2 in obese ([place]) 03/24/2007  . Hyperlipidemia 03/24/2007  . PANIC DISORDER 03/24/2007  . Depression 03/24/2007  . Essential hypertension 03/24/2007  . FOLLICULITIS 93/79/0240  . BACK PAIN 03/24/2007  . EDEMA 03/24/2007  . PULMONARY EMBOLISM, HX OF 03/24/2007    Past Surgical History:  Procedure Laterality Date  . BREAST BIOPSY     Right-benign  . ENDOMETRIAL BIOPSY  08/2006   attempted  . TONSILLECTOMY    . TUBAL LIGATION      Prior to Admission medications   Medication Sig Start Date End Date Taking? Authorizing Provider  acetaminophen (TYLENOL) 500 MG tablet Take 1,000 mg by mouth every 6 (six) hours as needed for mild pain, moderate pain, fever or headache.    [provider]  b complex vitamins tablet Take 1 tablet by mouth daily.    [provider]  Blood Glucose Monitoring Suppl (ACCU-CHEK AVIVA PLUS) W/DEVICE KIT by Does not apply route.    [provider]  buPROPion (WELLBUTRIN XL) 150 MG 24 hr tablet Take 450 mg by mouth daily.     [provider]  clonazePAM (KLONOPIN) 1 MG tablet Take 1 mg by mouth 3 (three) times daily. For anxiety    [provider]  ezetimibe-simvastatin (VYTORIN) 10-20 MG tablet Take 1 tablet  by mouth daily.    [provider]  fluticasone (FLONASE) 50 MCG/ACT nasal spray Place 2 sprays into both nostrils daily.    [provider]  furosemide (LASIX) 20 MG tablet Take 20 mg by mouth daily. 06/17/16   [provider]  gabapentin (NEURONTIN) 300 MG capsule Take 1 capsule (300 mg total) by mouth 3 (three) times daily. 07/02/16   Barton Dubois, MD  glucose blood (ACCU-CHEK AVIVA PLUS) test strip 1 each by Other route as needed for other. Use as instructed    [provider]  ibuprofen (ADVIL,MOTRIN) 800 MG tablet Take 1 tablet (800 mg  total) by mouth every 8 (eight) hours as needed for fever, headache, mild pain or moderate pain. 07/02/16   Barton Dubois, MD  levothyroxine (SYNTHROID, LEVOTHROID) 50 MCG tablet Take 1 tablet (50 mcg total) by mouth daily before breakfast. 02/28/14   Einar Pheasant, MD  lisinopril (PRINIVIL,ZESTRIL) 20 MG tablet TAKE 1 TABLET BY MOUTH EVERY DAY 04/12/14   Einar Pheasant, MD  Lysine 500 MG TABS Take 1 tablet by mouth daily.    [provider]  methocarbamol (ROBAXIN) 500 MG tablet Take 1 tablet (500 mg total) by mouth every 8 (eight) hours as needed for muscle spasms. 07/02/16   Barton Dubois, MD  Multiple Vitamin (MULTIVITAMIN) tablet Take 1 tablet by mouth daily.      [provider]  nystatin (MYCOSTATIN/NYSTOP) 100000 UNIT/GM POWD Sprinkle powder to the affected rash daily 01/25/15   Janne Napoleon, NP  nystatin cream (MYCOSTATIN) Apply 1 application topically 3 (three) times daily as needed for rash. 06/03/16   [provider]  omeprazole (PRILOSEC) 20 MG capsule TAKE ONE CAPSULE BY MOUTH EVERY DAY *NEEDS OFFICE VISITNicki Reaper, Charlene, MD  senna (SENOKOT) 8.6 MG TABS tablet Take 1 tablet by mouth daily as needed for mild constipation.    [provider]  venlafaxine (EFFEXOR-XR) 150 MG 24 hr capsule Take 300 mg by mouth daily.      [provider]  warfarin (COUMADIN) 3 MG tablet Take 3 mg by mouth 2 (two) times a week. Takes on Sunday and Saturday    [provider]  warfarin (COUMADIN) 4 MG tablet Take 4 mg by mouth See admin instructions. Takes on Monday thru Friday    [provider]    Allergies Anesthetics, halogenated; Tramadol; and Lamotrigine  Family History  Problem Relation Age of Onset  . Heart attack Father 63  . Pancreatic cancer Mother        with mets  . Hyperlipidemia Son   . Hyperlipidemia Son   . Gout Son     Social History Social History  Substance Use Topics  . Smoking status: Never Smoker  . Smokeless  tobacco: Never Used  . Alcohol use No    Review of Systems Constitutional: No fever/chills Eyes: No visual changes. ENT: No sore throat. Cardiovascular: Denies chest pain. Respiratory: Denies shortness of breath. Gastrointestinal: No abdominal pain.  No nausea, no vomiting.  No diarrhea.   Genitourinary: Positive for vaginal bleeding.  Musculoskeletal: Negative for back pain. Skin: Negative for rash. Neurological: Negative for headaches, focal weakness or numbness.  ____________________________________________   PHYSICAL EXAM:  VITAL SIGNS: ED Triage Vitals  Enc Vitals Group     BP 06/19/17 1651 (!) 158/108     Pulse Rate 06/19/17 1651 (!) 106     Resp 06/19/17 1651 (!) 22     Temp 06/19/17 1651 98.4 F (36.9  C)     Temp Source 06/19/17 1651 Oral     SpO2 06/19/17 1651 92 %     Weight 06/19/17 1655 300 lb (136.1 kg)     Height 06/19/17 1655 '5\' 7"'  (1.702 m)    Constitutional: Awake and alert. Not completely oriented to location or events.  Eyes: Conjunctivae are normal.  ENT   Head: Normocephalic and atraumatic.   Nose: No congestion/rhinnorhea.   Mouth/Throat: Mucous membranes are moist.   Neck: No stridor. Hematological/Lymphatic/Immunilogical: No cervical lymphadenopathy. Cardiovascular: Tachycardic, regular rhythm.  No murmurs, rubs, or gallops.  Respiratory: Normal respiratory effort without tachypnea nor retractions. Breath sounds are clear and equal bilaterally. No wheezes/rales/rhonchi. Gastrointestinal: Soft and non tender. No rebound. No guarding.  Genitourinary: Deferred Musculoskeletal: Normal range of motion in all extremities. No lower extremity edema. Neurologic:  Normal speech and language. No gross focal neurologic deficits are appreciated.  Skin:  Skin is warm, dry and intact. No rash noted. Psychiatric: Mood and affect are normal. Speech and behavior are normal. Patient exhibits appropriate insight and  judgment.  ____________________________________________    LABS (pertinent positives/negatives)  Labs Reviewed  CBC WITH DIFFERENTIAL/PLATELET - Abnormal; Notable for the following:       Result Value   RBC 5.33 (*)    Hemoglobin 16.3 (*)    HCT 48.9 (*)    RDW 14.6 (*)    Neutro Abs 7.0 (*)    All other components within normal limits  BASIC METABOLIC PANEL - Abnormal; Notable for the following:    Glucose, Bld 169 (*)    Calcium 10.4 (*)    All other components within normal limits  CULTURE, BLOOD (ROUTINE X 2)  CULTURE, BLOOD (ROUTINE X 2)  TROPONIN I  BRAIN NATRIURETIC PEPTIDE  TYPE AND SCREEN  TYPE AND SCREEN     ____________________________________________   EKG  Apolonio Schneiders, attending physician, personally viewed and interpreted this EKG  EKG Time: 2128 Rate: 103 Rhythm: sinus tachycardia Axis: normal Intervals: qtc 456 QRS: low voltage, q waves V1 ST changes: no st elevation Impression: abnormal ekg   ____________________________________________    RADIOLOGY  CXR IMPRESSION:  No active disease. Cardiomegaly with mild central congestion.    ____________________________________________   PROCEDURES  Procedures  ____________________________________________   INITIAL IMPRESSION / ASSESSMENT AND PLAN / ED COURSE  Pertinent labs & imaging results that were available during my care of the patient were reviewed by me and considered in my medical decision making (see chart for details).  Patient presented to the emergency department today because of concerns for vaginal bleeding. Her hemoglobin levels normal. She states she has a known history of endometrial cancer. Of more concern perhaps is the fact the patient was hypoxic. She was in the mid 80s on room air. Chest x-ray and CT angiography were performed without clear etiology of the patient's hypoxic. There was question of an infectious process so patient will be given Levaquin. Will  plan on admission to the hospital service for further workup and evaluation. ____________________________________________   FINAL CLINICAL IMPRESSION(S) / ED DIAGNOSES  Final diagnoses:  Hypoxia     Note: This dictation was prepared with Dragon dictation. Any transcriptional errors that result from this process are unintentional     Nance Pear, MD 06/19/17 2159

## 2017-06-19 NOTE — ED Notes (Signed)
Patient's oxygen saturation declined to 82% on room air.  Patient denies use of O2 at home.  Patient denies shortness of breath or difficulty breathing.  Patient placed on 4 L nasal cannula and is tolerating it well at this time.

## 2017-06-20 DIAGNOSIS — J9601 Acute respiratory failure with hypoxia: Secondary | ICD-10-CM | POA: Diagnosis not present

## 2017-06-20 LAB — GLUCOSE, CAPILLARY
GLUCOSE-CAPILLARY: 160 mg/dL — AB (ref 65–99)
GLUCOSE-CAPILLARY: 181 mg/dL — AB (ref 65–99)
GLUCOSE-CAPILLARY: 183 mg/dL — AB (ref 65–99)
Glucose-Capillary: 172 mg/dL — ABNORMAL HIGH (ref 65–99)
Glucose-Capillary: 164 mg/dL — ABNORMAL HIGH (ref 65–99)

## 2017-06-20 LAB — CBC
HEMATOCRIT: 45.5 % (ref 35.0–47.0)
Hemoglobin: 15.1 g/dL (ref 12.0–16.0)
MCH: 30.8 pg (ref 26.0–34.0)
MCHC: 33.1 g/dL (ref 32.0–36.0)
MCV: 93.1 fL (ref 80.0–100.0)
Platelets: 296 10*3/uL (ref 150–440)
RBC: 4.89 MIL/uL (ref 3.80–5.20)
RDW: 14.8 % — AB (ref 11.5–14.5)
WBC: 9.1 10*3/uL (ref 3.6–11.0)

## 2017-06-20 LAB — BASIC METABOLIC PANEL
Anion gap: 8 (ref 5–15)
BUN: 13 mg/dL (ref 6–20)
CHLORIDE: 101 mmol/L (ref 101–111)
CO2: 30 mmol/L (ref 22–32)
CREATININE: 0.58 mg/dL (ref 0.44–1.00)
Calcium: 10 mg/dL (ref 8.9–10.3)
GFR calc Af Amer: >60 mL/min (ref >60)
GFR calc non Af Amer: >60 mL/min (ref >60)
GLUCOSE: 162 mg/dL — AB (ref 65–99)
POTASSIUM: 3.7 mmol/L (ref 3.5–5.1)
Sodium: 139 mmol/L (ref 135–145)

## 2017-06-20 LAB — TSH: TSH: 3.743 u[IU]/mL (ref 0.350–4.500)

## 2017-06-20 LAB — MRSA PCR SCREENING: MRSA by PCR: POSITIVE — AB

## 2017-06-20 LAB — BRAIN NATRIURETIC PEPTIDE: B Natriuretic Peptide: 39 pg/mL (ref 0.0–100.0)

## 2017-06-20 MED ORDER — AZITHROMYCIN 500 MG PO TABS
250.0000 mg | ORAL_TABLET | Freq: Every day | ORAL | Status: DC
  Administered 2017-06-20 – 2017-06-22 (×3): 250 mg via ORAL
  Filled 2017-06-20 (×3): qty 1

## 2017-06-20 MED ORDER — APIXABAN 2.5 MG PO TABS
2.5000 mg | ORAL_TABLET | Freq: Two times a day (BID) | ORAL | Status: DC
  Administered 2017-06-20 – 2017-06-21 (×4): 2.5 mg via ORAL
  Filled 2017-06-20 (×4): qty 1

## 2017-06-20 MED ORDER — MUPIROCIN 2 % EX OINT
1.0000 "application " | TOPICAL_OINTMENT | Freq: Two times a day (BID) | CUTANEOUS | Status: DC
  Administered 2017-06-20 – 2017-06-22 (×5): 1 via NASAL
  Filled 2017-06-20: qty 22

## 2017-06-20 MED ORDER — ORAL CARE MOUTH RINSE
15.0000 mL | Freq: Two times a day (BID) | OROMUCOSAL | Status: DC
  Administered 2017-06-20 – 2017-06-22 (×5): 15 mL via OROMUCOSAL

## 2017-06-20 MED ORDER — CHLORHEXIDINE GLUCONATE CLOTH 2 % EX PADS
6.0000 | MEDICATED_PAD | Freq: Every day | CUTANEOUS | Status: DC
  Administered 2017-06-20 – 2017-06-22 (×3): 6 via TOPICAL

## 2017-06-20 MED ORDER — POTASSIUM CHLORIDE CRYS ER 20 MEQ PO TBCR
40.0000 meq | EXTENDED_RELEASE_TABLET | Freq: Once | ORAL | Status: AC
  Administered 2017-06-20: 40 meq via ORAL
  Filled 2017-06-20: qty 2

## 2017-06-20 MED ORDER — PNEUMOCOCCAL VAC POLYVALENT 25 MCG/0.5ML IJ INJ
0.5000 mL | INJECTION | INTRAMUSCULAR | Status: AC
  Administered 2017-06-21: 0.5 mL via INTRAMUSCULAR
  Filled 2017-06-20: qty 0.5

## 2017-06-20 MED ORDER — FUROSEMIDE 10 MG/ML IJ SOLN
40.0000 mg | Freq: Two times a day (BID) | INTRAMUSCULAR | Status: DC
  Administered 2017-06-20 (×2): 40 mg via INTRAVENOUS
  Filled 2017-06-20 (×2): qty 4

## 2017-06-20 NOTE — Care Management Obs Status (Signed)
[place] NOTIFICATION   Patient Details  Name: Theresa Blake MRN: [id number] Date of Birth: Mar 17, 1941   Medicare Observation Status Notification Given:  Yes    Shelbie Ammons, RN 06/20/2017, 10:23 AM

## 2017-06-20 NOTE — Care Management Note (Signed)
Case Management Note  Patient Details  Name: Theresa Blake MRN: [id number] Date of Birth: 02-18-1941  Subjective/Objective:     Admitted to [place] under observation status with the diagnosis of hypoxia. Son, [name] lives in the home. [phone]). Seen Dr. Ginette Pitman about a year ago. Prescriptions are filled at [facility] in [place]. Home Health per [facility] September 2017. No skilled nursing facility. "Oxygen in the home, but they came and got it". Wheelchair, rolling walker, and bedside commode in  The home. Self feeds. Son helps with dressing and baths. Last fall was September 2017. Good appetite. Family will transport                Action/Plan: Will continue to follow for discharge plans   Expected Discharge Date:                  Expected Discharge Plan:     In-House Referral:     Discharge planning Services     Post Acute Care Choice:    Choice offered to:     DME Arranged:    DME Agency:     HH Arranged:    [place] Agency:     Status of Service:     If discussed at [employer], dates discussed:    Additional Comments:  Shelbie Ammons, [place] [facility] [phone] 06/20/2017, 10:24 AM

## 2017-06-20 NOTE — Progress Notes (Signed)
Medications administered by student RN [id number] with supervision of Clinical Instructor Zaide Kardell MSN, RN-BC.  

## 2017-06-20 NOTE — Progress Notes (Signed)
Pt. Placed on autoCPAP via Medium FFM  with O2 @ 3LPM bleed-in. Pt. Tolerating well. Will continue to assess.

## 2017-06-20 NOTE — Progress Notes (Signed)
Pt declined cpap. States she does not use one at home, only O2. She prefers to wait until she has sleep study.

## 2017-06-20 NOTE — Progress Notes (Addendum)
[facility] [place] NAME: Theresa Blake    MR#:  [id number]  DATE OF BIRTH:  08-18-1941  SUBJECTIVE:  CHIEF COMPLAINT:   Chief Complaint  Patient presents with  . Vaginal Bleeding   Still has vaginal bleeding. No SOB Has used Oxygen at home in the past Waiting for sleep study as OP  REVIEW OF SYSTEMS:    Review of Systems  Constitutional: Positive for malaise/fatigue. Negative for chills and fever.  HENT: Negative for sore throat.   Eyes: Negative for blurred vision, double vision and pain.  Respiratory: Negative for cough, hemoptysis, shortness of breath and wheezing.   Cardiovascular: Negative for chest pain, palpitations, orthopnea and leg swelling.  Gastrointestinal: Negative for abdominal pain, constipation, diarrhea, heartburn, nausea and vomiting.  Genitourinary: Negative for dysuria and hematuria.  Musculoskeletal: Negative for back pain and joint pain.  Skin: Negative for rash.  Neurological: Positive for weakness. Negative for sensory change, speech change, focal weakness and headaches.  Endo/Heme/Allergies: Does not bruise/bleed easily.  Psychiatric/Behavioral: Negative for depression. The patient is not nervous/anxious.     DRUG ALLERGIES:   Allergies  Allergen Reactions  . Anesthetics, Halogenated Shortness Of Breath  . Levaquin [Levofloxacin In D5w] Itching  . Tramadol Other (See Comments)    Reaction: unknown  . Lamotrigine Itching    VITALS:  Blood pressure (!) 128/59, pulse (!) 103, temperature 97.9 F (36.6 C), temperature source Oral, resp. rate 20, height 5\' 7"  (1.702 m), weight (!) 148 kg (326 lb 3.2 oz), SpO2 93 %.  PHYSICAL EXAMINATION:   Physical Exam  GENERAL:  76 y.o.-year-old patient lying in the bed with no acute distress. Morbidly obese EYES: Pupils equal, round, reactive to light and accommodation. No scleral icterus. Extraocular muscles intact.  HEENT: Head atraumatic, normocephalic.  Oropharynx and nasopharynx clear.  NECK:  Supple, no jugular venous distention. No thyroid enlargement, no tenderness.  LUNGS: Normal breath sounds bilaterally, no wheezing, rales, rhonchi. No use of accessory muscles of respiration.  CARDIOVASCULAR: S1, S2 normal. No murmurs, rubs, or gallops.  ABDOMEN: Soft, nontender, nondistended. Bowel sounds present. No organomegaly or mass.  EXTREMITIES: No cyanosis, clubbing or edema b/l.    NEUROLOGIC: Cranial nerves II through XII are intact. No focal Motor or sensory deficits b/l.   PSYCHIATRIC: The patient is drowzy. But oriented x 3  SKIN: No obvious rash, lesion, or ulcer.   LABORATORY PANEL:   CBC  Recent Labs Lab 06/20/17 0326  WBC 9.1  HGB 15.1  HCT 45.5  PLT 296   ------------------------------------------------------------------------------------------------------------------ Chemistries   Recent Labs Lab 06/20/17 0326  NA 139  K 3.7  CL 101  CO2 30  GLUCOSE 162*  BUN 13  CREATININE 0.58  CALCIUM 10.0   ------------------------------------------------------------------------------------------------------------------  Cardiac Enzymes  Recent Labs Lab 06/19/17 1720  TROPONINI <0.03   ------------------------------------------------------------------------------------------------------------------  RADIOLOGY:  Ct Angio Chest Pe W And/or Wo Contrast  Result Date: 06/19/2017 CLINICAL DATA:  Oxygen desaturation. History of pulmonary embolism, rib fractures, endometrial cancer. EXAM: CT ANGIOGRAPHY CHEST WITH CONTRAST TECHNIQUE: Multidetector CT imaging of the chest was performed using the standard protocol during bolus administration of intravenous contrast. Multiplanar CT image reconstructions and MIPs were obtained to evaluate the vascular anatomy. CONTRAST:  75 cc Isovue 370 COMPARISON:  Chest radiograph June 19, 2017 and CT chest September 29, 2016 FINDINGS: CARDIOVASCULAR: Adequate contrast opacification of the  pulmonary artery's. Main pulmonary artery is not enlarged. No pulmonary arterial filling defects to the  level of the subsegmental branches. Heart size is mildly enlarged. No pericardial effusion. Thoracic aorta is normal course and caliber, mild calcific atherosclerosis. MEDIASTINUM/NODES: No lymphadenopathy by CT size criteria. LUNGS/PLEURA: Tracheobronchial tree is patent, no pneumothorax. No pleural effusions, focal consolidations, pulmonary nodules or masses. Mild heterogeneous lung attenuation. UPPER ABDOMEN: Included view of the abdomen is nonsuspicious. Calcified hepatic granulomas. MUSCULOSKELETAL: Visualized soft tissues and included osseous structures are nonacute. Probable anterior abdominal wall ligamentous laxity, incompletely assessed. Old LEFT anterior rib fractures. Mild degenerative change of thoracic spine. Review of the MIP images confirms the above findings. IMPRESSION: 1. No acute pulmonary embolism. 2. Heterogeneous lung attenuation seen with pulmonary edema and/or small airway disease/infection. Aortic Atherosclerosis (ICD10-I70.0). Electronically Signed   By: Elon Alas M.D.   On: 06/19/2017 20:40   Dg Chest Portable 1 View  Result Date: 06/19/2017 CLINICAL DATA:  Increased vaginal bleeding EXAM: PORTABLE CHEST 1 VIEW COMPARISON:  07/01/2016 FINDINGS: Mild cardiomegaly. No focal infiltrate or effusion. Stable mediastinal silhouette. No pneumothorax. Nodular opacity projecting over the left lung apex, possibly due to bony summation shadow, similar appearance noted on scout image CT chest September 2017 without parenchymal correlate. IMPRESSION: No active disease.  Cardiomegaly with mild central congestion. Electronically Signed   By: Donavan Foil M.D.   On: 06/19/2017 18:17     ASSESSMENT AND PLAN:   * Acute hypoxic respiratory failure Likely obesity hypoventilation syndrome. Morbidly obese. Ct chest suggests some interstitial changes. Bronchitis? CHF? Check Echo.  Continue Azithromycin. Will need Sleep study and CPAP as OP. Start Lasix IV Wean O2 as tolerated  * Vaginal bleeding Stable HB Check labs again Discussed with Dr. Leonides Schanz with gynecology who will see the patient  * HTN Continue home meds  * DM SSI  * Hypothyroidism Check TSH Continue Levothyroxine  All the records are reviewed and case discussed with Care Management/Social Worker Management plans discussed with the patient, family and they are in agreement.  CODE STATUS: FULL CODE  DVT Prophylaxis: SCDs  TOTAL TIME TAKING CARE OF THIS PATIENT: 35 minutes.   POSSIBLE D/C IN 1-2 DAYS, DEPENDING ON CLINICAL CONDITION.  Hillary Bow R M.D on 06/20/2017 at 7:44 PM  Between 7am to 6pm - Pager - [phone]  After 6pm go to [email or web address] - password [email or web address] [facility]  Office  [phone]  CC: Primary care physician; Katheren Shams  Note: This dictation was prepared with Dragon dictation along with smaller phrase technology. Any transcriptional errors that result from this process are unintentional.

## 2017-06-21 ENCOUNTER — Observation Stay: Payer: Medicare Other

## 2017-06-21 ENCOUNTER — Observation Stay (HOSPITAL_BASED_OUTPATIENT_CLINIC_OR_DEPARTMENT_OTHER)
Admit: 2017-06-21 | Discharge: 2017-06-21 | Disposition: A | Discharge status: Home / Self Care | Payer: Medicare Other | Attending: Internal Medicine | Admitting: Internal Medicine

## 2017-06-21 DIAGNOSIS — I5031 Acute diastolic (congestive) heart failure: Secondary | ICD-10-CM

## 2017-06-21 DIAGNOSIS — J9601 Acute respiratory failure with hypoxia: Secondary | ICD-10-CM | POA: Diagnosis not present

## 2017-06-21 LAB — BASIC METABOLIC PANEL
Anion gap: 6 (ref 5–15)
BUN: 25 mg/dL — AB (ref 6–20)
CALCIUM: 10.1 mg/dL (ref 8.9–10.3)
CO2: 29 mmol/L (ref 22–32)
Chloride: 101 mmol/L (ref 101–111)
Creatinine, Ser: 1.18 mg/dL — ABNORMAL HIGH (ref 0.44–1.00)
GFR calc Af Amer: 51 mL/min — ABNORMAL LOW (ref >60)
GFR, EST NON AFRICAN AMERICAN: 44 mL/min — AB (ref >60)
GLUCOSE: 170 mg/dL — AB (ref 65–99)
POTASSIUM: 4.5 mmol/L (ref 3.5–5.1)
Sodium: 136 mmol/L (ref 135–145)

## 2017-06-21 LAB — ECHOCARDIOGRAM COMPLETE
HEIGHTINCHES: 67 in
Weight: 5312 oz

## 2017-06-21 LAB — HEMOGLOBIN: Hemoglobin: 15.4 g/dL (ref 12.0–16.0)

## 2017-06-21 LAB — GLUCOSE, CAPILLARY
GLUCOSE-CAPILLARY: 224 mg/dL — AB (ref 65–99)
GLUCOSE-CAPILLARY: 146 mg/dL — AB (ref 65–99)
Glucose-Capillary: 181 mg/dL — ABNORMAL HIGH (ref 65–99)
Glucose-Capillary: 157 mg/dL — ABNORMAL HIGH (ref 65–99)
Glucose-Capillary: 175 mg/dL — ABNORMAL HIGH (ref 65–99)

## 2017-06-21 MED ORDER — DIPHENHYDRAMINE HCL 25 MG PO CAPS
25.0000 mg | ORAL_CAPSULE | Freq: Every evening | ORAL | Status: DC | PRN
  Filled 2017-06-21: qty 1

## 2017-06-21 MED ORDER — CLONAZEPAM 0.5 MG PO TABS
1.0000 mg | ORAL_TABLET | Freq: Three times a day (TID) | ORAL | Status: DC
  Administered 2017-06-21: 1 mg via ORAL
  Filled 2017-06-21: qty 2

## 2017-06-21 MED ORDER — IOPAMIDOL (ISOVUE-300) INJECTION 61%
15.0000 mL | INTRAVENOUS | Status: AC
  Administered 2017-06-21 (×2): 15 mL via ORAL

## 2017-06-21 MED ORDER — IOPAMIDOL (ISOVUE-300) INJECTION 61%
100.0000 mL | Freq: Once | INTRAVENOUS | Status: AC | PRN
  Administered 2017-06-21: 100 mL via INTRAVENOUS

## 2017-06-21 NOTE — Progress Notes (Signed)
*  PRELIMINARY RESULTS* Echocardiogram 2D Echocardiogram has been performed.  Theresa Blake 06/21/2017, 9:31 AM

## 2017-06-21 NOTE — Progress Notes (Signed)
Obstetric and Gynecology  HD # 3  Subjective   Still having vaginal bleeding does not know if worse or better   Objective  Objective:   Vitals:   06/21/17 0432 06/21/17 0500 06/21/17 0925 06/21/17 1311  BP: (!) 146/60  (!) 148/74 99/81  Pulse: 93  92 81  Resp: 20   18  Temp: 98.6 F (37 C)   (!) 97.5 F (36.4 C)  TempSrc: Oral  Oral Oral  SpO2: 95%  96% 93%  Weight:  (!) 150.6 kg (332 lb)    Height:        General: NAD Cardiovascular: RRR Pulmonary: CTAB Abdomen: Benign. Non-tender, +BS, no guarding.  Labs: Results for orders placed or performed during the hospital encounter of 06/19/17 (from the past 24 hour(s))  Glucose, capillary     Status: Abnormal   Collection Time: 06/20/17  4:47 PM  Result Value Ref Range   Glucose-Capillary 181 (H) 65 - 99 mg/dL  Glucose, capillary     Status: Abnormal   Collection Time: 06/20/17  8:38 PM  Result Value Ref Range   Glucose-Capillary 183 (H) 65 - 99 mg/dL  Basic metabolic panel     Status: Abnormal   Collection Time: 06/21/17  5:07 AM  Result Value Ref Range   Sodium 136 135 - 145 mmol/L   Potassium 4.5 3.5 - 5.1 mmol/L   Chloride 101 101 - 111 mmol/L   CO2 29 22 - 32 mmol/L   Glucose, Bld 170 (H) 65 - 99 mg/dL   BUN 25 (H) 6 - 20 mg/dL   Creatinine, Ser 1.18 (H) 0.44 - 1.00 mg/dL   Calcium 10.1 8.9 - 10.3 mg/dL   GFR calc non Af Amer 44 (L) >60 mL/min   GFR calc Af Amer 51 (L) >60 mL/min   Anion gap 6 5 - 15  Hemoglobin     Status: None   Collection Time: 06/21/17  5:07 AM  Result Value Ref Range   Hemoglobin 15.4 12.0 - 16.0 g/dL  Glucose, capillary     Status: Abnormal   Collection Time: 06/21/17  7:17 AM  Result Value Ref Range   Glucose-Capillary 181 (H) 65 - 99 mg/dL  Glucose, capillary     Status: Abnormal   Collection Time: 06/21/17 11:26 AM  Result Value Ref Range   Glucose-Capillary 157 (H) 65 - 99 mg/dL    Cultures: Results for orders placed or performed during the hospital encounter of 06/19/17   Blood culture (routine x 2)     Status: None (Preliminary result)   Collection Time: 06/19/17  9:15 PM  Result Value Ref Range Status   Specimen Description BLOOD LEFT HAND  Final   Special Requests   Final    BOTTLES DRAWN AEROBIC AND ANAEROBIC Blood Culture adequate volume   Culture NO GROWTH 2 DAYS  Final   Report Status PENDING  Incomplete  Blood culture (routine x 2)     Status: None (Preliminary result)   Collection Time: 06/19/17  9:40 PM  Result Value Ref Range Status   Specimen Description BLOOD LEFT ANTECUBITAL  Final   Special Requests   Final    BOTTLES DRAWN AEROBIC AND ANAEROBIC Blood Culture adequate volume   Culture NO GROWTH 2 DAYS  Final   Report Status PENDING  Incomplete  MRSA PCR Screening     Status: Abnormal   Collection Time: 06/20/17  1:43 AM  Result Value Ref Range Status   MRSA by PCR POSITIVE (A)  NEGATIVE Final    Comment:        The GeneXpert MRSA Assay (FDA approved for NASAL specimens only), is one component of a comprehensive MRSA colonization surveillance program. It is not intended to diagnose MRSA infection nor to guide or monitor treatment for MRSA infections. RESULT CALLED TO, READ BACK BY AND VERIFIED WITH: JACKIE PAGE ON 06/20/17 AT 1660 QSD     Imaging: Ct Angio Chest Pe W And/or Wo Contrast  Result Date: 06/19/2017 CLINICAL DATA:  Oxygen desaturation. History of pulmonary embolism, rib fractures, endometrial cancer. EXAM: CT ANGIOGRAPHY CHEST WITH CONTRAST TECHNIQUE: Multidetector CT imaging of the chest was performed using the standard protocol during bolus administration of intravenous contrast. Multiplanar CT image reconstructions and MIPs were obtained to evaluate the vascular anatomy. CONTRAST:  75 cc Isovue 370 COMPARISON:  Chest radiograph June 19, 2017 and CT chest September 29, 2016 FINDINGS: CARDIOVASCULAR: Adequate contrast opacification of the pulmonary artery's. Main pulmonary artery is not enlarged. No pulmonary arterial  filling defects to the level of the subsegmental branches. Heart size is mildly enlarged. No pericardial effusion. Thoracic aorta is normal course and caliber, mild calcific atherosclerosis. MEDIASTINUM/NODES: No lymphadenopathy by CT size criteria. LUNGS/PLEURA: Tracheobronchial tree is patent, no pneumothorax. No pleural effusions, focal consolidations, pulmonary nodules or masses. Mild heterogeneous lung attenuation. UPPER ABDOMEN: Included view of the abdomen is nonsuspicious. Calcified hepatic granulomas. MUSCULOSKELETAL: Visualized soft tissues and included osseous structures are nonacute. Probable anterior abdominal wall ligamentous laxity, incompletely assessed. Old LEFT anterior rib fractures. Mild degenerative change of thoracic spine. Review of the MIP images confirms the above findings. IMPRESSION: 1. No acute pulmonary embolism. 2. Heterogeneous lung attenuation seen with pulmonary edema and/or small airway disease/infection. Aortic Atherosclerosis (ICD10-I70.0). Electronically Signed   By: Elon Alas M.D.   On: 06/19/2017 20:40   Ct Pelvis W Contrast  Result Date: 06/21/2017 CLINICAL DATA:  76 year old female with history of endometrial carcinoma. Vaginal bleeding. EXAM: CT PELVIS WITH CONTRAST TECHNIQUE: Multidetector CT imaging of the pelvis was performed using the standard protocol following the bolus administration of intravenous contrast. CONTRAST:  125mL ISOVUE-300 IOPAMIDOL (ISOVUE-300) INJECTION 61% COMPARISON:  MRI of the pelvis 03/26/2014. FINDINGS: Urinary Tract: Contrast opacification of urine in the urinary bladder. Distal ureters and urinary bladder are otherwise unremarkable in appearance. Bowel: Visualized portions of small bowel, colon and rectum are unremarkable in appearance. 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 No lymphadenopathy noted in the visualized  pelvis. Reproductive: IUD present in the endometrial canal. Endometrium measures up to 16 mm in thickness in the fundal region. Heterogeneous enhancement of the myometrium, particularly anterior aspect of the uterine fundus where there are few punctate calcifications. This entire area measures approximately 6.5 x 2.8 x 4.8 cm (axial image 53 of series 3 and sagittal image 92 of series 9). 16 mm calcified lesion in the anterior aspect of the lower uterine segment adjacent to the cervix, presumably a small fibroid. Left ovary is atrophic. 2.8 x 2.5 x 2.8 cm low-attenuation lesion in the right ovary. Other: No significant volume of ascites and no pneumoperitoneum noted in the visualized portions of the peritoneal cavity. Musculoskeletal: There are no aggressive appearing lytic or blastic lesions noted in the visualized portions of the skeleton. IMPRESSION: 1. Mass-like area of heterogeneous enhancement and punctate calcifications in the anterior aspect of the uterine fundus appears to be new compared to prior MRI of the pelvis  03/26/2014, presumably related to progression of disease with myometrial invasion. Endometrium in the fundal region is also thicker than previously noted, currently measuring up to 16 mm, now with an IUD in place. 2. Previously noted cystic right ovarian lesion is smaller than previously new seen, currently measuring 2.8 x 2.5 x 2.8 cm, presumably benign. 3. Aortic atherosclerosis. 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 Electronically Signed   By: Vinnie Langton M.D.   On: 06/21/2017 10:37   Dg Chest Portable 1 View  Result Date: 06/19/2017 CLINICAL DATA:  Increased vaginal bleeding EXAM: PORTABLE CHEST 1 VIEW COMPARISON:  07/01/2016 FINDINGS: Mild cardiomegaly. No focal infiltrate or effusion. Stable mediastinal silhouette. No pneumothorax. Nodular opacity projecting over the left lung apex, possibly due to bony summation  shadow, similar appearance noted on scout image CT chest September 2017 without parenchymal correlate. IMPRESSION: No active disease.  Cardiomegaly with mild central congestion. Electronically Signed   By: Donavan Foil M.D.   On: 06/19/2017 18:17     Assessment   76 y.o.  Hospital Day: 3   Plan   1. Needs to have an exam and GYN ONC follow up - can see [place] or [place] at [facility] on wednesdays, but may have to go to the [facility] in [place]. 2. Continue to monitor bleeding pattern with pad counts  ----- Larey Days, MD Attending Obstetrician and Gynecologist [facility], Department of [facility]

## 2017-06-21 NOTE — Care Management (Signed)
Qualifies for home oxygen. Nebulizer and inhalers were used for respiratory treatments. No respiratory improvement. Will need home oxygen. States she was diagnosed with COPD at [facility] will provide home oxygen and services in the home. [facility] will provide transportation Shelbie Ammons RN MSN [place] [facility] [phone]

## 2017-06-21 NOTE — Progress Notes (Signed)
SATURATION QUALIFICATIONS: (This note is used to comply with regulatory documentation for home oxygen)  Patient Saturations on Room Air at Rest = 79%  Patient Saturations on Room Air while Ambulating = 00%  Patient Saturations on 3 Liters of oxygen while Ambulating = 00%  Please briefly explain why patient needs home oxygen:  COPD, recovered to 92% on 3 liters patient can not ambulate

## 2017-06-21 NOTE — Consult Note (Addendum)
Consult History and Physical   SERVICE: Gynecology  Patient Name: Theresa Blake Patient MRN:   [id number]  CC: vaginal bleeding  Patient difficult historian: does not provide answers to review of systems, is self-deprecating, and has tangential stories to all questions.  Does not continue linear thoughts.    HPI: Theresa Blake is a morbidly obese  76 y.o. G4 with history of Grade 1 EC, s/p EBRT and SBRT due to inability to operate due to medical comorbidities, now with Mirena IUD in place. Originally a patient of Dr. Marcelline Mates at [facility] - apparently only saw her once, then Dr. Hazle Nordmann, and finally [place] from [facility], with last contact over a year ago 7/17.  She was offended at her last visit and decided not to return.  Did not seek a different physician or group.  Radiation oncologist: Dillon Bjork, last contact 8/17.  With Mirena still presumed to be in place she has had spotting almost daily but in the last couple weeks she began to bleed heavier. She  Presented to the ED yesterday with this concern, but had drops in her O2 saturation% on room air, and this was thought to be more pressing; she had a negative chest CT and was admitted to the medicine service.  The vaginal bleeding was not addressed.  She has a history of thromboembolic pulmonary hypertension, and is on Elliquis daily.  She is unsure when she started this medication, but per record it appears to be on or around 05/11/17. Prior to this she has been on Lovenox (too expensive) and Coumadin (patient disliked).   She is not able to tell me how much bleeding she has had, how it changed enough to bring her to the ED, nor when it started to change - how much she was bleeding before or after. She is bed-bound after falling out of her bed and hurting her knee.  Her son, Theresa Blake is her primary caretaker and was not present for this interview.  Per record he contacted Dr. Ginette Pitman (primary care) on 8/26 for vaginal  bleeding.    Review of Systems: positives in bold GEN:   fevers, chills, weight changes, appetite changes, fatigue, night sweats HEENT:  HA, vision changes, hearing loss, congestion, rhinorrhea, sinus pressure, dysphagia CV:   CP, palpitations PULM:  SOB, cough GI:  abd pain, N/V/D/C GU:  dysuria, urgency, frequency MSK:  arthralgias, myalgias, back pain, swelling SKIN:  rashes, color changes, pallor NEURO:  numbness, weakness, tingling, seizures, dizziness, tremors PSYCH:  depression, anxiety, behavioral problems, confusion  HEME/LYMPH:  easy bruising or bleeding ENDO:  heat/cold intolerance   Past Medical History: Past Medical History:  Diagnosis Date  . Abnormal Pap smear of cervix   . Allergic rhinitis, cause unspecified   . Backache, unspecified   . Depression    mood disorder; ? bipolar  . Dermatomycosis, unspecified   . Diabetes mellitus type II   . Edema   . Foot fracture, right   . HLD (hyperlipidemia)   . HTN (hypertension)   . Hx pulmonary embolism    multiple  . Lumbar spinal stenosis   . Malignant neoplasm of corpus uteri, except isthmus ([place])   . Morbid obesity ([place])   . Other specified disease of hair and hair follicles   . Pain in joint, pelvic region and thigh   . Panic disorder without agoraphobia     Past Surgical History:   Past Surgical History:  Procedure Laterality Date  . BREAST  BIOPSY     Right-benign  . ENDOMETRIAL BIOPSY  08/2006   attempted  . TONSILLECTOMY    . TUBAL LIGATION      Family History:  family history includes Gout in her son; Heart attack (age of onset: 56) in her father; Hyperlipidemia in her son and son; Pancreatic cancer in her mother.  Social History:  Social History   Social History  . Marital status: Widowed    Spouse name: N/A  . Number of children: 4  . Years of education: N/A   Occupational History  . Disabled    Social History Main Topics  . Smoking status: Never Smoker  . Smokeless tobacco:  Never Used  . Alcohol use No  . Drug use: No  . Sexual activity: Not on file   Other Topics Concern  . Not on file   Social History Narrative   Widow-husband died with MI, DM      4 sons      Disability      Compulsive overeater             Home Medications:  Medications reconciled in EPIC  No current facility-administered medications on file prior to encounter.    Current Outpatient Prescriptions on File Prior to Encounter  Medication Sig Dispense Refill  . acetaminophen (TYLENOL) 500 MG tablet Take 1,000 mg by mouth every 6 (six) hours as needed for mild pain, moderate pain, fever or headache.    . b complex vitamins tablet Take 1 tablet by mouth daily.    . Blood Glucose Monitoring Suppl (ACCU-CHEK AVIVA PLUS) W/DEVICE KIT by Does not apply route.    Marland Kitchen buPROPion (WELLBUTRIN XL) 150 MG 24 hr tablet Take 450 mg by mouth daily.     . clonazePAM (KLONOPIN) 1 MG tablet Take 1 mg by mouth 3 (three) times daily as needed for anxiety. For anxiety    . ezetimibe-simvastatin (VYTORIN) 10-20 MG tablet Take 1 tablet by mouth daily.    . fluticasone (FLONASE) 50 MCG/ACT nasal spray Place 2 sprays into both nostrils daily.    . furosemide (LASIX) 20 MG tablet Take 20 mg by mouth daily.    Marland Kitchen glucose blood (ACCU-CHEK AVIVA PLUS) test strip 1 each by Other route as needed for other. Use as instructed    . levothyroxine (SYNTHROID, LEVOTHROID) 50 MCG tablet Take 1 tablet (50 mcg total) by mouth daily before breakfast. 30 tablet 0  . lisinopril (PRINIVIL,ZESTRIL) 20 MG tablet TAKE 1 TABLET BY MOUTH EVERY DAY 90 tablet 1  . Multiple Vitamin (MULTIVITAMIN) tablet Take 1 tablet by mouth daily.      Marland Kitchen omeprazole (PRILOSEC) 20 MG capsule TAKE ONE CAPSULE BY MOUTH EVERY DAY *NEEDS OFFICE VISIT* 30 capsule 0  . venlafaxine (EFFEXOR-XR) 150 MG 24 hr capsule Take 300 mg by mouth daily.        Allergies:  Allergies  Allergen Reactions  . Anesthetics, Halogenated Shortness Of Breath  . Levaquin  [Levofloxacin In D5w] Itching  . Tramadol Other (See Comments)    Reaction: unknown  . Lamotrigine Itching    Physical Exam:  Temp:  [97.9 F (36.6 C)-98.6 F (37 C)] 98.6 F (37 C) (08/28 0432) Pulse Rate:  [92-103] 93 (08/28 0432) Resp:  [20] 20 (08/28 0432) BP: (128-161)/(56-98) 146/60 (08/28 0432) SpO2:  [93 %-95 %] 95 % (08/28 0432) Weight:  [150.6 kg (332 lb)] 150.6 kg (332 lb) (08/28 0500)   General Appearance:  no acute distress, alert, cooperative and  appears stated age, disheveled, bedbound, with nasal cannula applied HEENT:  Normocephalic atraumatic, extraocular movements intact, moist mucous membranes, neck supple with midline trachea and thyroid without masses Cardiovascular:  Normal S1/S2, regular rate and rhythm, no murmurs, 2+ distal pulses Pulmonary:  clear to auscultation, no wheezes, rales or rhonchi, symmetric air entry  Abdomen:  Bowel sounds present, soft, nontender, nondistended, no abnormal masses or organomegaly, no epigastric pain Back: deferred Extremities:  extremities normal, no tenderness, atraumatic, Skin:  normal coloration and turgor, no rashes  Neurologic:  Cranial nerves 2-12 grossly intact, grossly equal strength and muscle tone, normal speech, tremor bilateral upper extremities. Psychiatric:  Normal mood and affect, no AH/VH Pelvic:  Unable to be performed due to patient positioning and size.  There is some sort of suction apparatus placed against her vulva that is apparently used to collect urine.  The tubing is empty, and attached to wall suction with canister full of 500cc of iced-tea colored fluid.    Labs/Studies:   CBC and Coags:  Lab Results  Component Value Date   WBC 9.1 06/20/2017   NEUTOPHILPCT 71 06/19/2017   EOSPCT 2 06/19/2017   BASOPCT 1 06/19/2017   LYMPHOPCT 17 06/19/2017   HGB 15.4 06/21/2017   HCT 45.5 06/20/2017   MCV 93.1 06/20/2017   PLT 296 06/20/2017   INR 1.52 07/03/2016   CMP:  Lab Results  Component Value  Date   NA 136 06/21/2017   K 4.5 06/21/2017   CL 101 06/21/2017   CO2 29 06/21/2017   BUN 25 (H) 06/21/2017   CREATININE 1.18 (H) 06/21/2017   CREATININE 0.58 06/20/2017   CREATININE 0.65 06/19/2017   PROT 6.3 (L) 07/01/2016   BILITOT 0.6 07/01/2016   BILIDIR 0.1 03/06/2013   ALT 19 07/01/2016   AST 14 (L) 07/01/2016   ALKPHOS 72 07/01/2016   Other Labs: Results for orders placed or performed during the hospital encounter of 06/19/17 (from the past 48 hour(s))  CBC with Differential     Status: Abnormal   Collection Time: 06/19/17  5:20 PM  Result Value Ref Range   WBC 9.8 3.6 - 11.0 K/uL   RBC 5.33 (H) 3.80 - 5.20 MIL/uL   Hemoglobin 16.3 (H) 12.0 - 16.0 g/dL   HCT 48.9 (H) 35.0 - 47.0 %   MCV 91.8 80.0 - 100.0 fL   MCH 30.7 26.0 - 34.0 pg   MCHC 33.4 32.0 - 36.0 g/dL   RDW 14.6 (H) 11.5 - 14.5 %   Platelets 305 150 - 440 K/uL   Neutrophils Relative % 71 %   Neutro Abs 7.0 (H) 1.4 - 6.5 K/uL   Lymphocytes Relative 17 %   Lymphs Abs 1.6 1.0 - 3.6 K/uL   Monocytes Relative 9 %   Monocytes Absolute 0.9 0.2 - 0.9 K/uL   Eosinophils Relative 2 %   Eosinophils Absolute 0.2 0 - 0.7 K/uL   Basophils Relative 1 %   Basophils Absolute 0.1 0 - 0.1 K/uL  Troponin I     Status: None   Collection Time: 06/19/17  5:20 PM  Result Value Ref Range   Troponin I <0.03 <0.03 ng/mL  Basic metabolic panel     Status: Abnormal   Collection Time: 06/19/17  5:56 PM  Result Value Ref Range   Sodium 141 135 - 145 mmol/L   Potassium 4.2 3.5 - 5.1 mmol/L   Chloride 102 101 - 111 mmol/L   CO2 32 22 - 32 mmol/L  Glucose, Bld 169 (H) 65 - 99 mg/dL   BUN 12 6 - 20 mg/dL   Creatinine, Ser 0.65 0.44 - 1.00 mg/dL   Calcium 10.4 (H) 8.9 - 10.3 mg/dL   GFR calc non Af Amer >60 >60 mL/min   GFR calc Af Amer >60 >60 mL/min    Comment: (NOTE) The eGFR has been calculated using the CKD EPI equation. This calculation has not been validated in all clinical situations. eGFR's persistently <60 mL/min  signify possible Chronic Kidney Disease.    Anion gap 7 5 - 15  Blood culture (routine x 2)     Status: None (Preliminary result)   Collection Time: 06/19/17  9:15 PM  Result Value Ref Range   Specimen Description BLOOD LEFT HAND    Special Requests      BOTTLES DRAWN AEROBIC AND ANAEROBIC Blood Culture adequate volume   Culture NO GROWTH < 12 HOURS    Report Status PENDING   Blood culture (routine x 2)     Status: None (Preliminary result)   Collection Time: 06/19/17  9:40 PM  Result Value Ref Range   Specimen Description BLOOD LEFT ANTECUBITAL    Special Requests      BOTTLES DRAWN AEROBIC AND ANAEROBIC Blood Culture adequate volume   Culture NO GROWTH < 12 HOURS    Report Status PENDING   Glucose, capillary     Status: Abnormal   Collection Time: 06/19/17 10:52 PM  Result Value Ref Range   Glucose-Capillary 160 (H) 65 - 99 mg/dL  Brain natriuretic peptide     Status: None   Collection Time: 06/19/17 11:51 PM  Result Value Ref Range   B Natriuretic Peptide 39.0 0.0 - 100.0 pg/mL  MRSA PCR Screening     Status: Abnormal   Collection Time: 06/20/17  1:43 AM  Result Value Ref Range   MRSA by PCR POSITIVE (A) NEGATIVE    Comment:        The GeneXpert MRSA Assay (FDA approved for NASAL specimens only), is one component of a comprehensive MRSA colonization surveillance program. It is not intended to diagnose MRSA infection nor to guide or monitor treatment for MRSA infections. RESULT CALLED TO, READ BACK BY AND VERIFIED WITH: JACKIE PAGE ON 06/20/17 AT 0314 QSD   Basic metabolic panel     Status: Abnormal   Collection Time: 06/20/17  3:26 AM  Result Value Ref Range   Sodium 139 135 - 145 mmol/L   Potassium 3.7 3.5 - 5.1 mmol/L   Chloride 101 101 - 111 mmol/L   CO2 30 22 - 32 mmol/L   Glucose, Bld 162 (H) 65 - 99 mg/dL   BUN 13 6 - 20 mg/dL   Creatinine, Ser 0.58 0.44 - 1.00 mg/dL   Calcium 10.0 8.9 - 10.3 mg/dL   GFR calc non Af Amer >60 >60 mL/min   GFR calc Af  Amer >60 >60 mL/min    Comment: (NOTE) The eGFR has been calculated using the CKD EPI equation. This calculation has not been validated in all clinical situations. eGFR's persistently <60 mL/min signify possible Chronic Kidney Disease.    Anion gap 8 5 - 15  CBC     Status: Abnormal   Collection Time: 06/20/17  3:26 AM  Result Value Ref Range   WBC 9.1 3.6 - 11.0 K/uL   RBC 4.89 3.80 - 5.20 MIL/uL   Hemoglobin 15.1 12.0 - 16.0 g/dL   HCT 45.5 35.0 - 47.0 %   MCV  93.1 80.0 - 100.0 fL   MCH 30.8 26.0 - 34.0 pg   MCHC 33.1 32.0 - 36.0 g/dL   RDW 14.8 (H) 11.5 - 14.5 %   Platelets 296 150 - 440 K/uL  TSH     Status: None   Collection Time: 06/20/17  3:26 AM  Result Value Ref Range   TSH 3.743 0.350 - 4.500 uIU/mL    Comment: Performed by a 3rd Generation assay with a functional sensitivity of <=0.01 uIU/mL.  Glucose, capillary     Status: Abnormal   Collection Time: 06/20/17  7:31 AM  Result Value Ref Range   Glucose-Capillary 172 (H) 65 - 99 mg/dL  Glucose, capillary     Status: Abnormal   Collection Time: 06/20/17 11:41 AM  Result Value Ref Range   Glucose-Capillary 164 (H) 65 - 99 mg/dL  Glucose, capillary     Status: Abnormal   Collection Time: 06/20/17  4:47 PM  Result Value Ref Range   Glucose-Capillary 181 (H) 65 - 99 mg/dL  Glucose, capillary     Status: Abnormal   Collection Time: 06/20/17  8:38 PM  Result Value Ref Range   Glucose-Capillary 183 (H) 65 - 99 mg/dL  Basic metabolic panel     Status: Abnormal   Collection Time: 06/21/17  5:07 AM  Result Value Ref Range   Sodium 136 135 - 145 mmol/L   Potassium 4.5 3.5 - 5.1 mmol/L   Chloride 101 101 - 111 mmol/L   CO2 29 22 - 32 mmol/L   Glucose, Bld 170 (H) 65 - 99 mg/dL   BUN 25 (H) 6 - 20 mg/dL   Creatinine, Ser 1.18 (H) 0.44 - 1.00 mg/dL   Calcium 10.1 8.9 - 10.3 mg/dL   GFR calc non Af Amer 44 (L) >60 mL/min   GFR calc Af Amer 51 (L) >60 mL/min    Comment: (NOTE) The eGFR has been calculated using the CKD  EPI equation. This calculation has not been validated in all clinical situations. eGFR's persistently <60 mL/min signify possible Chronic Kidney Disease.    Anion gap 6 5 - 15  Hemoglobin     Status: None   Collection Time: 06/21/17  5:07 AM  Result Value Ref Range   Hemoglobin 15.4 12.0 - 16.0 g/dL  Glucose, capillary     Status: Abnormal   Collection Time: 06/21/17  7:17 AM  Result Value Ref Range   Glucose-Capillary 181 (H) 65 - 99 mg/dL    Imaging: Ct Angio Chest Pe W And/or Wo Contrast  Result Date: 06/19/2017 CLINICAL DATA:  Oxygen desaturation. History of pulmonary embolism, rib fractures, endometrial cancer. EXAM: CT ANGIOGRAPHY CHEST WITH CONTRAST TECHNIQUE: Multidetector CT imaging of the chest was performed using the standard protocol during bolus administration of intravenous contrast. Multiplanar CT image reconstructions and MIPs were obtained to evaluate the vascular anatomy. CONTRAST:  75 cc Isovue 370 COMPARISON:  Chest radiograph June 19, 2017 and CT chest September 29, 2016 FINDINGS: CARDIOVASCULAR: Adequate contrast opacification of the pulmonary artery's. Main pulmonary artery is not enlarged. No pulmonary arterial filling defects to the level of the subsegmental branches. Heart size is mildly enlarged. No pericardial effusion. Thoracic aorta is normal course and caliber, mild calcific atherosclerosis. MEDIASTINUM/NODES: No lymphadenopathy by CT size criteria. LUNGS/PLEURA: Tracheobronchial tree is patent, no pneumothorax. No pleural effusions, focal consolidations, pulmonary nodules or masses. Mild heterogeneous lung attenuation. UPPER ABDOMEN: Included view of the abdomen is nonsuspicious. Calcified hepatic granulomas. MUSCULOSKELETAL: Visualized soft tissues and included  osseous structures are nonacute. Probable anterior abdominal wall ligamentous laxity, incompletely assessed. Old LEFT anterior rib fractures. Mild degenerative change of thoracic spine. Review of the MIP  images confirms the above findings. IMPRESSION: 1. No acute pulmonary embolism. 2. Heterogeneous lung attenuation seen with pulmonary edema and/or small airway disease/infection. Aortic Atherosclerosis (ICD10-I70.0). Electronically Signed   By: Elon Alas M.D.   On: 06/19/2017 20:40   Dg Chest Portable 1 View  Result Date: 06/19/2017 CLINICAL DATA:  Increased vaginal bleeding EXAM: PORTABLE CHEST 1 VIEW COMPARISON:  07/01/2016 FINDINGS: Mild cardiomegaly. No focal infiltrate or effusion. Stable mediastinal silhouette. No pneumothorax. Nodular opacity projecting over the left lung apex, possibly due to bony summation shadow, similar appearance noted on scout image CT chest September 2017 without parenchymal correlate. IMPRESSION: No active disease.  Cardiomegaly with mild central congestion. Electronically Signed   By: Donavan Foil M.D.   On: 06/19/2017 18:17     Assessment / Plan:   SANDHYA DENHERDER is a 76 y.o.  With multiple medical co-morbidites, but most specifically endometrial cancer, unable to be treated surgically, with progestin IUD in place, s/p EBRT and SBRT in 2016-17 with new onset worsening vaginal bleeding.  1. Vaginal bleeding: concerning for progression of disease.  Exam unable to be performed in the bed she is currently in.  CT of pelvis ordered to assess uterus.  Endometrial biopsy is warranted at time of pelvic exam.  Likely unable to perform pelvic exam this visit without a proper stirrup bed.   She has a progestin source, and likely this exacerbation is either due or compounded by recent elliquis use.    Would prefer a pad count rather than this suction device; it might be better to insert a foley catheter in order to assess the vaginal bleeding, and whether it is at all improving since discontinuing the elliquis.  I could add more progesterone to improve the bleeding, but would need to quantify it for improvement.    Will follow.  Thank you for the opportunity to be  involved with this patient's care.  ----- Larey Days, MD Attending Obstetrician and Gynecologist [facility], Department of OB/GYN [facility]   >100 minutes spent attending to this patient; with review of records and current hospitalization.   >60 minutes of that time spent face-to-face

## 2017-06-21 NOTE — Progress Notes (Signed)
Patient has diagnosis of COPD. Also obesity hypoventilation. Nebulizers and inhalers were tried. Continues to need oxygen for acute hypoxic respiratory failure. Will need to be discharged home with oxygen to liters per minute.

## 2017-06-21 NOTE — Evaluation (Signed)
Physical Therapy Evaluation Patient Details Name: Theresa Blake MRN: [id number] DOB: [birth year] Today's Date: 06/21/2017   History of Present Illness  Pt admitted for heavy vaginal bleeding with hypoxia. Pt with negative PE and is currently on 3L of O2 at this time. Pt with history of HTN.   Clinical Impression  Pt is a pleasant 76 year old female who was admitted for heavy vaginal bleeding with hypoxia. All mobility performed on RA with sats decreasing to 82% with exertion, reapplied 3L of O2 with improvement in O2 sats. RN aware. Pt performs bed mobility with max assist +2 to sit at EOB. Able to tolerate sitting at EOB, however pt with decreased balance, unable to maintain neutral positioning. Unsafe to attempt further mobility at this time. Pt demonstrates deficits with L side strength deficits, safety awareness, endurance, and mobility restrictions. Per patient report, pt with multiple falls trying to get OOB at home (no hospital bed) thus has been non ambulatory for past 2 weeks. PLOF includes transfers using RW to recliner/WC/BSC. Pt is not at baseline level at this time. Would benefit from skilled PT to address above deficits and promote optimal return to PLOF; recommend transition to STR upon discharge from acute hospitalization.       Follow Up Recommendations SNF    Equipment Recommendations  Hospital bed    Recommendations for Other Services OT consult     Precautions / Restrictions Precautions Precautions: Fall Restrictions Weight Bearing Restrictions: No      Mobility  Bed Mobility Overal bed mobility: Needs Assistance Bed Mobility: Supine to Sit;Rolling Rolling: +2 for physical assistance;Min assist;Mod assist   Supine to sit: Max assist;+2 for safety/equipment;+2 for physical assistance;HOB elevated     General bed mobility comments: assist for rolling in bed for positioning of linen. Pt able to follow commands to using arms and legs for assistance. Mod  initially, however able to progress to min assist once on side. Supine->sit performed with max assist +2. Once seated at EOB, needs help to scoot out towards EOB. Pt fatigues quickly, loses balance towards L side, unable to self correct without assistance, propped up with pillows.   Transfers                 General transfer comment: unsafe for attempt as pt with fair sitting balance unable to maintain neutral without assist.  Ambulation/Gait             General Gait Details: unsafe  Stairs            Wheelchair Mobility    Modified Rankin (Stroke Patients Only)       Balance Overall balance assessment: Needs assistance Sitting-balance support: Feet supported Sitting balance-Leahy Scale: Fair Sitting balance - Comments: Pt slighly pushing towards L side with decreased awareness of upright posture. Pt also with posterior leaning, able to be corrected with foward reaching Postural control: Left lateral lean                                   Pertinent Vitals/Pain Pain Assessment: No/denies pain    Home Living Family/patient expects to be discharged to:: Private residence Living Arrangements: Children (son) Available Help at Discharge: Family;Available 24 hours/day Type of Home: House Home Access: Ramped entrance     Home Layout: One level Home Equipment: Walker - 2 wheels;Bedside commode;Wheelchair - manual      Prior Function Level of Independence: Needs assistance  Gait / Transfers Assistance Needed: per patient report, pt was able to perform transfers at baseline to varying surfaces including WC and BSC. 2 weeks ago sufferred fall onto knees and has been bedbound since.           Hand Dominance        Extremity/Trunk Assessment   Upper Extremity Assessment Upper Extremity Assessment: Generalized weakness (L grip 3+/5; elbow 3+/5; R UE grossly 4/5)    Lower Extremity Assessment Lower Extremity Assessment: Generalized  weakness (L knee 3+/5; hip 4/5; R LE grossly 4/5)       Communication   Communication: No difficulties  Cognition Arousal/Alertness: Awake/alert Behavior During Therapy: WFL for tasks assessed/performed Overall Cognitive Status: Within Functional Limits for tasks assessed (very talkative, hard to obtain history)                                        General Comments      Exercises Other Exercises Other Exercises: supine ther-ex performed including B LE hip abd/add, SAQ, SLRs, and quad sets. All ther-ex performed x 10 reps with min assist for L LE and education given for correct technique. All mobility performed on RA with sats decreasing to 82%, 3L of O2 reapplied with sats improving to 90% within a few minutes. Other Exercises: Sat EOB and performed dynamic balance activities, pt able to maintain sitting approx 8 minutes   Assessment/Plan    PT Assessment Patient needs continued PT services  PT Problem List Decreased strength;Decreased activity tolerance;Decreased balance;Decreased mobility;Decreased safety awareness       PT Treatment Interventions Gait training;DME instruction;Therapeutic activities;Therapeutic exercise;Balance training    PT Goals (Current goals can be found in the Care Plan section)  Acute Rehab PT Goals Patient Stated Goal: to get stronger PT Goal Formulation: With patient Time For Goal Achievement: 07/05/17 Potential to Achieve Goals: Fair    Frequency Min 2X/week   Barriers to discharge        Co-evaluation               AM-PAC PT "6 Clicks" Daily Activity  Outcome Measure Difficulty turning over in bed (including adjusting bedclothes, sheets and blankets)?: Unable Difficulty moving from lying on back to sitting on the side of the bed? : Unable Difficulty sitting down on and standing up from a chair with arms (e.g., wheelchair, bedside commode, etc,.)?: Unable Help needed moving to and from a bed to chair (including a  wheelchair)?: Total Help needed walking in hospital room?: Total Help needed climbing 3-5 steps with a railing? : Total 6 Click Score: 6    End of Session Equipment Utilized During Treatment: Oxygen Activity Tolerance: Patient limited by fatigue Patient left: in bed;with bed alarm set Nurse Communication: Mobility status PT Visit Diagnosis: Repeated falls (R29.6);Muscle weakness (generalized) (M62.81);Difficulty in walking, not elsewhere classified (R26.2)    Time: 7026-3785 PT Time Calculation (min) (ACUTE ONLY): 42 min   Charges:   PT Evaluation $PT Eval Moderate Complexity: 1 Mod PT Treatments $Therapeutic Exercise: 8-22 mins $Therapeutic Activity: 8-22 mins   PT G Codes:   PT G-Codes **NOT FOR INPATIENT CLASS** Functional Assessment Tool Used: AM-PAC 6 Clicks Basic Mobility Functional Limitation: Mobility: Walking and moving around Mobility: Walking and Moving Around Current Status (Y8502): 100 percent impaired, limited or restricted Mobility: Walking and Moving Around Goal Status (D7412): At least 80 percent but less than 100 percent  impaired, limited or restricted    Greggory Stallion, PT, DPT [phone]   Roopa Graver 06/21/2017, 4:28 PM

## 2017-06-22 DIAGNOSIS — Z7189 Other specified counseling: Secondary | ICD-10-CM

## 2017-06-22 DIAGNOSIS — R0902 Hypoxemia: Secondary | ICD-10-CM | POA: Diagnosis not present

## 2017-06-22 DIAGNOSIS — R531 Weakness: Secondary | ICD-10-CM | POA: Diagnosis not present

## 2017-06-22 DIAGNOSIS — Z515 Encounter for palliative care: Secondary | ICD-10-CM

## 2017-06-22 DIAGNOSIS — J9601 Acute respiratory failure with hypoxia: Secondary | ICD-10-CM | POA: Diagnosis not present

## 2017-06-22 DIAGNOSIS — N939 Abnormal uterine and vaginal bleeding, unspecified: Secondary | ICD-10-CM | POA: Diagnosis not present

## 2017-06-22 LAB — GLUCOSE, CAPILLARY
Glucose-Capillary: 153 mg/dL — ABNORMAL HIGH (ref 65–99)
Glucose-Capillary: 216 mg/dL — ABNORMAL HIGH (ref 65–99)

## 2017-06-22 MED ORDER — MEGESTROL ACETATE 20 MG PO TABS
40.0000 mg | ORAL_TABLET | Freq: Two times a day (BID) | ORAL | Status: DC
  Administered 2017-06-22: 40 mg via ORAL
  Filled 2017-06-22 (×2): qty 2

## 2017-06-22 NOTE — Progress Notes (Signed)
[facility] [place] NAME: Theresa Blake    MR#:  [id number]  DATE OF BIRTH:  [birth year]  SUBJECTIVE:  CHIEF COMPLAINT:   Chief Complaint  Patient presents with  . Vaginal Bleeding   More awake. used CPAP  overnight   REVIEW OF SYSTEMS:    Review of Systems  Constitutional: Positive for malaise/fatigue. Negative for chills and fever.  HENT: Negative for sore throat.   Eyes: Negative for blurred vision, double vision and pain.  Respiratory: Negative for cough, hemoptysis, shortness of breath and wheezing.   Cardiovascular: Negative for chest pain, palpitations, orthopnea and leg swelling.  Gastrointestinal: Negative for abdominal pain, constipation, diarrhea, heartburn, nausea and vomiting.  Genitourinary: Negative for dysuria and hematuria.  Musculoskeletal: Negative for back pain and joint pain.  Skin: Negative for rash.  Neurological: Positive for weakness. Negative for sensory change, speech change, focal weakness and headaches.  Endo/Heme/Allergies: Does not bruise/bleed easily.  Psychiatric/Behavioral: Negative for depression. The patient is not nervous/anxious.     DRUG ALLERGIES:   Allergies  Allergen Reactions  . Anesthetics, Halogenated Shortness Of Breath  . Levaquin [Levofloxacin In D5w] Itching  . Tramadol Other (See Comments)    Reaction: unknown  . Lamotrigine Itching    VITALS:  Blood pressure 131/71, pulse 96, temperature 97.8 F (36.6 C), temperature source Oral, resp. rate 20, height 5\' 5"  (1.651 m), weight (!) 150.2 kg (331 lb 3.2 oz), SpO2 95 %.  PHYSICAL EXAMINATION:   Physical Exam  GENERAL:  76 y.o.-year-old patient lying in the bed with no acute distress. Morbidly obese EYES: Pupils equal, round, reactive to light and accommodation. No scleral icterus. Extraocular muscles intact.  HEENT: Head atraumatic, normocephalic. Oropharynx and nasopharynx clear.  NECK:  Supple, no jugular venous distention. No  thyroid enlargement, no tenderness.  LUNGS: Normal breath sounds bilaterally, no wheezing, rales, rhonchi. No use of accessory muscles of respiration.  CARDIOVASCULAR: S1, S2 normal. No murmurs, rubs, or gallops.  ABDOMEN: Soft, nontender, nondistended. Bowel sounds present. No organomegaly or mass.  EXTREMITIES: No cyanosis, clubbing or edema b/l. NEUROLOGIC: Cranial nerves II through XII are intact. No focal Motor or sensory deficits b/l.   PSYCHIATRIC: The patient is drowzy. But oriented x 3  SKIN: No obvious rash, lesion, or ulcer.   LABORATORY PANEL:   CBC  Recent Labs Lab 06/20/17 0326 06/21/17 0507  WBC 9.1  --   HGB 15.1 15.4  HCT 45.5  --   PLT 296  --    ------------------------------------------------------------------------------------------------------------------ Chemistries   Recent Labs Lab 06/21/17 0507  NA 136  K 4.5  CL 101  CO2 29  GLUCOSE 170*  BUN 25*  CREATININE 1.18*  CALCIUM 10.1   ------------------------------------------------------------------------------------------------------------------  Cardiac Enzymes  Recent Labs Lab 06/19/17 1720  TROPONINI <0.03   ------------------------------------------------------------------------------------------------------------------  RADIOLOGY:  Ct Pelvis W Contrast  Result Date: 06/21/2017 CLINICAL DATA:  76 year old female with history of endometrial carcinoma. Vaginal bleeding. EXAM: CT PELVIS WITH CONTRAST TECHNIQUE: Multidetector CT imaging of the pelvis was performed using the standard protocol following the bolus administration of intravenous contrast. CONTRAST:  140mL ISOVUE-300 IOPAMIDOL (ISOVUE-300) INJECTION 61% COMPARISON:  MRI of the pelvis 03/26/2014. FINDINGS: Urinary Tract: Contrast opacification of urine in the urinary bladder. Distal ureters and urinary bladder are otherwise unremarkable in appearance. Bowel: Visualized portions of small bowel, colon and rectum are unremarkable in  appearance. 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 No lymphadenopathy noted in the visualized pelvis. Reproductive: IUD present in the endometrial canal. Endometrium measures up to 16 mm in thickness in the fundal region. Heterogeneous enhancement of the myometrium, particularly anterior aspect of the uterine fundus where there are few punctate calcifications. This entire area measures approximately 6.5 x 2.8 x 4.8 cm (axial image 53 of series 3 and sagittal image 92 of series 9). 16 mm calcified lesion in the anterior aspect of the lower uterine segment adjacent to the cervix, presumably a small fibroid. Left ovary is atrophic. 2.8 x 2.5 x 2.8 cm low-attenuation lesion in the right ovary. Other: No significant volume of ascites and no pneumoperitoneum noted in the visualized portions of the peritoneal cavity. Musculoskeletal: There are no aggressive appearing lytic or blastic lesions noted in the visualized portions of the skeleton. IMPRESSION: 1. Mass-like area of heterogeneous enhancement and punctate calcifications in the anterior aspect of the uterine fundus appears to be new compared to prior MRI of the pelvis 03/26/2014, presumably related to progression of disease with myometrial invasion. Endometrium in the fundal region is also thicker than previously noted, currently measuring up to 16 mm, now with an IUD in place. 2. Previously noted cystic right ovarian lesion is smaller than previously new seen, currently measuring 2.8 x 2.5 x 2.8 cm, presumably benign. 3. Aortic atherosclerosis. 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 Electronically Signed   By: Vinnie Langton M.D.   On: 06/21/2017 10:37     ASSESSMENT AND PLAN:   * Acute hypoxic respiratory failure Likely obesity hypoventilation syndrome And long-standing COPD    no exacerbation at this time. We will set up oxygen at home. Discussed with case Freight forwarder.  * Vaginal bleeding Stable HB Check labs again Discussed with Dr. Leonides Schanz with gynecology. CT scan of the pelvis shows slightly reoccurrence of uterine cancer. Will need follow-up with [facility] gynecology oncology.   * HTN Continue home meds  * DM SSI  * Hypothyroidism TSH normal  Continue Levothyroxine  Discharge tomorrow  All the records are reviewed and case discussed with Care Management/Social Worker Management plans discussed with the patient, family and they are in agreement.  CODE STATUS: FULL CODE  DVT Prophylaxis: SCDs  TOTAL TIME TAKING CARE OF THIS PATIENT: 35 minutes.   Hillary Bow R M.D on 06/22/2017 at 2:04 PM  Between 7am to 6pm - Pager - [phone]  After 6pm go to [email or web address] - password [email or web address] [facility]  Office  [phone]  CC: Primary care physician; Katheren Shams  Note: This dictation was prepared with Dragon dictation along with smaller phrase technology. Any transcriptional errors that result from this process are unintentional.

## 2017-06-22 NOTE — Consult Note (Signed)
Consultation Note Date: 06/22/2017   Patient Name: Theresa Blake  DOB: August 09, 1941  MRN: [id number]  Age / Sex: 76 y.o., female  PCP: Katheren Shams Referring Physician: Hillary Bow, MD  Reason for Consultation: Establishing goals of care  HPI/Patient Profile: 76 y.o. female  with past medical history of morbid obesity, hypertension, hyperlipidemia, panic disorder, depression, foot fracture, diabetes mellitus type II, endometrial cancer, and falls admitted on 06/19/2017 with vaginal bleeding. In ED, patient was found to be hypoxic. Hx of vaginal bleeding and followed by [facility] gynecology/oncology. Received radiation in the past but was not a surgical candidate due to co-morbidities. Home oxygen recently initiated due to hypoxia likely secondary to obesity hypoventilation syndrome. Patient did not complete a sleep study. GYN consulted for vaginal bleeding. CT revealed mass-like area suspicious of progression of disease. Plan for f/u outpatient. Started on oral progesterone. FULL code. Palliative medicine consultation for goals of care.   Clinical Assessment and Goals of Care: I have reviewed medical records, discussed with care team, and met with patient at bedside to discuss diagnosis, GOC, EOL wishes, disposition and options. Conversation very scattered. Patient is awake and alert but easily gets off topic. Eldest son, [name], initially at bedside. When I followed-up, son/caregiver [name] was at bedside.   Introduced Palliative Medicine as specialized medical care for people living with serious illness. It focuses on providing relief from the symptoms and stress of a serious illness. The goal is to improve quality of life for both the patient and the family.  We discussed a brief life review of the patient. Widowed. Four adult sons, two in [place] and twin sons live in [place]. 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  Patient lives with son [name]). Multiple falls. For the past 10 days, she has been very weak and unable to ambulate--only can pivot from bed to chair. She has had physical therapy in the home. Son assists with all ADL's. Good appetite. Has not completed sleep study. Spiritual individual.   Discussed hospital diagnoses and interventions. Patient and sons understand CT results and possible progression of disease. Patient becomes very irritable during the conversation. "I do not want to keep talking about it." Plans to follow up with outpatient oncology for next steps. Also planning for sleep study. [name] speaks of his concern with not being able to get her to appointments due to decreased mobility. PT will f/u in the home along with an aid.   Advanced directives and concepts specific to code status were discussed. Patient does not have a documented living will or HCPOA in epic but tells me she completed living will "many years ago" with husband. [name] does not know where this paperwork is at). Encouraged [name] to consider who she would designate as a Marine scientist if she was unable to make decisions for herself. Again patient becomes irritable. "I have thought about this already." I discussed MOST form and asked her wishes on resuscitation/life support if her heart stopped. All she says is "only if I was brain dead" and changes  the subject.  Questions and concerns were addressed. Therapeutic listening and emotional/spiritual support provided.   SUMMARY OF RECOMMENDATIONS    FULL code/ FULL scope treatment  Patient/family plan to f/u with outpatient GYN/oncology and complete sleep study.   Encouraged she consider completing advanced directives/HCPOA in the future. Also consider thoughts on resuscitation/life support with multiple co-morbidities.   May benefit from outpatient palliative to continue [name] conversations. Discharging today.   Code Status/Advance Care Planning:  Full code  Symptom  Management:   Per attending  Palliative Prophylaxis:   Aspiration, Delirium Protocol, Oral Care and Turn Reposition  Additional Recommendations (Limitations, Scope, Preferences):  Full Scope Treatment  Psycho-social/Spiritual:   Desire for further Chaplaincy support: yes  Additional Recommendations: Caregiving  Support/Resources and Education on Hospice  Prognosis:   Unable to determine  Discharge Planning: Home with Home Health      Primary Diagnoses: Present on Admission: . Diabetes mellitus type 2 in obese ([place]) . Essential hypertension . Hypothyroidism . Hyperlipidemia . Vaginal bleeding . Hypoxia   I have reviewed the medical record, interviewed the patient and family, and examined the patient. The following aspects are pertinent.  Past Medical History:  Diagnosis Date  . Abnormal Pap smear of cervix   . Allergic rhinitis, cause unspecified   . Backache, unspecified   . Depression    mood disorder; ? bipolar  . Dermatomycosis, unspecified   . Diabetes mellitus type II   . Edema   . Foot fracture, right   . HLD (hyperlipidemia)   . HTN (hypertension)   . Hx pulmonary embolism    multiple  . Lumbar spinal stenosis   . Malignant neoplasm of corpus uteri, except isthmus ([place])   . Morbid obesity ([place])   . Other specified disease of hair and hair follicles   . Pain in joint, pelvic region and thigh   . Panic disorder without agoraphobia    Social History   Social History  . Marital status: Widowed    Spouse name: N/A  . Number of children: 4  . Years of education: N/A   Occupational History  . Disabled    Social History Main Topics  . Smoking status: Never Smoker  . Smokeless tobacco: Never Used  . Alcohol use No  . Drug use: No  . Sexual activity: Not Asked   Other Topics Concern  . None   Social History Narrative   Widow-husband died with MI, DM      4 sons      Disability      Compulsive overeater            Family History   Problem Relation Age of Onset  . Heart attack Father 26  . Pancreatic cancer Mother        with mets  . Hyperlipidemia Son   . Hyperlipidemia Son   . Gout Son    Scheduled Meds: . azithromycin  250 mg Oral Daily  . buPROPion  450 mg Oral Daily  . Chlorhexidine Gluconate Cloth  6 each Topical Q0600  . insulin aspart  0-5 Units Subcutaneous QHS  . insulin aspart  0-9 Units Subcutaneous TID WC  . levothyroxine  50 mcg Oral QAC breakfast  . lisinopril  20 mg Oral Daily  . mouth rinse  15 mL Mouth Rinse BID  . megestrol  40 mg Oral BID  . mupirocin ointment  1 application Nasal BID  . pantoprazole  40 mg Oral Daily  . venlafaxine XR  300 mg Oral Daily   Continuous Infusions: PRN Meds:.acetaminophen **OR** acetaminophen, diphenhydrAMINE, methocarbamol, ondansetron **OR** ondansetron (ZOFRAN) IV, senna Medications Prior to Admission:  Prior to Admission medications   Medication Sig Start Date End Date Taking? Authorizing Provider  acetaminophen (TYLENOL) 500 MG tablet Take 1,000 mg by mouth every 6 (six) hours as needed for mild pain, moderate pain, fever or headache.   Yes [provider]  apixaban (ELIQUIS) 2.5 MG TABS tablet Take 2.5 mg by mouth 2 (two) times daily.   Yes [provider]  b complex vitamins tablet Take 1 tablet by mouth daily.   Yes [provider]  Blood Glucose Monitoring Suppl (ACCU-CHEK AVIVA PLUS) W/DEVICE KIT by Does not apply route.   Yes [provider]  buPROPion (WELLBUTRIN XL) 150 MG 24 hr tablet Take 450 mg by mouth daily.    Yes [provider]  clonazePAM (KLONOPIN) 1 MG tablet Take 1 mg by mouth 3 (three) times daily as needed for anxiety. For anxiety   Yes [provider]  diphenhydrAMINE (BENADRYL) 25 MG tablet Take 25 mg by mouth every 6 (six) hours as needed for allergies.   Yes [provider]  ezetimibe-simvastatin (VYTORIN) 10-20 MG tablet Take 1 tablet by mouth daily.   Yes [provider]  fluticasone (FLONASE) 50 MCG/ACT nasal spray Place 2 sprays into both nostrils daily.   Yes [provider]  furosemide (LASIX) 20 MG tablet Take 20 mg by mouth daily. 06/17/16  Yes [provider]  glucose blood (ACCU-CHEK AVIVA PLUS) test strip 1 each by Other route as needed for other. Use as instructed   Yes [provider]  levothyroxine (SYNTHROID, LEVOTHROID) 50 MCG tablet Take 1 tablet (50 mcg total) by mouth daily before breakfast. 02/28/14  Yes Einar Pheasant, MD  lisinopril (PRINIVIL,ZESTRIL) 20 MG tablet TAKE 1 TABLET BY MOUTH EVERY DAY 04/12/14  Yes Einar Pheasant, MD  Multiple Vitamin (MULTIVITAMIN) tablet Take 1 tablet by mouth daily.     Yes [provider]  omeprazole (PRILOSEC) 20 MG capsule TAKE ONE CAPSULE BY MOUTH EVERY DAY *NEEDS OFFICE VISIT*   Yes Einar Pheasant, MD  venlafaxine (EFFEXOR-XR) 150 MG 24 hr capsule Take 300 mg by mouth daily.     Yes [provider]   Allergies  Allergen Reactions  . Anesthetics, Halogenated Shortness Of Breath  . Levaquin [Levofloxacin In D5w] Itching  . Tramadol Other (See Comments)    Reaction: unknown  . Lamotrigine Itching   Review of Systems  Constitutional: Positive for activity change and fatigue.       Falls  Genitourinary:       Vaginal bleeding   Physical Exam  Constitutional: She is oriented to person, place, and time. She appears well-developed and well-nourished. She is cooperative.  HENT:  Head: Normocephalic and atraumatic.  Cardiovascular: Regular rhythm.   Pulmonary/Chest: Effort normal.  Neurological: She is alert and oriented to person, place, and time.  Skin: Skin is warm and dry. There is pallor.  Psychiatric: She has a normal mood and affect. Her speech is normal and behavior is normal. Cognition and memory are normal.  Nursing note and vitals reviewed.  Vital Signs: BP 131/71   Pulse 96   Temp 97.8 F (36.6 C) (Oral)   Resp 20   Ht '5\' 5"'   (1.651 m)   Wt (!) 150.2 kg (331 lb 3.2 oz)   SpO2 95%   BMI 55.11 kg/m  Pain Assessment: No/denies pain  Pain Score: 1   SpO2: SpO2: 95 % O2 Device:SpO2: 95 % O2 Flow Rate: .O2 Flow Rate (L/min): 3 L/min  IO: Intake/output summary:   Intake/Output Summary (Last 24 hours) at 06/22/17 1420 Last data filed at 06/22/17 0500  Gross per 24 hour  Intake                0 ml  Output              600 ml  Net             -600 ml    LBM: Last BM Date: 06/22/17 Baseline Weight: Weight: 136.1 kg (300 lb) Most recent weight: Weight: (!) 150.2 kg (331 lb 3.2 oz)     Palliative Assessment/Data: PPS 40%   Flowsheet Rows     Most Recent Value  Intake Tab  Referral Department  Hospitalist  Unit at Time of Referral  Oncology Unit  Palliative Care Primary Diagnosis  Cancer  Palliative Care Type  New Palliative care  Reason for referral  Clarify Goals of Care  Date first seen by Palliative Care  06/22/17  Clinical Assessment  Palliative Performance Scale Score  40%  Psychosocial & Spiritual Assessment  Palliative Care Outcomes  Patient/Family meeting held?  Yes  Who was at the meeting?  patient, two sons  Palliative Care Outcomes  Clarified goals of care, Provided psychosocial or spiritual support, ACP counseling assistance      Time In: 1300 Time Out: 1410 Time Total: 14mn Greater than 50%  of this time was spent counseling and coordinating care related to the above assessment and plan.  Signed by:  MIhor Dow FNP-C Palliative Medicine Team  Phone: [phone]: [phone]  Please contact Palliative Medicine Team phone at [phone] questions and concerns.  For individual provider: See AShea Evans

## 2017-06-22 NOTE — Plan of Care (Signed)
Pt admitted for hypoxia is d/ced home.  Discharge was delayed so Palliative Medicine could discuss Goals of Care and Advance Directives.  It is difficult for patient to stay focused.  Palliative will f/u at home.  She will have Home Health at home.  Pt had no c/o pain today.  She qualified for and will have O2 provided at home.  Eager to get home.  Nurse Tech removed IV.  She is being transported via EMS.

## 2017-06-22 NOTE — Progress Notes (Signed)
Obstetric and Gynecology  HD 4  Subjective   No changes, still bleeding but does not know how much.  No pain.   Objective  Objective:   Vitals:   06/21/17 0925 06/21/17 1311 06/21/17 1930 06/22/17 0657  BP: (!) 148/74 99/81 (!) 108/51 116/74  Pulse: 92 81 (!) 103 (!) 101  Resp:  18 20 20   Temp:  (!) 97.5 F (36.4 C) 98 F (36.7 C) 97.8 F (36.6 C)  TempSrc: Oral Oral Oral Oral  SpO2: 96% 93% 93% 95%  Weight:    (!) 150.2 kg (331 lb 3.2 oz)  Height:    5\' 5"  (1.651 m)    General: NAD Cardiovascular: RRR, Pulmonary: CTAB, on 3L  Abdomen: Benign. Non-tender, +BS, no guarding.   Labs: Results for orders placed or performed during the hospital encounter of 06/19/17 (from the past 24 hour(s))  Glucose, capillary     Status: Abnormal   Collection Time: 06/21/17 11:26 AM  Result Value Ref Range   Glucose-Capillary 157 (H) 65 - 99 mg/dL  Glucose, capillary     Status: Abnormal   Collection Time: 06/21/17  4:39 PM  Result Value Ref Range   Glucose-Capillary 175 (H) 65 - 99 mg/dL  Glucose, capillary     Status: Abnormal   Collection Time: 06/21/17  7:34 PM  Result Value Ref Range   Glucose-Capillary 224 (H) 65 - 99 mg/dL  Glucose, capillary     Status: Abnormal   Collection Time: 06/21/17  9:55 PM  Result Value Ref Range   Glucose-Capillary 146 (H) 65 - 99 mg/dL    Cultures: Results for orders placed or performed during the hospital encounter of 06/19/17  Blood culture (routine x 2)     Status: None (Preliminary result)   Collection Time: 06/19/17  9:15 PM  Result Value Ref Range Status   Specimen Description BLOOD LEFT HAND  Final   Special Requests   Final    BOTTLES DRAWN AEROBIC AND ANAEROBIC Blood Culture adequate volume   Culture NO GROWTH 3 DAYS  Final   Report Status PENDING  Incomplete  Blood culture (routine x 2)     Status: None (Preliminary result)   Collection Time: 06/19/17  9:40 PM  Result Value Ref Range Status   Specimen Description BLOOD LEFT  ANTECUBITAL  Final   Special Requests   Final    BOTTLES DRAWN AEROBIC AND ANAEROBIC Blood Culture adequate volume   Culture NO GROWTH 3 DAYS  Final   Report Status PENDING  Incomplete  MRSA PCR Screening     Status: Abnormal   Collection Time: 06/20/17  1:43 AM  Result Value Ref Range Status   MRSA by PCR POSITIVE (A) NEGATIVE Final    Comment:        The GeneXpert MRSA Assay (FDA approved for NASAL specimens only), is one component of a comprehensive MRSA colonization surveillance program. It is not intended to diagnose MRSA infection nor to guide or monitor treatment for MRSA infections. RESULT CALLED TO, READ BACK BY AND VERIFIED WITH: JACKIE PAGE ON 06/20/17 AT 3267 QSD     Imaging: Ct Angio Chest Pe W And/or Wo Contrast  Result Date: 06/19/2017 CLINICAL DATA:  Oxygen desaturation. History of pulmonary embolism, rib fractures, endometrial cancer. EXAM: CT ANGIOGRAPHY CHEST WITH CONTRAST TECHNIQUE: Multidetector CT imaging of the chest was performed using the standard protocol during bolus administration of intravenous contrast. Multiplanar CT image reconstructions and MIPs were obtained to evaluate the vascular anatomy. CONTRAST:  75 cc Isovue 370 COMPARISON:  Chest radiograph June 19, 2017 and CT chest September 29, 2016 FINDINGS: CARDIOVASCULAR: Adequate contrast opacification of the pulmonary artery's. Main pulmonary artery is not enlarged. No pulmonary arterial filling defects to the level of the subsegmental branches. Heart size is mildly enlarged. No pericardial effusion. Thoracic aorta is normal course and caliber, mild calcific atherosclerosis. MEDIASTINUM/NODES: No lymphadenopathy by CT size criteria. LUNGS/PLEURA: Tracheobronchial tree is patent, no pneumothorax. No pleural effusions, focal consolidations, pulmonary nodules or masses. Mild heterogeneous lung attenuation. UPPER ABDOMEN: Included view of the abdomen is nonsuspicious. Calcified hepatic granulomas.  MUSCULOSKELETAL: Visualized soft tissues and included osseous structures are nonacute. Probable anterior abdominal wall ligamentous laxity, incompletely assessed. Old LEFT anterior rib fractures. Mild degenerative change of thoracic spine. Review of the MIP images confirms the above findings. IMPRESSION: 1. No acute pulmonary embolism. 2. Heterogeneous lung attenuation seen with pulmonary edema and/or small airway disease/infection. Aortic Atherosclerosis (ICD10-I70.0). Electronically Signed   By: Elon Alas M.D.   On: 06/19/2017 20:40   Ct Pelvis W Contrast  Result Date: 06/21/2017 CLINICAL DATA:  76 year old female with history of endometrial carcinoma. Vaginal bleeding. EXAM: CT PELVIS WITH CONTRAST TECHNIQUE: Multidetector CT imaging of the pelvis was performed using the standard protocol following the bolus administration of intravenous contrast. CONTRAST:  187mL ISOVUE-300 IOPAMIDOL (ISOVUE-300) INJECTION 61% COMPARISON:  MRI of the pelvis 03/26/2014. FINDINGS: Urinary Tract: Contrast opacification of urine in the urinary bladder. Distal ureters and urinary bladder are otherwise unremarkable in appearance. Bowel: Visualized portions of small bowel, colon and rectum are unremarkable in appearance. 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 No lymphadenopathy noted in the visualized pelvis. Reproductive: IUD present in the endometrial canal. Endometrium measures up to 16 mm in thickness in the fundal region. Heterogeneous enhancement of the myometrium, particularly anterior aspect of the uterine fundus where there are few punctate calcifications. This entire area measures approximately 6.5 x 2.8 x 4.8 cm (axial image 53 of series 3 and sagittal image 92 of series 9). 16 mm calcified lesion in the anterior aspect of the lower uterine segment adjacent to the cervix, presumably a small fibroid.  Left ovary is atrophic. 2.8 x 2.5 x 2.8 cm low-attenuation lesion in the right ovary. Other: No significant volume of ascites and no pneumoperitoneum noted in the visualized portions of the peritoneal cavity. Musculoskeletal: There are no aggressive appearing lytic or blastic lesions noted in the visualized portions of the skeleton. IMPRESSION: 1. Mass-like area of heterogeneous enhancement and punctate calcifications in the anterior aspect of the uterine fundus appears to be new compared to prior MRI of the pelvis 03/26/2014, presumably related to progression of disease with myometrial invasion. Endometrium in the fundal region is also thicker than previously noted, currently measuring up to 16 mm, now with an IUD in place. 2. Previously noted cystic right ovarian lesion is smaller than previously new seen, currently measuring 2.8 x 2.5 x 2.8 cm, presumably benign. 3. Aortic atherosclerosis. 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 Electronically Signed   By: Vinnie Langton M.D.   On: 06/21/2017 10:37   Dg Chest Portable 1 View  Result Date: 06/19/2017 CLINICAL DATA:  Increased vaginal bleeding EXAM: PORTABLE CHEST 1 VIEW COMPARISON:  07/01/2016 FINDINGS: Mild cardiomegaly. No focal infiltrate or effusion. Stable mediastinal silhouette. No pneumothorax. Nodular opacity projecting over the left lung  apex, possibly due to bony summation shadow, similar appearance noted on scout image CT chest September 2017 without parenchymal correlate. IMPRESSION: No active disease.  Cardiomegaly with mild central congestion. Electronically Signed   By: Donavan Foil M.D.   On: 06/19/2017 18:17     Assessment   76 y.o. . Hospital Day: 4 with endometrial cancer and vaginal bleeding  Plan   1. Vaginal bleeding - really not being addressed at this visit, and am unable to quantify this amount due to the apparatus they have set up to collect her urine -  also collects the blood.  On CT of the pelvis there is thickening of endometrium surrounding the progestin IUD and a posterior aspect that is concerning for myometrial invasion or progression - this was, however compared to imaging performed BEFORE she received her EBRT and SBRT.   2. She has stopped the Elliquis and will need to be placed back on another (or this, if we can resolve her bleeding) blood thinner due to her chronic thromboembolic disease. 3. To temper the bleeding, I am going to add an oral progesterone agent to be taken twice daily.  She will follow up with GYN [facility] at [facility] (either here or in [place]) as soon as possible.  She needs to have an endometrial biopsy performed at least.    Will continue to follow while she is inpatient.  It seems as though she is being considered for discharge.  ----- Larey Days, MD Attending Obstetrician and Gynecologist [facility], Department of [facility]

## 2017-06-22 NOTE — Care Management (Signed)
Discharge to home today per Dr. Darvin Neighbours. Transportation will be arranged per [employer]. [facility] will be following in the home [place] [facility] [place] [facility] [phone]

## 2017-06-23 NOTE — Discharge Summary (Signed)
[place] [facility] [place] NAME: Theresa Blake    MR#:  [id number]  DATE OF BIRTH:  November 02, 1940  DATE OF ADMISSION:  06/19/2017 ADMITTING PHYSICIAN: Lance Coon, MD  DATE OF DISCHARGE: 06/22/2017  3:00 PM  PRIMARY CARE PHYSICIAN: [facility]   ADMISSION DIAGNOSIS:  Hypoxia [R09.02]  DISCHARGE DIAGNOSIS:  Principal Problem:   Hypoxia Active Problems:   Hypothyroidism   Diabetes mellitus type 2 in obese [facility])   Hyperlipidemia   Essential hypertension   Vaginal bleeding   Weakness   Palliative care by specialist   SECONDARY DIAGNOSIS:   Past Medical History:  Diagnosis Date  . Abnormal Pap smear of cervix   . Allergic rhinitis, cause unspecified   . Backache, unspecified   . Depression    mood disorder; ? bipolar  . Dermatomycosis, unspecified   . Diabetes mellitus type II   . Edema   . Foot fracture, right   . HLD (hyperlipidemia)   . HTN (hypertension)   . Hx pulmonary embolism    multiple  . Lumbar spinal stenosis   . Malignant neoplasm of corpus uteri, except isthmus ([place])   . Morbid obesity ([place])   . Other specified disease of hair and hair follicles   . Pain in joint, pelvic region and thigh   . Panic disorder without agoraphobia      ADMITTING HISTORY  HISTORY OF PRESENT ILLNESS:  Theresa Blake  is a 76 y.o. female who presents with worsening vaginal bleeding, and was found here to be hypoxic. Patient has a history of her some time now having some vaginal bleeding. She followed with [facility] gynecology or gynecologic oncology per her report.  Initially they told her she needed a hysterectomy, but due to allergies to anesthesia this was unable to be done. She got some local radiation per her report, but the bleeding has returned since that time. She is not clear as to what her initial causative diagnosis is. Here in the ED she was also noted to be hypoxic. She states that a couple months ago she fell and fractured some  ribs and was found to be hypoxic in the hospital at that time. She was told that there was suspicion that she had sleep apnea, and likely has obesity hypoventilation syndrome as well. She was sent home with some supplemental oxygen at that time, and recommended to get a sleep study. She has not done so. Her hypoxia corrected with supplemental oxygen here in the ED. Imaging did indicate possible low-grade pulmonary infection, though the rest of her workup was largely within normal limits including white blood cell count etc. hospitalists were called for admission   HOSPITAL COURSE:   * Acute hypoxic respiratory failure Likely obesity hypoventilation syndrome And long-standing COPD   no exacerbation at this time.  Home oxygen has been set up and she will continue 2 L/m.  * Vaginal bleeding Stable HB Discussed with Dr. Leonides Schanz with gynecology. CT scan of the pelvis shows slightly reoccurrence of uterine cancer. Will need follow-up with [facility] gynecology oncology.  Eliquis stopped till patient follows with her physician. Advised to restart when bleeding stops.   * HTN Continue home meds  * DM SSI  * Hypothyroidism TSH normal  Continue Levothyroxine  Stable for discharge home with home oxygen.  CONSULTS OBTAINED:  Treatment Team:  Ward, Honor Loh, MD  DRUG ALLERGIES:   Allergies  Allergen Reactions  . Anesthetics, Halogenated Shortness Of Breath  . Levaquin [  Levofloxacin In D5w] Itching  . Tramadol Other (See Comments)    Reaction: unknown  . Lamotrigine Itching    DISCHARGE MEDICATIONS:   Discharge Medication List as of 06/21/2017  7:48 PM    CONTINUE these medications which have NOT CHANGED   Details  acetaminophen (TYLENOL) 500 MG tablet Take 1,000 mg by mouth every 6 (six) hours as needed for mild pain, moderate pain, fever or headache., Historical Med    apixaban (ELIQUIS) 2.5 MG TABS tablet Take 2.5 mg by mouth 2 (two) times daily., Historical Med    b complex  vitamins tablet Take 1 tablet by mouth daily., Historical Med    Blood Glucose Monitoring Suppl (ACCU-CHEK AVIVA PLUS) W/DEVICE KIT by Does not apply route., Historical Med    buPROPion (WELLBUTRIN XL) 150 MG 24 hr tablet Take 450 mg by mouth daily. , Historical Med    clonazePAM (KLONOPIN) 1 MG tablet Take 1 mg by mouth 3 (three) times daily as needed for anxiety. For anxiety, Historical Med    diphenhydrAMINE (BENADRYL) 25 MG tablet Take 25 mg by mouth every 6 (six) hours as needed for allergies., Historical Med    ezetimibe-simvastatin (VYTORIN) 10-20 MG tablet Take 1 tablet by mouth daily., Historical Med    fluticasone (FLONASE) 50 MCG/ACT nasal spray Place 2 sprays into both nostrils daily., Historical Med    furosemide (LASIX) 20 MG tablet Take 20 mg by mouth daily., Starting Thu 06/17/2016, Historical Med    glucose blood (ACCU-CHEK AVIVA PLUS) test strip 1 each by Other route as needed for other. Use as instructed, Historical Med    levothyroxine (SYNTHROID, LEVOTHROID) 50 MCG tablet Take 1 tablet (50 mcg total) by mouth daily before breakfast., Starting Thu 02/28/2014, Normal    lisinopril (PRINIVIL,ZESTRIL) 20 MG tablet TAKE 1 TABLET BY MOUTH EVERY DAY, Normal    Multiple Vitamin (MULTIVITAMIN) tablet Take 1 tablet by mouth daily.  , Until Discontinued, Historical Med    omeprazole (PRILOSEC) 20 MG capsule TAKE ONE CAPSULE BY MOUTH EVERY DAY *NEEDS OFFICE VISIT*, Normal    venlafaxine (EFFEXOR-XR) 150 MG 24 hr capsule Take 300 mg by mouth daily.  , Until Discontinued, Historical Med      STOP taking these medications     methocarbamol (ROBAXIN) 500 MG tablet      senna (SENOKOT) 8.6 MG TABS tablet         Today   VITAL SIGNS:  Blood pressure (!) 141/56, pulse (!) 103, temperature 98.4 F (36.9 C), resp. rate 20, height '5\' 5"'  (1.651 m), weight (!) 150.2 kg (331 lb 3.2 oz), SpO2 95 %.  I/O:  No intake or output data in the 24 hours ending 06/23/17 1338  PHYSICAL  EXAMINATION:  Physical Exam  GENERAL:  76 y.o.-year-old patient lying in the bed. Morbidly obese LUNGS: Normal breath sounds bilaterally, no wheezing, rales,rhonchi or crepitation. No use of accessory muscles of respiration.  CARDIOVASCULAR: S1, S2 normal. No murmurs, rubs, or gallops.  ABDOMEN: Soft, non-tender, non-distended. Bowel sounds present. No organomegaly or mass.  NEUROLOGIC: Moves all 4 extremities. Symmetrical strength bilaterally. Lower extremity weakness on both sides PSYCHIATRIC: The patient is alert and oriented x 3.  SKIN: No obvious rash, lesion, or ulcer.   DATA REVIEW:   CBC  Recent Labs Lab 06/20/17 0326 06/21/17 0507  WBC 9.1  --   HGB 15.1 15.4  HCT 45.5  --   PLT 296  --     Chemistries   Recent Labs Lab  06/21/17 0507  NA 136  K 4.5  CL 101  CO2 29  GLUCOSE 170*  BUN 25*  CREATININE 1.18*  CALCIUM 10.1    Cardiac Enzymes  Recent Labs Lab 06/19/17 1720  TROPONINI <0.03    Microbiology Results  Results for orders placed or performed during the hospital encounter of 06/19/17  Blood culture (routine x 2)     Status: None (Preliminary result)   Collection Time: 06/19/17  9:15 PM  Result Value Ref Range Status   Specimen Description BLOOD LEFT HAND  Final   Special Requests   Final    BOTTLES DRAWN AEROBIC AND ANAEROBIC Blood Culture adequate volume   Culture NO GROWTH 4 DAYS  Final   Report Status PENDING  Incomplete  Blood culture (routine x 2)     Status: None (Preliminary result)   Collection Time: 06/19/17  9:40 PM  Result Value Ref Range Status   Specimen Description BLOOD LEFT ANTECUBITAL  Final   Special Requests   Final    BOTTLES DRAWN AEROBIC AND ANAEROBIC Blood Culture adequate volume   Culture NO GROWTH 4 DAYS  Final   Report Status PENDING  Incomplete  MRSA PCR Screening     Status: Abnormal   Collection Time: 06/20/17  1:43 AM  Result Value Ref Range Status   MRSA by PCR POSITIVE (A) NEGATIVE Final    Comment:         The GeneXpert MRSA Assay (FDA approved for NASAL specimens only), is one component of a comprehensive MRSA colonization surveillance program. It is not intended to diagnose MRSA infection nor to guide or monitor treatment for MRSA infections. RESULT CALLED TO, READ BACK BY AND VERIFIED WITH: JACKIE PAGE ON 06/20/17 AT 0314 QSD     RADIOLOGY:  No results found.  Follow up with PCP in 1 week.  Management plans discussed with the patient, family and they are in agreement.  CODE STATUS:  Code Status History    Date Active Date Inactive Code Status Order ID Comments User Context   06/19/2017 11:27 PM 06/22/2017  6:20 PM Full Code [id number]  Lance Coon, MD Inpatient   07/01/2016 12:22 AM 07/04/2016  1:44 AM Full Code [id number]  Reubin Milan, MD Inpatient   04/08/2012  2:19 PM 04/09/2012  2:08 AM Full Code [id number]  Donzetta Matters., MD ED    Advance Directive Documentation     Most Recent Value  Type of Advance Directive  Living will  Pre-existing out of facility DNR order (yellow form or pink MOST form)  -  "MOST" Form in Place?  -      TOTAL TIME TAKING CARE OF THIS PATIENT ON DAY OF DISCHARGE: more than 30 minutes.   Hillary Bow R M.D on 06/23/2017 at 1:38 PM  Between 7am to 6pm - Pager - [phone]  After 6pm go to [email or web address] - password [email or web address] [facility]  Office  [phone]  CC: Primary care physician; Katheren Shams  Note: This dictation was prepared with Dragon dictation along with smaller phrase technology. Any transcriptional errors that result from this process are unintentional.

## 2017-06-24 LAB — CULTURE, BLOOD (ROUTINE X 2)
CULTURE: NO GROWTH
CULTURE: NO GROWTH
Special Requests: ADEQUATE
Special Requests: ADEQUATE

## 2017-11-14 ENCOUNTER — Encounter: Payer: Self-pay | Admitting: Emergency Medicine

## 2017-11-14 ENCOUNTER — Emergency Department: Payer: Medicare Other

## 2017-11-14 ENCOUNTER — Other Ambulatory Visit: Payer: Self-pay

## 2017-11-14 ENCOUNTER — Inpatient Hospital Stay
Admission: EM | Admit: 2017-11-14 | Discharge: 2017-11-19 | DRG: 294 | Disposition: A | Discharge status: Home Health | Payer: Medicare Other | Attending: Internal Medicine | Admitting: Internal Medicine

## 2017-11-14 DIAGNOSIS — C541 Malignant neoplasm of endometrium: Secondary | ICD-10-CM | POA: Diagnosis present

## 2017-11-14 DIAGNOSIS — I82402 Acute embolism and thrombosis of unspecified deep veins of left lower extremity: Secondary | ICD-10-CM | POA: Diagnosis not present

## 2017-11-14 DIAGNOSIS — K5900 Constipation, unspecified: Secondary | ICD-10-CM | POA: Diagnosis not present

## 2017-11-14 DIAGNOSIS — I823 Embolism and thrombosis of renal vein: Secondary | ICD-10-CM | POA: Diagnosis present

## 2017-11-14 DIAGNOSIS — Z86711 Personal history of pulmonary embolism: Secondary | ICD-10-CM | POA: Diagnosis not present

## 2017-11-14 DIAGNOSIS — Z8249 Family history of ischemic heart disease and other diseases of the circulatory system: Secondary | ICD-10-CM

## 2017-11-14 DIAGNOSIS — Z7901 Long term (current) use of anticoagulants: Secondary | ICD-10-CM

## 2017-11-14 DIAGNOSIS — D6832 Hemorrhagic disorder due to extrinsic circulating anticoagulants: Secondary | ICD-10-CM | POA: Diagnosis present

## 2017-11-14 DIAGNOSIS — I82432 Acute embolism and thrombosis of left popliteal vein: Secondary | ICD-10-CM | POA: Diagnosis present

## 2017-11-14 DIAGNOSIS — Z6841 Body Mass Index (BMI) 40.0 and over, adult: Secondary | ICD-10-CM | POA: Diagnosis not present

## 2017-11-14 DIAGNOSIS — K5909 Other constipation: Secondary | ICD-10-CM | POA: Diagnosis present

## 2017-11-14 DIAGNOSIS — I82412 Acute embolism and thrombosis of left femoral vein: Secondary | ICD-10-CM | POA: Diagnosis present

## 2017-11-14 DIAGNOSIS — Z9119 Patient's noncompliance with other medical treatment and regimen: Secondary | ICD-10-CM

## 2017-11-14 DIAGNOSIS — Z884 Allergy status to anesthetic agent status: Secondary | ICD-10-CM

## 2017-11-14 DIAGNOSIS — F419 Anxiety disorder, unspecified: Secondary | ICD-10-CM | POA: Diagnosis present

## 2017-11-14 DIAGNOSIS — T45515A Adverse effect of anticoagulants, initial encounter: Secondary | ICD-10-CM | POA: Diagnosis not present

## 2017-11-14 DIAGNOSIS — I82220 Acute embolism and thrombosis of inferior vena cava: Secondary | ICD-10-CM | POA: Diagnosis present

## 2017-11-14 DIAGNOSIS — Z79890 Hormone replacement therapy (postmenopausal): Secondary | ICD-10-CM

## 2017-11-14 DIAGNOSIS — Z800 Family history of malignant neoplasm of digestive organs: Secondary | ICD-10-CM | POA: Diagnosis not present

## 2017-11-14 DIAGNOSIS — I10 Essential (primary) hypertension: Secondary | ICD-10-CM | POA: Diagnosis present

## 2017-11-14 DIAGNOSIS — N938 Other specified abnormal uterine and vaginal bleeding: Secondary | ICD-10-CM | POA: Diagnosis present

## 2017-11-14 DIAGNOSIS — I824Z9 Acute embolism and thrombosis of unspecified deep veins of unspecified distal lower extremity: Secondary | ICD-10-CM

## 2017-11-14 DIAGNOSIS — I82409 Acute embolism and thrombosis of unspecified deep veins of unspecified lower extremity: Secondary | ICD-10-CM | POA: Diagnosis present

## 2017-11-14 DIAGNOSIS — M1712 Unilateral primary osteoarthritis, left knee: Secondary | ICD-10-CM | POA: Diagnosis present

## 2017-11-14 DIAGNOSIS — Z23 Encounter for immunization: Secondary | ICD-10-CM

## 2017-11-14 DIAGNOSIS — E119 Type 2 diabetes mellitus without complications: Secondary | ICD-10-CM | POA: Diagnosis not present

## 2017-11-14 DIAGNOSIS — I82422 Acute embolism and thrombosis of left iliac vein: Secondary | ICD-10-CM | POA: Diagnosis present

## 2017-11-14 DIAGNOSIS — E785 Hyperlipidemia, unspecified: Secondary | ICD-10-CM | POA: Diagnosis present

## 2017-11-14 DIAGNOSIS — R600 Localized edema: Secondary | ICD-10-CM | POA: Diagnosis not present

## 2017-11-14 DIAGNOSIS — Z888 Allergy status to other drugs, medicaments and biological substances status: Secondary | ICD-10-CM

## 2017-11-14 DIAGNOSIS — M79605 Pain in left leg: Secondary | ICD-10-CM | POA: Diagnosis not present

## 2017-11-14 DIAGNOSIS — I824Y2 Acute embolism and thrombosis of unspecified deep veins of left proximal lower extremity: Secondary | ICD-10-CM

## 2017-11-14 DIAGNOSIS — Z7401 Bed confinement status: Secondary | ICD-10-CM

## 2017-11-14 DIAGNOSIS — Z923 Personal history of irradiation: Secondary | ICD-10-CM

## 2017-11-14 DIAGNOSIS — Z881 Allergy status to other antibiotic agents status: Secondary | ICD-10-CM

## 2017-11-14 LAB — MRSA PCR SCREENING: MRSA by PCR: POSITIVE — AB

## 2017-11-14 LAB — COMPREHENSIVE METABOLIC PANEL
ALK PHOS: 98 U/L (ref 38–126)
ALT: 13 U/L — ABNORMAL LOW (ref 14–54)
ANION GAP: 10 (ref 5–15)
AST: 17 U/L (ref 15–41)
Albumin: 3.4 g/dL — ABNORMAL LOW (ref 3.5–5.0)
BILIRUBIN TOTAL: 0.6 mg/dL (ref 0.3–1.2)
BUN: 14 mg/dL (ref 6–20)
CALCIUM: 10.3 mg/dL (ref 8.9–10.3)
CO2: 27 mmol/L (ref 22–32)
Chloride: 98 mmol/L — ABNORMAL LOW (ref 101–111)
Creatinine, Ser: 0.77 mg/dL (ref 0.44–1.00)
GFR calc non Af Amer: >60 mL/min (ref >60)
Glucose, Bld: 172 mg/dL — ABNORMAL HIGH (ref 65–99)
POTASSIUM: 3.8 mmol/L (ref 3.5–5.1)
Sodium: 135 mmol/L (ref 135–145)
TOTAL PROTEIN: 7.5 g/dL (ref 6.5–8.1)

## 2017-11-14 LAB — CBC WITH DIFFERENTIAL/PLATELET
BASOS ABS: 0 10*3/uL (ref 0–0.1)
BASOS PCT: 0 %
Eosinophils Absolute: 0.2 10*3/uL (ref 0–0.7)
Eosinophils Relative: 2 %
HEMATOCRIT: 43.4 % (ref 35.0–47.0)
HEMOGLOBIN: 14.2 g/dL (ref 12.0–16.0)
LYMPHS PCT: 8 %
Lymphs Abs: 0.7 10*3/uL — ABNORMAL LOW (ref 1.0–3.6)
MCH: 30.4 pg (ref 26.0–34.0)
MCHC: 32.7 g/dL (ref 32.0–36.0)
MCV: 92.8 fL (ref 80.0–100.0)
Monocytes Absolute: 0.8 10*3/uL (ref 0.2–0.9)
Monocytes Relative: 8 %
NEUTROS PCT: 82 %
Neutro Abs: 7.2 10*3/uL — ABNORMAL HIGH (ref 1.4–6.5)
Platelets: 310 10*3/uL (ref 150–440)
RBC: 4.67 MIL/uL (ref 3.80–5.20)
RDW: 13.8 % (ref 11.5–14.5)
WBC: 8.9 10*3/uL (ref 3.6–11.0)

## 2017-11-14 LAB — APTT: APTT: 35 s (ref 24–36)

## 2017-11-14 LAB — PROTIME-INR
INR: 1.01
Prothrombin Time: 13.2 seconds (ref 11.4–15.2)

## 2017-11-14 LAB — GLUCOSE, CAPILLARY
GLUCOSE-CAPILLARY: 139 mg/dL — AB (ref 65–99)
Glucose-Capillary: 204 mg/dL — ABNORMAL HIGH (ref 65–99)

## 2017-11-14 LAB — HEPARIN LEVEL (UNFRACTIONATED): Heparin Unfractionated: <0.1 IU/mL — ABNORMAL LOW (ref 0.30–0.70)

## 2017-11-14 LAB — CK: Total CK: 40 U/L (ref 38–234)

## 2017-11-14 MED ORDER — HEPARIN (PORCINE) IN NACL 100-0.45 UNIT/ML-% IJ SOLN
1750.0000 [IU]/h | INTRAMUSCULAR | Status: DC
  Administered 2017-11-14: 1650 [IU]/h via INTRAVENOUS
  Administered 2017-11-15: 1750 [IU]/h via INTRAVENOUS
  Administered 2017-11-15: 1650 [IU]/h via INTRAVENOUS
  Administered 2017-11-16 – 2017-11-19 (×5): 1750 [IU]/h via INTRAVENOUS
  Filled 2017-11-14 (×8): qty 250

## 2017-11-14 MED ORDER — SODIUM CHLORIDE 0.9% FLUSH: 3.0000 mL | INTRAVENOUS | Status: DC | PRN

## 2017-11-14 MED ORDER — SIMVASTATIN 20 MG PO TABS
20.0000 mg | ORAL_TABLET | Freq: Every day | ORAL | Status: DC
  Administered 2017-11-15 – 2017-11-18 (×4): 20 mg via ORAL
  Filled 2017-11-14 (×5): qty 1

## 2017-11-14 MED ORDER — HYDROCODONE-ACETAMINOPHEN 5-325 MG PO TABS: 1.0000 | ORAL_TABLET | ORAL | Status: DC | PRN

## 2017-11-14 MED ORDER — MUPIROCIN 2 % EX OINT
1.0000 "application " | TOPICAL_OINTMENT | Freq: Two times a day (BID) | CUTANEOUS | Status: AC
  Administered 2017-11-14 – 2017-11-19 (×10): 1 via NASAL
  Filled 2017-11-14: qty 22

## 2017-11-14 MED ORDER — ACETAMINOPHEN 650 MG RE SUPP: 650.0000 mg | Freq: Four times a day (QID) | RECTAL | Status: DC | PRN

## 2017-11-14 MED ORDER — FLUTICASONE PROPIONATE 50 MCG/ACT NA SUSP
2.0000 | Freq: Every day | NASAL | Status: DC
  Administered 2017-11-15 – 2017-11-19 (×5): 2 via NASAL
  Filled 2017-11-14: qty 16

## 2017-11-14 MED ORDER — ACETAMINOPHEN 325 MG PO TABS: 650.0000 mg | ORAL_TABLET | Freq: Four times a day (QID) | ORAL | Status: DC | PRN

## 2017-11-14 MED ORDER — B COMPLEX PO TABS
1.0000 | ORAL_TABLET | Freq: Every day | ORAL | Status: DC
  Filled 2017-11-14: qty 1

## 2017-11-14 MED ORDER — CHLORHEXIDINE GLUCONATE CLOTH 2 % EX PADS
6.0000 | MEDICATED_PAD | Freq: Every day | CUTANEOUS | Status: DC
  Administered 2017-11-15 – 2017-11-18 (×4): 6 via TOPICAL

## 2017-11-14 MED ORDER — BISACODYL 10 MG RE SUPP: 10.0000 mg | Freq: Every day | RECTAL | Status: DC | PRN

## 2017-11-14 MED ORDER — PANTOPRAZOLE SODIUM 40 MG PO TBEC
40.0000 mg | DELAYED_RELEASE_TABLET | Freq: Every day | ORAL | Status: DC
  Administered 2017-11-15 – 2017-11-19 (×5): 40 mg via ORAL
  Filled 2017-11-14 (×5): qty 1

## 2017-11-14 MED ORDER — CLONAZEPAM 1 MG PO TABS
1.0000 mg | ORAL_TABLET | Freq: Three times a day (TID) | ORAL | Status: DC | PRN
  Administered 2017-11-14 – 2017-11-17 (×7): 1 mg via ORAL
  Filled 2017-11-14 (×7): qty 1

## 2017-11-14 MED ORDER — FUROSEMIDE 20 MG PO TABS
20.0000 mg | ORAL_TABLET | Freq: Every day | ORAL | Status: DC
  Administered 2017-11-15 – 2017-11-19 (×4): 20 mg via ORAL
  Filled 2017-11-14 (×4): qty 1

## 2017-11-14 MED ORDER — HEPARIN (PORCINE) IN NACL 100-0.45 UNIT/ML-% IJ SOLN: 15.0000 [IU]/kg/h | INTRAMUSCULAR | Status: DC

## 2017-11-14 MED ORDER — EZETIMIBE-SIMVASTATIN 10-20 MG PO TABS: 1.0000 | ORAL_TABLET | Freq: Every day | ORAL | Status: DC

## 2017-11-14 MED ORDER — EZETIMIBE 10 MG PO TABS
10.0000 mg | ORAL_TABLET | Freq: Every day | ORAL | Status: DC
  Administered 2017-11-15 – 2017-11-18 (×4): 10 mg via ORAL
  Filled 2017-11-14 (×5): qty 1

## 2017-11-14 MED ORDER — LEVOTHYROXINE SODIUM 50 MCG PO TABS
50.0000 ug | ORAL_TABLET | Freq: Every day | ORAL | Status: DC
  Administered 2017-11-15 – 2017-11-19 (×4): 50 ug via ORAL
  Filled 2017-11-14 (×5): qty 1

## 2017-11-14 MED ORDER — INSULIN ASPART 100 UNIT/ML ~~LOC~~ SOLN
0.0000 [IU] | Freq: Three times a day (TID) | SUBCUTANEOUS | Status: DC
  Administered 2017-11-15: 3 [IU] via SUBCUTANEOUS
  Administered 2017-11-15: 11 [IU] via SUBCUTANEOUS
  Administered 2017-11-15: 4 [IU] via SUBCUTANEOUS
  Administered 2017-11-16: 7 [IU] via SUBCUTANEOUS
  Administered 2017-11-16: 4 [IU] via SUBCUTANEOUS
  Administered 2017-11-17: 3 [IU] via SUBCUTANEOUS
  Administered 2017-11-17 – 2017-11-18 (×3): 4 [IU] via SUBCUTANEOUS
  Administered 2017-11-18: 3 [IU] via SUBCUTANEOUS
  Administered 2017-11-19: 4 [IU] via SUBCUTANEOUS
  Administered 2017-11-19: 7 [IU] via SUBCUTANEOUS
  Filled 2017-11-14 (×12): qty 1

## 2017-11-14 MED ORDER — ONDANSETRON HCL 4 MG/2ML IJ SOLN: 4.0000 mg | Freq: Four times a day (QID) | INTRAMUSCULAR | Status: DC | PRN

## 2017-11-14 MED ORDER — INFLUENZA VAC SPLIT HIGH-DOSE 0.5 ML IM SUSY
0.5000 mL | PREFILLED_SYRINGE | INTRAMUSCULAR | Status: AC
  Administered 2017-11-15: 0.5 mL via INTRAMUSCULAR
  Filled 2017-11-14: qty 0.5

## 2017-11-14 MED ORDER — FLEET ENEMA 7-19 GM/118ML RE ENEM
1.0000 | ENEMA | Freq: Once | RECTAL | Status: AC
  Administered 2017-11-14: 1 via RECTAL

## 2017-11-14 MED ORDER — SODIUM CHLORIDE 0.9 % IV SOLN: 250.0000 mL | INTRAVENOUS | Status: DC | PRN

## 2017-11-14 MED ORDER — ONE-DAILY MULTI VITAMINS PO TABS: 1.0000 | ORAL_TABLET | Freq: Every day | ORAL | Status: DC

## 2017-11-14 MED ORDER — ONDANSETRON HCL 4 MG PO TABS: 4.0000 mg | ORAL_TABLET | Freq: Four times a day (QID) | ORAL | Status: DC | PRN

## 2017-11-14 MED ORDER — LACTULOSE 10 GM/15ML PO SOLN
30.0000 g | Freq: Two times a day (BID) | ORAL | Status: AC
  Administered 2017-11-14 – 2017-11-15 (×2): 30 g via ORAL
  Administered 2017-11-15: 40 g via ORAL
  Administered 2017-11-16: 30 g via ORAL
  Filled 2017-11-14 (×4): qty 60

## 2017-11-14 MED ORDER — ENOXAPARIN SODIUM 100 MG/ML ~~LOC~~ SOLN: 1.0000 mg/kg | Freq: Once | SUBCUTANEOUS | Status: DC

## 2017-11-14 MED ORDER — INSULIN ASPART 100 UNIT/ML ~~LOC~~ SOLN
0.0000 [IU] | Freq: Every day | SUBCUTANEOUS | Status: DC
  Administered 2017-11-14: 2 [IU] via SUBCUTANEOUS
  Filled 2017-11-14: qty 1

## 2017-11-14 MED ORDER — HEPARIN BOLUS VIA INFUSION
5000.0000 [IU] | Freq: Once | INTRAVENOUS | Status: AC
  Administered 2017-11-14: 5000 [IU] via INTRAVENOUS
  Filled 2017-11-14: qty 5000

## 2017-11-14 MED ORDER — FLEET ENEMA 7-19 GM/118ML RE ENEM: 1.0000 | ENEMA | Freq: Once | RECTAL | Status: DC | PRN

## 2017-11-14 MED ORDER — LISINOPRIL 20 MG PO TABS
20.0000 mg | ORAL_TABLET | Freq: Every day | ORAL | Status: DC
  Administered 2017-11-15 – 2017-11-19 (×4): 20 mg via ORAL
  Filled 2017-11-14 (×4): qty 1

## 2017-11-14 MED ORDER — SENNOSIDES-DOCUSATE SODIUM 8.6-50 MG PO TABS: 1.0000 | ORAL_TABLET | Freq: Every evening | ORAL | Status: DC | PRN

## 2017-11-14 MED ORDER — ADULT MULTIVITAMIN W/MINERALS CH
1.0000 | ORAL_TABLET | Freq: Every day | ORAL | Status: DC
  Administered 2017-11-14 – 2017-11-19 (×5): 1 via ORAL
  Filled 2017-11-14 (×5): qty 1

## 2017-11-14 MED ORDER — SODIUM CHLORIDE 0.9% FLUSH
3.0000 mL | Freq: Two times a day (BID) | INTRAVENOUS | Status: DC
  Administered 2017-11-14 – 2017-11-19 (×9): 3 mL via INTRAVENOUS

## 2017-11-14 MED ORDER — BUPROPION HCL ER (XL) 150 MG PO TB24
450.0000 mg | ORAL_TABLET | Freq: Every day | ORAL | Status: DC
  Administered 2017-11-15 – 2017-11-19 (×5): 450 mg via ORAL
  Filled 2017-11-14 (×5): qty 3

## 2017-11-14 MED ORDER — DIPHENHYDRAMINE HCL 25 MG PO TABS
25.0000 mg | ORAL_TABLET | Freq: Four times a day (QID) | ORAL | Status: DC | PRN
  Filled 2017-11-14: qty 1

## 2017-11-14 MED ORDER — VENLAFAXINE HCL ER 75 MG PO CP24
300.0000 mg | ORAL_CAPSULE | Freq: Every day | ORAL | Status: DC
  Administered 2017-11-15 – 2017-11-19 (×5): 300 mg via ORAL
  Filled 2017-11-14 (×5): qty 4

## 2017-11-14 NOTE — H&P (Signed)
[place] [facility] [place] NAME: Theresa Blake    MR#:  [id number]  DATE OF BIRTH:  10-20-1941  DATE OF ADMISSION:  11/14/2017  PRIMARY CARE PHYSICIAN: Katheren Shams   REQUESTING/REFERRING PHYSICIAN:   CHIEF COMPLAINT:   Chief Complaint  Patient presents with  . Leg Pain  . Constipation    HISTORY OF PRESENT ILLNESS: Theresa Blake  is a 77 y.o. female with a known history per below, history of PE on Eliquis, chronic immobility/bedbound state for 6-8 months, presenting with acute left leg pain with associated swelling, pain with movement, brought via EMS, in the emergency room found to have extensive left lower extremity DVT extending into the pelvis, patient evaluated in the emergency room, family at the bedside, patient resting comfortably in bed, patient is now being admitted for acute extensive left lower extremity DVT.  PAST MEDICAL HISTORY:   Past Medical History:  Diagnosis Date  . Abnormal Pap smear of cervix   . Allergic rhinitis, cause unspecified   . Backache, unspecified   . Depression    mood disorder; ? bipolar  . Dermatomycosis, unspecified   . Diabetes mellitus type II   . Edema   . Foot fracture, right   . HLD (hyperlipidemia)   . HTN (hypertension)   . Hx pulmonary embolism    multiple  . Lumbar spinal stenosis   . Malignant neoplasm of corpus uteri, except isthmus ([place])   . Morbid obesity ([place])   . Other specified disease of hair and hair follicles   . Pain in joint, pelvic region and thigh   . Panic disorder without agoraphobia     PAST SURGICAL HISTORY:  Past Surgical History:  Procedure Laterality Date  . BREAST BIOPSY     Right-benign  . ENDOMETRIAL BIOPSY  08/2006   attempted  . TONSILLECTOMY    . TUBAL LIGATION      SOCIAL HISTORY:  Social History   Tobacco Use  . Smoking status: Never Smoker  . Smokeless tobacco: Never Used  Substance Use Topics  . Alcohol use: No    FAMILY HISTORY:   Family History  Problem Relation Age of Onset  . Heart attack Father 80  . Pancreatic cancer Mother        with mets  . Hyperlipidemia Son   . Hyperlipidemia Son   . Gout Son     DRUG ALLERGIES:  Allergies  Allergen Reactions  . Anesthetics, Halogenated Shortness Of Breath  . Levaquin [Levofloxacin In D5w] Itching  . Tramadol Other (See Comments)    Reaction: unknown  . Lamotrigine Itching    REVIEW OF SYSTEMS:   CONSTITUTIONAL: No fever, +fatigue or weakness.  Chronic immobility/bedbound state EYES: No blurred or double vision.  EARS, NOSE, AND THROAT: No tinnitus or ear pain.  RESPIRATORY: No cough, shortness of breath, wheezing or hemoptysis.  CARDIOVASCULAR: No chest pain, orthopnea, edema.  GASTROINTESTINAL: No nausea, vomiting, diarrhea or abdominal pain.  Severe constipation  gENITOURINARY: No dysuria, hematuria.  ENDOCRINE: No polyuria, nocturia,  HEMATOLOGY: No anemia, easy bruising or bleeding SKIN: No rash or lesion.  Left lower extremity swelling/pain with movement MUSCULOSKELETAL: No joint pain or arthritis.   NEUROLOGIC: No tingling, numbness, weakness.  PSYCHIATRY: No anxiety or depression.   MEDICATIONS AT HOME:  Prior to Admission medications   Medication Sig Start Date End Date Taking? Authorizing Provider  apixaban (ELIQUIS) 2.5 MG TABS tablet Take 2.5 mg by mouth 2 (two) times daily.  Yes [provider]  b complex vitamins tablet Take 1 tablet by mouth daily.   Yes [provider]  buPROPion (WELLBUTRIN XL) 150 MG 24 hr tablet Take 450 mg by mouth daily.    Yes [provider]  clonazePAM (KLONOPIN) 1 MG tablet Take 1 mg by mouth 3 (three) times daily as needed for anxiety. For anxiety   Yes [provider]  ezetimibe-simvastatin (VYTORIN) 10-20 MG tablet Take 1 tablet by mouth daily.   Yes [provider]  fluticasone (FLONASE) 50 MCG/ACT nasal spray Place 2 sprays into both nostrils daily.   Yes  [provider]  furosemide (LASIX) 20 MG tablet Take 20 mg by mouth daily. 06/17/16  Yes [provider]  levothyroxine (SYNTHROID, LEVOTHROID) 50 MCG tablet Take 1 tablet (50 mcg total) by mouth daily before breakfast. 02/28/14  Yes Einar Pheasant, MD  lisinopril (PRINIVIL,ZESTRIL) 20 MG tablet TAKE 1 TABLET BY MOUTH EVERY DAY 04/12/14  Yes Einar Pheasant, MD  Multiple Vitamin (MULTIVITAMIN) tablet Take 1 tablet by mouth daily.     Yes [provider]  omeprazole (PRILOSEC) 20 MG capsule TAKE ONE CAPSULE BY MOUTH EVERY DAY *NEEDS OFFICE VISIT*   Yes Einar Pheasant, MD  venlafaxine (EFFEXOR-XR) 150 MG 24 hr capsule Take 300 mg by mouth daily.     Yes [provider]  acetaminophen (TYLENOL) 500 MG tablet Take 1,000 mg by mouth every 6 (six) hours as needed for mild pain, moderate pain, fever or headache.    [provider]  Blood Glucose Monitoring Suppl (ACCU-CHEK AVIVA PLUS) W/DEVICE KIT by Does not apply route.    [provider]  diphenhydrAMINE (BENADRYL) 25 MG tablet Take 25 mg by mouth every 6 (six) hours as needed for allergies.    [provider]  glucose blood (ACCU-CHEK AVIVA PLUS) test strip 1 each by Other route as needed for other. Use as instructed    [provider]      PHYSICAL EXAMINATION:   VITAL SIGNS: Blood pressure (!) 160/104, pulse (!) 104, temperature 98.1 F (36.7 C), resp. rate (!) 23, height '5\' 7"'  (1.702 m), weight (!) 157.9 kg (348 lb), SpO2 93 %.  GENERAL:  77 y.o.-year-old patient lying in the bed with no acute distress.  Extreme morbid obesity EYES: Pupils equal, round, reactive to light and accommodation. No scleral icterus. Extraocular muscles intact.  HEENT: Head atraumatic, normocephalic. Oropharynx and nasopharynx clear.  NECK:  Supple, no jugular venous distention. No thyroid enlargement, no tenderness.  LUNGS: Normal breath sounds bilaterally, no wheezing, rales,rhonchi or  crepitation. No use of accessory muscles of respiration.  CARDIOVASCULAR: S1, S2 normal. No murmurs, rubs, or gallops.  ABDOMEN: Soft, nontender, nondistended. Bowel sounds present. No organomegaly or mass.  EXTREMITIES: No pedal edema, cyanosis, or clubbing.  NEUROLOGIC: Cranial nerves II through XII are intact. MAES. Gait not checked.  PSYCHIATRIC: The patient is alert and oriented x 3.  SKIN: No obvious rash, lesion, or ulcer.   LABORATORY PANEL:   CBC Recent Labs  Lab 11/14/17 1229  WBC 8.9  HGB 14.2  HCT 43.4  PLT 310  MCV 92.8  MCH 30.4  MCHC 32.7  RDW 13.8  LYMPHSABS 0.7*  MONOABS 0.8  EOSABS 0.2  BASOSABS 0.0   ------------------------------------------------------------------------------------------------------------------  Chemistries  Recent Labs  Lab 11/14/17 1229  NA 135  K 3.8  CL 98*  CO2 27  GLUCOSE 172*  BUN 14  CREATININE 0.77  CALCIUM 10.3  AST 17  ALT 13*  ALKPHOS 98  BILITOT 0.6   ------------------------------------------------------------------------------------------------------------------ estimated creatinine clearance is 96 mL/min (by C-G formula based on SCr of 0.77 mg/dL). ------------------------------------------------------------------------------------------------------------------ No results for input(s): TSH, T4TOTAL, T3FREE, THYROIDAB in the last 72 hours.  Invalid input(s): FREET3   Coagulation profile No results for input(s): INR, PROTIME in the last 168 hours. ------------------------------------------------------------------------------------------------------------------- No results for input(s): DDIMER in the last 72 hours. -------------------------------------------------------------------------------------------------------------------  Cardiac Enzymes No results for input(s): CKMB, TROPONINI, MYOGLOBIN in the last 168 hours.  Invalid input(s):  CK ------------------------------------------------------------------------------------------------------------------ Invalid input(s): POCBNP  ---------------------------------------------------------------------------------------------------------------  Urinalysis    Component Value Date/Time   COLORURINE YELLOW 04/08/2012 1622   APPEARANCEUR CLEAR 04/08/2012 1622   LABSPEC 1.016 04/08/2012 1622   PHURINE 8.0 04/08/2012 1622   GLUCOSEU NEGATIVE 04/08/2012 1622   HGBUR NEGATIVE 04/08/2012 1622   HGBUR negative 02/10/2010 1135   BILIRUBINUR neg 06/11/2013 1510   KETONESUR NEGATIVE 04/08/2012 1622   PROTEINUR neg 06/11/2013 1510   PROTEINUR NEGATIVE 04/08/2012 1622   UROBILINOGEN 0.2 06/11/2013 1510   UROBILINOGEN 1.0 04/08/2012 1622   NITRITE pos 06/11/2013 1510   NITRITE NEGATIVE 04/08/2012 1622   LEUKOCYTESUR moderate (2+) 06/11/2013 1510     RADIOLOGY: Dg Ankle Complete Left  Result Date: 11/14/2017 CLINICAL DATA:  Left leg pain. EXAM: LEFT ANKLE COMPLETE - 3+ VIEW COMPARISON:  None. FINDINGS: No acute fracture or malalignment. The talar dome is intact. The ankle mortise is symmetric. No tibiotalar joint effusion. Severe, diffuse osteopenia. Soft tissues are unremarkable. IMPRESSION: Negative. Electronically Signed   By: Titus Dubin M.D.   On: 11/14/2017 14:39   US Venous Img Lower  Left (dvt Study)  Result Date: 11/14/2017 CLINICAL DATA:  Left lower extremity pain and edema.  Recent fall. EXAM: LEFT LOWER EXTREMITY VENOUS DOPPLER ULTRASOUND TECHNIQUE: Gray-scale sonography with graded compression, as well as color Doppler and duplex ultrasound were performed to evaluate the lower extremity deep venous systems from the level of the common femoral vein and including the common femoral, femoral, profunda femoral, popliteal and calf veins including the posterior tibial, peroneal and gastrocnemius veins when visible. The superficial great saphenous vein was also interrogated.  Spectral Doppler was utilized to evaluate flow at rest and with distal augmentation maneuvers in the common femoral, femoral and popliteal veins. COMPARISON:  None. FINDINGS: Contralateral Common Femoral Vein: Respiratory phasicity is normal and symmetric with the symptomatic side. No evidence of thrombus. Normal compressibility. Common Femoral Vein: Occlusive thrombus identified. There may be minimal amount of flow in the common femoral vein. Saphenofemoral Junction: Thrombus present that extends into the upper thigh segment of the great saphenous vein. Profunda Femoral Vein: Occlusive thrombus identified. Femoral Vein: Occlusive thrombus identified. Popliteal Vein: Thrombus identified. There does appear to be some flow in the popliteal vein. Calf Veins: Due to body habitus, the tibial veins were not able to be visualized. Superficial Great Saphenous Vein: No evidence of thrombus. Normal compressibility. Venous Reflux:  None. Other Findings:  No abnormal fluid collections. IMPRESSION: Extensive deep venous thrombosis of the left lower extremity involving the common femoral vein, profunda femoral vein, femoral vein and popliteal vein. Thrombus appears essentially occlusive with some potential small amount of flow detected in the common femoral vein and popliteal vein. The calf veins are not visualized well by ultrasound. Thrombus extends into the upper thigh segment of the great saphenous vein. Electronically Signed   By: Aletta Edouard M.D.   On: 11/14/2017 15:27   Dg Knee Complete 4 Views Left  Result  Date: 11/14/2017 CLINICAL DATA:  Left leg pain. EXAM: LEFT KNEE - COMPLETE 4+ VIEW COMPARISON:  None. FINDINGS: No acute fracture or malalignment. Mild tricompartmental joint space narrowing with small marginal osteophytes. No significant joint effusion. Severe osteopenia. Soft tissues are unremarkable. IMPRESSION: 1. No acute osseous abnormality. Mild tricompartmental osteoarthritis. Electronically Signed   By:  Titus Dubin M.D.   On: 11/14/2017 14:42   Dg Hip Unilat With Pelvis 2-3 Views Left  Result Date: 11/14/2017 CLINICAL DATA:  Left leg pain. EXAM: DG HIP (WITH OR WITHOUT PELVIS) 2-3V LEFT COMPARISON:  CT pelvis dated June 21, 2017. FINDINGS: There is no evidence of hip fracture or dislocation. Mild bilateral hip joint space narrowing. The pubic symphysis and sacroiliac joints are intact. Degenerative changes of the lower lumbar spine. Osteopenia. IUD in the pelvis. Rectal tube in place. IMPRESSION: No acute osseous abnormality. Electronically Signed   By: Titus Dubin M.D.   On: 11/14/2017 14:41    EKG: Orders placed or performed during the hospital encounter of 06/19/17  . ED EKG  . ED EKG  . EKG 12-Lead  . EKG 12-Lead    IMPRESSION AND PLAN: 1 acute extensive left lower extremity DVT History of PE in the past, extreme morbid obesity, chronic immobility syndrome/bedbound state Admit to regular nursing floor bed, vascular surgery consulted for expert opinion-proceed with heparin drip/may require thrombolytic therapy, adult pain protocol, discontinue Eliquis, consider lifelong anticoagulation, and continue close medical monitoring  2 acute constipation No bowel movement in approximately 2 weeks Fleets enema x1, lactulose twice daily for now, and strict I&O monitoring  3 chronic extreme morbid obesity Most likely secondary to excess calories Lifestyle modification recommended  4 chronic diabetes mellitus type 2 Sliding scale insulin, ADA/cardiac diet, Accu-Cheks per routine  5 chronic anxiety disorder, NOS Stable Continue home regiment  Full code Condition stable Prognosis fair disposition pending clinical course In 3-4 days  All the records are reviewed and case discussed with ED provider. Management plans discussed with the patient, family and they are in agreement.  CODE STATUS: Code Status History    Date Active Date Inactive Code Status Order ID Comments User  Context   06/19/2017 23:27 06/22/2017 18:20 Full Code [id number]  Lance Coon, MD Inpatient   07/01/2016 00:22 07/04/2016 01:44 Full Code [id number]  Reubin Milan, MD Inpatient   04/08/2012 14:19 04/09/2012 02:08 Full Code [id number]  Donzetta Matters., MD ED       TOTAL TIME TAKING CARE OF THIS PATIENT: 45 minutes.    Avel Peace Theresa Blake M.D on 11/14/2017   Between 7am to 6pm - Pager - [phone]  After 6pm go to [email or web address] - password [email or web address] [facility]  Office  [phone]  CC: Primary care physician; Katheren Shams   Note: This dictation was prepared with Dragon dictation along with smaller phrase technology. Any transcriptional errors that result from this process are unintentional.

## 2017-11-14 NOTE — Progress Notes (Addendum)
ANTICOAGULATION CONSULT NOTE - Initial Consult  Pharmacy Consult for Heparin Indication: DVT  Allergies  Allergen Reactions  . Anesthetics, Halogenated Shortness Of Breath  . Levaquin [Levofloxacin In D5w] Itching  . Tramadol Other (See Comments)    Reaction: unknown  . Lamotrigine Itching    Patient Measurements: Height: 5\' 7"  (170.2 cm) Weight: (!) 348 lb (157.9 kg) IBW/kg (Calculated) : 61.6 Heparin Dosing Weight: 101.3 kg  Vital Signs: Temp: 98.1 F (36.7 C) (01/21 1217) BP: 160/104 (01/21 1400) Pulse Rate: 104 (01/21 1400)  Labs: Recent Labs    11/14/17 1229  HGB 14.2  HCT 43.4  PLT 310  CREATININE 0.77  CKTOTAL 40    Estimated Creatinine Clearance: 96 mL/min (by C-G formula based on SCr of 0.77 mg/dL).   Medical History: Past Medical History:  Diagnosis Date  . Abnormal Pap smear of cervix   . Allergic rhinitis, cause unspecified   . Backache, unspecified   . Depression    mood disorder; ? bipolar  . Dermatomycosis, unspecified   . Diabetes mellitus type II   . Edema   . Foot fracture, right   . HLD (hyperlipidemia)   . HTN (hypertension)   . Hx pulmonary embolism    multiple  . Lumbar spinal stenosis   . Malignant neoplasm of corpus uteri, except isthmus ([place])   . Morbid obesity ([place])   . Other specified disease of hair and hair follicles   . Pain in joint, pelvic region and thigh   . Panic disorder without agoraphobia       Assessment: 77 yo female with hx of PE on Eliquis now starting on heparin drip for extensive DVT. Lovenox ordered in ED not yet given now switching over to heparin per admitting hospitalist. Per pt in person, stated last dose of Eliquis was days ago (confirmed none in last 24h).   Stat baseline aPTT, INR, HL ordered Hgb 14.2, Plt 310 - WNL  Goal of Therapy:  Heparin level 0.3-0.7 units/ml Monitor platelets by anticoagulation protocol: Yes   Plan:  Heparin bolus 5000 units IV x1 then heparin drip at 1650 units/hr  (=16.5 ml/hr) Anticipate should be able to dose off of heparin levels since last dose was days ago. Will order aPTT and HL in 8h after drip started just in case.  CBC in AM  Informed ED RN that labs need to be drawn before hanging drip if possible  Pharmacy will continue to follow.   Rayna Sexton L 11/14/2017,4:40 PM   01/22 0200 heparin level 0.61, aPTT 120. Since baseline anti-Xa was undetectable, will continue to monitor and adjust by anti-Xa alone. Continue current regimen and recheck in 8 hours to confirm.  Sim Boast, PharmD, BCPS  11/15/17 3:27 AM

## 2017-11-14 NOTE — ED Triage Notes (Signed)
Pt presents to ED via [facility] with c/o L leg pain and constipation, per EMS pt has been bed bound x 5 days due leg pain and swelling. Per EMS pt has been constipated x 6 weeks, for the last week has been using her hand to clean herself with a bucket beside her. Pt lives at home with her son.

## 2017-11-14 NOTE — ED Notes (Signed)
Pt taken to Xray.

## 2017-11-14 NOTE — ED Provider Notes (Signed)
[facility] Emergency Department Provider Note  ____________________________________________   First MD Initiated Contact with Patient 11/14/17 1223     (approximate)  I have reviewed the triage vital signs and the nursing notes.   HISTORY  Chief Complaint Leg Pain and Constipation   HPI Theresa Blake is a 77 y.o. female who comes to the emergency department with 5 days of left leg pain and inability to bear weight.  She has a complex past medical history including morbid obesity and 5 days ago she rolled out of bed landing on her left side.  She has been unable to stand up bear weight or ambulate ever since.  She normally can stand up and walk several steps with a walker.  She is also noted increased swelling to her left lower extremity.  She has moderate to severe pain and "my entire leg".  She denies fevers or chills.  She denies chest pain or shortness of breath.  She denies abdominal pain nausea or vomiting.  She does report constipation that has been inadequately controlled with MiraLAX.  Past Medical History:  Diagnosis Date  . Abnormal Pap smear of cervix   . Allergic rhinitis, cause unspecified   . Backache, unspecified   . Depression    mood disorder; ? bipolar  . Dermatomycosis, unspecified   . Diabetes mellitus type II   . Edema   . Foot fracture, right   . HLD (hyperlipidemia)   . HTN (hypertension)   . Hx pulmonary embolism    multiple  . Lumbar spinal stenosis   . Malignant neoplasm of corpus uteri, except isthmus ([place])   . Morbid obesity ([place])   . Other specified disease of hair and hair follicles   . Pain in joint, pelvic region and thigh   . Panic disorder without agoraphobia     Patient Active Problem List   Diagnosis Date Noted  . DVT (deep venous thrombosis) ([place]) 11/14/2017  . Weakness   . Palliative care by specialist   . Vaginal bleeding 06/19/2017  . History of endometrial cancer   . Acute respiratory failure with  hypoxia ([place])   . Multiple rib fractures 06/30/2016  . Broken ribs 06/30/2016  . Goals of care, counseling/discussion 03/23/2014  . Hypoxia 04/29/2013  . Spinal stenosis of lumbar region 04/12/2012  . Neck strain 11/05/2011  . Wound of right leg 11/05/2011  . Cellulitis and abscess of leg 10/13/2011  . Sinusitis 10/13/2011  . Vertigo 10/13/2011  . Other screening mammogram 03/15/2011  . BACK PAIN, LUMBAR 12/22/2010  . HIP PAIN, RIGHT 12/21/2010  . ALLERGIC RHINITIS 07/22/2010  . Morbid obesity due to excess calories ([place]) 02/27/2010  . FUNGAL DERMATITIS 08/15/2009  . Hypothyroidism 07/13/2007  . Diabetes mellitus type 2 in obese ([place]) 03/24/2007  . Hyperlipidemia 03/24/2007  . PANIC DISORDER 03/24/2007  . Depression 03/24/2007  . Essential hypertension 03/24/2007  . FOLLICULITIS 74/05/1447  . BACK PAIN 03/24/2007  . EDEMA 03/24/2007  . PULMONARY EMBOLISM, HX OF 03/24/2007    Past Surgical History:  Procedure Laterality Date  . BREAST BIOPSY     Right-benign  . ENDOMETRIAL BIOPSY  08/2006   attempted  . TONSILLECTOMY    . TUBAL LIGATION      Prior to Admission medications   Medication Sig Start Date End Date Taking? Authorizing Provider  apixaban (ELIQUIS) 2.5 MG TABS tablet Take 2.5 mg by mouth 2 (two) times daily.   Yes [provider]  b complex vitamins  tablet Take 1 tablet by mouth daily.   Yes [provider]  buPROPion (WELLBUTRIN XL) 150 MG 24 hr tablet Take 450 mg by mouth daily.    Yes [provider]  clonazePAM (KLONOPIN) 1 MG tablet Take 1 mg by mouth 3 (three) times daily as needed for anxiety. For anxiety   Yes [provider]  ezetimibe-simvastatin (VYTORIN) 10-20 MG tablet Take 1 tablet by mouth daily.   Yes [provider]  fluticasone (FLONASE) 50 MCG/ACT nasal spray Place 2 sprays into both nostrils daily.   Yes [provider]  furosemide (LASIX) 20 MG tablet Take 20 mg by mouth daily. 06/17/16   Yes [provider]  levothyroxine (SYNTHROID, LEVOTHROID) 50 MCG tablet Take 1 tablet (50 mcg total) by mouth daily before breakfast. 02/28/14  Yes Einar Pheasant, MD  lisinopril (PRINIVIL,ZESTRIL) 20 MG tablet TAKE 1 TABLET BY MOUTH EVERY DAY 04/12/14  Yes Einar Pheasant, MD  Multiple Vitamin (MULTIVITAMIN) tablet Take 1 tablet by mouth daily.     Yes [provider]  omeprazole (PRILOSEC) 20 MG capsule TAKE ONE CAPSULE BY MOUTH EVERY DAY *NEEDS OFFICE VISIT*   Yes Einar Pheasant, MD  venlafaxine (EFFEXOR-XR) 150 MG 24 hr capsule Take 300 mg by mouth daily.     Yes [provider]  acetaminophen (TYLENOL) 500 MG tablet Take 1,000 mg by mouth every 6 (six) hours as needed for mild pain, moderate pain, fever or headache.    [provider]  Blood Glucose Monitoring Suppl (ACCU-CHEK AVIVA PLUS) W/DEVICE KIT by Does not apply route.    [provider]  diphenhydrAMINE (BENADRYL) 25 MG tablet Take 25 mg by mouth every 6 (six) hours as needed for allergies.    [provider]  glucose blood (ACCU-CHEK AVIVA PLUS) test strip 1 each by Other route as needed for other. Use as instructed    [provider]    Allergies Anesthetics, halogenated; Levaquin [levofloxacin in d5w]; Tramadol; and Lamotrigine  Family History  Problem Relation Age of Onset  . Heart attack Father 13  . Pancreatic cancer Mother        with mets  . Hyperlipidemia Son   . Hyperlipidemia Son   . Gout Son     Social History Social History   Tobacco Use  . Smoking status: Never Smoker  . Smokeless tobacco: Never Used  Substance Use Topics  . Alcohol use: No  . Drug use: No    Review of Systems Constitutional: No fever/chills Eyes: No visual changes. ENT: No sore throat. Cardiovascular: Denies chest pain. Respiratory: Denies shortness of breath. Gastrointestinal: No abdominal pain.  No nausea, no vomiting.  No diarrhea.  Positive for  constipation. Genitourinary: Negative for dysuria. Musculoskeletal: Positive for leg pain Skin: Negative for rash. Neurological: Negative for headaches, focal weakness or numbness.   ____________________________________________   PHYSICAL EXAM:  VITAL SIGNS: ED Triage Vitals  Enc Vitals Group     BP --      Pulse --      Resp --      Temp --      Temp src --      SpO2 11/14/17 1217 94 %     Weight 11/14/17 1221 (!) 348 lb (157.9 kg)     Height 11/14/17 1221 '5\' 7"'  (1.702 m)     Head Circumference --      Peak Flow --      Pain Score --      Pain  Loc --      Pain Edu? --      Excl. in [place]? --     Constitutional: Alert and oriented x4 tearful nontoxic no diaphoresis speaks in full clear sentences Eyes: PERRL EOMI. Head: Atraumatic. Nose: No congestion/rhinnorhea. Mouth/Throat: No trismus Neck: No stridor.   Cardiovascular: Normal rate, regular rhythm. Grossly normal heart sounds.  Good peripheral circulation. Respiratory: Normal respiratory effort.  No retractions. Lungs CTAB and moving good air Gastrointestinal: Morbidly obese soft nontender Musculoskeletal: Left leg considerably more edematous and erythematous than the right.  No acute bony tenderness appreciated although exam is extremely difficult secondary to body habitus.  No discomfort with logroll.  Able to internally and externally rotate left hip. Neurologic:  Normal speech and language. No gross focal neurologic deficits are appreciated. Skin:  Skin is warm, dry and intact. No rash noted. Psychiatric: Mood and affect are normal. Speech and behavior are normal.    ____________________________________________   DIFFERENTIAL includes but not limited to  Hip fracture, hip dislocation, cellulitis, deep vein thrombosis ____________________________________________   LABS (all labs ordered are listed, but only abnormal results are displayed)  Labs Reviewed  COMPREHENSIVE METABOLIC PANEL - Abnormal; Notable for  the following components:      Result Value   Chloride 98 (*)    Glucose, Bld 172 (*)    Albumin 3.4 (*)    ALT 13 (*)    All other components within normal limits  CBC WITH DIFFERENTIAL/PLATELET - Abnormal; Notable for the following components:   Neutro Abs 7.2 (*)    Lymphs Abs 0.7 (*)    All other components within normal limits  CK  APTT  PROTIME-INR    Lab work reviewed by me is unremarkable __________________________________________  EKG   ____________________________________________  RADIOLOGY  X-rays reviewed by me with no acute fracture Left lower extremity ultrasound reviewed by me with extensive deep vein thrombosis ____________________________________________   PROCEDURES  Procedure(s) performed: no  Procedures  Critical Care performed: no  Observation: no ____________________________________________   INITIAL IMPRESSION / ASSESSMENT AND PLAN / ED COURSE  Pertinent labs & imaging results that were available during my care of the patient were reviewed by me and considered in my medical decision making (see chart for details).  The patient arrives hemodynamically stable.  She has significant left lower extremity swelling and erythema concerning for fracture versus deep vein thrombosis.  X-rays and ultrasound are pending.     ----------------------------------------- 4:26 PM on 11/14/2017 -----------------------------------------  Fortunately the patient has no acute fracture.  She does have extensive deep vein thrombosis and is unable to ambulate.  At this point I will anticoagulate her with Lovenox and she will require inpatient admission for hematology consultation in the morning.  I discussed with the patient and family who verbalized understanding and agreement with the plan.  I then discussed with the hospitalist who is graciously agreed to admit the patient to his service. ____________________________________________   FINAL CLINICAL  IMPRESSION(S) / ED DIAGNOSES  Final diagnoses:  Osteoarthritis of left knee, unspecified osteoarthritis type  Acute deep vein thrombosis (DVT) of femoral vein of left lower extremity (HCC)      NEW MEDICATIONS STARTED DURING THIS VISIT:  New Prescriptions   No medications on file     Note:  This document was prepared using Dragon voice recognition software and may include unintentional dictation errors.     Darel Hong, MD 11/14/17 1627

## 2017-11-14 NOTE — ED Notes (Signed)
Dr. Jerelyn Charles at bedside at this time.

## 2017-11-15 DIAGNOSIS — K5900 Constipation, unspecified: Secondary | ICD-10-CM

## 2017-11-15 DIAGNOSIS — E119 Type 2 diabetes mellitus without complications: Secondary | ICD-10-CM

## 2017-11-15 DIAGNOSIS — R600 Localized edema: Secondary | ICD-10-CM

## 2017-11-15 DIAGNOSIS — I82402 Acute embolism and thrombosis of unspecified deep veins of left lower extremity: Secondary | ICD-10-CM

## 2017-11-15 DIAGNOSIS — M79605 Pain in left leg: Secondary | ICD-10-CM

## 2017-11-15 LAB — CBC
HEMATOCRIT: 39.1 % (ref 35.0–47.0)
Hemoglobin: 13 g/dL (ref 12.0–16.0)
MCH: 30.7 pg (ref 26.0–34.0)
MCHC: 33.3 g/dL (ref 32.0–36.0)
MCV: 92.1 fL (ref 80.0–100.0)
Platelets: 311 10*3/uL (ref 150–440)
RBC: 4.25 MIL/uL (ref 3.80–5.20)
RDW: 13.8 % (ref 11.5–14.5)
WBC: 9.6 10*3/uL (ref 3.6–11.0)

## 2017-11-15 LAB — HEPARIN LEVEL (UNFRACTIONATED)
HEPARIN UNFRACTIONATED: 0.31 [IU]/mL (ref 0.30–0.70)
HEPARIN UNFRACTIONATED: 0.59 [IU]/mL (ref 0.30–0.70)
Heparin Unfractionated: 0.61 IU/mL (ref 0.30–0.70)

## 2017-11-15 LAB — BASIC METABOLIC PANEL
Anion gap: 8 (ref 5–15)
BUN: 14 mg/dL (ref 6–20)
CALCIUM: 10 mg/dL (ref 8.9–10.3)
CHLORIDE: 99 mmol/L — AB (ref 101–111)
CO2: 27 mmol/L (ref 22–32)
CREATININE: 0.8 mg/dL (ref 0.44–1.00)
GFR calc Af Amer: >60 mL/min (ref >60)
GFR calc non Af Amer: >60 mL/min (ref >60)
GLUCOSE: 201 mg/dL — AB (ref 65–99)
Potassium: 3.5 mmol/L (ref 3.5–5.1)
Sodium: 134 mmol/L — ABNORMAL LOW (ref 135–145)

## 2017-11-15 LAB — GLUCOSE, CAPILLARY
GLUCOSE-CAPILLARY: 261 mg/dL — AB (ref 65–99)
GLUCOSE-CAPILLARY: 121 mg/dL — AB (ref 65–99)
Glucose-Capillary: 180 mg/dL — ABNORMAL HIGH (ref 65–99)
Glucose-Capillary: 164 mg/dL — ABNORMAL HIGH (ref 65–99)

## 2017-11-15 LAB — APTT: aPTT: 120 seconds — ABNORMAL HIGH (ref 24–36)

## 2017-11-15 MED ORDER — B COMPLEX-C PO TABS
1.0000 | ORAL_TABLET | Freq: Every day | ORAL | Status: DC
  Administered 2017-11-15 – 2017-11-19 (×5): 1 via ORAL
  Filled 2017-11-15 (×5): qty 1

## 2017-11-15 NOTE — Consult Note (Signed)
[facility] Consult Note  MRN : [id number]  Theresa Blake is a 77 y.o. (02/26/1941) female who presents with chief complaint of  Chief Complaint  Patient presents with  . Leg Pain  . Constipation  .  History of Present Illness: I am asked to see the patient by Dr Jerelyn Charles for extensive DVT of the left leg.  She presented to the  emergency department with 5 days of left leg pain and inability to bear weight.  She has a complex past medical history including morbid obesity and 5 days ago she rolled out of bed landing on her left side.  She has been unable to stand up bear weight or ambulate ever since.  She normally can stand up and walk several steps with a walker.  She is also noted increased swelling to her left lower extremity.  She has moderate to severe pain and "my entire leg".  She denies fevers or chills.  She denies chest pain or shortness of breath.  She states that she stopped her Eliquis  Weeks ago because she has had a problem with vaginal bleeding.  She relates that she was asked to see Dr Sabra Heck "awhile ago" but that didn't seem to have happened.  She ended up in [place] but was very unhappy with the care she received.   Current Facility-Administered Medications  Medication Dose Route Frequency Provider Last Rate Last Dose  . 0.9 %  sodium chloride infusion  250 mL Intravenous PRN Salary, Montell D, MD      . acetaminophen (TYLENOL) tablet 650 mg  650 mg Oral Q6H PRN Salary, Montell D, MD       Or  . acetaminophen (TYLENOL) suppository 650 mg  650 mg Rectal Q6H PRN Salary, Montell D, MD      . B-complex with vitamin C tablet 1 tablet  1 tablet Oral Daily Salary, Montell D, MD   1 tablet at 11/15/17 1106  . bisacodyl (DULCOLAX) suppository 10 mg  10 mg Rectal Daily PRN Salary, Montell D, MD      . buPROPion (WELLBUTRIN XL) 24 hr tablet 450 mg  450 mg Oral Daily Salary, Montell D, MD   450 mg at 11/15/17 1030  . Chlorhexidine Gluconate Cloth 2 % PADS  6 each  6 each Topical Q0600 Salary, Avel Peace, MD   6 each at 11/15/17 0647  . clonazePAM (KLONOPIN) tablet 1 mg  1 mg Oral TID PRN Salary, Holly Bodily D, MD   1 mg at 11/15/17 1058  . diphenhydrAMINE (BENADRYL) tablet 25 mg  25 mg Oral Q6H PRN Salary, Montell D, MD      . ezetimibe (ZETIA) tablet 10 mg  10 mg Oral q1800 Salary, Montell D, MD   10 mg at 11/15/17 1710   And  . simvastatin (ZOCOR) tablet 20 mg  20 mg Oral q1800 Salary, Montell D, MD   20 mg at 11/15/17 1712  . fluticasone (FLONASE) 50 MCG/ACT nasal spray 2 spray  2 spray Each Nare Daily Salary, Montell D, MD   2 spray at 11/15/17 1100  . furosemide (LASIX) tablet 20 mg  20 mg Oral Daily Salary, Montell D, MD   20 mg at 11/15/17 1038  . heparin ADULT infusion 100 units/mL (25000 units/249mL sodium chloride 0.45%)  1,750 Units/hr Intravenous Continuous Fritzi Mandes, MD 17.5 mL/hr at 11/15/17 1316 1,750 Units/hr at 11/15/17 1316  . HYDROcodone-acetaminophen (NORCO/VICODIN) 5-325 MG per tablet 1-2 tablet  1-2 tablet Oral Q4H  PRN Salary, Holly Bodily D, MD      . insulin aspart (novoLOG) injection 0-20 Units  0-20 Units Subcutaneous TID WC Salary, Montell D, MD   3 Units at 11/15/17 1714  . insulin aspart (novoLOG) injection 0-5 Units  0-5 Units Subcutaneous QHS Loney Hering D, MD   2 Units at 11/14/17 2143  . lactulose (CHRONULAC) 10 GM/15ML solution 30 g  30 g Oral BID Salary, Holly Bodily D, MD   40 g at 11/15/17 1118  . levothyroxine (SYNTHROID, LEVOTHROID) tablet 50 mcg  50 mcg Oral QAC breakfast Salary, Montell D, MD   50 mcg at 11/15/17 0845  . lisinopril (PRINIVIL,ZESTRIL) tablet 20 mg  20 mg Oral Daily Salary, Montell D, MD   20 mg at 11/15/17 1039  . multivitamin with minerals tablet 1 tablet  1 tablet Oral Daily Salary, Montell D, MD   1 tablet at 11/15/17 1014  . mupirocin ointment (BACTROBAN) 2 % 1 application  1 application Nasal BID Salary, Avel Peace, MD   1 application at 04/17/75 1104  . ondansetron (ZOFRAN) tablet 4 mg  4 mg Oral  Q6H PRN Salary, Montell D, MD       Or  . ondansetron (ZOFRAN) injection 4 mg  4 mg Intravenous Q6H PRN Salary, Montell D, MD      . pantoprazole (PROTONIX) EC tablet 40 mg  40 mg Oral Daily Salary, Montell D, MD   40 mg at 11/15/17 1047  . senna-docusate (Senokot-S) tablet 1 tablet  1 tablet Oral QHS PRN Salary, Montell D, MD      . sodium chloride flush (NS) 0.9 % injection 3 mL  3 mL Intravenous Q12H Salary, Montell D, MD   3 mL at 11/15/17 1126  . sodium chloride flush (NS) 0.9 % injection 3 mL  3 mL Intravenous PRN Salary, Montell D, MD      . sodium phosphate (FLEET) 7-19 GM/118ML enema 1 enema  1 enema Rectal Once PRN Salary, Montell D, MD      . venlafaxine XR (EFFEXOR-XR) 24 hr capsule 300 mg  300 mg Oral Daily Salary, Montell D, MD   300 mg at 11/15/17 1041    Past Medical History:  Diagnosis Date  . Abnormal Pap smear of cervix   . Allergic rhinitis, cause unspecified   . Backache, unspecified   . Depression    mood disorder; ? bipolar  . Dermatomycosis, unspecified   . Diabetes mellitus type II   . Edema   . Foot fracture, right   . HLD (hyperlipidemia)   . HTN (hypertension)   . Hx pulmonary embolism    multiple  . Lumbar spinal stenosis   . Malignant neoplasm of corpus uteri, except isthmus ([place])   . Morbid obesity ([place])   . Other specified disease of hair and hair follicles   . Pain in joint, pelvic region and thigh   . Panic disorder without agoraphobia     Past Surgical History:  Procedure Laterality Date  . BREAST BIOPSY     Right-benign  . ENDOMETRIAL BIOPSY  08/2006   attempted  . TONSILLECTOMY    . TUBAL LIGATION      Social History Social History   Tobacco Use  . Smoking status: Never Smoker  . Smokeless tobacco: Never Used  Substance Use Topics  . Alcohol use: No  . Drug use: No    Family History Family History  Problem Relation Age of Onset  . Heart attack Father 70  .  Pancreatic cancer Mother        with mets  . Hyperlipidemia Son    . Hyperlipidemia Son   . Gout Son   No family history of bleeding/clotting disorders, porphyria or autoimmune disease   Allergies  Allergen Reactions  . Anesthetics, Halogenated Shortness Of Breath  . Levaquin [Levofloxacin In D5w] Itching  . Tramadol Other (See Comments)    Reaction: unknown  . Lamotrigine Itching     REVIEW OF SYSTEMS (Negative unless checked)  Constitutional: [] Weight loss  [] Fever  [] Chills Cardiac: [] Chest pain   [] Chest pressure   [] Palpitations   [] Shortness of breath when laying flat   [] Shortness of breath at rest   [] Shortness of breath with exertion. Vascular:  [] Pain in legs with walking   [] Pain in legs at rest   [] Pain in legs when laying flat   [] Claudication   [] Pain in feet when walking  [] Pain in feet at rest  [] Pain in feet when laying flat   [x] History of DVT   [] Phlebitis   [x] Swelling in legs   [] Varicose veins   [] Non-healing ulcers Pulmonary:   [] Uses home oxygen   [] Productive cough   [] Hemoptysis   [] Wheeze  [] COPD   [] Asthma Neurologic:  [] Dizziness  [] Blackouts   [] Seizures   [] History of stroke   [] History of TIA  [] Aphasia   [] Temporary blindness   [] Dysphagia   [] Weakness or numbness in arms   [] Weakness or numbness in legs Musculoskeletal:  [] Arthritis   [] Joint swelling   [] Joint pain   [] Low back pain Hematologic:  [] Easy bruising  [] Easy bleeding   [] Hypercoagulable state   [] Anemic  [] Hepatitis Gastrointestinal:  [] Blood in stool   [] Vomiting blood  [] Gastroesophageal reflux/heartburn   [] Difficulty swallowing. Genitourinary:  [] Chronic kidney disease   [] Difficult urination  [] Frequent urination  [] Burning with urination   [] Blood in urine Skin:  [] Rashes   [] Ulcers   [] Wounds Psychological:  [] History of anxiety   []  History of major depression.  Physical Examination  Vitals:   11/15/17 0434 11/15/17 1000 11/15/17 1028 11/15/17 1520  BP: (!) 150/63  (!) 142/67 (!) 162/85  Pulse: 100  (!) 107 (!) 109  Resp: 20  18 (!) 22   Temp: 97.6 F (36.4 C)  98.3 F (36.8 C) 98.3 F (36.8 C)  TempSrc: Oral   Oral  SpO2: 95% 94%  93%  Weight:      Height:       Body mass index is 54.5 kg/m. Gen:  WD/WN, NAD Head: [place]/AT, No temporalis wasting. Prominent temp pulse not noted. Ear/Nose/Throat: Hearing grossly intact, nares w/o erythema or drainage, oropharynx w/o Erythema/Exudate Eyes: Sclera non-icteric, conjunctiva clear Neck: Trachea midline.  No JVD.  Pulmonary:  Good air movement, respirations not labored, equal bilaterally.  Cardiac: RRR, normal S1, S2. Vascular:  Left leg is massively swollen toes are pink with brisk cap refill foot is warm Vessel Right Left  Radial Palpable Palpable  PT Palpable Trace Palpable  DP Palpable Trace Palpable   Gastrointestinal: soft, non-tender/non-distended. No guarding/reflex.  Musculoskeletal: M/S 5/5 throughout.  Extremities without ischemic changes.  No deformity or atrophy. No edema. Neurologic: Sensation grossly intact in extremities.  Symmetrical.  Speech is fluent. Motor exam as listed above. Psychiatric: Judgment intact, Mood & affect appropriate for pt's clinical situation. Dermatologic: No rashes or ulcers noted.  No cellulitis or open wounds. Lymph : No Cervical, Axillary, or Inguinal lymphadenopathy.      CBC Lab Results  Component Value  Date   WBC 9.6 11/15/2017   HGB 13.0 11/15/2017   HCT 39.1 11/15/2017   MCV 92.1 11/15/2017   PLT 311 11/15/2017    BMET    Component Value Date/Time   NA 134 (L) 11/15/2017 0216   K 3.5 11/15/2017 0216   CL 99 (L) 11/15/2017 0216   CO2 27 11/15/2017 0216   GLUCOSE 201 (H) 11/15/2017 0216   BUN 14 11/15/2017 0216   CREATININE 0.80 11/15/2017 0216   CALCIUM 10.0 11/15/2017 0216   GFRNONAA >60 11/15/2017 0216   GFRAA >60 11/15/2017 0216   Estimated Creatinine Clearance: 96 mL/min (by C-G formula based on SCr of 0.8 mg/dL).  COAG Lab Results  Component Value Date   INR 1.01 11/14/2017   INR 1.52  07/03/2016   INR 1.41 07/02/2016    Radiology Dg Ankle Complete Left  Result Date: 11/14/2017 CLINICAL DATA:  Left leg pain. EXAM: LEFT ANKLE COMPLETE - 3+ VIEW COMPARISON:  None. FINDINGS: No acute fracture or malalignment. The talar dome is intact. The ankle mortise is symmetric. No tibiotalar joint effusion. Severe, diffuse osteopenia. Soft tissues are unremarkable. IMPRESSION: Negative. Electronically Signed   By: Titus Dubin M.D.   On: 11/14/2017 14:39   US Venous Img Lower  Left (dvt Study)  Result Date: 11/14/2017 CLINICAL DATA:  Left lower extremity pain and edema.  Recent fall. EXAM: LEFT LOWER EXTREMITY VENOUS DOPPLER ULTRASOUND TECHNIQUE: Gray-scale sonography with graded compression, as well as color Doppler and duplex ultrasound were performed to evaluate the lower extremity deep venous systems from the level of the common femoral vein and including the common femoral, femoral, profunda femoral, popliteal and calf veins including the posterior tibial, peroneal and gastrocnemius veins when visible. The superficial great saphenous vein was also interrogated. Spectral Doppler was utilized to evaluate flow at rest and with distal augmentation maneuvers in the common femoral, femoral and popliteal veins. COMPARISON:  None. FINDINGS: Contralateral Common Femoral Vein: Respiratory phasicity is normal and symmetric with the symptomatic side. No evidence of thrombus. Normal compressibility. Common Femoral Vein: Occlusive thrombus identified. There may be minimal amount of flow in the common femoral vein. Saphenofemoral Junction: Thrombus present that extends into the upper thigh segment of the great saphenous vein. Profunda Femoral Vein: Occlusive thrombus identified. Femoral Vein: Occlusive thrombus identified. Popliteal Vein: Thrombus identified. There does appear to be some flow in the popliteal vein. Calf Veins: Due to body habitus, the tibial veins were not able to be visualized. Superficial  Great Saphenous Vein: No evidence of thrombus. Normal compressibility. Venous Reflux:  None. Other Findings:  No abnormal fluid collections. IMPRESSION: Extensive deep venous thrombosis of the left lower extremity involving the common femoral vein, profunda femoral vein, femoral vein and popliteal vein. Thrombus appears essentially occlusive with some potential small amount of flow detected in the common femoral vein and popliteal vein. The calf veins are not visualized well by ultrasound. Thrombus extends into the upper thigh segment of the great saphenous vein. Electronically Signed   By: Aletta Edouard M.D.   On: 11/14/2017 15:27   Dg Knee Complete 4 Views Left  Result Date: 11/14/2017 CLINICAL DATA:  Left leg pain. EXAM: LEFT KNEE - COMPLETE 4+ VIEW COMPARISON:  None. FINDINGS: No acute fracture or malalignment. Mild tricompartmental joint space narrowing with small marginal osteophytes. No significant joint effusion. Severe osteopenia. Soft tissues are unremarkable. IMPRESSION: 1. No acute osseous abnormality. Mild tricompartmental osteoarthritis. Electronically Signed   By: Titus Dubin M.D.   On:  11/14/2017 14:42   Dg Hip Unilat With Pelvis 2-3 Views Left  Result Date: 11/14/2017 CLINICAL DATA:  Left leg pain. EXAM: DG HIP (WITH OR WITHOUT PELVIS) 2-3V LEFT COMPARISON:  CT pelvis dated June 21, 2017. FINDINGS: There is no evidence of hip fracture or dislocation. Mild bilateral hip joint space narrowing. The pubic symphysis and sacroiliac joints are intact. Degenerative changes of the lower lumbar spine. Osteopenia. IUD in the pelvis. Rectal tube in place. IMPRESSION: No acute osseous abnormality. Electronically Signed   By: Titus Dubin M.D.   On: 11/14/2017 14:41      Assessment/Plan 1 DVT:  Continue heparin will plan for thombectmy  2acute constipation No bowel movement in approximately 2 weeks Fleets enema x1, lactulose twice daily for now, and strict I&O  monitoring  3chronic extreme morbid obesity  Most likely secondary to excess calories Lifestyle modification recommended  4chronic diabetes mellitus type 2 Sliding scale insulin, ADA/cardiac diet, Accu-Cheks per routine  5chronic anxiety disorder, NOS Stable Continue home regimen    Hortencia Pilar, MD  11/15/2017 7:56 PM    This note was created with Dragon medical transcription system.  Any error is purely unintentional

## 2017-11-15 NOTE — Progress Notes (Addendum)
ANTICOAGULATION CONSULT NOTE - Initial Consult  Pharmacy Consult for Heparin Indication: DVT  Allergies  Allergen Reactions  . Anesthetics, Halogenated Shortness Of Breath  . Levaquin [Levofloxacin In D5w] Itching  . Tramadol Other (See Comments)    Reaction: unknown  . Lamotrigine Itching    Patient Measurements: Height: 5\' 7"  (170.2 cm) Weight: (!) 348 lb (157.9 kg) IBW/kg (Calculated) : 61.6 Heparin Dosing Weight: 101.3 kg  Vital Signs: Temp: 98.6 F (37 C) (01/22 2057) Temp Source: Oral (01/22 2057) BP: 137/68 (01/22 2057) Pulse Rate: 108 (01/22 2057)  Labs: Recent Labs    11/14/17 1229  11/14/17 1723 11/15/17 0216 11/15/17 0952 11/15/17 2110  HGB 14.2  --   --  13.0  --   --   HCT 43.4  --   --  39.1  --   --   PLT 310  --   --  311  --   --   APTT  --   --  35 120*  --   --   LABPROT  --   --  13.2  --   --   --   INR  --   --  1.01  --   --   --   HEPARINUNFRC  --    < > <0.10* 0.61 0.31 0.59  CREATININE 0.77  --   --  0.80  --   --   CKTOTAL 40  --   --   --   --   --    < > = values in this interval not displayed.    Estimated Creatinine Clearance: 96 mL/min (by C-G formula based on SCr of 0.8 mg/dL).   Medical History: Past Medical History:  Diagnosis Date  . Abnormal Pap smear of cervix   . Allergic rhinitis, cause unspecified   . Backache, unspecified   . Depression    mood disorder; ? bipolar  . Dermatomycosis, unspecified   . Diabetes mellitus type II   . Edema   . Foot fracture, right   . HLD (hyperlipidemia)   . HTN (hypertension)   . Hx pulmonary embolism    multiple  . Lumbar spinal stenosis   . Malignant neoplasm of corpus uteri, except isthmus ([place])   . Morbid obesity ([place])   . Other specified disease of hair and hair follicles   . Pain in joint, pelvic region and thigh   . Panic disorder without agoraphobia       Assessment: 77 yo female with hx of PE on Eliquis now starting on heparin drip for extensive DVT. Lovenox  ordered in ED not yet given now switching over to heparin per admitting hospitalist. Per pt in person, stated last dose of Eliquis was days ago (confirmed none in last 24h).   Stat baseline aPTT, INR, HL ordered Hgb 14.2, Plt 310 - WNL  Goal of Therapy:  Heparin level 0.3-0.7 units/ml Monitor platelets by anticoagulation protocol: Yes   Plan:  Heparin bolus 5000 units IV x1 then heparin drip at 1650 units/hr (=16.5 ml/hr) Anticipate should be able to dose off of heparin levels since last dose was days ago. Will order aPTT and HL in 8h after drip started just in case.  CBC in AM  Informed ED RN that labs need to be drawn before hanging drip if possible  Pharmacy will continue to follow.   Stephinie Battisti S 11/15/2017,9:54 PM   01/22 0200 heparin level 0.61, aPTT 120. Since baseline anti-Xa was undetectable, will continue  to monitor and adjust by anti-Xa alone. Continue current regimen and recheck in 8 hours to confirm.  01/22 @ 1245 HL 0.31 With HL at lower end of goal range, will increase infusion to 1750 units/hr and recheck a HL in 8 hours.   1/22 2100 heparin level 0.59. Continue current regimen. Recheck heparin level and CBC with tomorrow AM labs.  1/23 AM heparin level 0.64. Continue current regimen. Recheck heparin level and CBC with tomorrow AM labs.  Sim Boast, PharmD, BCPS  11/15/17 9:55 PM

## 2017-11-15 NOTE — Progress Notes (Signed)
[facility] [place] NAME: Theresa Blake    MR#:  [id number]  DATE OF BIRTH:  04-Sep-1941  SUBJECTIVE:  Patient has been bedbound for 6-8 months.  Son takes care of her at home.  Came in with significant left lower extremity swelling and pain.  Was found to have extensive left lower extremity DVT. c/o constipation.  REVIEW OF SYSTEMS:   Review of Systems  Constitutional: Negative for chills, fever and weight loss.  HENT: Negative for ear discharge, ear pain and nosebleeds.   Eyes: Negative for blurred vision, pain and discharge.  Respiratory: Negative for sputum production, shortness of breath, wheezing and stridor.   Cardiovascular: Negative for chest pain, palpitations, orthopnea and PND.  Gastrointestinal: Positive for constipation. Negative for abdominal pain, diarrhea, nausea and vomiting.  Genitourinary: Negative for frequency and urgency.  Musculoskeletal: Negative for back pain and joint pain.  Neurological: Positive for weakness. Negative for sensory change, speech change and focal weakness.  Psychiatric/Behavioral: Negative for depression and hallucinations. The patient is not nervous/anxious.    Tolerating Diet:yes Tolerating PT: Patient is morbidly obese and bedbound for 6-8 months  DRUG ALLERGIES:   Allergies  Allergen Reactions  . Anesthetics, Halogenated Shortness Of Breath  . Levaquin [Levofloxacin In D5w] Itching  . Tramadol Other (See Comments)    Reaction: unknown  . Lamotrigine Itching    VITALS:  Blood pressure (!) 142/67, pulse (!) 107, temperature 98.3 F (36.8 C), resp. rate 20, height 5\' 7"  (1.702 m), weight (!) 157.9 kg (348 lb), SpO2 94 %.  PHYSICAL EXAMINATION:   Physical Exam  GENERAL:  77 y.o.-year-old patient lying in the bed with no acute distress. Morbidly obese EYES: Pupils equal, round, reactive to light and accommodation. No scleral icterus. Extraocular muscles intact.  HEENT: Head  atraumatic, normocephalic. Oropharynx and nasopharynx clear.  NECK:  Supple, no jugular venous distention. No thyroid enlargement, no tenderness.  LUNGS: Normal breath sounds bilaterally, no wheezing, rales, rhonchi. No use of accessory muscles of respiration.  CARDIOVASCULAR: S1, S2 normal. No murmurs, rubs, or gallops.  ABDOMEN: Soft, nontender, nondistended. Bowel sounds present. No organomegaly or mass.  EXTREMITIES: severe +++ edema b/l.    NEUROLOGIC: Cranial nerves II through XII are intact. No focal Motor or sensory deficits b/l.   PSYCHIATRIC:  patient is alert and oriented x 3.  SKIN: No obvious rash, lesion, or ulcer.   LABORATORY PANEL:  CBC Recent Labs  Lab 11/15/17 0216  WBC 9.6  HGB 13.0  HCT 39.1  PLT 311    Chemistries  Recent Labs  Lab 11/14/17 1229 11/15/17 0216  NA 135 134*  K 3.8 3.5  CL 98* 99*  CO2 27 27  GLUCOSE 172* 201*  BUN 14 14  CREATININE 0.77 0.80  CALCIUM 10.3 10.0  AST 17  --   ALT 13*  --   ALKPHOS 98  --   BILITOT 0.6  --    Cardiac Enzymes No results for input(s): TROPONINI in the last 168 hours. RADIOLOGY:  Dg Ankle Complete Left  Result Date: 11/14/2017 CLINICAL DATA:  Left leg pain. EXAM: LEFT ANKLE COMPLETE - 3+ VIEW COMPARISON:  None. FINDINGS: No acute fracture or malalignment. The talar dome is intact. The ankle mortise is symmetric. No tibiotalar joint effusion. Severe, diffuse osteopenia. Soft tissues are unremarkable. IMPRESSION: Negative. Electronically Signed   By: Titus Dubin M.D.   On: 11/14/2017 14:39   US Venous Img Lower  Left (dvt  Study)  Result Date: 11/14/2017 CLINICAL DATA:  Left lower extremity pain and edema.  Recent fall. EXAM: LEFT LOWER EXTREMITY VENOUS DOPPLER ULTRASOUND TECHNIQUE: Gray-scale sonography with graded compression, as well as color Doppler and duplex ultrasound were performed to evaluate the lower extremity deep venous systems from the level of the common femoral vein and including the  common femoral, femoral, profunda femoral, popliteal and calf veins including the posterior tibial, peroneal and gastrocnemius veins when visible. The superficial great saphenous vein was also interrogated. Spectral Doppler was utilized to evaluate flow at rest and with distal augmentation maneuvers in the common femoral, femoral and popliteal veins. COMPARISON:  None. FINDINGS: Contralateral Common Femoral Vein: Respiratory phasicity is normal and symmetric with the symptomatic side. No evidence of thrombus. Normal compressibility. Common Femoral Vein: Occlusive thrombus identified. There may be minimal amount of flow in the common femoral vein. Saphenofemoral Junction: Thrombus present that extends into the upper thigh segment of the great saphenous vein. Profunda Femoral Vein: Occlusive thrombus identified. Femoral Vein: Occlusive thrombus identified. Popliteal Vein: Thrombus identified. There does appear to be some flow in the popliteal vein. Calf Veins: Due to body habitus, the tibial veins were not able to be visualized. Superficial Great Saphenous Vein: No evidence of thrombus. Normal compressibility. Venous Reflux:  None. Other Findings:  No abnormal fluid collections. IMPRESSION: Extensive deep venous thrombosis of the left lower extremity involving the common femoral vein, profunda femoral vein, femoral vein and popliteal vein. Thrombus appears essentially occlusive with some potential small amount of flow detected in the common femoral vein and popliteal vein. The calf veins are not visualized well by ultrasound. Thrombus extends into the upper thigh segment of the great saphenous vein. Electronically Signed   By: Aletta Edouard M.D.   On: 11/14/2017 15:27   Dg Knee Complete 4 Views Left  Result Date: 11/14/2017 CLINICAL DATA:  Left leg pain. EXAM: LEFT KNEE - COMPLETE 4+ VIEW COMPARISON:  None. FINDINGS: No acute fracture or malalignment. Mild tricompartmental joint space narrowing with small  marginal osteophytes. No significant joint effusion. Severe osteopenia. Soft tissues are unremarkable. IMPRESSION: 1. No acute osseous abnormality. Mild tricompartmental osteoarthritis. Electronically Signed   By: Titus Dubin M.D.   On: 11/14/2017 14:42   Dg Hip Unilat With Pelvis 2-3 Views Left  Result Date: 11/14/2017 CLINICAL DATA:  Left leg pain. EXAM: DG HIP (WITH OR WITHOUT PELVIS) 2-3V LEFT COMPARISON:  CT pelvis dated June 21, 2017. FINDINGS: There is no evidence of hip fracture or dislocation. Mild bilateral hip joint space narrowing. The pubic symphysis and sacroiliac joints are intact. Degenerative changes of the lower lumbar spine. Osteopenia. IUD in the pelvis. Rectal tube in place. IMPRESSION: No acute osseous abnormality. Electronically Signed   By: Titus Dubin M.D.   On: 11/14/2017 14:41   ASSESSMENT AND PLAN:   Theresa Blake  is a 77 y.o. female with a known history per below, history of PE on Eliquis, chronic immobility/bedbound state for 6-8 months, presenting with acute left leg pain with associated swelling, pain with movement, brought via EMS, in the emergency room found to have extensive left lower extremity DVT   1 acute extensive left lower extremity DVT History of PE in the past, extreme morbid obesity, chronic immobility syndrome/bedbound state -Patient had been off her Eliquis for some time.  She reported having some vaginal bleeding.  We will continue to monitor for any bleeding now that she is on IV heparin drip. -Vascular consultation placed.  There are  plans noted to do thrombolysis in her left lower extremity given extensive DVT   2 acute constipation No bowel movement in approximately 2 weeks Fleets enema x1, lactulose twice daily for now, and strict I&O monitoring  3 chronic extreme morbid obesity Most likely secondary to excess calories Lifestyle modification recommended  4 chronic diabetes mellitus type 2 Sliding scale insulin, ADA/cardiac  diet, Accu-Cheks per routine  5 chronic anxiety disorder, NOS Stable Continue home regimen   Case discussed with Care Management/Social Worker. Management plans discussed with the patient, family and they are in agreement.  CODE STATUS: full  DVT Prophylaxis: heparin  TOTAL TIME TAKING CARE OF THIS PATIENT: *30* minutes.  >50% time spent on counselling and coordination of care  POSSIBLE D/C IN *few* DAYS, DEPENDING ON CLINICAL CONDITION.  Note: This dictation was prepared with Dragon dictation along with smaller phrase technology. Any transcriptional errors that result from this process are unintentional.  Fritzi Mandes M.D on 11/15/2017 at 1:42 PM  Between 7am to 6pm - Pager - [phone]  After 6pm go to [email or web address] - password [email or web address] [facility]  Office  [phone]  CC: Primary care physician; [place], KernodlePatient ID: Theresa Blake, female   DOB: [birth year], 77 y.o.   MRN: [id number]

## 2017-11-15 NOTE — Progress Notes (Signed)
ANTICOAGULATION CONSULT NOTE - Initial Consult  Pharmacy Consult for Heparin Indication: DVT  Allergies  Allergen Reactions  . Anesthetics, Halogenated Shortness Of Breath  . Levaquin [Levofloxacin In D5w] Itching  . Tramadol Other (See Comments)    Reaction: unknown  . Lamotrigine Itching    Patient Measurements: Height: 5\' 7"  (170.2 cm) Weight: (!) 348 lb (157.9 kg) IBW/kg (Calculated) : 61.6 Heparin Dosing Weight: 101.3 kg  Vital Signs: Temp: 98.3 F (36.8 C) (01/22 1028) Temp Source: Oral (01/22 0434) BP: 142/67 (01/22 1028) Pulse Rate: 107 (01/22 1028)  Labs: Recent Labs    11/14/17 1229 11/14/17 1723 11/15/17 0216 11/15/17 0952  HGB 14.2  --  13.0  --   HCT 43.4  --  39.1  --   PLT 310  --  311  --   APTT  --  35 120*  --   LABPROT  --  13.2  --   --   INR  --  1.01  --   --   HEPARINUNFRC  --  <0.10* 0.61 0.31  CREATININE 0.77  --  0.80  --   CKTOTAL 40  --   --   --     Estimated Creatinine Clearance: 96 mL/min (by C-G formula based on SCr of 0.8 mg/dL).   Medical History: Past Medical History:  Diagnosis Date  . Abnormal Pap smear of cervix   . Allergic rhinitis, cause unspecified   . Backache, unspecified   . Depression    mood disorder; ? bipolar  . Dermatomycosis, unspecified   . Diabetes mellitus type II   . Edema   . Foot fracture, right   . HLD (hyperlipidemia)   . HTN (hypertension)   . Hx pulmonary embolism    multiple  . Lumbar spinal stenosis   . Malignant neoplasm of corpus uteri, except isthmus ([place])   . Morbid obesity ([place])   . Other specified disease of hair and hair follicles   . Pain in joint, pelvic region and thigh   . Panic disorder without agoraphobia       Assessment: 77 yo female with hx of PE on Eliquis now starting on heparin drip for extensive DVT. Lovenox ordered in ED not yet given now switching over to heparin per admitting hospitalist. Per pt in person, stated last dose of Eliquis was days ago (confirmed  none in last 24h).   Stat baseline aPTT, INR, HL ordered Hgb 14.2, Plt 310 - WNL  Goal of Therapy:  Heparin level 0.3-0.7 units/ml Monitor platelets by anticoagulation protocol: Yes   Plan:  Heparin bolus 5000 units IV x1 then heparin drip at 1650 units/hr (=16.5 ml/hr) Anticipate should be able to dose off of heparin levels since last dose was days ago. Will order aPTT and HL in 8h after drip started just in case.  CBC in AM  Informed ED RN that labs need to be drawn before hanging drip if possible  Pharmacy will continue to follow.   Ulice Dash D 11/15/2017,12:42 PM   01/22 0200 heparin level 0.61, aPTT 120. Since baseline anti-Xa was undetectable, will continue to monitor and adjust by anti-Xa alone. Continue current regimen and recheck in 8 hours to confirm.  01/22 @ 1245 HL 0.31 With HL at lower end of goal range, will increase infusion to 1750 units/hr and recheck a HL in 8 hours.   Ulice Dash, PharmD Clinical Pharmacist  11/15/17 12:42 PM

## 2017-11-16 DIAGNOSIS — M79605 Pain in left leg: Secondary | ICD-10-CM

## 2017-11-16 DIAGNOSIS — I82402 Acute embolism and thrombosis of unspecified deep veins of left lower extremity: Secondary | ICD-10-CM

## 2017-11-16 LAB — CBC
HEMATOCRIT: 38.2 % (ref 35.0–47.0)
HEMOGLOBIN: 12.4 g/dL (ref 12.0–16.0)
MCH: 30 pg (ref 26.0–34.0)
MCHC: 32.4 g/dL (ref 32.0–36.0)
MCV: 92.4 fL (ref 80.0–100.0)
Platelets: 310 10*3/uL (ref 150–440)
RBC: 4.13 MIL/uL (ref 3.80–5.20)
RDW: 14 % (ref 11.5–14.5)
WBC: 9.8 10*3/uL (ref 3.6–11.0)

## 2017-11-16 LAB — GLUCOSE, CAPILLARY
GLUCOSE-CAPILLARY: 177 mg/dL — AB (ref 65–99)
GLUCOSE-CAPILLARY: 207 mg/dL — AB (ref 65–99)
GLUCOSE-CAPILLARY: 145 mg/dL — AB (ref 65–99)
Glucose-Capillary: 108 mg/dL — ABNORMAL HIGH (ref 65–99)

## 2017-11-16 LAB — HEMOGLOBIN A1C
Hgb A1c MFr Bld: 8.7 % — ABNORMAL HIGH (ref 4.8–5.6)
MEAN PLASMA GLUCOSE: 202.99 mg/dL

## 2017-11-16 LAB — HEPARIN LEVEL (UNFRACTIONATED): HEPARIN UNFRACTIONATED: 0.64 [IU]/mL (ref 0.30–0.70)

## 2017-11-16 MED ORDER — CEFAZOLIN SODIUM-DEXTROSE 1-4 GM/50ML-% IV SOLN: 1.0000 g | INTRAVENOUS | Status: DC

## 2017-11-16 MED ORDER — DEXTROSE 5 % IV SOLN
INTRAVENOUS | Status: DC
  Filled 2017-11-16: qty 10

## 2017-11-16 NOTE — Progress Notes (Signed)
[facility]  Daily Progress Note   Subjective  - * No surgery date entered *  The patient continues to complain of pain in the left leg and she states she can barely move her leg.  Objective Vitals:   11/16/17 0403 11/16/17 0429 11/16/17 1025 11/16/17 1156  BP: (!) 138/58  (!) 115/41 (!) 141/63  Pulse: 97  (!) 107 (!) 103  Resp: 20   20  Temp: 98.7 F (37.1 C)   98.3 F (36.8 C)  TempSrc: Oral     SpO2: 93%   93%  Weight:  (!) 153.5 kg (338 lb 6.4 oz)    Height:        Intake/Output Summary (Last 24 hours) at 11/16/2017 1753 Last data filed at 11/16/2017 1602 Gross per 24 hour  Intake 711 ml  Output 600 ml  Net 111 ml    PULM  Normal effort , no use of accessory muscles CV  No JVD, RRR Abd      No distended, nontender VASC  left leg is massively swollen.  It is warm to the touch with brisk capillary refill  Laboratory CBC    Component Value Date/Time   WBC 9.8 11/16/2017 0316   HGB 12.4 11/16/2017 0316   HCT 38.2 11/16/2017 0316   PLT 310 11/16/2017 0316    BMET    Component Value Date/Time   NA 134 (L) 11/15/2017 0216   K 3.5 11/15/2017 0216   CL 99 (L) 11/15/2017 0216   CO2 27 11/15/2017 0216   GLUCOSE 201 (H) 11/15/2017 0216   BUN 14 11/15/2017 0216   CREATININE 0.80 11/15/2017 0216   CALCIUM 10.0 11/15/2017 0216   GFRNONAA >60 11/15/2017 0216   GFRAA >60 11/15/2017 0216    Assessment/Planning:   Iliofemoral DVT: I have discussed thrombectomy once again with the patient.  She is agreed to proceed and we will move forward tomorrow afternoon.    The risks and benefits were reviewed all questions answered patient has agreed to proceed with iliofemoral DVT thrombectomy and possible intervention.    Hortencia Pilar  11/16/2017, 5:53 PM

## 2017-11-16 NOTE — Progress Notes (Signed)
[facility] at [place] NAME: Theresa Blake    MR#:  [id number]  DATE OF BIRTH:  [birth year]  SUBJECTIVE:  Patient has been bedbound for 6-8 weeks  Son takes care of her at home.  Came in with significant left lower extremity swelling and pain.  Was found to have extensive left lower extremity DVT. c/o constipation. Patient is upset since she is not getting her procedure done today REVIEW OF SYSTEMS:   Review of Systems  Constitutional: Negative for chills, fever and weight loss.  HENT: Negative for ear discharge, ear pain and nosebleeds.   Eyes: Negative for blurred vision, pain and discharge.  Respiratory: Negative for sputum production, shortness of breath, wheezing and stridor.   Cardiovascular: Negative for chest pain, palpitations, orthopnea and PND.  Gastrointestinal: Positive for constipation. Negative for abdominal pain, diarrhea, nausea and vomiting.  Genitourinary: Negative for frequency and urgency.  Musculoskeletal: Negative for back pain and joint pain.  Neurological: Positive for weakness. Negative for sensory change, speech change and focal weakness.  Psychiatric/Behavioral: Negative for depression and hallucinations. The patient is not nervous/anxious.    Tolerating Diet:yes Tolerating PT: Patient is morbidly obese and bedbound for 6-8 weeks  DRUG ALLERGIES:   Allergies  Allergen Reactions  . Anesthetics, Halogenated Shortness Of Breath  . Levaquin [Levofloxacin In D5w] Itching  . Tramadol Other (See Comments)    Reaction: unknown  . Lamotrigine Itching    VITALS:  Blood pressure (!) 141/63, pulse (!) 103, temperature 98.7 F (37.1 C), temperature source Oral, resp. rate 20, height 5\' 7"  (1.702 m), weight (!) 153.5 kg (338 lb 6.4 oz), SpO2 93 %.  PHYSICAL EXAMINATION:   Physical Exam  GENERAL:  77 y.o.-year-old patient lying in the bed with no acute distress. Morbidly obese EYES: Pupils equal, round, reactive  to light and accommodation. No scleral icterus. Extraocular muscles intact.  HEENT: Head atraumatic, normocephalic. Oropharynx and nasopharynx clear.  NECK:  Supple, no jugular venous distention. No thyroid enlargement, no tenderness.  LUNGS: Normal breath sounds bilaterally, no wheezing, rales, rhonchi. No use of accessory muscles of respiration.  CARDIOVASCULAR: S1, S2 normal. No murmurs, rubs, or gallops.  ABDOMEN: Soft, nontender, nondistended. Bowel sounds present. No organomegaly or mass.  EXTREMITIES: severe +++ edema b/l.    NEUROLOGIC: Cranial nerves II through XII are intact. No focal Motor or sensory deficits b/l.   PSYCHIATRIC:  patient is alert and oriented x 3.  SKIN: No obvious rash, lesion, or ulcer.   LABORATORY PANEL:  CBC Recent Labs  Lab 11/16/17 0316  WBC 9.8  HGB 12.4  HCT 38.2  PLT 310    Chemistries  Recent Labs  Lab 11/14/17 1229 11/15/17 0216  NA 135 134*  K 3.8 3.5  CL 98* 99*  CO2 27 27  GLUCOSE 172* 201*  BUN 14 14  CREATININE 0.77 0.80  CALCIUM 10.3 10.0  AST 17  --   ALT 13*  --   ALKPHOS 98  --   BILITOT 0.6  --    Cardiac Enzymes No results for input(s): TROPONINI in the last 168 hours. RADIOLOGY:  Dg Ankle Complete Left  Result Date: 11/14/2017 CLINICAL DATA:  Left leg pain. EXAM: LEFT ANKLE COMPLETE - 3+ VIEW COMPARISON:  None. FINDINGS: No acute fracture or malalignment. The talar dome is intact. The ankle mortise is symmetric. No tibiotalar joint effusion. Severe, diffuse osteopenia. Soft tissues are unremarkable. IMPRESSION: Negative. Electronically Signed   By: Huntley Dec  Derry M.D.   On: 11/14/2017 14:39   US Venous Img Lower  Left (dvt Study)  Result Date: 11/14/2017 CLINICAL DATA:  Left lower extremity pain and edema.  Recent fall. EXAM: LEFT LOWER EXTREMITY VENOUS DOPPLER ULTRASOUND TECHNIQUE: Gray-scale sonography with graded compression, as well as color Doppler and duplex ultrasound were performed to evaluate the lower  extremity deep venous systems from the level of the common femoral vein and including the common femoral, femoral, profunda femoral, popliteal and calf veins including the posterior tibial, peroneal and gastrocnemius veins when visible. The superficial great saphenous vein was also interrogated. Spectral Doppler was utilized to evaluate flow at rest and with distal augmentation maneuvers in the common femoral, femoral and popliteal veins. COMPARISON:  None. FINDINGS: Contralateral Common Femoral Vein: Respiratory phasicity is normal and symmetric with the symptomatic side. No evidence of thrombus. Normal compressibility. Common Femoral Vein: Occlusive thrombus identified. There may be minimal amount of flow in the common femoral vein. Saphenofemoral Junction: Thrombus present that extends into the upper thigh segment of the great saphenous vein. Profunda Femoral Vein: Occlusive thrombus identified. Femoral Vein: Occlusive thrombus identified. Popliteal Vein: Thrombus identified. There does appear to be some flow in the popliteal vein. Calf Veins: Due to body habitus, the tibial veins were not able to be visualized. Superficial Great Saphenous Vein: No evidence of thrombus. Normal compressibility. Venous Reflux:  None. Other Findings:  No abnormal fluid collections. IMPRESSION: Extensive deep venous thrombosis of the left lower extremity involving the common femoral vein, profunda femoral vein, femoral vein and popliteal vein. Thrombus appears essentially occlusive with some potential small amount of flow detected in the common femoral vein and popliteal vein. The calf veins are not visualized well by ultrasound. Thrombus extends into the upper thigh segment of the great saphenous vein. Electronically Signed   By: Aletta Edouard M.D.   On: 11/14/2017 15:27   Dg Knee Complete 4 Views Left  Result Date: 11/14/2017 CLINICAL DATA:  Left leg pain. EXAM: LEFT KNEE - COMPLETE 4+ VIEW COMPARISON:  None. FINDINGS: No  acute fracture or malalignment. Mild tricompartmental joint space narrowing with small marginal osteophytes. No significant joint effusion. Severe osteopenia. Soft tissues are unremarkable. IMPRESSION: 1. No acute osseous abnormality. Mild tricompartmental osteoarthritis. Electronically Signed   By: Titus Dubin M.D.   On: 11/14/2017 14:42   Dg Hip Unilat With Pelvis 2-3 Views Left  Result Date: 11/14/2017 CLINICAL DATA:  Left leg pain. EXAM: DG HIP (WITH OR WITHOUT PELVIS) 2-3V LEFT COMPARISON:  CT pelvis dated June 21, 2017. FINDINGS: There is no evidence of hip fracture or dislocation. Mild bilateral hip joint space narrowing. The pubic symphysis and sacroiliac joints are intact. Degenerative changes of the lower lumbar spine. Osteopenia. IUD in the pelvis. Rectal tube in place. IMPRESSION: No acute osseous abnormality. Electronically Signed   By: Titus Dubin M.D.   On: 11/14/2017 14:41   ASSESSMENT AND PLAN:   Theresa Blake  is a 77 y.o. female with a known history per below, history of PE on Eliquis, chronic immobility/bedbound state for 6-8 months, presenting with acute left leg pain with associated swelling, pain with movement, brought via EMS, in the emergency room found to have extensive left lower extremity DVT   1 acute extensive left lower extremity DVT History of PE in the past, extreme morbid obesity, chronic immobility syndrome/bedbound state -Patient had been off her Eliquis for some time.  She reported having some vaginal bleeding.  We will continue to monitor for  any bleeding now that she is on IV heparin drip. -Vascular consultation placed.  There are plans noted to do thrombolysis in her left lower extremity given extensive DVT   2 acute constipation No bowel movement in approximately 2 weeks Fleets enema x1, lactulose twice daily for now, and strict I&O monitoring  3 chronic extreme morbid obesity Most likely secondary to excess calories Lifestyle modification  recommended  4 chronic diabetes mellitus type 2 Sliding scale insulin, ADA/cardiac diet, Accu-Cheks per routine  5 chronic anxiety disorder, NOS Stable Continue home regimen  D/w son in the room Case discussed with Care Management/Social Worker. Management plans discussed with the patient, family and they are in agreement.  CODE STATUS: full  DVT Prophylaxis: heparin  TOTAL TIME TAKING CARE OF THIS PATIENT: *30* minutes.  >50% time spent on counselling and coordination of care  POSSIBLE D/C IN *few* DAYS, DEPENDING ON CLINICAL CONDITION.  Note: This dictation was prepared with Dragon dictation along with smaller phrase technology. Any transcriptional errors that result from this process are unintentional.  Fritzi Mandes M.D on 11/16/2017 at 1:50 PM  Between 7am to 6pm - Pager - [phone]  After 6pm go to [email or web address] - password [email or web address] [facility]  Office  [phone]  CC: Primary care physician; [place], [place]ernodlePatient ID: [name], female   DOB: 07-17-1941, 77 y.o.   MRN: [id number]

## 2017-11-16 NOTE — Progress Notes (Signed)
Called Dr. Estanislado Pandy regarding patient experiencing vaginal bleeding.  Patient is on heparin drip and will be monitored.  Christene Slates  11/16/2017  6:28 AM

## 2017-11-17 ENCOUNTER — Inpatient Hospital Stay: Payer: Medicare Other

## 2017-11-17 ENCOUNTER — Encounter
Admission: EM | Disposition: A | Discharge status: Home Health | Payer: Self-pay | Source: Home / Self Care | Attending: Internal Medicine

## 2017-11-17 DIAGNOSIS — I82402 Acute embolism and thrombosis of unspecified deep veins of left lower extremity: Secondary | ICD-10-CM

## 2017-11-17 HISTORY — PX: LOWER EXTREMITY VENOGRAPHY: CATH118253

## 2017-11-17 HISTORY — PX: IVC FILTER INSERTION: CATH118245

## 2017-11-17 LAB — GLUCOSE, CAPILLARY
GLUCOSE-CAPILLARY: 177 mg/dL — AB (ref 65–99)
GLUCOSE-CAPILLARY: 163 mg/dL — AB (ref 65–99)
GLUCOSE-CAPILLARY: 162 mg/dL — AB (ref 65–99)
Glucose-Capillary: 161 mg/dL — ABNORMAL HIGH (ref 65–99)
Glucose-Capillary: 133 mg/dL — ABNORMAL HIGH (ref 65–99)

## 2017-11-17 LAB — CBC
HCT: 40.7 % (ref 35.0–47.0)
Hemoglobin: 13.3 g/dL (ref 12.0–16.0)
MCH: 30.4 pg (ref 26.0–34.0)
MCHC: 32.8 g/dL (ref 32.0–36.0)
MCV: 92.9 fL (ref 80.0–100.0)
PLATELETS: 328 10*3/uL (ref 150–440)
RBC: 4.38 MIL/uL (ref 3.80–5.20)
RDW: 14.2 % (ref 11.5–14.5)
WBC: 8.9 10*3/uL (ref 3.6–11.0)

## 2017-11-17 LAB — HEPARIN LEVEL (UNFRACTIONATED): Heparin Unfractionated: 0.7 IU/mL (ref 0.30–0.70)

## 2017-11-17 SURGERY — PERIPHERAL VASCULAR THROMBECTOMY
Anesthesia: Moderate Sedation | Laterality: Left

## 2017-11-17 SURGERY — LOWER EXTREMITY VENOGRAPHY
Anesthesia: Moderate Sedation

## 2017-11-17 MED ORDER — FENTANYL CITRATE (PF) 100 MCG/2ML IJ SOLN
INTRAMUSCULAR | Status: AC
  Filled 2017-11-17: qty 2

## 2017-11-17 MED ORDER — MIDAZOLAM HCL 5 MG/5ML IJ SOLN
INTRAMUSCULAR | Status: AC
  Filled 2017-11-17: qty 5

## 2017-11-17 MED ORDER — IOPAMIDOL (ISOVUE-300) INJECTION 61%
INTRAVENOUS | Status: DC | PRN
  Administered 2017-11-17: 15 mL via INTRA_ARTERIAL

## 2017-11-17 MED ORDER — HEPARIN SODIUM (PORCINE) 1000 UNIT/ML IJ SOLN
INTRAMUSCULAR | Status: AC
  Filled 2017-11-17: qty 1

## 2017-11-17 MED ORDER — IOPAMIDOL (ISOVUE-300) INJECTION 61%: 30.0000 mL | Freq: Once | INTRAVENOUS | Status: DC | PRN

## 2017-11-17 MED ORDER — LIDOCAINE HCL (PF) 1 % IJ SOLN
INTRAMUSCULAR | Status: AC
  Filled 2017-11-17: qty 30

## 2017-11-17 MED ORDER — CEFAZOLIN (ANCEF) 1 G IV SOLR
3.0000 g | INTRAVENOUS | Status: DC
  Filled 2017-11-17: qty 3

## 2017-11-17 MED ORDER — DEXTROSE 5 % IV SOLN
3.0000 g | INTRAVENOUS | Status: AC
  Administered 2017-11-17: 3 g via INTRAVENOUS
  Filled 2017-11-17: qty 3

## 2017-11-17 MED ORDER — MIDAZOLAM HCL 2 MG/2ML IJ SOLN
INTRAMUSCULAR | Status: DC | PRN
  Administered 2017-11-17: 2 mg via INTRAVENOUS
  Administered 2017-11-17 (×3): 1 mg via INTRAVENOUS

## 2017-11-17 MED ORDER — HEPARIN (PORCINE) IN NACL 2-0.9 UNIT/ML-% IJ SOLN
INTRAMUSCULAR | Status: AC
  Filled 2017-11-17: qty 1000

## 2017-11-17 MED ORDER — HEPARIN SODIUM (PORCINE) 1000 UNIT/ML IJ SOLN
INTRAMUSCULAR | Status: DC | PRN
  Administered 2017-11-17: 4000 [IU] via INTRAVENOUS

## 2017-11-17 MED ORDER — FENTANYL CITRATE (PF) 100 MCG/2ML IJ SOLN
INTRAMUSCULAR | Status: DC | PRN
  Administered 2017-11-17: 50 ug via INTRAVENOUS
  Administered 2017-11-17: 25 ug via INTRAVENOUS
  Administered 2017-11-17: 50 ug via INTRAVENOUS
  Administered 2017-11-17: 25 ug via INTRAVENOUS

## 2017-11-17 SURGICAL SUPPLY — 15 items
CATH BEACON 5 .038 100 VERT TP (CATHETERS) ×3 IMPLANT
DEVICE TORQUE (MISCELLANEOUS) ×3 IMPLANT
DRAPE ORTHO 2.5IN SPLIT 77X108 (DRAPES) ×1 IMPLANT
DRAPE ORTHO SPLIT 77X108 STRL (DRAPES) ×2
GLIDECATH 4FR STR (CATHETERS) ×3 IMPLANT
GLIDESHEATH SLEND SS 6F .021 (SHEATH) IMPLANT
GUIDEWIRE SUPER STIFF .035X180 (WIRE) ×3 IMPLANT
INTRODUCER SHEALTH ACCUSTICK (MISCELLANEOUS) IMPLANT
KIT FEMORAL DEL DENALI (Miscellaneous) IMPLANT
NEEDLE ENTRY 21GA 7CM ECHOTIP (NEEDLE) ×3 IMPLANT
PACK ANGIOGRAPHY (CUSTOM PROCEDURE TRAY) ×3 IMPLANT
SET INTRO CAPELLA COAXIAL (SET/KITS/TRAYS/PACK) ×3 IMPLANT
SHEATH BRITE TIP 8FRX11 (SHEATH) ×3 IMPLANT
SHIELD X-DRAPE GOLD 12X17 (MISCELLANEOUS) ×3 IMPLANT
WIRE J 3MM .035X145CM (WIRE) ×3 IMPLANT

## 2017-11-17 NOTE — Progress Notes (Addendum)
ANTICOAGULATION CONSULT NOTE - Initial Consult  Pharmacy Consult for Heparin Indication: DVT  Allergies  Allergen Reactions  . Anesthetics, Halogenated Shortness Of Breath  . Levaquin [Levofloxacin In D5w] Itching  . Tramadol Other (See Comments)    Reaction: unknown  . Lamotrigine Itching    Patient Measurements: Height: 5\' 7"  (170.2 cm) Weight: (!) 336 lb 11.2 oz (152.7 kg) IBW/kg (Calculated) : 61.6 Heparin Dosing Weight: 101.3 kg  Vital Signs: Temp: 97.6 F (36.4 C) (01/24 1645) Temp Source: Oral (01/24 1645) BP: 134/72 (01/24 1645) Pulse Rate: 107 (01/24 1645)  Labs: Recent Labs    11/14/17 1723  11/15/17 0216  11/15/17 2110 11/16/17 0316 11/17/17 0508  HGB  --    < > 13.0  --   --  12.4 13.3  HCT  --   --  39.1  --   --  38.2 40.7  PLT  --   --  311  --   --  310 328  APTT 35  --  120*  --   --   --   --   LABPROT 13.2  --   --   --   --   --   --   INR 1.01  --   --   --   --   --   --   HEPARINUNFRC <0.10*  --  0.61   < > 0.59 0.64 0.70  CREATININE  --   --  0.80  --   --   --   --    < > = values in this interval not displayed.    Estimated Creatinine Clearance: 94 mL/min (by C-G formula based on SCr of 0.8 mg/dL).   Medical History: Past Medical History:  Diagnosis Date  . Abnormal Pap smear of cervix   . Allergic rhinitis, cause unspecified   . Backache, unspecified   . Depression    mood disorder; ? bipolar  . Dermatomycosis, unspecified   . Diabetes mellitus type II   . Edema   . Foot fracture, right   . HLD (hyperlipidemia)   . HTN (hypertension)   . Hx pulmonary embolism    multiple  . Lumbar spinal stenosis   . Malignant neoplasm of corpus uteri, except isthmus ([place])   . Morbid obesity ([place])   . Other specified disease of hair and hair follicles   . Pain in joint, pelvic region and thigh   . Panic disorder without agoraphobia       Assessment: 77 yo female with hx of PE on Eliquis now starting on heparin drip for extensive  DVT. Lovenox ordered in ED not yet given now switching over to heparin per admitting hospitalist. Per pt in person, stated last dose of Eliquis was days ago (confirmed none in last 24h).   Stat baseline aPTT, INR, HL ordered Hgb 14.2, Plt 310 - WNL  Goal of Therapy:  Heparin level 0.3-0.7 units/ml Monitor platelets by anticoagulation protocol: Yes   Plan:  Heparin bolus 5000 units IV x1 then heparin drip at 1650 units/hr (=16.5 ml/hr) Anticipate should be able to dose off of heparin levels since last dose was days ago. Will order aPTT and HL in 8h after drip started just in case.  CBC in AM  Informed ED RN that labs need to be drawn before hanging drip if possible  Pharmacy will continue to follow.   01/22 0200 heparin level 0.61, aPTT 120. Since baseline anti-Xa was undetectable, will continue to monitor  and adjust by anti-Xa alone. Continue current regimen and recheck in 8 hours to confirm.  01/22 @ 1245 HL 0.31 With HL at lower end of goal range, will increase infusion to 1750 units/hr and recheck a HL in 8 hours.   1/22 2100 heparin level 0.59. Continue current regimen. Recheck heparin level and CBC with tomorrow AM labs.  1/23 AM heparin level 0.64. Continue current regimen. Recheck heparin level and CBC with tomorrow AM labs.  1/24 AM heparin level 0.70. Continue current regimen. Recheck heparin level and CBC with tomorrow AM labs. Thrombectomy today?  1/24 16:05 note from RN specials says heparin drip resumed post thrombectomy at previous rate. Spoke with RN on floor - Dr. Delana Meyer wants to resume UFH. Drip restarted at 16:05 at 1750 units/hr. Will check HL in 8 hours and adjust per protocol.  Nathan A. Jordan Hawks, PharmD, BCPS  Clinical Pharmacist 11/17/17 4:53 PM   1/24 2359 heparin level 0.58. Continue current regimen. Recheck heparin level and CBC with AM labs.  1/25 AM heparin level 0.65. Continue current regimen. Recheck heparin level and CBC with tomorrow AM  labs.   Sim Boast, PharmD, BCPS  11/18/17 1:11 AM

## 2017-11-17 NOTE — Progress Notes (Signed)
Dr Posey Pronto requested that I put in an order for PT after the patient returned from her thrombectomy today

## 2017-11-17 NOTE — Progress Notes (Signed)
Pharmacy Perioperative Antibiotic Adjustment  77 y/o F scheduled for thrombectomy today. Patient ordered cefazolin 1 g iv x 1 prior to procedure.   Filed Weights   11/14/17 1221 11/16/17 0429 11/17/17 0308  Weight: (!) 348 lb (157.9 kg) (!) 338 lb 6.4 oz (153.5 kg) (!) 336 lb 11.2 oz (152.7 kg)   Will change cefazolin dose to 3 g iv once for weight > 120 kg.   Ulice Dash, PharmD Clinical Pharmacist

## 2017-11-17 NOTE — Progress Notes (Addendum)
ANTICOAGULATION CONSULT NOTE - Initial Consult  Pharmacy Consult for Heparin Indication: DVT  Allergies  Allergen Reactions  . Anesthetics, Halogenated Shortness Of Breath  . Levaquin [Levofloxacin In D5w] Itching  . Tramadol Other (See Comments)    Reaction: unknown  . Lamotrigine Itching    Patient Measurements: Height: 5\' 7"  (170.2 cm) Weight: (!) 336 lb 11.2 oz (152.7 kg) IBW/kg (Calculated) : 61.6 Heparin Dosing Weight: 101.3 kg  Vital Signs: Temp: 97.6 F (36.4 C) (01/24 0458) Temp Source: Oral (01/24 0458) BP: 98/55 (01/24 0458) Pulse Rate: 102 (01/24 0458)  Labs: Recent Labs    11/14/17 1229  11/14/17 1723 11/15/17 0216  11/15/17 2110 11/16/17 0316 11/17/17 0508  HGB 14.2  --   --  13.0  --   --  12.4 13.3  HCT 43.4  --   --  39.1  --   --  38.2 40.7  PLT 310  --   --  311  --   --  310 328  APTT  --   --  35 120*  --   --   --   --   LABPROT  --   --  13.2  --   --   --   --   --   INR  --   --  1.01  --   --   --   --   --   HEPARINUNFRC  --    < > <0.10* 0.61   < > 0.59 0.64 0.70  CREATININE 0.77  --   --  0.80  --   --   --   --   CKTOTAL 40  --   --   --   --   --   --   --    < > = values in this interval not displayed.    Estimated Creatinine Clearance: 94 mL/min (by C-G formula based on SCr of 0.8 mg/dL).   Medical History: Past Medical History:  Diagnosis Date  . Abnormal Pap smear of cervix   . Allergic rhinitis, cause unspecified   . Backache, unspecified   . Depression    mood disorder; ? bipolar  . Dermatomycosis, unspecified   . Diabetes mellitus type II   . Edema   . Foot fracture, right   . HLD (hyperlipidemia)   . HTN (hypertension)   . Hx pulmonary embolism    multiple  . Lumbar spinal stenosis   . Malignant neoplasm of corpus uteri, except isthmus ([place])   . Morbid obesity ([place])   . Other specified disease of hair and hair follicles   . Pain in joint, pelvic region and thigh   . Panic disorder without agoraphobia        Assessment: 77 yo female with hx of PE on Eliquis now starting on heparin drip for extensive DVT. Lovenox ordered in ED not yet given now switching over to heparin per admitting hospitalist. Per pt in person, stated last dose of Eliquis was days ago (confirmed none in last 24h).   Stat baseline aPTT, INR, HL ordered Hgb 14.2, Plt 310 - WNL  Goal of Therapy:  Heparin level 0.3-0.7 units/ml Monitor platelets by anticoagulation protocol: Yes   Plan:  Heparin bolus 5000 units IV x1 then heparin drip at 1650 units/hr (=16.5 ml/hr) Anticipate should be able to dose off of heparin levels since last dose was days ago. Will order aPTT and HL in 8h after drip started just in case.  CBC in AM  Informed ED RN that labs need to be drawn before hanging drip if possible  Pharmacy will continue to follow.   Kennya Schwenn S 11/17/2017,6:18 AM   01/22 0200 heparin level 0.61, aPTT 120. Since baseline anti-Xa was undetectable, will continue to monitor and adjust by anti-Xa alone. Continue current regimen and recheck in 8 hours to confirm.  01/22 @ 1245 HL 0.31 With HL at lower end of goal range, will increase infusion to 1750 units/hr and recheck a HL in 8 hours.   1/22 2100 heparin level 0.59. Continue current regimen. Recheck heparin level and CBC with tomorrow AM labs.  1/23 AM heparin level 0.64. Continue current regimen. Recheck heparin level and CBC with tomorrow AM labs.  1/24 AM heparin level 0.70. Continue current regimen. Recheck heparin level and CBC with tomorrow AM labs. Thrombectomy today?  Sim Boast, PharmD, BCPS  11/17/17 6:18 AM

## 2017-11-17 NOTE — Progress Notes (Signed)
[facility] arrived from [facility].  Dressing dry

## 2017-11-17 NOTE — Progress Notes (Signed)
Patient sent to [facility] with oxygen.  2 of her sons are with her

## 2017-11-17 NOTE — Progress Notes (Signed)
[facility] NAME: Theresa Blake    MR#:  [id number]  DATE OF BIRTH:  1941-08-10  SUBJECTIVE:  Patient has been bedbound for 6-8 weeks  Son takes care of her at home.  Came in with significant left lower extremity swelling and pain.  Was found to have extensive left lower extremity DVT. Noted to have cherry colored urine in the cannister. Has h/o intermittent vaginal bleeding REVIEW OF SYSTEMS:   Review of Systems  Constitutional: Negative for chills, fever and weight loss.  HENT: Negative for ear discharge, ear pain and nosebleeds.   Eyes: Negative for blurred vision, pain and discharge.  Respiratory: Negative for sputum production, shortness of breath, wheezing and stridor.   Cardiovascular: Negative for chest pain, palpitations, orthopnea and PND.  Gastrointestinal: Positive for constipation. Negative for abdominal pain, diarrhea, nausea and vomiting.  Genitourinary: Negative for frequency and urgency.  Musculoskeletal: Negative for back pain and joint pain.  Neurological: Positive for weakness. Negative for sensory change, speech change and focal weakness.  Psychiatric/Behavioral: Negative for depression and hallucinations. The patient is not nervous/anxious.    Tolerating Diet:yes Tolerating PT: Patient is morbidly obese and bedbound for 6-8 weeks  DRUG ALLERGIES:   Allergies  Allergen Reactions  . Anesthetics, Halogenated Shortness Of Breath  . Levaquin [Levofloxacin In D5w] Itching  . Tramadol Other (See Comments)    Reaction: unknown  . Lamotrigine Itching    VITALS:  Blood pressure (!) 98/55, pulse (!) 102, temperature 97.6 F (36.4 C), temperature source Oral, resp. rate 20, height 5\' 7"  (1.702 m), weight (!) 152.7 kg (336 lb 11.2 oz), SpO2 94 %.  PHYSICAL EXAMINATION:   Physical Exam  GENERAL:  77 y.o.-year-old patient lying in the bed with no acute distress. Morbidly obese EYES: Pupils equal, round,  reactive to light and accommodation. No scleral icterus. Extraocular muscles intact.  HEENT: Head atraumatic, normocephalic. Oropharynx and nasopharynx clear.  NECK:  Supple, no jugular venous distention. No thyroid enlargement, no tenderness.  LUNGS: Normal breath sounds bilaterally, no wheezing, rales, rhonchi. No use of accessory muscles of respiration.  CARDIOVASCULAR: S1, S2 normal. No murmurs, rubs, or gallops.  ABDOMEN: Soft, nontender, nondistended. Bowel sounds present. No organomegaly or mass.  EXTREMITIES: severe +++ edema b/l.    NEUROLOGIC: Cranial nerves II through XII are intact. No focal Motor or sensory deficits b/l.   PSYCHIATRIC:  patient is alert and oriented x 3.  SKIN: No obvious rash, lesion, or ulcer.   LABORATORY PANEL:  CBC Recent Labs  Lab 11/17/17 0508  WBC 8.9  HGB 13.3  HCT 40.7  PLT 328    Chemistries  Recent Labs  Lab 11/14/17 1229 11/15/17 0216  NA 135 134*  K 3.8 3.5  CL 98* 99*  CO2 27 27  GLUCOSE 172* 201*  BUN 14 14  CREATININE 0.77 0.80  CALCIUM 10.3 10.0  AST 17  --   ALT 13*  --   ALKPHOS 98  --   BILITOT 0.6  --    Cardiac Enzymes No results for input(s): TROPONINI in the last 168 hours. RADIOLOGY:  No results found. ASSESSMENT AND PLAN:   Theresa Blake  is a 77 y.o. female with a known history per below, history of PE on Eliquis, chronic immobility/bedbound state for 6-8 months, presenting with acute left leg pain with associated swelling, pain with movement, brought via EMS, in the emergency room found to have extensive left lower extremity DVT  1 acute extensive left lower extremity DVT History of PE in the past, extreme morbid obesity, chronic immobility syndrome/bedbound state -Patient had been off her Eliquis for some time.  She reported having some vaginal bleeding.  We will continue to monitor for any bleeding now that she is on IV heparin drip. -Vascular consultation placed.  There are plans noted to do thrombolysis  in her left lower extremity given extensive DVT   2 acute constipation No bowel movement in approximately 2 weeks Fleets enema x1, lactulose twice daily for now, and strict I&O monitoring  3 chronic extreme morbid obesity Most likely secondary to excess calories Lifestyle modification recommended  4 chronic diabetes mellitus type 2 Sliding scale insulin, ADA/cardiac diet, Accu-Cheks per routine  5 chronic anxiety disorder, NOS Stable Continue home regimen  6.vaginal bleeding due to IV heparin in the setting of Grade 1 Well differentiated endometrial cancer--was followed at [facility] and has had RT in march/april 2016---appears to have lost to f/u thereafter -spoke with Dr Theresa Blake this am to see pt. He will d/w Dr Theresa Blake about it -hgb stable at 13.0 D/w son in the room Case discussed with Care Management/Social Worker. Management plans discussed with the patient, family and they are in agreement.  CODE STATUS: full  DVT Prophylaxis: heparin  TOTAL TIME TAKING CARE OF THIS PATIENT: *30* minutes.  >50% time spent on counselling and coordination of care  POSSIBLE D/C IN *few* DAYS, DEPENDING ON CLINICAL CONDITION.  Note: This dictation was prepared with Dragon dictation along with smaller phrase technology. Any transcriptional errors that result from this process are unintentional.  Theresa Blake M.D on 11/17/2017 at 8:57 AM  Between 7am to 6pm - Pager - [phone]  After 6pm go to [email or web address] - password [email or web address] [facility]  Office  [phone]  CC: Primary care physician; [place], KernodlePatient ID: Theresa Blake, female   DOB: 1941-01-06, 77 y.o.   MRN: [id number]

## 2017-11-17 NOTE — Progress Notes (Signed)
Patient clinically stable post procedure that was diagnostic per Dr Delana Meyer. Son at bedside. Vitals stable. St per monitor. Restarted heparin gtt per orders at previous rate. Denies complaints. Awake, confused from prior procedure. Dr Delana Meyer out to speak with patient and son regarding procedure with questions answered.report called to Rochel Brome RN with plan reviewed.,

## 2017-11-17 NOTE — Op Note (Signed)
                                                                  VEIN AND VASCULAR SURGERY                                                                               OPERATIVE NOTE    PRE-OPERATIVE DIAGNOSIS: Symptomatic left leg DVT  POST-OPERATIVE DIAGNOSIS: Same  PROCEDURE: 1. Ultrasound guidance for vascular access to the left common femoral vein  2. Catheter placement into the inferior vena cava  3. Inferior venacavogram with left leg venography  SURGEON: Hortencia Pilar  ASSISTANT(S): None  ANESTHESIA: Conscious sedation was administered by the interventional radiology RN under my direct supervision. IV Versed plus fentanyl were utilized. Continuous ECG, pulse oximetry and blood pressure was monitored throughout the entire procedure.  Conscious sedation was administered for a total of 48 minutes.  ESTIMATED BLOOD LOSS: minimal  Contrast: 15 cc  Fluoroscopy time: 2.7  FINDING(S): 1. 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 year old female who presents with massive swelling of the left leg quite painful in association  Risks and benefits including filter thrombosis, migration, fracture, bleeding, and infection were all discussed. We discussed that all IVC filters that we place can be removed if desired from the patient once the need for the filter has passed.   DESCRIPTION: After obtaining full informed written consent, the patient was brought back to the vascular suite. The skin was sterilely prepped and draped in a sterile surgical field was created.  Ultrasound was placed in a sterile sleeve.  The left common femoral vein was image with the ultrasound.  It was echolucent and compressible indicating patency.  Image was recorded for the permanent record. The left common femoral vein was  accessed under direct ultrasound guidance without difficulty with a micropuncture needle and a microwire was advanced without difficulty.   A microsheath was then inserted and then a J-wire was then placed. The dilator is passed over the wire and the delivery sheath was placed into the inferior vena cava. Inferior venacavogram was performed. This demonstrated a extensive thrombus up to the level of the renal veins within the IVC. Given this finding I do not believe placing a suprarenal filter is warranted given the extensive nature of the clot thrombolysis is not likely to be successful.  Procedure was terminated.  Interpretation: Initial images demonstrate extensive thrombus within the IVC extending all the way up to the renal veins.  Initial images the left lower extremity then demonstrated extensive thrombus throughout the common femoral and iliac veins  COMPLICATIONS: None  CONDITION: Stable  Hortencia Pilar  11/17/2017,7:35 PM

## 2017-11-18 ENCOUNTER — Encounter: Payer: Self-pay | Admitting: Vascular Surgery

## 2017-11-18 ENCOUNTER — Inpatient Hospital Stay: Payer: Medicare Other

## 2017-11-18 LAB — CBC
HCT: 37.4 % (ref 35.0–47.0)
Hemoglobin: 12.5 g/dL (ref 12.0–16.0)
MCH: 30.8 pg (ref 26.0–34.0)
MCHC: 33.5 g/dL (ref 32.0–36.0)
MCV: 91.9 fL (ref 80.0–100.0)
PLATELETS: 315 10*3/uL (ref 150–440)
RBC: 4.06 MIL/uL (ref 3.80–5.20)
RDW: 13.9 % (ref 11.5–14.5)
WBC: 9 10*3/uL (ref 3.6–11.0)

## 2017-11-18 LAB — GLUCOSE, CAPILLARY
GLUCOSE-CAPILLARY: 119 mg/dL — AB (ref 65–99)
GLUCOSE-CAPILLARY: 169 mg/dL — AB (ref 65–99)
Glucose-Capillary: 144 mg/dL — ABNORMAL HIGH (ref 65–99)
Glucose-Capillary: 160 mg/dL — ABNORMAL HIGH (ref 65–99)

## 2017-11-18 LAB — HEPARIN LEVEL (UNFRACTIONATED)
HEPARIN UNFRACTIONATED: 0.58 [IU]/mL (ref 0.30–0.70)
HEPARIN UNFRACTIONATED: 0.65 [IU]/mL (ref 0.30–0.70)

## 2017-11-18 MED ORDER — IOPAMIDOL (ISOVUE-370) INJECTION 76%
100.0000 mL | Freq: Once | INTRAVENOUS | Status: AC | PRN
Start: 2017-11-18 — End: 2017-11-18
  Administered 2017-11-18: 100 mL via INTRAVENOUS

## 2017-11-18 MED ORDER — MEGESTROL ACETATE 20 MG PO TABS
80.0000 mg | ORAL_TABLET | Freq: Two times a day (BID) | ORAL | Status: DC
  Administered 2017-11-18 – 2017-11-19 (×2): 80 mg via ORAL
  Filled 2017-11-18: qty 2
  Filled 2017-11-18 (×2): qty 4

## 2017-11-18 NOTE — Care Management Important Message (Signed)
Important Message  Patient Details  Name: Theresa Blake MRN: [id number] Date of Birth: 10-28-1940   Medicare Important Message Given:  Yes    Beverly Sessions, RN 11/18/2017, 4:41 PM

## 2017-11-18 NOTE — Evaluation (Signed)
Physical Therapy Evaluation Patient Details Name: Theresa Blake MRN: [id number] DOB: December 14, 1940 Today's Date: 11/18/2017   History of Present Illness  presented to ER secondary to L LE pain; admitted with extensive L LE DVT.  Status post thrombectomy 1/24, significant for extensive thrombus in IVC to renal veins, L common and external iliac veins, L common femoral vein.  Clinical Impression  Upon evaluation, patient alert and oriented; follows commands and demonstrates fair insight into deficits/need for assist.  L LE grossly edematous with marked deficits in strength/ROM (due to recent DVT).  Currently requiring max assist +1-2 for rolling and max/dep assist +2 for supine/sit.  Once oriented to midline and positioned in neutral, able to maintain unsupported static sitting balance with close sup for periods of time (min assist otherwise).  Fatigues quickly, but pleased with performance and eager to continue work with therapy as able. Would benefit from skilled PT to address above deficits and promote optimal return to PLOF; recommend transition to STR upon discharge from acute hospitalization.     Follow Up Recommendations SNF    Equipment Recommendations       Recommendations for Other Services       Precautions / Restrictions Precautions Precautions: Fall Restrictions Weight Bearing Restrictions: No      Mobility  Bed Mobility Overal bed mobility: Needs Assistance Bed Mobility: Rolling;Sit to Supine;Supine to Sit Rolling: Max assist;+2 for safety/equipment   Supine to sit: Max assist;+2 for physical assistance Sit to supine: +2 for physical assistance   General bed mobility comments: extensive assist for L LE management, truncal elevation  Transfers                 General transfer comment: unsafe/unable  Ambulation/Gait             General Gait Details: unsafe/unable  Stairs            Wheelchair Mobility    Modified Rankin (Stroke Patients  Only)       Balance Overall balance assessment: Needs assistance Sitting-balance support: No upper extremity supported;Feet supported Sitting balance-Leahy Scale: Fair Sitting balance - Comments: able to maintain with close sup once accommodated to position       Standing balance comment: unsafe/unable                             Pertinent Vitals/Pain Pain Assessment: Faces Faces Pain Scale: Hurts little more Pain Location: L LE Pain Descriptors / Indicators: Aching;Grimacing Pain Intervention(s): Limited activity within patient's tolerance;Monitored during session;Repositioned    Home Living Family/patient expects to be discharged to:: Private residence Living Arrangements: Children Available Help at Discharge: Family;Available 24 hours/day Type of Home: House Home Access: Ramped entrance     Home Layout: Two level;Able to live on main level with bedroom/bathroom Home Equipment: Gilford Rile - 2 wheels;Bedside commode;Wheelchair - manual      Prior Function Level of Independence: Needs assistance         Comments: Per patient/son, patient has been non-ambulatory for approx 1 year; was able to perform simple transfers to/from seating surfaces, but has recently become totally bedbound.  Son assists with ADLs as needed     Hand Dominance        Extremity/Trunk Assessment   Upper Extremity Assessment Upper Extremity Assessment: Overall WFL for tasks assessed    Lower Extremity Assessment Lower Extremity Assessment: Generalized weakness(L LE grossly edematous with strength 2+ to 3-/5;  R LE grossly 4-/5)  Communication   Communication: No difficulties  Cognition Arousal/Alertness: Awake/alert Behavior During Therapy: WFL for tasks assessed/performed Overall Cognitive Status: Within Functional Limits for tasks assessed                                 General Comments: mild confusion, memory deficts at times      General Comments       Exercises Other Exercises Other Exercises: Rolling bilat for peri-care, clothing and linen change after incontinent bowel/bladder; max assist +1-2 for rolling, dep assist for hygiene.   Assessment/Plan    PT Assessment Patient needs continued PT services  PT Problem List Decreased strength;Decreased range of motion;Decreased activity tolerance;Decreased balance;Decreased mobility;Decreased coordination;Decreased cognition;Decreased safety awareness;Decreased knowledge of use of DME;Decreased knowledge of precautions;Cardiopulmonary status limiting activity;Pain;Obesity       PT Treatment Interventions DME instruction;Gait training;Functional mobility training;Therapeutic activities;Therapeutic exercise;Balance training;Patient/family education    PT Goals (Current goals can be found in the Care Plan section)  Acute Rehab PT Goals Patient Stated Goal: to get stronger PT Goal Formulation: With patient/family Time For Goal Achievement: 12/02/17 Potential to Achieve Goals: Fair Additional Goals Additional Goal #1: Assess and establish goals for gait efforts as appropriate.    Frequency Min 2X/week   Barriers to discharge        Co-evaluation               AM-PAC PT "6 Clicks" Daily Activity  Outcome Measure Difficulty turning over in bed (including adjusting bedclothes, sheets and blankets)?: Unable Difficulty moving from lying on back to sitting on the side of the bed? : Unable Difficulty sitting down on and standing up from a chair with arms (e.g., wheelchair, bedside commode, etc,.)?: Unable Help needed moving to and from a bed to chair (including a wheelchair)?: Total Help needed walking in hospital room?: Total Help needed climbing 3-5 steps with a railing? : Total 6 Click Score: 6    End of Session Equipment Utilized During Treatment: Oxygen Activity Tolerance: Patient tolerated treatment well Patient left: in bed;with call bell/phone within reach;with bed  alarm set;with nursing/sitter in room;with family/visitor present Nurse Communication: Mobility status PT Visit Diagnosis: Muscle weakness (generalized) (M62.81);Difficulty in walking, not elsewhere classified (R26.2);Pain Pain - Right/Left: Left Pain - part of body: Leg    Time: 1519-1550 PT Time Calculation (min) (ACUTE ONLY): 31 min   Charges:   PT Evaluation $PT Eval Moderate Complexity: 1 Mod PT Treatments $Therapeutic Activity: 8-22 mins   PT G Codes:        Jaryd Drew H. Owens Shark, PT, DPT, NCS 11/18/17, 5:20 PM [phone]

## 2017-11-18 NOTE — Care Management (Signed)
Son agreeable to home health services, and would like to use [facility].  Jason with [facility] heads up referral

## 2017-11-18 NOTE — Care Management (Signed)
Patient suffers from chronic leg pain which is caused by extensive thrombus within the IVC extending up to the renal veins Hospital bed will alleviate pain by allowing legs to be positioned in ways not feasible with a normal bed. Pain episodes frequent require changes in body position which cannot be achieved with a normal bed.

## 2017-11-18 NOTE — Consult Note (Addendum)
Consult History and Physical   SERVICE: Gynecology   Patient Name: Theresa Blake Patient MRN:   [id number]  CC: vaginal bleeding   HPI: Theresa Blake is a morbidly obese, bed-bound, non-compliant  77 y.o. G4 with history of Grade 1 EC, s/p EBRT and SBRT due to inability to operate due to medical comorbidities, now with Mirena IUD in place since 06/2014. Treated initially by [facility]: Dillon Bjork, last contact 8/17 Was seen by me inpatient as a consult in 05/2017 for same issue, was given both PO progesterone and appointment at our [facility], and she was non-compliant with both.    She bleeds daily, vaginally, and has since prior to my first encounter with her.   She is on Elliquis and presented to the hospital with an extensive venous thrombus, through the IVC to the level of the renal veins, with attempted thrombectomy or IVC filter placement; terminated due to extensive nature and low likelihood of success.   Review of Systems: positives in bold GEN:   fevers, chills, weight changes, appetite changes, fatigue, night sweats  HEENT:  HA, vision changes, hearing loss, congestion, rhinorrhea, sinus pressure, dysphagia CV:   CP, palpitations PULM:  SOB, cough GI:  abd pain, N/V/D/C GU:  dysuria, urgency, frequency - incontinent  MSK:  arthralgias, myalgias, back pain, swelling SKIN:  rashes, color changes, pallor NEURO:  numbness, weakness, tingling, seizures, dizziness, tremors PSYCH:  depression, anxiety, behavioral problems, confusion  HEME/LYMPH:  easy bruising or bleeding ENDO:  heat/cold intolerance   Past Gynecologic History: as above  Past Medical History: Past Medical History:  Diagnosis Date  . Abnormal Pap smear of cervix   . Allergic rhinitis, cause unspecified   . Backache, unspecified   . Depression    mood disorder; ? bipolar  . Dermatomycosis, unspecified   . Diabetes mellitus type II   . Edema   . Foot fracture, right   .  HLD (hyperlipidemia)   . HTN (hypertension)   . Hx pulmonary embolism    multiple  . Lumbar spinal stenosis   . Malignant neoplasm of corpus uteri, except isthmus ([place])   . Morbid obesity ([place])   . Other specified disease of hair and hair follicles   . Pain in joint, pelvic region and thigh   . Panic disorder without agoraphobia     Past Surgical History:   Past Surgical History:  Procedure Laterality Date  . BREAST BIOPSY     Right-benign  . ENDOMETRIAL BIOPSY  08/2006   attempted  . IVC FILTER INSERTION N/A 11/17/2017   Procedure: IVC FILTER INSERTION;  Surgeon: Katha Cabal, MD;  Location: [place] [facility];  Service: Cardiovascular;  Laterality: N/A;  . LOWER EXTREMITY VENOGRAPHY N/A 11/17/2017   Procedure: LOWER EXTREMITY VENOGRAPHY;  Surgeon: Katha Cabal, MD;  Location: [place] [facility];  Service: Cardiovascular;  Laterality: N/A;  . TONSILLECTOMY    . TUBAL LIGATION      Family History:  family history includes Gout in her son; Heart attack (age of onset: 16) in her father; Hyperlipidemia in her son and son; Pancreatic cancer in her mother.  Social History:  Social History   Socioeconomic History  . Marital status: Widowed    Spouse name: Not on file  . Number of children: 4  . Years of education: Not on file  . Highest education level: Not on file  Social Needs  . Financial resource strain: Not on file  .  Food insecurity - worry: Not on file  . Food insecurity - inability: Not on file  . Transportation needs - medical: Not on file  . Transportation needs - non-medical: Not on file  Occupational History  . Occupation: Disabled  Tobacco Use  . Smoking status: Never Smoker  . Smokeless tobacco: Never Used  Substance and Sexual Activity  . Alcohol use: No  . Drug use: No  . Sexual activity: Not on file  Other Topics Concern  . Not on file  Social History Narrative   Widow-husband died with MI, DM      4 sons      Disability       Compulsive overeater             Home Medications:  Medications reconciled in EPIC  No current facility-administered medications on file prior to encounter.    Current Outpatient Medications on File Prior to Encounter  Medication Sig Dispense Refill  . apixaban (ELIQUIS) 2.5 MG TABS tablet Take 2.5 mg by mouth 2 (two) times daily.    Marland Kitchen b complex vitamins tablet Take 1 tablet by mouth daily.    Marland Kitchen buPROPion (WELLBUTRIN XL) 150 MG 24 hr tablet Take 450 mg by mouth daily.     . clonazePAM (KLONOPIN) 1 MG tablet Take 1 mg by mouth 3 (three) times daily as needed for anxiety. For anxiety    . ezetimibe-simvastatin (VYTORIN) 10-20 MG tablet Take 1 tablet by mouth daily.    . fluticasone (FLONASE) 50 MCG/ACT nasal spray Place 2 sprays into both nostrils daily.    . furosemide (LASIX) 20 MG tablet Take 20 mg by mouth daily.    Marland Kitchen levothyroxine (SYNTHROID, LEVOTHROID) 50 MCG tablet Take 1 tablet (50 mcg total) by mouth daily before breakfast. 30 tablet 0  . lisinopril (PRINIVIL,ZESTRIL) 20 MG tablet TAKE 1 TABLET BY MOUTH EVERY DAY 90 tablet 1  . Multiple Vitamin (MULTIVITAMIN) tablet Take 1 tablet by mouth daily.      Marland Kitchen omeprazole (PRILOSEC) 20 MG capsule TAKE ONE CAPSULE BY MOUTH EVERY DAY *NEEDS OFFICE VISIT* 30 capsule 0  . venlafaxine (EFFEXOR-XR) 150 MG 24 hr capsule Take 300 mg by mouth daily.      Marland Kitchen acetaminophen (TYLENOL) 500 MG tablet Take 1,000 mg by mouth every 6 (six) hours as needed for mild pain, moderate pain, fever or headache.    . Blood Glucose Monitoring Suppl (ACCU-CHEK AVIVA PLUS) W/DEVICE KIT by Does not apply route.    . diphenhydrAMINE (BENADRYL) 25 MG tablet Take 25 mg by mouth every 6 (six) hours as needed for allergies.    Marland Kitchen glucose blood (ACCU-CHEK AVIVA PLUS) test strip 1 each by Other route as needed for other. Use as instructed      Allergies:  Allergies  Allergen Reactions  . Anesthetics, Halogenated Shortness Of Breath  . Levaquin [Levofloxacin In D5w] Itching   . Tramadol Other (See Comments)    Reaction: unknown  . Lamotrigine Itching    Physical Exam:  Temp:  [97.6 F (36.4 C)-98.8 F (37.1 C)] 98.8 F (37.1 C) (01/25 1211) Pulse Rate:  [100-112] 100 (01/25 1211) Resp:  [15-20] 18 (01/25 1211) BP: (102-151)/(55-93) 114/93 (01/25 1404) SpO2:  [92 %-100 %] 93 % (01/25 1211)   General Appearance:  [place], obese, with nasal cannula O2 delivery.  No distress. HEENT:  Normocephalic atraumatic, extraocular movements intact, moist mucous membranes, neck supple with midline trachea and thyroid without masses Cardiovascular:  Normal S1/S2, regular rate and  rhythm, no murmurs, 2+ distal pulses Pulmonary:  clear to auscultation normal effort. Abdomen:  Bowel sounds present, soft, nontender, nondistended, no abnormal masses or organomegaly, no epigastric pain Back: inspection of back is normal Skin:  No rashes Neurologic:  Cranial nerves 2-12 grossly intact, resting tremor bilateral upper extremities. Psychiatric:  Normal mood and affect, appropriate, no AH/VH, poor concentration and tangential thoughts. Pelvic:  Deferred per patient request.    Labs/Studies:   CBC and Coags:  Lab Results  Component Value Date   WBC 9.0 11/18/2017   NEUTOPHILPCT 82 11/14/2017   EOSPCT 2 11/14/2017   BASOPCT 0 11/14/2017   LYMPHOPCT 8 11/14/2017   HGB 12.5 11/18/2017   HCT 37.4 11/18/2017   MCV 91.9 11/18/2017   PLT 315 11/18/2017   INR 1.01 11/14/2017   CMP:  Lab Results  Component Value Date   NA 134 (L) 11/15/2017   K 3.5 11/15/2017   CL 99 (L) 11/15/2017   CO2 27 11/15/2017   BUN 14 11/15/2017   CREATININE 0.80 11/15/2017   CREATININE 0.77 11/14/2017   CREATININE 1.18 (H) 06/21/2017   PROT 7.5 11/14/2017   BILITOT 0.6 11/14/2017   BILIDIR 0.1 03/06/2013   ALT 13 (L) 11/14/2017   AST 17 11/14/2017   ALKPHOS 98 11/14/2017    TVUS:   Other Imaging: Dg Ankle Complete Left  Result Date: 11/14/2017 CLINICAL DATA:  Left leg pain. EXAM:  LEFT ANKLE COMPLETE - 3+ VIEW COMPARISON:  None. FINDINGS: No acute fracture or malalignment. The talar dome is intact. The ankle mortise is symmetric. No tibiotalar joint effusion. Severe, diffuse osteopenia. Soft tissues are unremarkable. IMPRESSION: Negative. Electronically Signed   By: Titus Dubin M.D.   On: 11/14/2017 14:39   [place] Intraoperative  Result Date: 11/17/2017 CLINICAL DATA:  Ultrasound was provided for use by the ordering physician, and a technical charge was applied by the performing facility.  No radiologist interpretation/professional services rendered.   US Venous Img Lower  Left (dvt Study)  Result Date: 11/14/2017 CLINICAL DATA:  Left lower extremity pain and edema.  Recent fall. EXAM: LEFT LOWER EXTREMITY VENOUS DOPPLER ULTRASOUND TECHNIQUE: Gray-scale sonography with graded compression, as well as color Doppler and duplex ultrasound were performed to evaluate the lower extremity deep venous systems from the level of the common femoral vein and including the common femoral, femoral, profunda femoral, popliteal and calf veins including the posterior tibial, peroneal and gastrocnemius veins when visible. The superficial great saphenous vein was also interrogated. Spectral Doppler was utilized to evaluate flow at rest and with distal augmentation maneuvers in the common femoral, femoral and popliteal veins. COMPARISON:  None. FINDINGS: Contralateral Common Femoral Vein: Respiratory phasicity is normal and symmetric with the symptomatic side. No evidence of thrombus. Normal compressibility. Common Femoral Vein: Occlusive thrombus identified. There may be minimal amount of flow in the common femoral vein. Saphenofemoral Junction: Thrombus present that extends into the upper thigh segment of the great saphenous vein. Profunda Femoral Vein: Occlusive thrombus identified. Femoral Vein: Occlusive thrombus identified. Popliteal Vein: Thrombus identified. There does appear to be some flow in  the popliteal vein. Calf Veins: Due to body habitus, the tibial veins were not able to be visualized. Superficial Great Saphenous Vein: No evidence of thrombus. Normal compressibility. Venous Reflux:  None. Other Findings:  No abnormal fluid collections. IMPRESSION: Extensive deep venous thrombosis of the left lower extremity involving the common femoral vein, profunda femoral vein, femoral vein and popliteal vein. Thrombus appears essentially occlusive with some  potential small amount of flow detected in the common femoral vein and popliteal vein. The calf veins are not visualized well by ultrasound. Thrombus extends into the upper thigh segment of the great saphenous vein. Electronically Signed   By: Aletta Edouard M.D.   On: 11/14/2017 15:27   Dg Knee Complete 4 Views Left  Result Date: 11/14/2017 CLINICAL DATA:  Left leg pain. EXAM: LEFT KNEE - COMPLETE 4+ VIEW COMPARISON:  None. FINDINGS: No acute fracture or malalignment. Mild tricompartmental joint space narrowing with small marginal osteophytes. No significant joint effusion. Severe osteopenia. Soft tissues are unremarkable. IMPRESSION: 1. No acute osseous abnormality. Mild tricompartmental osteoarthritis. Electronically Signed   By: Titus Dubin M.D.   On: 11/14/2017 14:42   Dg Hip Unilat With Pelvis 2-3 Views Left  Result Date: 11/14/2017 CLINICAL DATA:  Left leg pain. EXAM: DG HIP (WITH OR WITHOUT PELVIS) 2-3V LEFT COMPARISON:  CT pelvis dated June 21, 2017. FINDINGS: There is no evidence of hip fracture or dislocation. Mild bilateral hip joint space narrowing. The pubic symphysis and sacroiliac joints are intact. Degenerative changes of the lower lumbar spine. Osteopenia. IUD in the pelvis. Rectal tube in place. IMPRESSION: No acute osseous abnormality. Electronically Signed   By: Titus Dubin M.D.   On: 11/14/2017 14:41   CT scan not reported yet but compared to 8/18 there is a stable ovarian cyst on the right.  The IUD is in place,  but the thickness of the endometrial cavity has increased at least twofold.  This is my assessment after reviewing the images.  Report from Radiology to follow.  Assessment / Plan:   Theresa Blake is a 77 y.o. with long history of endometrial cancer treated with radiation and hormonal therapy due to co-morbidities.  1. Unfortunately this patient is wholly non-compliant with her care, and refuses to return to [facility] for evaluation by her doctors.  I had made her an appointment at the [facility] last visit but she did not attend.  2. She has no recollection of being prescribed a progesterone medication from me, and certainly has not taken it since her previous evaluation. I would at the very least restart this, with the hopes of slowing her cancer progression and growth. 3.  I do not know what the prospects are for Ms. Ruest's survival.  Her co-morbidites will likely outpace the growth of her cancer, however the cancer is likely contributing to her thrombotic disease.  Recurrence or progression in the face of an IUD in place is not unexpected, however bodes poorly for further successful hormonal treatment.  It is still worth a try, as it is cheap, accessible, able to be prescribed and hopefully something the patient would agree to, since she is refusing most intervention/evaluation. 4. Ideally, she would meet with the GYN Oncologists to determine if there is any other treatment that would be possible, or amenable to her.  Pessimistically, I do not think that is something she would do, but I will make the referral to our cancer center. 5. Her uterine/vaginal bleeding will continue without treatment. Treating her thrombotic disease far outweighs this, and I would not delay or stop treatment because of her bleeding or disease progression.  At some point, the patient's responsibility to her health must be weighed, and if she continues to refuse to address her cancer/bleeding, then we are limited to what  successes we may have.    I am happy to continue to participate in Ms. Helmkamp's care, however I  am concerned that I may not be able to offer much more in regards to her medical management.  Please discharge her on Megace 29m BID, as I have ordered while she is inpatient.  Thank you for the opportunity to be involved with this patient's care.  ----- CLarey Days MD Attending Obstetrician and Gynecologist [facility] Department [facility] 75 minutes were spent researching this patient's history, personally reviewing images, lab results, admission notes, historical notes, and spending time with patient.  >50% of this time was spent face to face with patient.

## 2017-11-18 NOTE — Care Management (Signed)
Patient admitted from home with extensive DVT.  Patient lives at home with son.  Message was left for [name] to discuss discharge disposition. PT consult is pending.  Anticipate home discharge with home health. Patient has chronic home O2 with [facility], and as also had home health services with them as well.  Patient has WC, RW, O2, and nebulizer in home.  Reported that patient will need hospital bed, and bariatric BSC at discharge. Corene Cornea notified of the need for equipment and potential home health. Orders have been placed for equipment. Note to be entered for bed.  RNCM to follow up with son to determine if he is in agreement and preference of agency.

## 2017-11-19 LAB — CBC
HEMATOCRIT: 38.3 % (ref 35.0–47.0)
HEMOGLOBIN: 12.7 g/dL (ref 12.0–16.0)
MCH: 30.5 pg (ref 26.0–34.0)
MCHC: 33 g/dL (ref 32.0–36.0)
MCV: 92.4 fL (ref 80.0–100.0)
Platelets: 331 10*3/uL (ref 150–440)
RBC: 4.15 MIL/uL (ref 3.80–5.20)
RDW: 14.2 % (ref 11.5–14.5)
WBC: 9.1 10*3/uL (ref 3.6–11.0)

## 2017-11-19 LAB — GLUCOSE, CAPILLARY
GLUCOSE-CAPILLARY: 215 mg/dL — AB (ref 65–99)
Glucose-Capillary: 152 mg/dL — ABNORMAL HIGH (ref 65–99)

## 2017-11-19 LAB — HEPARIN LEVEL (UNFRACTIONATED): Heparin Unfractionated: 0.45 IU/mL (ref 0.30–0.70)

## 2017-11-19 MED ORDER — APIXABAN 5 MG PO TABS: 5.0000 mg | ORAL_TABLET | Freq: Two times a day (BID) | ORAL | Status: DC

## 2017-11-19 MED ORDER — MEGESTROL ACETATE 40 MG PO TABS: 80.0000 mg | ORAL_TABLET | Freq: Two times a day (BID) | ORAL | 0 refills | Status: DC

## 2017-11-19 MED ORDER — APIXABAN 5 MG PO TABS
10.0000 mg | ORAL_TABLET | Freq: Two times a day (BID) | ORAL | Status: DC
  Administered 2017-11-19: 10 mg via ORAL
  Filled 2017-11-19: qty 2

## 2017-11-19 MED ORDER — APIXABAN 5 MG PO TABS: 10.0000 mg | ORAL_TABLET | Freq: Two times a day (BID) | ORAL | 1 refills | Status: DC

## 2017-11-19 NOTE — Progress Notes (Addendum)
[place] [facility] [place] NAME: Theresa Blake    MR#:  [id number]  DATE OF BIRTH:  May 24, 1941  SUBJECTIVE:  Patient has been bedbound for 6-8 weeks  Son takes care of her at home.  Came in with significant left lower extremity swelling and pain.  Was found to have extensive left lower extremity DVT. Noted to have cherry colored urine in the cannister. Has h/o intermittent vaginal bleeding REVIEW OF SYSTEMS:   Review of Systems  Constitutional: Negative for chills, fever and weight loss.  HENT: Negative for ear discharge, ear pain and nosebleeds.   Eyes: Negative for blurred vision, pain and discharge.  Respiratory: Negative for sputum production, shortness of breath, wheezing and stridor.   Cardiovascular: Negative for chest pain, palpitations, orthopnea and PND.  Gastrointestinal: Positive for constipation. Negative for abdominal pain, diarrhea, nausea and vomiting.  Genitourinary: Negative for frequency and urgency.  Musculoskeletal: Negative for back pain and joint pain.  Neurological: Positive for weakness. Negative for sensory change, speech change and focal weakness.  Psychiatric/Behavioral: Negative for depression and hallucinations. The patient is not nervous/anxious.    Tolerating Diet:yes Tolerating PT: Patient is morbidly obese and bedbound for 6-8 weeks  DRUG ALLERGIES:   Allergies  Allergen Reactions  . Anesthetics, Halogenated Shortness Of Breath  . Levaquin [Levofloxacin In D5w] Itching  . Tramadol Other (See Comments)    Reaction: unknown  . Lamotrigine Itching    VITALS:  Blood pressure (!) 154/73, pulse (!) 108, temperature 98.8 F (37.1 C), temperature source Oral, resp. rate (!) 22, height 5\' 7"  (1.702 m), weight (!) 151.5 kg (334 lb), SpO2 96 %.  PHYSICAL EXAMINATION:   Physical Exam  GENERAL:  77 y.o.-year-old patient lying in the bed with no acute distress. Morbidly obese EYES: Pupils equal, round,  reactive to light and accommodation. No scleral icterus. Extraocular muscles intact.  HEENT: Head atraumatic, normocephalic. Oropharynx and nasopharynx clear.  NECK:  Supple, no jugular venous distention. No thyroid enlargement, no tenderness.  LUNGS: Normal breath sounds bilaterally, no wheezing, rales, rhonchi. No use of accessory muscles of respiration.  CARDIOVASCULAR: S1, S2 normal. No murmurs, rubs, or gallops.  ABDOMEN: Soft, nontender, nondistended. Bowel sounds present. No organomegaly or mass.  EXTREMITIES: severe +++ edema b/l.    NEUROLOGIC: Cranial nerves II through XII are intact. No focal Motor or sensory deficits b/l.   PSYCHIATRIC:  patient is alert and oriented x 3.  SKIN: No obvious rash, lesion, or ulcer.   LABORATORY PANEL:  CBC Recent Labs  Lab 11/19/17 0540  WBC 9.1  HGB 12.7  HCT 38.3  PLT 331    Chemistries  Recent Labs  Lab 11/14/17 1229 11/15/17 0216  NA 135 134*  K 3.8 3.5  CL 98* 99*  CO2 27 27  GLUCOSE 172* 201*  BUN 14 14  CREATININE 0.77 0.80  CALCIUM 10.3 10.0  AST 17  --   ALT 13*  --   ALKPHOS 98  --   BILITOT 0.6  --    Cardiac Enzymes No results for input(s): TROPONINI in the last 168 hours. RADIOLOGY:  [place] Intraoperative  Result Date: 11/17/2017 CLINICAL DATA:  Ultrasound was provided for use by the ordering physician, and a technical charge was applied by the performing facility.  No radiologist interpretation/professional services rendered.   Ct Abdomen Pelvis W Contrast  Addendum Date: 11/18/2017   ADDENDUM REPORT: 11/18/2017 15:26 ADDENDUM: I just found a consult note in EPIC from  today documenting that the patient has known endometrial cancer. The clinical team is also aware that the lower extremity thrombus extends centrally into the IVC and there was recent attempt at [facility] filter placement which would account for the gas in the left external iliac vein. Electronically Signed   By: Misty Stanley M.D.   On: 11/18/2017 15:26    Result Date: 11/18/2017 CLINICAL DATA:  No bowel movement in 2 weeks. EXAM: CT ABDOMEN AND PELVIS WITH CONTRAST TECHNIQUE: Multidetector CT imaging of the abdomen and pelvis was performed using the standard protocol following bolus administration of intravenous contrast. CONTRAST:  170mL ISOVUE-370 IOPAMIDOL (ISOVUE-370) INJECTION 76% COMPARISON:  CT pelvis 06/21/2017. FINDINGS: Lower chest: Unremarkable. Hepatobiliary: Tiny well-defined hypoattenuating lesions in the dome of liver and caudate lobe are likely cysts There is no evidence for gallstones, gallbladder wall thickening, or pericholecystic fluid. No intrahepatic or extrahepatic biliary dilation. Pancreas: No focal mass lesion. No dilatation of the main duct. No intraparenchymal cyst. No peripancreatic edema. Spleen: No splenomegaly. No focal mass lesion. Adrenals/Urinary Tract: No adrenal nodule or mass. Tiny probable cyst interpolar left kidney right kidney unremarkable No evidence for hydroureter. The urinary bladder appears normal for the degree of distention. Stomach/Bowel: Stomach is nondistended. No gastric wall thickening. No evidence of outlet obstruction. Duodenum is normally positioned as is the ligament of Treitz. No small bowel wall thickening. No small bowel dilatation. The terminal ileum is normal. The appendix is not visualized, but there is no edema or inflammation in the region of the cecum. No gross colonic mass. No colonic wall thickening. No substantial diverticular change. Vascular/Lymphatic: There is abdominal aortic atherosclerosis without aneurysm. Nonocclusive thrombus is identified in the IVC up to the level of the renal veins. Thrombus is identified in the left common iliac vein and left external iliac vein. Gas is identified in the left external iliac vein (image 75 series 2) There is no gastrohepatic or hepatoduodenal ligament lymphadenopathy. No intraperitoneal or retroperitoneal lymphadenopathy. No pelvic sidewall  lymphadenopathy. Reproductive: IUD is visualized in the uterus. Abnormal endometrial thickening or fluid is present in the uterine fundus 2.6 cm right adnexal cyst stable since 06/21/2017. Other: No intraperitoneal free fluid. Musculoskeletal: Bone windows reveal no worrisome lytic or sclerotic osseous lesions. Subcutaneous edema is identified in the lower left abdominal wall and pelvis. IMPRESSION: 1. No findings to explain the patient's history of no bowel movement. No substantial stool volume in the colon at this time. 2. Nonocclusive thrombus identified in the left external iliac vein, common left iliac vein, and IVC. Recent ultrasound exam of the lower extremity from 11/14/2017 documented deep venous thrombosis. There gas in the left external iliac vein, presumably from venous access. 3. Abnormal fluid/endometrial thickening in the uterus with an IUD in place. In a postmenopausal female this is highly concerning for neoplasm. Pelvic ultrasound suggested to further evaluate. 4. Edema in the subcutaneous tissues of the lower left abdominal wall and pelvis. Cellulitis cannot be excluded by CT imaging. 5.  Aortic Atherosclerois (ICD10-170.0) Electronically Signed: By: Misty Stanley M.D. On: 11/18/2017 15:13   ASSESSMENT AND PLAN:   Theresa Blake  is a 77 y.o. female with a known history per below, history of PE on Eliquis, chronic immobility/bedbound state for 6-8 months, presenting with acute left leg pain with associated swelling, pain with movement, brought via EMS, in the emergency room found to have extensive left lower extremity DVT   1 acute extensive left lower extremity DVT History of PE in the past, extreme  morbid obesity, chronic immobility syndrome/bedbound state -Patient had been off her Eliquis for some time.  She reported having some vaginal bleeding.  -IV heparin gtt -Vascular consultation appreciated.   -Dr [name] unable to do Thrombectomy due to the EXTENSIVE nature of the clot  2  acute constipation No bowel movement in approximately 2 weeks Fleets enema x1, lactulose twice daily for now, and strict I&O monitoring  3 chronic extreme morbid obesity Most likely secondary to excess calories Lifestyle modification recommended  4 chronic diabetes mellitus type 2 Sliding scale insulin, ADA/cardiac diet, Accu-Cheks per routine  5 chronic anxiety disorder, NOS Stable  Continue home regimen  6.vaginal bleeding due to IV heparin in the setting of Grade 1 Well differentiated endometrial cancer--was followed at [facility] and has had RT in march/april 2016---appears to have lost to f/u thereafter -Dr Latanya Presser input appreciated CT abd/pelvis ordered -hgb stable at 13.0 D/w son in the room Case discussed with Care Management/Social Worker. Management plans discussed with the patient, family and they are in agreement.  CODE STATUS: full  DVT Prophylaxis: heparin  TOTAL TIME TAKING CARE OF THIS PATIENT: *30* minutes.  >50% time spent on counselling and coordination of care  POSSIBLE D/C IN *few* DAYS, DEPENDING ON CLINICAL CONDITION.  Note: This dictation was prepared with Dragon dictation along with smaller phrase technology. Any transcriptional errors that result from this process are unintentional.  Fritzi Mandes M.D at 10:11 AM  Between 7am to 6pm - Pager - [phone]  After 6pm go to [email or web address] - Proofreader  [facility]  Office  [phone]  CC: Primary care physician; Tracie Harrier, MDPatient ID: Theresa Blake, female   DOB: 06/30/1941, 77 y.o.   MRN: [id number]

## 2017-11-19 NOTE — Discharge Summary (Signed)
[place] at [place] NAME: Theresa Blake    MR#:  [id number]  DATE OF BIRTH:  1941-09-12  DATE OF ADMISSION:  11/14/2017 ADMITTING PHYSICIAN: Avel Peace Salary, MD  DATE OF DISCHARGE: 11/19/2017  PRIMARY CARE PHYSICIAN: Tracie Harrier, MD    ADMISSION DIAGNOSIS:  Acute deep vein thrombosis (DVT) of femoral vein of left lower extremity (HCC) [I82.412] Osteoarthritis of left knee, unspecified osteoarthritis type [M17.12]  DISCHARGE DIAGNOSIS:  Acute Extensive DVT-- s/p attempted for  Thrombectomy but unable to do it Vaginal bleeding due to Uterine cancer  SECONDARY DIAGNOSIS:   Past Medical History:  Diagnosis Date  . Abnormal Pap smear of cervix   . Allergic rhinitis, cause unspecified   . Backache, unspecified   . Depression    mood disorder; ? bipolar  . Dermatomycosis, unspecified   . Diabetes mellitus type II   . Edema   . Foot fracture, right   . HLD (hyperlipidemia)   . HTN (hypertension)   . Hx pulmonary embolism    multiple  . Lumbar spinal stenosis   . Malignant neoplasm of corpus uteri, except isthmus ([place])   . Morbid obesity ([place])   . Other specified disease of hair and hair follicles   . Pain in joint, pelvic region and thigh   . Panic disorder without agoraphobia     HOSPITAL COURSE:  Theresa Blake a76 y.o.femalewith a known historyper below, history of PE on Eliquis, chronic immobility/bedbound state for 6-8 months, presenting with acute left leg pain with associated swelling, pain with movement, brought via EMS, in the emergency room found to have extensive left lower extremity DVT   1acute extensive left lower extremity DVT History of PE in the past, extreme morbid obesity, chronic immobility syndrome/bedbound state -Patient had been off her Eliquis for some time.  She reported having some vaginal bleeding.  -IV heparin gtt -Vascular consultation appreciated.   -Dr [name] unable to do  Thrombectomy due to the EXTENSIVE nature of the clot  2acute constipation No bowel movement in approximately 2 weeks Fleets enema x1, lactulose twice daily for now  3chronic extreme morbid obesity Most likely secondary to excess calories Lifestyle modification recommended  4chronic diabetes mellitus type 2 Sliding scale insulin, ADA/cardiac diet, Accu-Cheks per routine  5chronic anxiety disorder  Continue home regimen  6.vaginal bleeding due to IV heparin in the setting of Grade 1 Well differentiated endometrial cancer--was [facility] at [facility] and has had RT in march/april 2016---appears to have lost to f/u thereafter -Dr Guido Sander input appreciated--started on megace Pt will cont  po eliquis for now. She outweighs the risk with DVT then her uterine cancer CT abd/pelvis noted -hgb stable at 12.7  Pt will d/c home with [facility] bed and [facility] has been orderd    CONSULTS OBTAINED:  Treatment Team:  Schnier, Dolores Lory, MD Ward, Honor Loh, MD  DRUG ALLERGIES:   Allergies  Allergen Reactions  . Anesthetics, Halogenated Shortness Of Breath  . Levaquin [Levofloxacin In D5w] Itching  . Tramadol Other (See Comments)    Reaction: unknown  . Lamotrigine Itching    DISCHARGE MEDICATIONS:   Allergies as of 11/19/2017      Reactions   Anesthetics, Halogenated Shortness Of Breath   Levaquin [levofloxacin In D5w] Itching   Tramadol Other (See Comments)   Reaction: unknown   Lamotrigine Itching      Medication List    TAKE these medications   ACCU-CHEK AVIVA PLUS test strip Generic  drug:  glucose blood 1 each by Other route as needed for other. Use as instructed   ACCU-CHEK AVIVA PLUS w/Device Kit by Does not apply route.   acetaminophen 500 MG tablet Commonly known as:  TYLENOL Take 1,000 mg by mouth every 6 (six) hours as needed for mild pain, moderate pain, fever or headache.   apixaban 5 MG Tabs tablet Commonly known as:  ELIQUIS Take 2 tablets (10 mg  total) by mouth 2 (two) times daily. Start taking 5 mg bid from 11/26/17 What changed:    medication strength  how much to take  additional instructions   b complex vitamins tablet Take 1 tablet by mouth daily.   buPROPion 150 MG 24 hr tablet Commonly known as:  WELLBUTRIN XL Take 450 mg by mouth daily.   clonazePAM 1 MG tablet Commonly known as:  KLONOPIN Take 1 mg by mouth 3 (three) times daily as needed for anxiety. For anxiety   diphenhydrAMINE 25 MG tablet Commonly known as:  BENADRYL Take 25 mg by mouth every 6 (six) hours as needed for allergies.   ezetimibe-simvastatin 10-20 MG tablet Commonly known as:  VYTORIN Take 1 tablet by mouth daily.   fluticasone 50 MCG/ACT nasal spray Commonly known as:  FLONASE Place 2 sprays into both nostrils daily.   furosemide 20 MG tablet Commonly known as:  LASIX Take 20 mg by mouth daily.   levothyroxine 50 MCG tablet Commonly known as:  SYNTHROID, LEVOTHROID Take 1 tablet (50 mcg total) by mouth daily before breakfast.   lisinopril 20 MG tablet Commonly known as:  PRINIVIL,ZESTRIL TAKE 1 TABLET BY MOUTH EVERY DAY   megestrol 40 MG tablet Commonly known as:  MEGACE Take 2 tablets (80 mg total) by mouth 2 (two) times daily.   multivitamin tablet Take 1 tablet by mouth daily.   omeprazole 20 MG capsule Commonly known as:  PRILOSEC TAKE ONE CAPSULE BY MOUTH EVERY DAY *NEEDS OFFICE VISIT*   venlafaxine XR 150 MG 24 hr capsule Commonly known as:  EFFEXOR-XR Take 300 mg by mouth daily.            Durable Medical Equipment  (From admission, onward)        Start     Ordered   11/18/17 1730  For home use only DME Hospital bed  Once    Question Answer Comment  Patient has (list medical condition): severe dvt   The above medical condition requires: Patient requires the ability to reposition frequently   Bed type Semi-electric      11/18/17 1730   11/18/17 1640  For home use only DME Bedside commode  Once     Comments:  Bariatric  Question:  Patient needs a bedside commode to treat with the following condition  Answer:  Weakness   11/18/17 1640   11/18/17 1639  For home use only DME Hospital bed  Once    Question Answer Comment  Patient has (list medical condition): extensive thrombus within the IVC extending up to the renal veins   The above medical condition requires: Patient requires the ability to reposition frequently   Bed type Semi-electric      11/18/17 1640      If you experience worsening of your admission symptoms, develop shortness of breath, life threatening emergency, suicidal or homicidal thoughts you must seek medical attention immediately by calling 911 or calling your MD immediately  if symptoms less severe.  You Must read complete instructions/literature along with all the possible  adverse reactions/side effects for all the Medicines you take and that have been prescribed to you. Take any new Medicines after you have completely understood and accept all the possible adverse reactions/side effects.   Please note  You were cared for by a hospitalist during your hospital stay. If you have any questions about your discharge medications or the care you received while you were in the hospital after you are discharged, you can call the unit and asked to speak with the hospitalist on call if the hospitalist that took care of you is not available. Once you are discharged, your primary care physician will handle any further medical issues. Please note that NO REFILLS for any discharge medications will be authorized once you are discharged, as it is imperative that you return to your primary care physician (or establish a relationship with a primary care physician if you do not have one) for your aftercare needs so that they can reassess your need for medications and monitor your lab values. Today   SUBJECTIVE   Very anxious  VITAL SIGNS:  Blood pressure (!) 154/73, pulse (!) 108,  temperature 98.8 F (37.1 C), temperature source Oral, resp. rate (!) 22, height '5\' 7"'  (1.702 m), weight (!) 151.5 kg (334 lb), SpO2 96 %.  I/O:    Intake/Output Summary (Last 24 hours) at 11/19/2017 1017 Last data filed at 11/19/2017 0538 Gross per 24 hour  Intake 600.2 ml  Output 1890 ml  Net -1289.8 ml    PHYSICAL EXAMINATION:  GENERAL:  77 y.o.-year-old patient lying in the bed with no acute distress. Morbidly obese EYES: Pupils equal, round, reactive to light and accommodation. No scleral icterus. Extraocular muscles intact.  HEENT: Head atraumatic, normocephalic. Oropharynx and nasopharynx clear.  NECK:  Supple, no jugular venous distention. No thyroid enlargement, no tenderness.  LUNGS: Normal breath sounds bilaterally, no wheezing, rales,rhonchi or crepitation. No use of accessory muscles of respiration.  CARDIOVASCULAR: S1, S2 normal. No murmurs, rubs, or gallops.  ABDOMEN: Soft, non-tender, non-distended. Bowel sounds present. No organomegaly or mass.  EXTREMITIES: severe leg edema--left >right chronic NEUROLOGIC: Cranial nerves II through XII are intact. Muscle strength 5/5 in all extremities. Sensation intact. Gait not checked.  PSYCHIATRIC: The patient is alert and oriented x 3.  SKIN: No obvious rash, lesion, or ulcer.   DATA REVIEW:   CBC  Recent Labs  Lab 11/19/17 0540  WBC 9.1  HGB 12.7  HCT 38.3  PLT 331    Chemistries  Recent Labs  Lab 11/14/17 1229 11/15/17 0216  NA 135 134*  K 3.8 3.5  CL 98* 99*  CO2 27 27  GLUCOSE 172* 201*  BUN 14 14  CREATININE 0.77 0.80  CALCIUM 10.3 10.0  AST 17  --   ALT 13*  --   ALKPHOS 98  --   BILITOT 0.6  --     Microbiology Results   Recent Results (from the past 240 hour(s))  MRSA PCR Screening     Status: Abnormal   Collection Time: 11/14/17  6:59 PM  Result Value Ref Range Status   MRSA by PCR POSITIVE (A) NEGATIVE Final    Comment:        The GeneXpert MRSA Assay (FDA approved for NASAL  specimens only), is one component of a comprehensive MRSA colonization surveillance program. It is not intended to diagnose MRSA infection nor to guide or monitor treatment for MRSA infections. RESULT CALLED TO, READ BACK BY AND VERIFIED WITH: Jacqulynn Cadet RN AT  2100 11/14/16. MSS Performed at [facility], [address] [place], [place] [address]     RADIOLOGY:  [place] Intraoperative  Result Date: 11/17/2017 CLINICAL DATA:  Ultrasound was provided for use by the ordering physician, and a technical charge was applied by the performing facility.  No radiologist interpretation/professional services rendered.   Ct Abdomen Pelvis W Contrast  Addendum Date: 11/18/2017   ADDENDUM REPORT: 11/18/2017 15:26 ADDENDUM: I just found a consult note in EPIC from today documenting that the patient has known endometrial cancer. The clinical team is also aware that the lower extremity thrombus extends centrally into the IVC and there was recent attempt at [facility] filter placement which would account for the gas in the left external iliac vein. Electronically Signed   By: Misty Stanley M.D.   On: 11/18/2017 15:26   Result Date: 11/18/2017 CLINICAL DATA:  No bowel movement in 2 weeks. EXAM: CT ABDOMEN AND PELVIS WITH CONTRAST TECHNIQUE: Multidetector CT imaging of the abdomen and pelvis was performed using the standard protocol following bolus administration of intravenous contrast. CONTRAST:  162m ISOVUE-370 IOPAMIDOL (ISOVUE-370) INJECTION 76% COMPARISON:  CT pelvis 06/21/2017. FINDINGS: Lower chest: Unremarkable. Hepatobiliary: Tiny well-defined hypoattenuating lesions in the dome of liver and caudate lobe are likely cysts There is no evidence for gallstones, gallbladder wall thickening, or pericholecystic fluid. No intrahepatic or extrahepatic biliary dilation. Pancreas: No focal mass lesion. No dilatation of the main duct. No intraparenchymal cyst. No peripancreatic edema. Spleen: No splenomegaly. No  focal mass lesion. Adrenals/Urinary Tract: No adrenal nodule or mass. Tiny probable cyst interpolar left kidney right kidney unremarkable No evidence for hydroureter. The urinary bladder appears normal for the degree of distention. Stomach/Bowel: Stomach is nondistended. No gastric wall thickening. No evidence of outlet obstruction. Duodenum is normally positioned as is the ligament of Treitz. No small bowel wall thickening. No small bowel dilatation. The terminal ileum is normal. The appendix is not visualized, but there is no edema or inflammation in the region of the cecum. No gross colonic mass. No colonic wall thickening. No substantial diverticular change. Vascular/Lymphatic: There is abdominal aortic atherosclerosis without aneurysm. Nonocclusive thrombus is identified in the IVC up to the level of the renal veins. Thrombus is identified in the left common iliac vein and left external iliac vein. Gas is identified in the left external iliac vein (image 75 series 2) There is no gastrohepatic or hepatoduodenal ligament lymphadenopathy. No intraperitoneal or retroperitoneal lymphadenopathy. No pelvic sidewall lymphadenopathy. Reproductive: IUD is visualized in the uterus. Abnormal endometrial thickening or fluid is present in the uterine fundus 2.6 cm right adnexal cyst stable since 06/21/2017. Other: No intraperitoneal free fluid. Musculoskeletal: Bone windows reveal no worrisome lytic or sclerotic osseous lesions. Subcutaneous edema is identified in the lower left abdominal wall and pelvis. IMPRESSION: 1. No findings to explain the patient's history of no bowel movement. No substantial stool volume in the colon at this time. 2. Nonocclusive thrombus identified in the left external iliac vein, common left iliac vein, and IVC. Recent ultrasound exam of the lower extremity from 11/14/2017 documented deep venous thrombosis. There gas in the left external iliac vein, presumably from venous access. 3. Abnormal  fluid/endometrial thickening in the uterus with an IUD in place. In a postmenopausal female this is highly concerning for neoplasm. Pelvic ultrasound suggested to further evaluate. 4. Edema in the subcutaneous tissues of the lower left abdominal wall and pelvis. Cellulitis cannot be excluded by CT imaging. 5.  Aortic Atherosclerois (ICD10-170.0) Electronically Signed: By:  Misty Stanley M.D. On: 11/18/2017 15:13     Management plans discussed with the patient, family and they are in agreement.  CODE STATUS:     Code Status Orders  (From admission, onward)        Start     Ordered   11/14/17 1821  Full code  Continuous     11/14/17 1820    Code Status History    Date Active Date Inactive Code Status Order ID Comments User Context   06/19/2017 23:27 06/22/2017 18:20 Full Code [id number]  Lance Coon, MD Inpatient   07/01/2016 00:22 07/04/2016 01:44 Full Code [id number]  Reubin Milan, MD Inpatient   04/08/2012 14:19 04/09/2012 02:08 Full Code [id number]  Donzetta Matters., MD ED      TOTAL TIME TAKING CARE OF THIS PATIENT: 40 minutes.    Fritzi Mandes M.D on 11/19/2017 at 10:17 AM  Between 7am to 6pm - Pager - [phone] After 6pm go to [email or web address] - password [facility]  Office  [phone]  CC: Primary care physician; Tracie Harrier, MD

## 2017-11-19 NOTE — Care Management Note (Signed)
Case Management Note  Patient Details  Name: Theresa Blake MRN: [id number] Date of Birth: 1941-06-12  Subjective/Objective:   Referral sent to Melene Muller at [facility] per HH=PT, RN. Also requested that a Bariatric BSC and a hospital bed be delivered to the home today. [name] resides with her son Vernella Niznik [phone]. Mrs Bernales is being discharged home today.                 Action/Plan:   Expected Discharge Date:  11/19/17               Expected Discharge Plan:  [place]  In-House Referral:     Discharge planning Services  CM Consult  Post Acute Care Choice:  Home Health Choice offered to:  Patient, Adult Children  DME Arranged:  Hospital bed, [place] wide DME Agency:  [place] Arranged:  RN, PT [facility] Agency:  [place]  Status of Service:  Completed, signed off  If discussed at [place] of Stay Meetings, dates discussed:    Additional Comments:  Shauni Henner A, RN 11/19/2017, 10:29 AM

## 2017-11-19 NOTE — Progress Notes (Signed)
ANTICOAGULATION CONSULT NOTE - Initial Consult  Pharmacy Consult for Heparin Indication: DVT  Allergies  Allergen Reactions  . Anesthetics, Halogenated Shortness Of Breath  . Levaquin [Levofloxacin In D5w] Itching  . Tramadol Other (See Comments)    Reaction: unknown  . Lamotrigine Itching    Patient Measurements: Height: 5\' 7"  (170.2 cm) Weight: (!) 334 lb (151.5 kg) IBW/kg (Calculated) : 61.6 Heparin Dosing Weight: 101.3 kg  Vital Signs: Temp: 98.8 F (37.1 C) (01/26 0511) Temp Source: Oral (01/26 0511) BP: 154/73 (01/26 0511) Pulse Rate: 108 (01/26 0511)  Labs: Recent Labs    11/17/17 0508 11/17/17 2359 11/18/17 0441 11/19/17 0540  HGB 13.3  --  12.5 12.7  HCT 40.7  --  37.4 38.3  PLT 328  --  315 331  HEPARINUNFRC 0.70 0.58 0.65 0.45    Estimated Creatinine Clearance: 93.6 mL/min (by C-G formula based on SCr of 0.8 mg/dL).   Medical History: Past Medical History:  Diagnosis Date  . Abnormal Pap smear of cervix   . Allergic rhinitis, cause unspecified   . Backache, unspecified   . Depression    mood disorder; ? bipolar  . Dermatomycosis, unspecified   . Diabetes mellitus type II   . Edema   . Foot fracture, right   . HLD (hyperlipidemia)   . HTN (hypertension)   . Hx pulmonary embolism    multiple  . Lumbar spinal stenosis   . Malignant neoplasm of corpus uteri, except isthmus ([place])   . Morbid obesity ([place])   . Other specified disease of hair and hair follicles   . Pain in joint, pelvic region and thigh   . Panic disorder without agoraphobia       Assessment: 77 yo female with hx of PE on Eliquis now starting on heparin drip for extensive DVT. Lovenox ordered in ED not yet given now switching over to heparin per admitting hospitalist. Per pt in person, stated last dose of Eliquis was days ago (confirmed none in last 24h).   Stat baseline aPTT, INR, HL ordered Hgb 14.2, Plt 310 - WNL  Goal of Therapy:  Heparin level 0.3-0.7  units/ml Monitor platelets by anticoagulation protocol: Yes   Plan:  Heparin bolus 5000 units IV x1 then heparin drip at 1650 units/hr (=16.5 ml/hr) Anticipate should be able to dose off of heparin levels since last dose was days ago. Will order aPTT and HL in 8h after drip started just in case.  CBC in AM  Informed ED RN that labs need to be drawn before hanging drip if possible  Pharmacy will continue to follow.   01/22 0200 heparin level 0.61, aPTT 120. Since baseline anti-Xa was undetectable, will continue to monitor and adjust by anti-Xa alone. Continue current regimen and recheck in 8 hours to confirm.  01/22 @ 1245 HL 0.31 With HL at lower end of goal range, will increase infusion to 1750 units/hr and recheck a HL in 8 hours.   1/22 2100 heparin level 0.59. Continue current regimen. Recheck heparin level and CBC with tomorrow AM labs.  1/23 AM heparin level 0.64. Continue current regimen. Recheck heparin level and CBC with tomorrow AM labs.  1/24 AM heparin level 0.70. Continue current regimen. Recheck heparin level and CBC with tomorrow AM labs. Thrombectomy today?  1/24 16:05 note from RN specials says heparin drip resumed post thrombectomy at previous rate. Spoke with RN on floor - Dr. Delana Meyer wants to resume UFH. Drip restarted at 16:05 at 1750 units/hr. Will  check HL in 8 hours and adjust per protocol.  Nathan A. Jordan Hawks, PharmD, BCPS  Clinical Pharmacist 11/19/17 6:58 AM   1/24 2359 heparin level 0.58. Continue current regimen. Recheck heparin level and CBC with AM labs.  1/25 AM heparin level 0.65. Continue current regimen. Recheck heparin level and CBC with tomorrow AM labs.  1/26 AM heparin level 0.45. Continue current regimen. Recheck heparin level and CBC with tomorrow AM labs.  Sim Boast, PharmD, BCPS  11/19/17 6:58 AM

## 2017-11-19 NOTE — Progress Notes (Signed)
ANTICOAGULATION CONSULT NOTE - Initial Consult  Pharmacy Consult for Apixaban  Indication: DVT  Allergies  Allergen Reactions  . Anesthetics, Halogenated Shortness Of Breath  . Levaquin [Levofloxacin In D5w] Itching  . Tramadol Other (See Comments)    Reaction: unknown  . Lamotrigine Itching    Patient Measurements: Height: 5\' 7"  (170.2 cm) Weight: (!) 334 lb (151.5 kg) IBW/kg (Calculated) : 61.6   Vital Signs: Temp: 98.8 F (37.1 C) (01/26 0511) Temp Source: Oral (01/26 0511) BP: 154/73 (01/26 0511) Pulse Rate: 108 (01/26 0511)  Labs: Recent Labs    11/17/17 0508 11/17/17 2359 11/18/17 0441 11/19/17 0540  HGB 13.3  --  12.5 12.7  HCT 40.7  --  37.4 38.3  PLT 328  --  315 331  HEPARINUNFRC 0.70 0.58 0.65 0.45    Estimated Creatinine Clearance: 93.6 mL/min (by C-G formula based on SCr of 0.8 mg/dL).   Medical History: Past Medical History:  Diagnosis Date  . Abnormal Pap smear of cervix   . Allergic rhinitis, cause unspecified   . Backache, unspecified   . Depression    mood disorder; ? bipolar  . Dermatomycosis, unspecified   . Diabetes mellitus type II   . Edema   . Foot fracture, right   . HLD (hyperlipidemia)   . HTN (hypertension)   . Hx pulmonary embolism    multiple  . Lumbar spinal stenosis   . Malignant neoplasm of corpus uteri, except isthmus ([place])   . Morbid obesity ([place])   . Other specified disease of hair and hair follicles   . Pain in joint, pelvic region and thigh   . Panic disorder without agoraphobia     Assessment: 77 yo female on heparin drip switching to apixaban  Goal of Therapy:  Monitor platelets by anticoagulation protocol: Yes   Plan:  D/c heparin drip Apixaban 10 mg PO BID x7 days then 5 mg PO BID Called RN to turn off heparin drip when apixaban is given SCr and CBC at least every three days  Rayna Sexton L 11/19/2017,7:50 AM

## 2017-11-26 ENCOUNTER — Emergency Department (HOSPITAL_COMMUNITY): Payer: Medicare Other

## 2017-11-26 ENCOUNTER — Inpatient Hospital Stay (HOSPITAL_COMMUNITY)
Admission: EM | Admit: 2017-11-26 | Discharge: 2017-11-30 | DRG: 175 | Disposition: A | Discharge status: Skilled Nursing Facility | Payer: Medicare Other | Attending: Nephrology | Admitting: Nephrology

## 2017-11-26 ENCOUNTER — Other Ambulatory Visit: Payer: Self-pay

## 2017-11-26 DIAGNOSIS — I2699 Other pulmonary embolism without acute cor pulmonale: Secondary | ICD-10-CM | POA: Diagnosis not present

## 2017-11-26 DIAGNOSIS — R402413 Glasgow coma scale score 13-15, at hospital admission: Secondary | ICD-10-CM | POA: Diagnosis present

## 2017-11-26 DIAGNOSIS — M48061 Spinal stenosis, lumbar region without neurogenic claudication: Secondary | ICD-10-CM | POA: Diagnosis present

## 2017-11-26 DIAGNOSIS — N939 Abnormal uterine and vaginal bleeding, unspecified: Secondary | ICD-10-CM

## 2017-11-26 DIAGNOSIS — Z79899 Other long term (current) drug therapy: Secondary | ICD-10-CM

## 2017-11-26 DIAGNOSIS — F410 Panic disorder [episodic paroxysmal anxiety] without agoraphobia: Secondary | ICD-10-CM | POA: Diagnosis present

## 2017-11-26 DIAGNOSIS — Z881 Allergy status to other antibiotic agents status: Secondary | ICD-10-CM

## 2017-11-26 DIAGNOSIS — I82402 Acute embolism and thrombosis of unspecified deep veins of left lower extremity: Secondary | ICD-10-CM | POA: Diagnosis present

## 2017-11-26 DIAGNOSIS — Z95828 Presence of other vascular implants and grafts: Secondary | ICD-10-CM

## 2017-11-26 DIAGNOSIS — Z86718 Personal history of other venous thrombosis and embolism: Secondary | ICD-10-CM

## 2017-11-26 DIAGNOSIS — Z888 Allergy status to other drugs, medicaments and biological substances status: Secondary | ICD-10-CM

## 2017-11-26 DIAGNOSIS — E039 Hypothyroidism, unspecified: Secondary | ICD-10-CM | POA: Diagnosis not present

## 2017-11-26 DIAGNOSIS — Z884 Allergy status to anesthetic agent status: Secondary | ICD-10-CM

## 2017-11-26 DIAGNOSIS — F411 Generalized anxiety disorder: Secondary | ICD-10-CM | POA: Diagnosis present

## 2017-11-26 DIAGNOSIS — Z7901 Long term (current) use of anticoagulants: Secondary | ICD-10-CM

## 2017-11-26 DIAGNOSIS — E785 Hyperlipidemia, unspecified: Secondary | ICD-10-CM | POA: Diagnosis present

## 2017-11-26 DIAGNOSIS — Z885 Allergy status to narcotic agent status: Secondary | ICD-10-CM

## 2017-11-26 DIAGNOSIS — K219 Gastro-esophageal reflux disease without esophagitis: Secondary | ICD-10-CM | POA: Diagnosis present

## 2017-11-26 DIAGNOSIS — F329 Major depressive disorder, single episode, unspecified: Secondary | ICD-10-CM | POA: Diagnosis present

## 2017-11-26 DIAGNOSIS — I82412 Acute embolism and thrombosis of left femoral vein: Secondary | ICD-10-CM | POA: Diagnosis not present

## 2017-11-26 DIAGNOSIS — Z8542 Personal history of malignant neoplasm of other parts of uterus: Secondary | ICD-10-CM

## 2017-11-26 DIAGNOSIS — J9621 Acute and chronic respiratory failure with hypoxia: Secondary | ICD-10-CM | POA: Diagnosis present

## 2017-11-26 DIAGNOSIS — Z6841 Body Mass Index (BMI) 40.0 and over, adult: Secondary | ICD-10-CM

## 2017-11-26 DIAGNOSIS — Z923 Personal history of irradiation: Secondary | ICD-10-CM

## 2017-11-26 DIAGNOSIS — J9611 Chronic respiratory failure with hypoxia: Secondary | ICD-10-CM | POA: Diagnosis present

## 2017-11-26 DIAGNOSIS — Z7401 Bed confinement status: Secondary | ICD-10-CM

## 2017-11-26 DIAGNOSIS — E119 Type 2 diabetes mellitus without complications: Secondary | ICD-10-CM | POA: Diagnosis present

## 2017-11-26 DIAGNOSIS — T50996A Underdosing of other drugs, medicaments and biological substances, initial encounter: Secondary | ICD-10-CM | POA: Diagnosis present

## 2017-11-26 DIAGNOSIS — Z9981 Dependence on supplemental oxygen: Secondary | ICD-10-CM

## 2017-11-26 DIAGNOSIS — C541 Malignant neoplasm of endometrium: Secondary | ICD-10-CM | POA: Diagnosis present

## 2017-11-26 DIAGNOSIS — I10 Essential (primary) hypertension: Secondary | ICD-10-CM | POA: Diagnosis present

## 2017-11-26 DIAGNOSIS — J189 Pneumonia, unspecified organism: Secondary | ICD-10-CM | POA: Diagnosis present

## 2017-11-26 DIAGNOSIS — Z993 Dependence on wheelchair: Secondary | ICD-10-CM

## 2017-11-26 DIAGNOSIS — R000 Tachycardia, unspecified: Secondary | ICD-10-CM | POA: Diagnosis present

## 2017-11-26 DIAGNOSIS — N938 Other specified abnormal uterine and vaginal bleeding: Secondary | ICD-10-CM | POA: Diagnosis present

## 2017-11-26 LAB — CBC WITH DIFFERENTIAL/PLATELET
BASOS ABS: 0 10*3/uL (ref 0.0–0.1)
BASOS PCT: 0 %
Eosinophils Absolute: 0.3 10*3/uL (ref 0.0–0.7)
Eosinophils Relative: 3 %
HEMATOCRIT: 40.3 % (ref 36.0–46.0)
Hemoglobin: 13.1 g/dL (ref 12.0–15.0)
LYMPHS PCT: 18 %
Lymphs Abs: 1.7 10*3/uL (ref 0.7–4.0)
MCH: 30.3 pg (ref 26.0–34.0)
MCHC: 32.5 g/dL (ref 30.0–36.0)
MCV: 93.3 fL (ref 78.0–100.0)
Monocytes Absolute: 1 10*3/uL (ref 0.1–1.0)
Monocytes Relative: 11 %
NEUTROS ABS: 6.6 10*3/uL (ref 1.7–7.7)
Neutrophils Relative %: 68 %
PLATELETS: 396 10*3/uL (ref 150–400)
RBC: 4.32 MIL/uL (ref 3.87–5.11)
RDW: 14.6 % (ref 11.5–15.5)
WBC: 9.7 10*3/uL (ref 4.0–10.5)

## 2017-11-26 LAB — COMPREHENSIVE METABOLIC PANEL
ALBUMIN: 3.1 g/dL — AB (ref 3.5–5.0)
ALT: 12 U/L — AB (ref 14–54)
AST: 14 U/L — AB (ref 15–41)
Alkaline Phosphatase: 70 U/L (ref 38–126)
Anion gap: 13 (ref 5–15)
BILIRUBIN TOTAL: 0.5 mg/dL (ref 0.3–1.2)
BUN: 7 mg/dL (ref 6–20)
CHLORIDE: 99 mmol/L — AB (ref 101–111)
CO2: 21 mmol/L — ABNORMAL LOW (ref 22–32)
CREATININE: 0.74 mg/dL (ref 0.44–1.00)
Calcium: 10.2 mg/dL (ref 8.9–10.3)
GFR calc Af Amer: >60 mL/min (ref >60)
GLUCOSE: 150 mg/dL — AB (ref 65–99)
Potassium: 3.5 mmol/L (ref 3.5–5.1)
Sodium: 133 mmol/L — ABNORMAL LOW (ref 135–145)
Total Protein: 6.8 g/dL (ref 6.5–8.1)

## 2017-11-26 MED ORDER — IOPAMIDOL (ISOVUE-370) INJECTION 76%
INTRAVENOUS | Status: AC
  Administered 2017-11-26: 100 mL
  Filled 2017-11-26: qty 100

## 2017-11-26 NOTE — ED Notes (Signed)
Patient transported to CT 

## 2017-11-26 NOTE — H&P (Addendum)
History and Physical    Theresa Blake ZSM:[id number] DOB: January 31, 1941 DOA: 11/26/2017  Referring MD/NP/PA: Dr. Winfred Leeds PCP: Tracie Harrier, MD  Patient coming from: home via EMS  Chief Complaint: Left leg pain and need meds refilled  I have personally briefly reviewed patient's old medical records in [place]   HPI: Theresa Blake is a 77 y.o. 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  The patient is a poor historian and much of the history is obtained from review of records.  Last admitted at [facility] on 1/21, found to have extensive left leg DVT that was found not to be amenable to thrombolysis.  It appears patient was ultimately sent home on Eliquis.   patient reports being unable to get her psychiatric medications refilled as she is bedbound and unable to be transported back and forth with her son to doctor's visits.   ED Course: Upon admission to the emergency department patient was noted to be afebrile, pulse 105-121, respiration 15-24, blood pressure 109/83-173/70, and O2 saturations 91-96% on room air.  Labs revealed normal CBC, sodium 133, potassium 3.5, chloride 99, CO2 21, BUN 7, creatinine 0.74, glucose 150.  Due to persistent tachycardia CT angiogram was obtained revealing bilateral pulmonary emboli and patchy airspace opacities concerning for edema/pneumonia.   Review of Systems  Constitutional: Negative for malaise/fatigue and weight loss.  HENT: Negative for ear discharge and nosebleeds.   Eyes: Negative for photophobia and pain.  Respiratory: Negative for sputum production and shortness of breath.   Cardiovascular: Positive for leg swelling. Negative for chest pain.  Gastrointestinal: Negative for abdominal pain and diarrhea.  Genitourinary: Negative for dysuria  and frequency.  Musculoskeletal: Positive for myalgias. Negative for joint pain.  Skin: Positive for rash. Negative for itching.  Neurological: Negative for focal weakness and loss of consciousness.  Endo/Heme/Allergies: Does not bruise/bleed easily.  Psychiatric/Behavioral: Negative for substance abuse. The patient is nervous/anxious.     Past Medical History:  Diagnosis Date  . Abnormal Pap smear of cervix   . Allergic rhinitis, cause unspecified   . Backache, unspecified   . Depression    mood disorder; ? bipolar  . Dermatomycosis, unspecified   . Diabetes mellitus type II   . Edema   . Foot fracture, right   . HLD (hyperlipidemia)   . HTN (hypertension)   . Hx pulmonary embolism    multiple  . Lumbar spinal stenosis   . Malignant neoplasm of corpus uteri, except isthmus ([place])   . Morbid obesity ([place])   . Other specified disease of hair and hair follicles   . Pain in joint, pelvic region and thigh   . Panic disorder without agoraphobia     Past Surgical History:  Procedure Laterality Date  . BREAST BIOPSY     Right-benign  . ENDOMETRIAL BIOPSY  08/2006   attempted  . IVC FILTER INSERTION N/A 11/17/2017   Procedure: IVC FILTER INSERTION;  Surgeon: Katha Cabal, MD;  Location: [facility];  Service: Cardiovascular;  Laterality: N/A;  . LOWER EXTREMITY VENOGRAPHY N/A 11/17/2017   Procedure: LOWER EXTREMITY VENOGRAPHY;  Surgeon: Katha Cabal, MD;  Location: [facility];  Service: Cardiovascular;  Laterality: N/A;  . TONSILLECTOMY    . TUBAL LIGATION       reports  that  has never smoked. she has never used smokeless tobacco. She reports that she does not drink alcohol or use drugs.  Allergies  Allergen Reactions  . Anesthetics, Halogenated Shortness Of Breath  . Levaquin [Levofloxacin In D5w] Itching  . Lamotrigine Itching  . Tramadol Rash    Family History  Problem Relation Age of Onset  . Heart attack Father 21  . Pancreatic cancer  Mother        with mets  . Hyperlipidemia Son   . Hyperlipidemia Son   . Gout Son     Prior to Admission medications   Medication Sig Start Date End Date Taking? Authorizing Provider  acetaminophen (TYLENOL) 500 MG tablet Take 1,000 mg by mouth every 6 (six) hours as needed for mild pain, moderate pain, fever or headache.   Yes [provider]  apixaban (ELIQUIS) 2.5 MG TABS tablet Take 5 mg by mouth 2 (two) times daily.   Yes [provider]  buPROPion (WELLBUTRIN XL) 150 MG 24 hr tablet Take 450 mg by mouth daily.    Yes [provider]  clonazePAM (KLONOPIN) 0.5 MG tablet Take 0.5 mg by mouth 2 (two) times daily. 11/02/17  Yes [provider]  diphenhydrAMINE (BENADRYL) 25 MG tablet Take 25 mg by mouth every 6 (six) hours as needed for allergies.   Yes [provider]  ezetimibe-simvastatin (VYTORIN) 10-20 MG tablet Take 1 tablet by mouth at bedtime.    Yes [provider]  fluticasone (FLONASE) 50 MCG/ACT nasal spray Place 2 sprays into both nostrils daily.   Yes [provider]  furosemide (LASIX) 20 MG tablet Take 20 mg by mouth daily. 06/17/16  Yes [provider]  levothyroxine (SYNTHROID, LEVOTHROID) 50 MCG tablet Take 1 tablet (50 mcg total) by mouth daily before breakfast. 02/28/14  Yes Einar Pheasant, MD  lisinopril (PRINIVIL,ZESTRIL) 20 MG tablet TAKE 1 TABLET BY MOUTH EVERY DAY Patient taking differently: TAKE 1 TABLET (20 MG)  BY MOUTH EVERY DAY 04/12/14  Yes Einar Pheasant, MD  megestrol (MEGACE) 40 MG tablet Take 2 tablets (80 mg total) by mouth 2 (two) times daily. 11/19/17  Yes Fritzi Mandes, MD  Multiple Vitamin (MULTIVITAMIN WITH MINERALS) TABS tablet Take 1 tablet by mouth daily.   Yes [provider]  mupirocin ointment (BACTROBAN) 2 % Place 1 application into the nose at bedtime.   Yes [provider]  omeprazole (PRILOSEC) 20 MG capsule TAKE ONE CAPSULE BY MOUTH EVERY DAY *NEEDS OFFICE  VISIT* Patient taking differently: TAKE ONE CAPSULE (20 MG) BY MOUTH EVERY DAY *NEEDS OFFICE VISIT*   Yes Einar Pheasant, MD  OXYGEN Inhale 2 L into the lungs continuous.   Yes [provider]  venlafaxine (EFFEXOR-XR) 150 MG 24 hr capsule Take 300 mg by mouth daily.     Yes [provider]  apixaban (ELIQUIS) 5 MG TABS tablet Take 2 tablets (10 mg total) by mouth 2 (two) times daily. Start taking 5 mg bid from 11/26/17 Patient not taking: Reported on 11/26/2017 11/19/17   Fritzi Mandes, MD  Blood Glucose Monitoring Suppl (ACCU-CHEK AVIVA PLUS) W/DEVICE KIT by Does not apply route.    [provider]  glucose blood (ACCU-CHEK AVIVA PLUS) test strip 1 each by Other route as needed for other. Use as instructed    [provider]    Physical Exam:  Constitutional: NAD, calm, comfortable Vitals:   11/26/17 2115 11/26/17 2130 11/26/17 2230 11/26/17 2245  BP: 135/70 (!) 147/90 Marland Kitchen)  115/95 113/72  Pulse: (!) 108 (!) 121 (!) 111 (!) 105  Resp: 20 (!) 24 18 (!) 23  Temp:      TempSrc:      SpO2: 93% 95% 92% 91%  Weight:      Height:       Eyes: PERRL, lids and conjunctivae normal ENMT: Mucous membranes are moist. Posterior pharynx clear of any exudate or lesions.Normal dentition.  Neck: normal, supple, no masses, no thyromegaly Respiratory: clear to auscultation bilaterally, no wheezing, no crackles. Normal respiratory effort. No accessory muscle use.  Cardiovascular: Regular rate and rhythm, no murmurs / rubs / gallops. No extremity edema. 2+ pedal pulses. No carotid bruits.  Abdomen: no tenderness, no masses palpated. No hepatosplenomegaly. Bowel sounds positive.  Musculoskeletal: no clubbing / cyanosis. No joint deformity upper and lower extremities. Good ROM, no contractures. Normal muscle tone.  Skin: no rashes, lesions, ulcers. No induration Neurologic: CN 2-12 grossly intact. Sensation intact, DTR normal. Strength 5/5 in all 4.  Psychiatric: Normal judgment  and insight. Alert and oriented x 3. Normal mood.     Labs on Admission: I have personally reviewed following labs and imaging studies  CBC: Recent Labs  Lab 11/26/17 2147  WBC 9.7  NEUTROABS 6.6  HGB 13.1  HCT 40.3  MCV 93.3  PLT 176   Basic Metabolic Panel: Recent Labs  Lab 11/26/17 2147  NA 133*  K 3.5  CL 99*  CO2 21*  GLUCOSE 150*  BUN 7  CREATININE 0.74  CALCIUM 10.2   GFR: Estimated Creatinine Clearance: 95.1 mL/min (by C-G formula based on SCr of 0.74 mg/dL). Liver Function Tests: Recent Labs  Lab 11/26/17 2147  AST 14*  ALT 12*  ALKPHOS 70  BILITOT 0.5  PROT 6.8  ALBUMIN 3.1*   No results for input(s): LIPASE, AMYLASE in the last 168 hours. No results for input(s): AMMONIA in the last 168 hours. Coagulation Profile: No results for input(s): INR, PROTIME in the last 168 hours. Cardiac Enzymes: No results for input(s): CKTOTAL, CKMB, CKMBINDEX, TROPONINI in the last 168 hours. BNP (last 3 results) No results for input(s): PROBNP in the last 8760 hours. HbA1C: No results for input(s): HGBA1C in the last 72 hours. CBG: No results for input(s): GLUCAP in the last 168 hours. Lipid Profile: No results for input(s): CHOL, HDL, LDLCALC, TRIG, CHOLHDL, LDLDIRECT in the last 72 hours. Thyroid Function Tests: No results for input(s): TSH, T4TOTAL, FREET4, T3FREE, THYROIDAB in the last 72 hours. Anemia Panel: No results for input(s): VITAMINB12, FOLATE, FERRITIN, TIBC, IRON, RETICCTPCT in the last 72 hours. Urine analysis:    Component Value Date/Time   COLORURINE YELLOW 04/08/2012 1622   APPEARANCEUR CLEAR 04/08/2012 1622   LABSPEC 1.016 04/08/2012 1622   PHURINE 8.0 04/08/2012 1622   GLUCOSEU NEGATIVE 04/08/2012 1622   HGBUR NEGATIVE 04/08/2012 1622   HGBUR negative 02/10/2010 1135   BILIRUBINUR neg 06/11/2013 1510   KETONESUR NEGATIVE 04/08/2012 1622   PROTEINUR neg 06/11/2013 1510   PROTEINUR NEGATIVE 04/08/2012 1622   UROBILINOGEN 0.2  06/11/2013 1510   UROBILINOGEN 1.0 04/08/2012 1622   NITRITE pos 06/11/2013 1510   NITRITE NEGATIVE 04/08/2012 1622   LEUKOCYTESUR moderate (2+) 06/11/2013 1510   Sepsis Labs: No results found for this or any previous visit (from the past 240 hour(s)).   Radiological Exams on Admission: Ct Angio Chest Pe W And/or Wo Contrast  Result Date: 11/26/2017 CLINICAL DATA:  Patient was recently seen at [facility] 4 days ago for deep  venous thrombosis of the left leg. Shortness of breath. EXAM: CT ANGIOGRAPHY CHEST WITH CONTRAST TECHNIQUE: Multidetector CT imaging of the chest was performed using the standard protocol during bolus administration of intravenous contrast. Multiplanar CT image reconstructions and MIPs were obtained to evaluate the vascular anatomy. CONTRAST:  113m ISOVUE-370 IOPAMIDOL (ISOVUE-370) INJECTION 76% COMPARISON:  06/19/2017 FINDINGS: Cardiovascular: Multiple filling defects demonstrated in the segmental and subsegmental pulmonary arteries to the upper lungs and lower lungs bilaterally. Examination is positive for acute deep venous thrombosis. Heart size is normal. RV to LV ratio is normal, suggesting no evidence of right heart strain. Normal caliber thoracic aorta. Scattered aortic calcifications. No pericardial effusions. Mediastinum/Nodes: No significant lymphadenopathy in the chest. Esophagus is decompressed. Scattered calcified lymph nodes in the mediastinum. Lungs/Pleura: Motion artifact limits evaluation. Patchy airspace infiltrates in both lungs may represent edema or multifocal pneumonia. Small peripheral lung infarcts could also be present although the pattern is more likely to represent a diffuse process. No pleural effusions. No pneumothorax. Airways are patent. Upper Abdomen: No acute process demonstrated in the visualized upper abdomen. Musculoskeletal: Degenerative changes in the spine. No destructive bone lesions. Review of the MIP images confirms the above findings.  IMPRESSION: 1. Positive examination for multiple bilateral pulmonary emboli. No evidence of right heart strain. 2. Patchy airspace infiltrates in both lungs most likely to represent edema or multifocal pneumonia. Small peripheral infarcts less likely. 3. These results were called by telephone at the time of interpretation on 11/26/2017 at 11:25 pm to Dr. SOrlie Dakin, who verbally acknowledged these results. Aortic Atherosclerosis (ICD10-I70.0). Electronically Signed   By: WLucienne CapersM.D.   On: 11/26/2017 23:27    CT angiogram of chest: Independently reviewed.  Showing bilateral pulmonary emboli  Assessment/Plan Pulmonary embolus, recent left lower extremity DVT: Acute.  Patient found to be significantly tachycardic on admission which a CT angiogram was ultimately obtained given recent extensive left lower extremity DVT.  Patient also noted not to be on correct dose of Eliquis. - Admit to telemetry bed - Heparin gtt per pharmacy - Check echocardiogram in a.m., question possible need repeat Doppler ultrasound of left leg - Will likely need to restarted on Eliquis at correct dose  Chronic respiratory failure with hypoxia: Patient currently maintaining O2 saturations on settings of nasal cannula oxygen O2 3 L. - Continuous pulse oximetry with nasal cannula oxygen  Uterine cancer with intermittent  bleeding: Patient still has persistent uterine bleeding, but hemoglobin appears stable at 13.1. - Continue Megace - Recheck CBC in a.m.  - continue outpatient f/u as   Essential hypertension - Continue lisinopril and furosemide  Anxiety: Stable.  Patient reports being unable to get prescriptions due to difficulty getting back and forth to appointments. - Continue Effexor, Wellbutrin, and clonazepam prn  Hypothyroidism - Check TSH - Continue levothyroxine  Hyperlipidemia - Continue Vytorin  Morbid obese, bedbound: BMI 50.06 - Consult social work and case management  GERD - Continue  substitution of Protonix   DVT prophylaxis: heparin Code Status: full  Family Communication: No family present at bedside Disposition Plan: tbd  Consults called: none Admission status: Observation  RNorval MortonMD [facility] Pager [phone]  If 7PM-7AM, please contact night-coverage [email or web address] Password [email or web address] 11/26/2017, 11:43 PM

## 2017-11-26 NOTE — ED Triage Notes (Signed)
Pt BIB EMS from home for left leg pain and medication refills. Per EMS, pt bedridden and unable to get meds refilled and pt recently seen at [facility] 4 days ago for DVT of left leg, pt can not recall the same at this time. Pt A&Ox4, does not always answer questions appropriately. Bilat pedal pulses found via doppler. Resp e/u on 3L [place] at home.

## 2017-11-26 NOTE — ED Notes (Signed)
Pt back from CT

## 2017-11-26 NOTE — Progress Notes (Signed)
ANTICOAGULATION CONSULT NOTE - Initial Consult  Pharmacy Consult for Heparin  Indication: pulmonary embolus, recent DVT on 11/14/17  Allergies  Allergen Reactions  . Anesthetics, Halogenated Shortness Of Breath  . Levaquin [Levofloxacin In D5w] Itching  . Lamotrigine Itching  . Tramadol Rash    Patient Measurements: Height: 5\' 6"  (167.6 cm) Weight: (!) 350 lb (158.8 kg) IBW/kg (Calculated) : 59.3  Heparin dosing weight: 99.5 kg  Vital Signs: Temp: 99 F (37.2 C) (02/02 1851) Temp Source: Oral (02/02 1851) BP: 113/72 (02/02 2245) Pulse Rate: 105 (02/02 2245)  Labs: Recent Labs    11/26/17 2147  HGB 13.1  HCT 40.3  PLT 396  CREATININE 0.74    Estimated Creatinine Clearance: 95.1 mL/min (by C-G formula based on SCr of 0.74 mg/dL).   Medical History: Past Medical History:  Diagnosis Date  . Abnormal Pap smear of cervix   . Allergic rhinitis, cause unspecified   . Backache, unspecified   . Depression    mood disorder; ? bipolar  . Dermatomycosis, unspecified   . Diabetes mellitus type II   . Edema   . Foot fracture, right   . HLD (hyperlipidemia)   . HTN (hypertension)   . Hx pulmonary embolism    multiple  . Lumbar spinal stenosis   . Malignant neoplasm of corpus uteri, except isthmus ([place])   . Morbid obesity ([place])   . Other specified disease of hair and hair follicles   . Pain in joint, pelvic region and thigh   . Panic disorder without agoraphobia     Assessment: 77 y/o F who has been on apixaban in the past for hx of PE, she was admitted to [facility] on 1/21 with new onset DVT>>treated with heparin>>then transitioned back to apixaban.   Pt has hx of medication non-compliance and it appears she may have never picked up the new prescription for the higher dose of apixaban, ?taking the 2.5 mg dose since DC from [place]  She now presents to the [facility] with leg pain, found to have new onset bilateral PE via CT Angio. Baseline heparin level of 1.96 does suggest  she has been taking apixaban, but again, not sure at what dose. Will need to use aPTT to dose heparin for the next several days.   Goal of Therapy:  Heparin level 0.3-0.7 units/ml aPTT 66-102 seconds Monitor platelets by anticoagulation protocol: Yes   Plan:  -Heparin 5000 units BOLUS -Start heparin drip at 1650 units/hr (based on previous therapeutic dosing) -1000 heparin level/aPTT -Daily CBC/HL/aPTT -Monitor for bleeding   Narda Bonds 11/26/2017,11:35 PM

## 2017-11-26 NOTE — ED Provider Notes (Signed)
[facility] EMERGENCY DEPARTMENT Provider Note   CSN: [id number] Arrival date & time: 11/26/17  4944     History   Chief Complaint Chief Complaint  Patient presents with  . Leg Pain  . Medication Refill    HPI Theresa Blake is a 77 y.o. female with PMH of recent left LE DVT on Eliquis, grade 1 endometrial carcinoma s/p EBRT and SBRT due to inability to operate due to medical comorbidities, now with Mirena IUD in place since 06/2014, obesity, chronic immobility/bedbound state, depression, and anxiety presenting with need for medication refills, leg pain, and vaginal bleeding. Patient is a poor historian and does not always answer questions appropriately. She is accompanied by her son who most of the history was obtained from.   Per patient and her son she ran out of her klonopin, venlafaxine, and wellbutrin 2 days ago. The patient states her anxiety has been very bad and she has been having anxiety attacks. Her son has been giving her 0.5 mg TID klonopin as needed. Her last dose was prior to coming to the ED. Per chart review, the patient had previously been seeing Dr. Ginette Pitman with Internal Medicine at [facility]. She had no showed and canceled greater than 10 appointments and had not been seen since 06/03/16. She was dismissed from the clinic.   The patient denies chest pain, shortness of breath, abdominal pain, nausea, or vomiting. She does complain of left leg pain, which she has a known left lower extremity venous thrombus, through the IVC to the level of the renal veins. Patient's son states has been giving her Eliquis. The patient lives with her son. Per son there has been issues with home care and he seems to be having difficulty taking care of the patient himself. Patient is non-ambulatory and bed bound. She uses a bed pan and eats all her meals in bed.   The patient has also also been experiencing vaginal bleeding. Prior to her most recent hospitalization, she had stopped  taking her anticoagulation (hx of PE) for a month due to the vaginal bleeding. She subsequently developed a DVT. The patient was evaluated by Ob/Gyn during her most recent hospitalization. Per chart review the patient refuses to return to [facility] for gyn onc care and has no showed appointments at [facility] as well. Per Dr. Guido Sander consult note "her uterine/vaginal bleeding will continue without treatment, but treating her thrombotic disease far outweighs this, and I would not delay or stop treatment because of her bleeding or disease progression."  The patient has declined care for her cancer in the past.      Past Medical History:  Diagnosis Date  . Abnormal Pap smear of cervix   . Allergic rhinitis, cause unspecified   . Backache, unspecified   . Depression    mood disorder; ? bipolar  . Dermatomycosis, unspecified   . Diabetes mellitus type II   . Edema   . Foot fracture, right   . HLD (hyperlipidemia)   . HTN (hypertension)   . Hx pulmonary embolism    multiple  . Lumbar spinal stenosis   . Malignant neoplasm of corpus uteri, except isthmus ([place])   . Morbid obesity ([place])   . Other specified disease of hair and hair follicles   . Pain in joint, pelvic region and thigh   . Panic disorder without agoraphobia     Patient Active Problem List   Diagnosis Date Noted  . DVT (deep venous thrombosis) ([place]) 11/14/2017  .  Weakness   . Palliative care by specialist   . Vaginal bleeding 06/19/2017  . History of endometrial cancer   . Acute respiratory failure with hypoxia ([place])   . Multiple rib fractures 06/30/2016  . Broken ribs 06/30/2016  . Goals of care, counseling/discussion 03/23/2014  . Hypoxia 04/29/2013  . Spinal stenosis of lumbar region 04/12/2012  . Neck strain 11/05/2011  . Wound of right leg 11/05/2011  . Cellulitis and abscess of leg 10/13/2011  . Sinusitis 10/13/2011  . Vertigo 10/13/2011  . Other screening mammogram 03/15/2011  . BACK PAIN, LUMBAR 12/22/2010    . HIP PAIN, RIGHT 12/21/2010  . ALLERGIC RHINITIS 07/22/2010  . Morbid obesity due to excess calories ([place]) 02/27/2010  . FUNGAL DERMATITIS 08/15/2009  . Hypothyroidism 07/13/2007  . Diabetes mellitus type 2 in obese ([place]) 03/24/2007  . Hyperlipidemia 03/24/2007  . PANIC DISORDER 03/24/2007  . Depression 03/24/2007  . Essential hypertension 03/24/2007  . FOLLICULITIS 60/45/4098  . BACK PAIN 03/24/2007  . EDEMA 03/24/2007  . PULMONARY EMBOLISM, HX OF 03/24/2007    Past Surgical History:  Procedure Laterality Date  . BREAST BIOPSY     Right-benign  . ENDOMETRIAL BIOPSY  08/2006   attempted  . IVC FILTER INSERTION N/A 11/17/2017   Procedure: IVC FILTER INSERTION;  Surgeon: Katha Cabal, MD;  Location: [facility] CV LAB;  Service: Cardiovascular;  Laterality: N/A;  . LOWER EXTREMITY VENOGRAPHY N/A 11/17/2017   Procedure: LOWER EXTREMITY VENOGRAPHY;  Surgeon: Katha Cabal, MD;  Location: [facility] CV LAB;  Service: Cardiovascular;  Laterality: N/A;  . TONSILLECTOMY    . TUBAL LIGATION      OB History    No data available       Home Medications    Prior to Admission medications   Medication Sig Start Date End Date Taking? Authorizing Provider  acetaminophen (TYLENOL) 500 MG tablet Take 1,000 mg by mouth every 6 (six) hours as needed for mild pain, moderate pain, fever or headache.   Yes [provider]  buPROPion (WELLBUTRIN XL) 150 MG 24 hr tablet Take 450 mg by mouth daily.    Yes [provider]  clonazePAM (KLONOPIN) 0.5 MG tablet Take 0.5 mg by mouth 2 (two) times daily. 11/02/17  Yes [provider]  diphenhydrAMINE (BENADRYL) 25 MG tablet Take 25 mg by mouth every 6 (six) hours as needed for allergies.   Yes [provider]  ezetimibe-simvastatin (VYTORIN) 10-20 MG tablet Take 1 tablet by mouth at bedtime.    Yes [provider]  fluticasone (FLONASE) 50 MCG/ACT nasal spray Place 2 sprays into both nostrils  daily.   Yes [provider]  furosemide (LASIX) 20 MG tablet Take 20 mg by mouth daily. 06/17/16  Yes [provider]  levothyroxine (SYNTHROID, LEVOTHROID) 50 MCG tablet Take 1 tablet (50 mcg total) by mouth daily before breakfast. 02/28/14  Yes Einar Pheasant, MD  lisinopril (PRINIVIL,ZESTRIL) 20 MG tablet TAKE 1 TABLET BY MOUTH EVERY DAY Patient taking differently: TAKE 1 TABLET (20 MG)  BY MOUTH EVERY DAY 04/12/14  Yes Einar Pheasant, MD  megestrol (MEGACE) 40 MG tablet Take 2 tablets (80 mg total) by mouth 2 (two) times daily. 11/19/17  Yes Fritzi Mandes, MD  Multiple Vitamin (MULTIVITAMIN WITH MINERALS) TABS tablet Take 1 tablet by mouth daily.   Yes [provider]  mupirocin ointment (BACTROBAN) 2 % Place 1 application into the nose at bedtime.   Yes [provider]  omeprazole (PRILOSEC) 20  MG capsule TAKE ONE CAPSULE BY MOUTH EVERY DAY *NEEDS OFFICE VISIT* Patient taking differently: TAKE ONE CAPSULE (20 MG) BY MOUTH EVERY DAY *NEEDS OFFICE VISIT*   Yes Einar Pheasant, MD  venlafaxine (EFFEXOR-XR) 150 MG 24 hr capsule Take 300 mg by mouth daily.     Yes [provider]  apixaban (ELIQUIS) 5 MG TABS tablet Take 2 tablets (10 mg total) by mouth 2 (two) times daily. Start taking 5 mg bid from 11/26/17 11/19/17   Fritzi Mandes, MD  Blood Glucose Monitoring Suppl (ACCU-CHEK AVIVA PLUS) W/DEVICE KIT by Does not apply route.    [provider]  clonazePAM (KLONOPIN) 1 MG tablet Take 1 mg by mouth 3 (three) times daily as needed for anxiety. For anxiety    [provider]  glucose blood (ACCU-CHEK AVIVA PLUS) test strip 1 each by Other route as needed for other. Use as instructed    [provider]    Family History Family History  Problem Relation Age of Onset  . Heart attack Father 32  . Pancreatic cancer Mother        with mets  . Hyperlipidemia Son   . Hyperlipidemia Son   . Gout Son     Social History Social History     Tobacco Use  . Smoking status: Never Smoker  . Smokeless tobacco: Never Used  Substance Use Topics  . Alcohol use: No  . Drug use: No     Allergies   Anesthetics, halogenated; Levaquin [levofloxacin in d5w]; Lamotrigine; and Tramadol   Review of Systems Review of Systems  Constitutional: Positive for chills and fever (Subjective).  Respiratory: Negative for shortness of breath.   Cardiovascular: Positive for leg swelling. Negative for chest pain.  Gastrointestinal: Negative for abdominal pain, nausea and vomiting.  Genitourinary: Positive for vaginal bleeding. Negative for vaginal pain.     Physical Exam Updated Vital Signs BP (!) 147/90   Pulse (!) 121   Temp 99 F (37.2 C) (Oral)   Resp (!) 24   Ht _0  (1.676 m)   Wt (!) 158.8 kg (350 lb)   SpO2 95%   BMI 56.49 kg/m   Physical Exam  Constitutional: She is oriented to person, place, and time. She appears distressed.  Obese, unkept, anxious appearing female.   Eyes: Conjunctivae are normal. No scleral icterus.  Pulmonary/Chest: Effort normal and breath sounds normal.  Abdominal: Soft. Bowel sounds are normal. There is no tenderness.  Musculoskeletal: She exhibits edema.  Left Lower extremity: swelling, erythema, and TTP when compared to RLE; left foot with significant erythema and pitting edma   Neurological: She is alert and oriented to person, place, and time. No cranial nerve deficit.  Patient exhibits bilateral upper extremity tremor.   Skin: Skin is warm, dry and intact.  No sacral or pressure ulcers noted on exam.   Psychiatric: Her mood appears anxious. Her speech is tangential.  Patient is AOx4 but does not answer all questions appropriately.      ED Treatments / Results  Labs (all labs ordered are listed, but only abnormal results are displayed) Labs Reviewed  CBC WITH DIFFERENTIAL/PLATELET  COMPREHENSIVE METABOLIC PANEL    EKG  EKG Interpretation None       Radiology No results  found.  Procedures Procedures (including critical care time)   Medications Ordered in ED Medications - No data to display   Initial Impression / Assessment and Plan / ED Course  I have reviewed the triage vital signs  and the nursing notes.  Pertinent labs & imaging results that were available during my care of the patient were reviewed by me and considered in my medical decision making (see chart for details).   77 yo female presenting with the chief complaint of needing medications refilled. Per chart review she has been dismissed from her previous PCPs clinic due to numerous no shows. This is likely why she no longer has her medications. The patient is very anxious, has a tremor, and speaks tangentially. She has been receiving klonopin from her son, 0.5 mg TID. Her previous dose was 1 mg TID as needed. She has also been out of her Effexor and Wellbutrin for the past few days.   Patient also has a known left lower extremity venous thrombus, through the IVC to the level of the renal veins. She did not have a suprarenal IVC filter placed due to the extensive nature of the clot, thrombolysis was not performed. She is tachycardic, on chronic 2 L [place] at home, saturating 92% on 3 liters. Patient with persistent tachycardia into 110-115 while in the ED, concerning for PE. Currently, no dyspnea or pleuritic chest pain. Will get CBC in setting of chronic vaginal bleeding, CMET, and CTA of the chest to r/o PE. If patient has a PE, will need admission to hospitalist service.   Do not feel that patient is safe to be discharged home. Will likely stay in ED overnight if CTA negative for PE. Will need case management or social work.   2229: CTA chest., CBC, and BMET pending  Patient signed out to Dr. Winfred Leeds   Final Clinical Impressions(s) / ED Diagnoses   Final diagnoses:  None    ED Discharge Orders    None       Melanee Spry, MD 11/26/17 2210    Melanee Spry, MD 11/26/17  2230    Orlie Dakin, MD 11/27/17 4114

## 2017-11-26 NOTE — ED Notes (Signed)
Called to check on pt in CT.  Pt is doing fine per CT staff.

## 2017-11-26 NOTE — ED Notes (Signed)
ED Provider at bedside. 

## 2017-11-26 NOTE — ED Provider Notes (Signed)
Patient is an extremely poor historian.  Her son is also a poor historian history is obtained from patient and from patient's son and from all hospital records.  Patient was recently discharged from [facility] with DVT of the left leg on Eliquis.  She has not been taking Eliquis dose properly.  She had an extensive DVT of the left lower extremity.  She was not a candidate for an IVC filter as the DVT was too extensive.  She presents with continued left leg pain.  Her son also reports that she is unable to get to her doctors for follow-up as she is bedbound.  Patient had visit from home health agency to deliver hospital bed which she refused.  Patient also refuses nursing home or assisted living.  She has run out of all of her psychiatric medications as well.  On exam she is in no respiratory distress.  HEENT exam no facial asymmetry neck supple heart tachycardic regular rhythm lungs clear to auscultation abdomen morbidly obese, nontender.  Left lower extremity is markedly edematous and reddened though not hot to the touch.  3+ pitting edema above and below the knee.  Right lower extremity without edema.  There is no temperature differential between right and left lower extremities. IV heparin per pharmacy ordered. Clinically patient does not have pneumonia no cough no fever no leukocytosis lungs clear to auscultation.  I also strongly doubt CHF.  She is lying flat without dyspnea. Dr. Tamala Julian from hospitalist service consulted and will arrange for overnight stay. Diagnosis bilateral pulmonary emboli CRITICAL CARE Performed by: Orlie Dakin Total critical care time: 40 minutes Critical care time was exclusive of separately billable procedures and treating other patients. Critical care was necessary to treat or prevent imminent or life-threatening deterioration. Critical care was time spent personally by me on the following activities: development of treatment plan with patient and/or  surrogate as well as nursing, discussions with consultants, evaluation of patient's response to treatment, examination of patient, obtaining history from patient or surrogate, ordering and performing treatments and interventions, ordering and review of laboratory studies, ordering and review of radiographic studies, pulse oximetry and re-evaluation of patient's condition.   Orlie Dakin, MD 11/26/17 2356

## 2017-11-26 NOTE — ED Notes (Signed)
This RN called over to CT, stating they are waiting for lab work to check kidney function, will come get pt after results.

## 2017-11-27 ENCOUNTER — Encounter (HOSPITAL_COMMUNITY): Payer: Self-pay | Admitting: *Deleted

## 2017-11-27 ENCOUNTER — Observation Stay (HOSPITAL_COMMUNITY): Payer: Medicare Other

## 2017-11-27 DIAGNOSIS — J9611 Chronic respiratory failure with hypoxia: Secondary | ICD-10-CM | POA: Diagnosis not present

## 2017-11-27 DIAGNOSIS — T50996A Underdosing of other drugs, medicaments and biological substances, initial encounter: Secondary | ICD-10-CM | POA: Diagnosis present

## 2017-11-27 DIAGNOSIS — E785 Hyperlipidemia, unspecified: Secondary | ICD-10-CM | POA: Diagnosis present

## 2017-11-27 DIAGNOSIS — M48061 Spinal stenosis, lumbar region without neurogenic claudication: Secondary | ICD-10-CM | POA: Diagnosis present

## 2017-11-27 DIAGNOSIS — Z6841 Body Mass Index (BMI) 40.0 and over, adult: Secondary | ICD-10-CM | POA: Diagnosis not present

## 2017-11-27 DIAGNOSIS — I2699 Other pulmonary embolism without acute cor pulmonale: Secondary | ICD-10-CM | POA: Diagnosis present

## 2017-11-27 DIAGNOSIS — K219 Gastro-esophageal reflux disease without esophagitis: Secondary | ICD-10-CM | POA: Diagnosis present

## 2017-11-27 DIAGNOSIS — F329 Major depressive disorder, single episode, unspecified: Secondary | ICD-10-CM | POA: Diagnosis present

## 2017-11-27 DIAGNOSIS — Z993 Dependence on wheelchair: Secondary | ICD-10-CM | POA: Diagnosis not present

## 2017-11-27 DIAGNOSIS — Z7401 Bed confinement status: Secondary | ICD-10-CM | POA: Diagnosis not present

## 2017-11-27 DIAGNOSIS — R402413 Glasgow coma scale score 13-15, at hospital admission: Secondary | ICD-10-CM | POA: Diagnosis present

## 2017-11-27 DIAGNOSIS — I10 Essential (primary) hypertension: Secondary | ICD-10-CM | POA: Diagnosis not present

## 2017-11-27 DIAGNOSIS — J9621 Acute and chronic respiratory failure with hypoxia: Secondary | ICD-10-CM | POA: Diagnosis present

## 2017-11-27 DIAGNOSIS — N938 Other specified abnormal uterine and vaginal bleeding: Secondary | ICD-10-CM | POA: Diagnosis present

## 2017-11-27 DIAGNOSIS — N939 Abnormal uterine and vaginal bleeding, unspecified: Secondary | ICD-10-CM | POA: Diagnosis present

## 2017-11-27 DIAGNOSIS — I82422 Acute embolism and thrombosis of left iliac vein: Secondary | ICD-10-CM | POA: Diagnosis not present

## 2017-11-27 DIAGNOSIS — F411 Generalized anxiety disorder: Secondary | ICD-10-CM | POA: Diagnosis present

## 2017-11-27 DIAGNOSIS — E039 Hypothyroidism, unspecified: Secondary | ICD-10-CM | POA: Diagnosis present

## 2017-11-27 DIAGNOSIS — E119 Type 2 diabetes mellitus without complications: Secondary | ICD-10-CM | POA: Diagnosis present

## 2017-11-27 DIAGNOSIS — Z8542 Personal history of malignant neoplasm of other parts of uterus: Secondary | ICD-10-CM | POA: Diagnosis not present

## 2017-11-27 DIAGNOSIS — C541 Malignant neoplasm of endometrium: Secondary | ICD-10-CM | POA: Diagnosis present

## 2017-11-27 DIAGNOSIS — F410 Panic disorder [episodic paroxysmal anxiety] without agoraphobia: Secondary | ICD-10-CM | POA: Diagnosis present

## 2017-11-27 DIAGNOSIS — I5031 Acute diastolic (congestive) heart failure: Secondary | ICD-10-CM | POA: Diagnosis not present

## 2017-11-27 DIAGNOSIS — I82402 Acute embolism and thrombosis of unspecified deep veins of left lower extremity: Secondary | ICD-10-CM | POA: Diagnosis present

## 2017-11-27 DIAGNOSIS — Z9981 Dependence on supplemental oxygen: Secondary | ICD-10-CM | POA: Diagnosis not present

## 2017-11-27 DIAGNOSIS — J189 Pneumonia, unspecified organism: Secondary | ICD-10-CM | POA: Diagnosis present

## 2017-11-27 DIAGNOSIS — R000 Tachycardia, unspecified: Secondary | ICD-10-CM | POA: Diagnosis present

## 2017-11-27 LAB — BASIC METABOLIC PANEL
Anion gap: 10 (ref 5–15)
BUN: 6 mg/dL (ref 6–20)
CALCIUM: 10.3 mg/dL (ref 8.9–10.3)
CO2: 25 mmol/L (ref 22–32)
CREATININE: 0.86 mg/dL (ref 0.44–1.00)
Chloride: 103 mmol/L (ref 101–111)
GFR calc Af Amer: >60 mL/min (ref >60)
GFR calc non Af Amer: >60 mL/min (ref >60)
GLUCOSE: 147 mg/dL — AB (ref 65–99)
Potassium: 3.7 mmol/L (ref 3.5–5.1)
Sodium: 138 mmol/L (ref 135–145)

## 2017-11-27 LAB — PROTIME-INR
INR: 1.37
Prothrombin Time: 16.7 seconds — ABNORMAL HIGH (ref 11.4–15.2)

## 2017-11-27 LAB — CBC
HCT: 39.9 % (ref 36.0–46.0)
Hemoglobin: 12.9 g/dL (ref 12.0–15.0)
MCH: 30.6 pg (ref 26.0–34.0)
MCHC: 32.3 g/dL (ref 30.0–36.0)
MCV: 94.5 fL (ref 78.0–100.0)
Platelets: 379 10*3/uL (ref 150–400)
RBC: 4.22 MIL/uL (ref 3.87–5.11)
RDW: 15 % (ref 11.5–15.5)
WBC: 8.9 10*3/uL (ref 4.0–10.5)

## 2017-11-27 LAB — TSH: TSH: 5.42 u[IU]/mL — ABNORMAL HIGH (ref 0.350–4.500)

## 2017-11-27 LAB — APTT
APTT: 41 s — AB (ref 24–36)
APTT: 94 s — AB (ref 24–36)
aPTT: 96 seconds — ABNORMAL HIGH (ref 24–36)

## 2017-11-27 LAB — HEPARIN LEVEL (UNFRACTIONATED)
HEPARIN UNFRACTIONATED: 1.7 [IU]/mL — AB (ref 0.30–0.70)
HEPARIN UNFRACTIONATED: 1.96 [IU]/mL — AB (ref 0.30–0.70)

## 2017-11-27 LAB — BRAIN NATRIURETIC PEPTIDE: B Natriuretic Peptide: 51.4 pg/mL (ref 0.0–100.0)

## 2017-11-27 MED ORDER — DIPHENHYDRAMINE HCL 25 MG PO CAPS: 25.0000 mg | ORAL_CAPSULE | Freq: Four times a day (QID) | ORAL | Status: DC | PRN

## 2017-11-27 MED ORDER — FUROSEMIDE 20 MG PO TABS
20.0000 mg | ORAL_TABLET | Freq: Every day | ORAL | Status: DC
  Administered 2017-11-28 – 2017-11-30 (×3): 20 mg via ORAL
  Filled 2017-11-27 (×3): qty 1

## 2017-11-27 MED ORDER — MEGESTROL ACETATE 40 MG PO TABS
80.0000 mg | ORAL_TABLET | Freq: Two times a day (BID) | ORAL | Status: DC
  Administered 2017-11-27 – 2017-11-30 (×8): 80 mg via ORAL
  Filled 2017-11-27 (×8): qty 2

## 2017-11-27 MED ORDER — ACETAMINOPHEN 325 MG PO TABS: 650.0000 mg | ORAL_TABLET | Freq: Four times a day (QID) | ORAL | Status: DC | PRN

## 2017-11-27 MED ORDER — HEPARIN (PORCINE) IN NACL 100-0.45 UNIT/ML-% IJ SOLN
1650.0000 [IU]/h | INTRAMUSCULAR | Status: DC
  Administered 2017-11-27 – 2017-11-28 (×4): 1650 [IU]/h via INTRAVENOUS
  Filled 2017-11-27 (×5): qty 250

## 2017-11-27 MED ORDER — LEVOTHYROXINE SODIUM 50 MCG PO TABS
50.0000 ug | ORAL_TABLET | Freq: Every day | ORAL | Status: DC
  Administered 2017-11-27 – 2017-11-30 (×4): 50 ug via ORAL
  Filled 2017-11-27 (×4): qty 1

## 2017-11-27 MED ORDER — PANTOPRAZOLE SODIUM 40 MG PO TBEC
40.0000 mg | DELAYED_RELEASE_TABLET | Freq: Every day | ORAL | Status: DC
  Administered 2017-11-27 – 2017-11-30 (×4): 40 mg via ORAL
  Filled 2017-11-27 (×4): qty 1

## 2017-11-27 MED ORDER — ONDANSETRON HCL 4 MG PO TABS: 4.0000 mg | ORAL_TABLET | Freq: Four times a day (QID) | ORAL | Status: DC | PRN

## 2017-11-27 MED ORDER — BUPROPION HCL ER (XL) 150 MG PO TB24
450.0000 mg | ORAL_TABLET | Freq: Every day | ORAL | Status: DC
  Administered 2017-11-27 – 2017-11-30 (×4): 450 mg via ORAL
  Filled 2017-11-27 (×4): qty 3

## 2017-11-27 MED ORDER — EZETIMIBE-SIMVASTATIN 10-20 MG PO TABS
1.0000 | ORAL_TABLET | Freq: Every day | ORAL | Status: DC
  Administered 2017-11-27 – 2017-11-29 (×4): 1 via ORAL
  Filled 2017-11-27 (×4): qty 1

## 2017-11-27 MED ORDER — MUPIROCIN 2 % EX OINT
1.0000 "application " | TOPICAL_OINTMENT | Freq: Every day | CUTANEOUS | Status: DC
  Administered 2017-11-27 – 2017-11-29 (×4): 1 via NASAL
  Filled 2017-11-27: qty 22

## 2017-11-27 MED ORDER — ACETAMINOPHEN 650 MG RE SUPP: 650.0000 mg | Freq: Four times a day (QID) | RECTAL | Status: DC | PRN

## 2017-11-27 MED ORDER — HEPARIN BOLUS VIA INFUSION
5000.0000 [IU] | Freq: Once | INTRAVENOUS | Status: AC
  Administered 2017-11-27: 5000 [IU] via INTRAVENOUS
  Filled 2017-11-27: qty 5000

## 2017-11-27 MED ORDER — LISINOPRIL 10 MG PO TABS
20.0000 mg | ORAL_TABLET | Freq: Every day | ORAL | Status: DC
  Administered 2017-11-27 – 2017-11-29 (×3): 20 mg via ORAL
  Filled 2017-11-27 (×4): qty 2

## 2017-11-27 MED ORDER — IPRATROPIUM-ALBUTEROL 0.5-2.5 (3) MG/3ML IN SOLN: 3.0000 mL | RESPIRATORY_TRACT | Status: DC | PRN

## 2017-11-27 MED ORDER — FLUTICASONE PROPIONATE 50 MCG/ACT NA SUSP
2.0000 | Freq: Every day | NASAL | Status: DC
  Administered 2017-11-27 – 2017-11-30 (×4): 2 via NASAL
  Filled 2017-11-27: qty 16

## 2017-11-27 MED ORDER — CLONAZEPAM 0.5 MG PO TABS
0.5000 mg | ORAL_TABLET | Freq: Every day | ORAL | Status: DC | PRN
  Administered 2017-11-28 – 2017-11-30 (×3): 0.5 mg via ORAL
  Filled 2017-11-27 (×3): qty 1

## 2017-11-27 MED ORDER — ONDANSETRON HCL 4 MG/2ML IJ SOLN: 4.0000 mg | Freq: Four times a day (QID) | INTRAMUSCULAR | Status: DC | PRN

## 2017-11-27 NOTE — Progress Notes (Addendum)
ANTICOAGULATION CONSULT NOTE - Follow Up Consult  Pharmacy Consult for Heparin  Indication: pulmonary embolus, recent DVT on 11/14/17  Allergies  Allergen Reactions  . Anesthetics, Halogenated Shortness Of Breath  . Levaquin [Levofloxacin In D5w] Itching  . Lamotrigine Itching  . Tramadol Rash    Patient Measurements: Height: 5\' 7"  (170.2 cm) Weight: (!) 319 lb 10.7 oz (145 kg) IBW/kg (Calculated) : 61.6  Heparin dosing weight: 99.5 kg  Vital Signs: Temp: 99.4 F (37.4 C) (02/03 0553) Temp Source: Oral (02/03 0553) BP: 125/63 (02/03 0900) Pulse Rate: 107 (02/03 0553)  Labs: Recent Labs    11/26/17 2147 11/27/17 0005 11/27/17 0516 11/27/17 0933  HGB 13.1  --  12.9  --   HCT 40.3  --  39.9  --   PLT 396  --  379  --   APTT  --  41*  --  96*  LABPROT  --  16.7*  --   --   INR  --  1.37  --   --   HEPARINUNFRC  --  1.96*  --  1.70*  CREATININE 0.74  --  0.86  --     Estimated Creatinine Clearance: 84.8 mL/min (by C-G formula based on SCr of 0.86 mg/dL).  Medications: Heparin @ 1650 units/hr  Assessment: 77 y/o F who has been on apixaban in the past for hx of PE, she was admitted to [facility] on 1/21 with new onset DVT>>treated with heparin>>then transitioned back to apixaban.   Pt has hx of medication non-compliance and it appears she may have never picked up the new prescription for the higher dose of apixaban, ? taking the 2.5 mg dose since DC from [facility].  She presented to the [facility] with leg pain, found to have new onset bilateral PE via CT Angio. Baseline heparin level of 1.96 suggested she had been taking apixaban, but again, not sure at what dose. She was started on heparin. Using aPTTs to guide initial dosing.  Initial aPTT is therapeutic at 96 seconds. Heparin level elevated 1.7 but starting to trend down. CBC stable. No bleeding.  Goal of Therapy:  Heparin level 0.3-0.7 units/ml aPTT 66-102 seconds Monitor platelets by anticoagulation protocol: Yes    Plan:  1) Continue heparin at 1650 units/hr 2) Check 8 hour aPTT to confirm  Deboraha Sprang 11/27/2017,11:23 AM   ADDENDUM: Confirmatory aPTT is therapeutic at 94 seconds. Continue heparin at 1650 units/hr and follow up daily heparin level, aPTT, CBC.  Deboraha Sprang 11/27/2017, 7:32 PM

## 2017-11-27 NOTE — Progress Notes (Signed)
PROGRESS NOTE    WEALTHY DANIELSKI  TOI:[id number] DOB: 10/09/1941 DOA: 11/26/2017 PCP: Tracie Harrier, MD    Brief Narrative:  77 year old female with multiple medical problems, recently admitted to [facility] regional with extensive left lower extremity DVT.  She was discharged on Eliquis, but there is a question if she remain compliant with this medication.  She was admitted with bilateral pulmonary emboli.  She is currently on intravenous heparin.  DVT is extensive, extending into IVC.  She is already been evaluated by vascular surgery not felt to be a candidate for thrombolysis.  She has a history of uterine cancer and follow-up with gynecology at [facility] has been arranged.  Will likely need placement.  Physical therapy evaluation requested.  If she can go to a nursing home, would likely benefit from long-term Lovenox therapy with her active malignancy.   Assessment & Plan:   Active Problems:   Hypothyroidism   Morbid obesity due to excess calories (HCC)   Essential hypertension   Bilateral pulmonary embolism (HCC)   Uterine bleeding   Endometrial cancer (HCC)   Left leg DVT (HCC)   Chronic respiratory failure with hypoxia ([place])   1. Acute on chronic respiratory failure with hypoxia.  Chronically on 3 L.  Currently requiring 5 L of oxygen.  Related to bilateral pulmonary emboli.  On anticoagulation. 2. Bilateral pulmonary emboli.  History of PE in the past.  Also has acute left lower extremity DVT.  History of uterine cancer which is predisposing factor.  She will need lifelong anticoagulation.  History of noncompliance with Eliquis.  With her active malignancy, may be more appropriate for Lovenox.  This will be easier to administer since she is agreeing to skilled nursing facility placement.  Echocardiogram has been ordered to evaluate for right heart strain. 3. Acute left lower extremity DVT.  Extensive DVT in left leg veins, extending into IVC at the level of renal veins.  She was  evaluated by vascular surgery at [facility] regional during her last hospital where a venogram was attempted.  It was felt that her clot was too extensive for thrombolysis to be successful.  She was not felt to be a candidate for IVC due to the extensive nature of her clot, since a suprarenal filter would likely end up clotting off her renal veins which would lead to worsening renal failure.  Discussed with Dr. Donnetta Hutching who did not recommend any further interventions.  He did agree with continued anticoagulation. 4. Uterine cancer with intermittent vaginal bleeding.  Seen by gynecology at [facility].  Started on Megace.  She has been lost to follow-up at [facility].  Previously treated with radiation therapy.  Follow-up has been scheduled at [place] at [facility]. 5. Morbid obesity 6. Bedbound status.  Patient reports that she does get around in a wheelchair.  Will request physical therapy.  Will likely benefit from skilled nursing facility placement.  She previously refused any placement, but is now agreeable.   DVT prophylaxis: heparin infusion Code Status: full code Family Communication: no family present Disposition Plan: discussed with patient and she is agreeable to SNF placement. Will request physical therapy evaluation   Consultants:     Procedures:   Echo pending  Antimicrobials:      Subjective: No chest pain. Poor historian. Complains of feeling hot and cold  Objective: Vitals:   11/26/17 2245 11/27/17 0150 11/27/17 0553 11/27/17 0900  BP: 113/72 (!) 132/16 (!) 149/80 125/63  Pulse: (!) 105 (!) 106 Marland Kitchen)  107   Resp: (!) 23 17 14  (!) 23  Temp:  97.7 F (36.5 C) 99.4 F (37.4 C)   TempSrc:  Oral Oral   SpO2: 91% 94% 95%   Weight:  (!) 145 kg (319 lb 10.7 oz)    Height:  5\' 7"  (1.702 m)      Intake/Output Summary (Last 24 hours) at 11/27/2017 0956 Last data filed at 11/27/2017 0455 Gross per 24 hour  Intake 59.13 ml  Output -  Net 59.13 ml    Filed Weights   11/26/17 1851 11/26/17 1904 11/27/17 0150  Weight: (!) 158.8 kg (350 lb) (!) 158.8 kg (350 lb) (!) 145 kg (319 lb 10.7 oz)    Examination:  General exam: Appears calm and comfortable  Respiratory system: bilateral rhonchi. Respiratory effort normal. Cardiovascular system: S1 & S2 heard, tachycardic. No JVD, murmurs, rubs, gallops or clicks.  Gastrointestinal system: Abdomen is nondistended, soft and nontender. No organomegaly or masses felt. Normal bowel sounds heard. Central nervous system: Alert and oriented. No focal neurological deficits. Extremities: LLE with 3+ edema, warm to touch Skin: No rashes, lesions or ulcers Psychiatry: difficult historian, confused at times     Data Reviewed: I have personally reviewed following labs and imaging studies  CBC: Recent Labs  Lab 11/26/17 2147 11/27/17 0516  WBC 9.7 8.9  NEUTROABS 6.6  --   HGB 13.1 12.9  HCT 40.3 39.9  MCV 93.3 94.5  PLT 396 222   Basic Metabolic Panel: Recent Labs  Lab 11/26/17 2147 11/27/17 0516  NA 133* 138  K 3.5 3.7  CL 99* 103  CO2 21* 25  GLUCOSE 150* 147*  BUN 7 6  CREATININE 0.74 0.86  CALCIUM 10.2 10.3   GFR: Estimated Creatinine Clearance: 84.8 mL/min (by C-G formula based on SCr of 0.86 mg/dL). Liver Function Tests: Recent Labs  Lab 11/26/17 2147  AST 14*  ALT 12*  ALKPHOS 70  BILITOT 0.5  PROT 6.8  ALBUMIN 3.1*   No results for input(s): LIPASE, AMYLASE in the last 168 hours. No results for input(s): AMMONIA in the last 168 hours. Coagulation Profile: Recent Labs  Lab 11/27/17 0005  INR 1.37   Cardiac Enzymes: No results for input(s): CKTOTAL, CKMB, CKMBINDEX, TROPONINI in the last 168 hours. BNP (last 3 results) No results for input(s): PROBNP in the last 8760 hours. HbA1C: No results for input(s): HGBA1C in the last 72 hours. CBG: No results for input(s): GLUCAP in the last 168 hours. Lipid Profile: No results for input(s): CHOL, HDL, LDLCALC,  TRIG, CHOLHDL, LDLDIRECT in the last 72 hours. Thyroid Function Tests: Recent Labs    11/27/17 0516  TSH 5.420*   Anemia Panel: No results for input(s): VITAMINB12, FOLATE, FERRITIN, TIBC, IRON, RETICCTPCT in the last 72 hours. Sepsis Labs: No results for input(s): PROCALCITON, LATICACIDVEN in the last 168 hours.  No results found for this or any previous visit (from the past 240 hour(s)).       Radiology Studies: Ct Angio Chest Pe W And/or Wo Contrast  Result Date: 11/26/2017 CLINICAL DATA:  Patient was recently seen at [place] 4 days ago for deep venous thrombosis of the left leg. Shortness of breath. EXAM: CT ANGIOGRAPHY CHEST WITH CONTRAST TECHNIQUE: Multidetector CT imaging of the chest was performed using the standard protocol during bolus administration of intravenous contrast. Multiplanar CT image reconstructions and MIPs were obtained to evaluate the vascular anatomy. CONTRAST:  165mL ISOVUE-370 IOPAMIDOL (ISOVUE-370) INJECTION 76% COMPARISON:  06/19/2017 FINDINGS: Cardiovascular: Multiple  filling defects demonstrated in the segmental and subsegmental pulmonary arteries to the upper lungs and lower lungs bilaterally. Examination is positive for acute deep venous thrombosis. Heart size is normal. RV to LV ratio is normal, suggesting no evidence of right heart strain. Normal caliber thoracic aorta. Scattered aortic calcifications. No pericardial effusions. Mediastinum/Nodes: No significant lymphadenopathy in the chest. Esophagus is decompressed. Scattered calcified lymph nodes in the mediastinum. Lungs/Pleura: Motion artifact limits evaluation. Patchy airspace infiltrates in both lungs may represent edema or multifocal pneumonia. Small peripheral lung infarcts could also be present although the pattern is more likely to represent a diffuse process. No pleural effusions. No pneumothorax. Airways are patent. Upper Abdomen: No acute process demonstrated in the visualized upper abdomen.  Musculoskeletal: Degenerative changes in the spine. No destructive bone lesions. Review of the MIP images confirms the above findings. IMPRESSION: 1. Positive examination for multiple bilateral pulmonary emboli. No evidence of right heart strain. 2. Patchy airspace infiltrates in both lungs most likely to represent edema or multifocal pneumonia. Small peripheral infarcts less likely. 3. These results were called by telephone at the time of interpretation on 11/26/2017 at 11:25 pm to Dr. Orlie Dakin , who verbally acknowledged these results. Aortic Atherosclerosis (ICD10-I70.0). Electronically Signed   By: Lucienne Capers M.D.   On: 11/26/2017 23:27        Scheduled Meds: . buPROPion  450 mg Oral Daily  . ezetimibe-simvastatin  1 tablet Oral QHS  . fluticasone  2 spray Each Nare Daily  . [START ON 11/28/2017] furosemide  20 mg Oral Daily  . levothyroxine  50 mcg Oral QAC breakfast  . lisinopril  20 mg Oral Daily  . megestrol  80 mg Oral BID  . mupirocin ointment  1 application Nasal QHS  . pantoprazole  40 mg Oral Daily   Continuous Infusions: . heparin 1,650 Units/hr (11/27/17 0120)     LOS: 0 days    Time spent: 63mins    Kathie Dike, MD [facility] Pager [phone]  If 7PM-7AM, please contact night-coverage [email or web address] Password [facility] 11/27/2017, 9:56 AM

## 2017-11-28 ENCOUNTER — Inpatient Hospital Stay (HOSPITAL_COMMUNITY): Payer: Medicare Other

## 2017-11-28 DIAGNOSIS — I10 Essential (primary) hypertension: Secondary | ICD-10-CM

## 2017-11-28 DIAGNOSIS — I5031 Acute diastolic (congestive) heart failure: Secondary | ICD-10-CM

## 2017-11-28 DIAGNOSIS — I82422 Acute embolism and thrombosis of left iliac vein: Secondary | ICD-10-CM

## 2017-11-28 DIAGNOSIS — J9611 Chronic respiratory failure with hypoxia: Secondary | ICD-10-CM

## 2017-11-28 DIAGNOSIS — I2699 Other pulmonary embolism without acute cor pulmonale: Principal | ICD-10-CM

## 2017-11-28 LAB — CBC
HCT: 40 % (ref 36.0–46.0)
Hemoglobin: 12.9 g/dL (ref 12.0–15.0)
MCH: 30.6 pg (ref 26.0–34.0)
MCHC: 32.3 g/dL (ref 30.0–36.0)
MCV: 94.8 fL (ref 78.0–100.0)
PLATELETS: 385 10*3/uL (ref 150–400)
RBC: 4.22 MIL/uL (ref 3.87–5.11)
RDW: 15.1 % (ref 11.5–15.5)
WBC: 9.4 10*3/uL (ref 4.0–10.5)

## 2017-11-28 LAB — ECHOCARDIOGRAM COMPLETE
Height: 67 in
Weight: 5365.11 oz

## 2017-11-28 MED ORDER — PERFLUTREN LIPID MICROSPHERE
1.0000 mL | INTRAVENOUS | Status: AC | PRN
  Filled 2017-11-28: qty 10

## 2017-11-28 NOTE — Progress Notes (Signed)
ANTICOAGULATION CONSULT NOTE - Follow Up Consult  Pharmacy Consult for Heparin  Indication: pulmonary embolus, recent DVT on 11/14/17  Allergies  Allergen Reactions  . Anesthetics, Halogenated Shortness Of Breath  . Levaquin [Levofloxacin In D5w] Itching  . Lamotrigine Itching  . Tramadol Rash    Patient Measurements: Height: 5\' 7"  (170.2 cm) Weight: (!) 335 lb 5.1 oz (152.1 kg) IBW/kg (Calculated) : 61.6  Heparin dosing weight: 99.5 kg  Vital Signs: Temp: 98 F (36.7 C) (02/04 0627) Temp Source: Oral (02/04 0627) BP: 119/90 (02/04 0627) Pulse Rate: 106 (02/04 0627)  Labs: Recent Labs    11/26/17 2147 11/27/17 0005 11/27/17 0516 11/27/17 0933 11/27/17 1756  HGB 13.1  --  12.9  --   --   HCT 40.3  --  39.9  --   --   PLT 396  --  379  --   --   APTT  --  41*  --  96* 94*  LABPROT  --  16.7*  --   --   --   INR  --  1.37  --   --   --   HEPARINUNFRC  --  1.96*  --  1.70*  --   CREATININE 0.74  --  0.86  --   --     Estimated Creatinine Clearance: 87.3 mL/min (by C-G formula based on SCr of 0.86 mg/dL).  Medications: Heparin @ 1650 units/hr  Assessment: 77 y/o F On apixaban in the past for hx of PE, she was admitted to [facility] on 1/21 with new onset DVT>>treated with heparin>>then transitioned back to apixaban. Has hx of medication non-compliance and it appears she may have never picked up the new prescription for the higher dose of apixaban, ?taking the 2.5 mg dose since DC from [place]. She now presents to the [facility] with leg pain, found to have new onset bilateral PE via CT Angio.Baseline heparin level of 1.96 does suggest she has been taking apixaban, but again, not sure at what dose. Will need to use aPTT to dose heparin for the next several days.  aPTT 94 therapeutic. Per MD note, might switch to lovenox for long-term anticoagulation   Goal of Therapy:  Heparin level 0.3-0.7 units/ml aPTT 66-102 seconds Monitor platelets by anticoagulation protocol: Yes    Plan:  1) Continue heparin at 1650 units/hr 2) Daily aPTT and anti-Xa level 3) f/u plans for long-term anticoagulation.   Maryanna Shape, PharmD, BCPS  Clinical Pharmacist  Pager: [phone]  11/28/2017,8:52 AM   11/27/2017, 7:32 PM

## 2017-11-28 NOTE — Evaluation (Signed)
Physical Therapy Evaluation Patient Details Name: Theresa Blake MRN: [id number] DOB: 12-12-1940 Today's Date: 11/28/2017   History of Present Illness  Theresa Blake is a 77 y.o. female with PMH of recent left LE DVT on Eliquis, grade 1 endometrial carcinoma now with Mirena IUD in place since 06/2014, obesity, chronic immobility/bedbound state, depression, and anxiety presenting with need for medication refills, leg pain, and vaginal bleeding. CT + for bilateral pulmonary emboli and patchy airspace opacities concerning for edema/pneumonia and acute on chronic resp failure  Clinical Impression  Pt admitted with/for the above problems and complications.  Pt needs 2 person maximal assist at this time for basic mobility in the bed.  Pt currently limited functionally due to the problems listed. ( See problems list.)   Pt will benefit from PT to maximize function and safety in order to get ready for next venue listed below.     Follow Up Recommendations SNF    Equipment Recommendations  Other (comment)(TBA)    Recommendations for Other Services       Precautions / Restrictions Precautions Precautions: Fall Restrictions Weight Bearing Restrictions: No      Mobility  Bed Mobility Overal bed mobility: Needs Assistance Bed Mobility: Rolling;Sidelying to Sit;Sit to Supine Rolling: Mod assist;+2 for physical assistance Sidelying to sit: Max assist;+2 for physical assistance   Sit to supine: Max assist;+2 for physical assistance   General bed mobility comments: significant truncal assist and management of the LE's esp the L LE  Transfers                 General transfer comment: unsafe/unable  Ambulation/Gait             General Gait Details: unsafe/unable  Stairs            Wheelchair Mobility    Modified Rankin (Stroke Patients Only)       Balance Overall balance assessment: Needs assistance Sitting-balance support: Bilateral upper extremity  supported;Feet supported Sitting balance-Leahy Scale: Fair Sitting balance - Comments: poor to fair Postural control: Posterior lean Standing balance support: Single extremity supported;No upper extremity supported Standing balance-Leahy Scale: Fair Standing balance comment: pt could hold statically, but when kicking her legs out or moving ouside BOS pt would fall posteriorly.                             Pertinent Vitals/Pain Pain Assessment: Faces Faces Pain Scale: Hurts little more Pain Location: L LE Pain Descriptors / Indicators: Aching;Tightness;Sore Pain Intervention(s): Limited activity within patient's tolerance    Home Living Family/patient expects to be discharged to:: Skilled nursing facility Living Arrangements: Children Available Help at Discharge: Family;Available 24 hours/day Type of Home: House Home Access: Ramped entrance     Home Layout: Two level;Able to live on main level with bedroom/bathroom Home Equipment: Gilford Rile - 2 wheels;Bedside commode;Wheelchair - manual      Prior Function Level of Independence: Needs assistance   Gait / Transfers Assistance Needed: Pt reports up until 4-6 months ago she was transferring to [facility] and W/C by herself, but has been mainly bed bound since then  ADL's / Homemaking Assistance Needed: son A with bed level ADLs        Hand Dominance   Dominant Hand: Right    Extremity/Trunk Assessment   Upper Extremity Assessment Upper Extremity Assessment: Generalized weakness    Lower Extremity Assessment Lower Extremity Assessment: Generalized weakness  Communication   Communication: No difficulties  Cognition Arousal/Alertness: Awake/alert Behavior During Therapy: WFL for tasks assessed/performed Overall Cognitive Status: Within Functional Limits for tasks assessed                                 General Comments: does have trouble getting her thoughts out from time to time       General Comments      Exercises General Exercises - Lower Extremity Long Arc Quad: AROM;Both;5 reps;Seated   Assessment/Plan    PT Assessment Patient needs continued PT services  PT Problem List Decreased strength;Decreased range of motion;Decreased activity tolerance;Decreased balance;Decreased mobility;Decreased coordination;Decreased safety awareness;Decreased knowledge of use of DME;Decreased knowledge of precautions;Cardiopulmonary status limiting activity;Pain       PT Treatment Interventions DME instruction;Gait training;Functional mobility training;Therapeutic activities;Therapeutic exercise;Balance training;Patient/family education    PT Goals (Current goals can be found in the Care Plan section)  Acute Rehab PT Goals Patient Stated Goal: to go home, but open to SNF PT Goal Formulation: With patient/family Time For Goal Achievement: 12/12/17 Potential to Achieve Goals: Fair    Frequency Min 2X/week   Barriers to discharge        Co-evaluation PT/OT/SLP Co-Evaluation/Treatment: Yes Reason for Co-Treatment: For patient/therapist safety;To address functional/ADL transfers PT goals addressed during session: Mobility/safety with mobility OT goals addressed during session: Strengthening/ROM       AM-PAC PT "6 Clicks" Daily Activity  Outcome Measure Difficulty turning over in bed (including adjusting bedclothes, sheets and blankets)?: Unable Difficulty moving from lying on back to sitting on the side of the bed? : Unable Difficulty sitting down on and standing up from a chair with arms (e.g., wheelchair, bedside commode, etc,.)?: Unable Help needed moving to and from a bed to chair (including a wheelchair)?: Total Help needed walking in hospital room?: Total Help needed climbing 3-5 steps with a railing? : Total 6 Click Score: 6    End of Session Equipment Utilized During Treatment: Oxygen Activity Tolerance: Patient tolerated treatment well Patient left: in  bed;with call bell/phone within reach;with bed alarm set;with nursing/sitter in room;with family/visitor present Nurse Communication: Mobility status PT Visit Diagnosis: Muscle weakness (generalized) (M62.81);Other abnormalities of gait and mobility (R26.89) Pain - Right/Left: Left Pain - part of body: Leg    Time: 9233-0076 PT Time Calculation (min) (ACUTE ONLY): 41 min   Charges:   PT Evaluation $PT Eval Moderate Complexity: 1 Mod     PT G Codes:        12-Dec-2017  Donnella Sham, PT [phone]  (pager)  Tessie Fass Kiyona Mcnall 12/12/17, 5:47 PM

## 2017-11-28 NOTE — Progress Notes (Signed)
  Echocardiogram 2D Echocardiogram has been performed.  Merrie Roof F 11/28/2017, 11:36 AM

## 2017-11-28 NOTE — Evaluation (Signed)
Occupational Therapy Evaluation Patient Details Name: Theresa Blake MRN: [id number] DOB: Mar 27, 1941 Today's Date: 11/28/2017    History of Present Illness Theresa Blake is a 77 y.o. female with PMH of recent left LE DVT on Eliquis, grade 1 endometrial carcinoma now with Mirena IUD in place since 06/2014, obesity, chronic immobility/bedbound state, depression, and anxiety presenting with need for medication refills, leg pain, and vaginal bleeding. CT + for bilateral pulmonary emboli and patchy airspace opacities concerning for edema/pneumonia and acute on chronic resp failure   Clinical Impression   This 77 yo female admitted with above presents to acute OT with increased pain, LLE edema, increased need for O2 due to PEs, and decreased mobility all affecting her safety and independence with basic ADLs. She will benefit from acute OT with follow up OT at SNF.    Follow Up Recommendations  SNF;Supervision/Assistance - 24 hour    Equipment Recommendations  Other (comment)(hoyer lift)       Precautions / Restrictions Precautions Precautions: Fall Restrictions Weight Bearing Restrictions: No      Mobility Bed Mobility Overal bed mobility: Needs Assistance Bed Mobility: Rolling;Sidelying to Sit;Sit to Supine Rolling: Mod assist;+2 for physical assistance Sidelying to sit: Max assist;+2 for physical assistance   Sit to supine: Max assist;+2 for physical assistance         Balance Overall balance assessment: Needs assistance Sitting-balance support: Bilateral upper extremity supported;Feet supported   Sitting balance - Comments: poor to fair Postural control: Posterior lean                                 ADL either performed or assessed with clinical judgement   ADL Overall ADL's : Needs assistance/impaired Eating/Feeding: Independent;Bed level   Grooming: Set up;Bed level   Upper Body Bathing: Moderate assistance;Bed level   Lower Body Bathing: Total  assistance;Bed level   Upper Body Dressing : Maximal assistance;Bed level   Lower Body Dressing: Total assistance;Bed level                       Vision Patient Visual Report: No change from baseline              Pertinent Vitals/Pain Pain Assessment: Faces Faces Pain Scale: Hurts little more Pain Location: L LE Pain Descriptors / Indicators: Aching;Tightness;Sore Pain Intervention(s): Limited activity within patient's tolerance;Monitored during session;Repositioned     Hand Dominance Right   Extremity/Trunk Assessment Upper Extremity Assessment Upper Extremity Assessment: Generalized weakness(resting tremors)   Lower Extremity Assessment Lower Extremity Assessment: (resting tremors)       Communication Communication Communication: No difficulties   Cognition Arousal/Alertness: Awake/alert Behavior During Therapy: Anxious Overall Cognitive Status: Within Functional Limits for tasks assessed                                 General Comments: does have trouble getting her thoughts out from time to time              Home Living Family/patient expects to be discharged to:: Skilled nursing facility Living Arrangements: Children Available Help at Discharge: Family;Available 24 hours/day Type of Home: House Home Access: Ramped entrance     Home Layout: Two level;Able to live on main level with bedroom/bathroom               Home Equipment: Gilford Rile - 2  wheels;Bedside commode;Wheelchair - manual          Prior Functioning/Environment Level of Independence: Needs assistance  Gait / Transfers Assistance Needed: Pt reports up until 4-6 months ago she was transferring to [facility] and W/C by herself, but has been mainly bed bound since then ADL's / Homemaking Assistance Needed: son A with bed level ADLs            OT Problem List: Decreased strength;Decreased range of motion;Impaired balance (sitting and/or  standing);Pain;Obesity;Increased edema      OT Treatment/Interventions: Balance training;Self-care/ADL training;Therapeutic activities;DME and/or AE instruction;Patient/family education    OT Goals(Current goals can be found in the care plan section) Acute Rehab OT Goals Patient Stated Goal: to go home, but open to SNF OT Goal Formulation: With patient Time For Goal Achievement: 12/12/17 Potential to Achieve Goals: Good  OT Frequency: Min 2X/week           Co-evaluation PT/OT/SLP Co-Evaluation/Treatment: Yes(partial) Reason for Co-Treatment: For patient/therapist safety   OT goals addressed during session: Strengthening/ROM      AM-PAC PT "6 Clicks" Daily Activity     Outcome Measure Help from another person eating meals?: None Help from another person taking care of personal grooming?: None Help from another person toileting, which includes using toliet, bedpan, or urinal?: Total Help from another person bathing (including washing, rinsing, drying)?: A Lot Help from another person to put on and taking off regular upper body clothing?: A Lot Help from another person to put on and taking off regular lower body clothing?: Total 6 Click Score: 14   End of Session Nurse Communication: (pt cleaned up from vaginal bleeding)  Activity Tolerance: Patient tolerated treatment well Patient left: in bed;with call bell/phone within reach;with bed alarm set  OT Visit Diagnosis: Muscle weakness (generalized) (M62.81);Pain Pain - Right/Left: Left Pain - part of body: Leg                Time: 5638-7564 OT Time Calculation (min): 41 min Charges:  OT General Charges $OT Visit: 1 Visit OT Evaluation $OT Eval Moderate Complexity: 1 Mod OT Treatments $Therapeutic Activity: 8-22 mins Golden Circle, OTR/L [phone] 11/28/2017

## 2017-11-28 NOTE — Progress Notes (Signed)
PROGRESS NOTE    MIKAELA HILGEMAN  JGG:[id number] DOB: [birth year] DOA: 11/26/2017 PCP: Tracie Harrier, MD   Brief Narrative:  Patient is a 77 y.o. y/o female with history of recent admission for DVT in left lower extremity, pulmonary embolism, morbidly obese, bed bound, oxygen dependent on 3L at home, uterine cancer with bleeding , presents to the ED with left leg pain. She was last admitted to [facility] on 1/21 for extensive pain with movement on left lower extremities, suprarenal filter palcement was considered until the vascular surgeon found the thrombus in IVC was too extensive for the patient to benefit from the filter, therefore surgery was terminated and patient was sent home with Eliquis.  On initial physical examination in ED, she was afebrile, tachycardic, lab finding within normal limits.  CT angiogram shows multiple bilateral pulmonary emboli and patchy airspace infiltrates likely to represent edema or multifocal pneumonia. She was admitted for bilateral pulmonary emboli in the setting of DVT in left lower extremity.   Assessment & Plan:   Bilateral pulmonary emboli in the setting of DVT in LLE -Patient reports constant pain in left leg, found to have extensive swelling and mild erythema.  She is currently on 5L oxygen via nasal cannula and denies shortness of breath or chest pain. -Given her extensive DVT per vascular surgery in [place], surgery is not considered -Per pharmacy, continue heparin ggt, obtain daily aPTT and anti-Xa level, f/u plans for long-term anticoagulation -Echo results pending -Start po Lasix today for leg swelling  Chronic respiratory failure with hypoxia -No clinical sign of exacerbation.  Will continue on 5L oxygen  Morbid obesity -Continue heart healthy diet during hospitalization   Essential hypertension -BP stable, 119/90 this morning.  Continue lisinopril and monitor BP -Continue statin therapy  Hypothyroidism -Continue Synthroid  Bed  bound status -Reports using wheelchair  -Will need PT evaluation.   DVT prophylaxis: Heparin Code Status:  Full Family Communication:  No family at bedside  Disposition Plan:  Patient agreeable to SNF   Consultants:     Procedures: Antimicrobials:  Subjective: Patient appears nervous, alert, oriented. Denies fever/chill, dizziness, chest pain, cough, difficulty breathing, abdominal pain, no changes in bowel habits.  Reports constant left leg pain, swelling.   Objective: Vitals:   11/27/17 0900 11/27/17 1247 11/27/17 2027 11/28/17 0627  BP: 125/63 (!) 161/82 137/68 119/90  Pulse:  (!) 110 (!) 112 (!) 106  Resp: (!) 23 19 18 15   Temp:  98.9 F (37.2 C) 98.2 F (36.8 C) 98 F (36.7 C)  TempSrc:  Oral Oral Oral  SpO2:  94% 91% 92%  Weight:    (!) 152.1 kg (335 lb 5.1 oz)  Height:        Intake/Output Summary (Last 24 hours) at 11/28/2017 1011 Last data filed at 11/27/2017 1756 Gross per 24 hour  Intake 344.28 ml  Output -  Net 344.28 ml   Filed Weights   11/26/17 1904 11/27/17 0150 11/28/17 0627  Weight: (!) 158.8 kg (350 lb) (!) 145 kg (319 lb 10.7 oz) (!) 152.1 kg (335 lb 5.1 oz)    Examination:  General exam: Appears anxious, ill looking  E ENT: No icterus, oral mucosa moist. Respiratory system: Clear to auscultation. Respiratory effort normal. No wheezing or crackle Cardiovascular system: S1 & S2 heard, RRR.  Left lower leg edema Gastrointestinal system: Abdomen is nondistended, soft and nontender. Normal bowel sounds heard. Central nervous system: Alert and oriented. No focal neurological deficits. Musculoskeletal/Extremities: No joint deformities  Skin: No rashes, lesions or ulcers. Dry and warm to touch   Data Reviewed: I have personally reviewed following labs and imaging studies  CBC: Recent Labs  Lab 11/26/17 2147 11/27/17 0516 11/28/17 0905  WBC 9.7 8.9 9.4  NEUTROABS 6.6  --   --   HGB 13.1 12.9 12.9  HCT 40.3 39.9 40.0  MCV 93.3 94.5 94.8    PLT 396 379 027   Basic Metabolic Panel: Recent Labs  Lab 11/26/17 2147 11/27/17 0516  NA 133* 138  K 3.5 3.7  CL 99* 103  CO2 21* 25  GLUCOSE 150* 147*  BUN 7 6  CREATININE 0.74 0.86  CALCIUM 10.2 10.3   GFR: Estimated Creatinine Clearance: 87.3 mL/min (by C-G formula based on SCr of 0.86 mg/dL). Liver Function Tests: Recent Labs  Lab 11/26/17 2147  AST 14*  ALT 12*  ALKPHOS 70  BILITOT 0.5  PROT 6.8  ALBUMIN 3.1*   No results for input(s): LIPASE, AMYLASE in the last 168 hours. No results for input(s): AMMONIA in the last 168 hours. Coagulation Profile: Recent Labs  Lab 11/27/17 0005  INR 1.37   Cardiac Enzymes: No results for input(s): CKTOTAL, CKMB, CKMBINDEX, TROPONINI in the last 168 hours. BNP (last 3 results) No results for input(s): PROBNP in the last 8760 hours. HbA1C: No results for input(s): HGBA1C in the last 72 hours. CBG: No results for input(s): GLUCAP in the last 168 hours. Lipid Profile: No results for input(s): CHOL, HDL, LDLCALC, TRIG, CHOLHDL, LDLDIRECT in the last 72 hours. Thyroid Function Tests: Recent Labs    11/27/17 0516  TSH 5.420*   Anemia Panel: No results for input(s): VITAMINB12, FOLATE, FERRITIN, TIBC, IRON, RETICCTPCT in the last 72 hours. Sepsis Labs: No results for input(s): PROCALCITON, LATICACIDVEN in the last 168 hours.  No results found for this or any previous visit (from the past 240 hour(s)).       Radiology Studies: Ct Angio Chest Pe W And/or Wo Contrast  Result Date: 11/26/2017 CLINICAL DATA:  Patient was recently seen at [facility] 4 days ago for deep venous thrombosis of the left leg. Shortness of breath. EXAM: CT ANGIOGRAPHY CHEST WITH CONTRAST TECHNIQUE: Multidetector CT imaging of the chest was performed using the standard protocol during bolus administration of intravenous contrast. Multiplanar CT image reconstructions and MIPs were obtained to evaluate the vascular anatomy. CONTRAST:  146mL  ISOVUE-370 IOPAMIDOL (ISOVUE-370) INJECTION 76% COMPARISON:  06/19/2017 FINDINGS: Cardiovascular: Multiple filling defects demonstrated in the segmental and subsegmental pulmonary arteries to the upper lungs and lower lungs bilaterally. Examination is positive for acute deep venous thrombosis. Heart size is normal. RV to LV ratio is normal, suggesting no evidence of right heart strain. Normal caliber thoracic aorta. Scattered aortic calcifications. No pericardial effusions. Mediastinum/Nodes: No significant lymphadenopathy in the chest. Esophagus is decompressed. Scattered calcified lymph nodes in the mediastinum. Lungs/Pleura: Motion artifact limits evaluation. Patchy airspace infiltrates in both lungs may represent edema or multifocal pneumonia. Small peripheral lung infarcts could also be present although the pattern is more likely to represent a diffuse process. No pleural effusions. No pneumothorax. Airways are patent. Upper Abdomen: No acute process demonstrated in the visualized upper abdomen. Musculoskeletal: Degenerative changes in the spine. No destructive bone lesions. Review of the MIP images confirms the above findings. IMPRESSION: 1. Positive examination for multiple bilateral pulmonary emboli. No evidence of right heart strain. 2. Patchy airspace infiltrates in both lungs most likely to represent edema or multifocal pneumonia. Small peripheral infarcts less likely.  3. These results were called by telephone at the time of interpretation on 11/26/2017 at 11:25 pm to Dr. Orlie Dakin , who verbally acknowledged these results. Aortic Atherosclerosis (ICD10-I70.0). Electronically Signed   By: Lucienne Capers M.D.   On: 11/26/2017 23:27        Scheduled Meds: . buPROPion  450 mg Oral Daily  . ezetimibe-simvastatin  1 tablet Oral QHS  . fluticasone  2 spray Each Nare Daily  . furosemide  20 mg Oral Daily  . levothyroxine  50 mcg Oral QAC breakfast  . lisinopril  20 mg Oral Daily  . megestrol   80 mg Oral BID  . mupirocin ointment  1 application Nasal QHS  . pantoprazole  40 mg Oral Daily   Continuous Infusions: . heparin 1,650 Units/hr (11/28/17 0639)     LOS: 1 day    Jo-ku Elta [place]) Mervyn Skeeters, PA-S

## 2017-11-29 DIAGNOSIS — C541 Malignant neoplasm of endometrium: Secondary | ICD-10-CM

## 2017-11-29 LAB — HEPARIN LEVEL (UNFRACTIONATED): HEPARIN UNFRACTIONATED: 0.99 [IU]/mL — AB (ref 0.30–0.70)

## 2017-11-29 LAB — APTT: APTT: 88 s — AB (ref 24–36)

## 2017-11-29 MED ORDER — ENOXAPARIN SODIUM 150 MG/ML ~~LOC~~ SOLN
140.0000 mg | Freq: Two times a day (BID) | SUBCUTANEOUS | Status: DC
  Administered 2017-11-29 – 2017-11-30 (×3): 140 mg via SUBCUTANEOUS
  Filled 2017-11-29 (×3): qty 0.93

## 2017-11-29 NOTE — Progress Notes (Signed)
PROGRESS NOTE    Theresa Blake  URK:[id number] DOB: [birth year] DOA: 11/26/2017 PCP: Tracie Harrier, MD   Brief Narrative:  Patient is a 77 y.o. y/o female with history of recent admission for DVT in left lower extremity, pulmonary embolism, morbidly obese, bed bound, oxygen dependent on 3L at home, uterine cancer with bleeding , presents to the ED with left leg pain. She was last admitted to [facility] on 1/21 for extensive pain with movement on left lower extremities, suprarenal filter palcement was considered until the vascular surgeon found the thrombus in IVC was too extensive for the patient to benefit from the filter, therefore surgery was terminated and patient was sent home with Eliquis.  On initial physical examination in ED, she was afebrile, tachycardic, lab finding within normal limits.  CT angiogram shows multiple bilateral pulmonary emboli and patchy airspace infiltrates likely to represent edema or multifocal pneumonia. She was admitted for bilateral pulmonary emboli in the setting of DVT in left lower extremity.   Assessment & Plan:   Bilateral pulmonary emboli in the setting of DVT in LLE -Left leg remains swelling and mild erythema. Denies shortness of breathe or chest pain, stable -aPTT 88 and is therapeutic. Per pharmacy, discontinued heparin gtt and started Lovenox subcutaneous at 10:00am.  Continue to monitor CBC and BMET -ECHO shows LVEF 60% to 65%, normal cavity size, mild LVH.   Chronic respiratory failure with hypoxia -No clinical sign of exacerbation currently. Patient SpO2 100% on 3L oxygen via nasal cannula  Morbid obesity -Continue heart healthy diet during hospitalization   Essential hypertension -BP stable, 118/97 this morning.  Continue lisinopril and monitor BP -Continue statin therapy  Hypothyroidism -Continue Synthroid  Uterine cancer with intermittent vaginal bleeding -No sign of active bleeding, recommend outpatient follow-up.  Bed  bound status -Reports using wheelchair. Bed was switched to air mattress -Continue follow-up with PT/OT -Waiting for discharge to SNF if bed available and wants her son to be involved with the process  DVT prophylaxis: Switched to Lovonox Code Status:  Full Family Communication:  No family at bedside  Disposition Plan:  Patient agreeable to SNF   Consultants:     Procedures: Antimicrobials:  Subjective: Patient appears calm, alert, oriented. Denies fever/chill, dizziness, chest pain, cough, difficulty breathing, abdominal pain.  No new complaints.  Objective: Vitals:   11/28/17 0627 11/28/17 2012 11/29/17 0343 11/29/17 0901  BP: 119/90 (!) 156/76 120/63 (!) 118/97  Pulse: (!) 106 (!) 107 (!) 106   Resp: 15 18 20    Temp: 98 F (36.7 C) 98.2 F (36.8 C) 97.7 F (36.5 C)   TempSrc: Oral Oral Oral   SpO2: 92% 95% 100%   Weight: (!) 152.1 kg (335 lb 5.1 oz)  (!) 143.3 kg (316 lb)   Height:       No intake or output data in the 24 hours ending 11/29/17 1054 Filed Weights   11/27/17 0150 11/28/17 0627 11/29/17 0343  Weight: (!) 145 kg (319 lb 10.7 oz) (!) 152.1 kg (335 lb 5.1 oz) (!) 143.3 kg (316 lb)    Examination:  General exam: Appears deconditioned, calm  E ENT: No icterus, oral mucosa moist. Respiratory system: Clear to auscultation. Respiratory effort normal. No wheezing or crackle Cardiovascular system: S1 & S2 heard, RRR.  Swelling at LLE Gastrointestinal system: Abdomen is nondistended, soft and nontender. Normal bowel sounds heard. Central nervous system: Alert and oriented. No focal neurological deficits. Musculoskeletal/Extremities:  No joint deformities  Skin: No rashes, lesions or  ulcers. Dry and warm to touch. Mild erythema on LLE  Data Reviewed: I have personally reviewed following labs and imaging studies  CBC: Recent Labs  Lab 11/26/17 2147 11/27/17 0516 11/28/17 0905  WBC 9.7 8.9 9.4  NEUTROABS 6.6  --   --   HGB 13.1 12.9 12.9  HCT 40.3  39.9 40.0  MCV 93.3 94.5 94.8  PLT 396 379 563   Basic Metabolic Panel: Recent Labs  Lab 11/26/17 2147 11/27/17 0516  NA 133* 138  K 3.5 3.7  CL 99* 103  CO2 21* 25  GLUCOSE 150* 147*  BUN 7 6  CREATININE 0.74 0.86  CALCIUM 10.2 10.3   GFR: Estimated Creatinine Clearance: 84.1 mL/min (by C-G formula based on SCr of 0.86 mg/dL). Liver Function Tests: Recent Labs  Lab 11/26/17 2147  AST 14*  ALT 12*  ALKPHOS 70  BILITOT 0.5  PROT 6.8  ALBUMIN 3.1*   No results for input(s): LIPASE, AMYLASE in the last 168 hours. No results for input(s): AMMONIA in the last 168 hours. Coagulation Profile: Recent Labs  Lab 11/27/17 0005  INR 1.37   Cardiac Enzymes: No results for input(s): CKTOTAL, CKMB, CKMBINDEX, TROPONINI in the last 168 hours. BNP (last 3 results) No results for input(s): PROBNP in the last 8760 hours. HbA1C: No results for input(s): HGBA1C in the last 72 hours. CBG: No results for input(s): GLUCAP in the last 168 hours. Lipid Profile: No results for input(s): CHOL, HDL, LDLCALC, TRIG, CHOLHDL, LDLDIRECT in the last 72 hours. Thyroid Function Tests: Recent Labs    11/27/17 0516  TSH 5.420*   Anemia Panel: No results for input(s): VITAMINB12, FOLATE, FERRITIN, TIBC, IRON, RETICCTPCT in the last 72 hours. Sepsis Labs: No results for input(s): PROCALCITON, LATICACIDVEN in the last 168 hours.  No results found for this or any previous visit (from the past 240 hour(s)).       Radiology Studies: No results found.      Scheduled Meds: . buPROPion  450 mg Oral Daily  . enoxaparin (LOVENOX) injection  140 mg Subcutaneous Q12H  . ezetimibe-simvastatin  1 tablet Oral QHS  . fluticasone  2 spray Each Nare Daily  . furosemide  20 mg Oral Daily  . levothyroxine  50 mcg Oral QAC breakfast  . lisinopril  20 mg Oral Daily  . megestrol  80 mg Oral BID  . mupirocin ointment  1 application Nasal QHS  . pantoprazole  40 mg Oral Daily   Continuous  Infusions:   LOS: 2 days    Jo-ku Elta [place]) Mervyn Skeeters, PA-S

## 2017-11-29 NOTE — NC FL2 (Signed)
[place] LEVEL OF CARE SCREENING TOOL     IDENTIFICATION  Patient Name: Theresa Blake Birthdate: October 18, 1941 Sex: female Admission Date (Current Location): 11/26/2017  [facility] and [place] Number:  Herbalist and Address:  [facility], [place] [address] [place], [place] [address]      Provider Number: [id number]  Attending Physician Name and Address:  Rosita Fire, MD  Relative Name and Phone Number:  Curtisha Bendix, [phone]    Current Level of Care: Hospital Recommended Level of Care: [place] Prior Approval Number:    Date Approved/Denied:   PASRR Number: [id number]  Discharge Plan: SNF    Current Diagnoses: Patient Active Problem List   Diagnosis Date Noted  . Bilateral pulmonary embolism ([place]) 11/27/2017  . Uterine bleeding 11/27/2017  . Endometrial cancer ([place]) 11/27/2017  . Left leg DVT ([place]) 11/27/2017  . Chronic respiratory failure with hypoxia ([place]) 11/27/2017  . DVT (deep venous thrombosis) ([place]) 11/14/2017  . Weakness   . Palliative care by specialist   . Vaginal bleeding 06/19/2017  . History of endometrial cancer   . Acute respiratory failure with hypoxia ([place])   . Multiple rib fractures 06/30/2016  . Broken ribs 06/30/2016  . Goals of care, counseling/discussion 03/23/2014  . Hypoxia 04/29/2013  . Spinal stenosis of lumbar region 04/12/2012  . Neck strain 11/05/2011  . Wound of right leg 11/05/2011  . Cellulitis and abscess of leg 10/13/2011  . Sinusitis 10/13/2011  . Vertigo 10/13/2011  . Other screening mammogram 03/15/2011  . BACK PAIN, LUMBAR 12/22/2010  . HIP PAIN, RIGHT 12/21/2010  . ALLERGIC RHINITIS 07/22/2010  . Morbid obesity due to excess calories ([place]) 02/27/2010  . FUNGAL DERMATITIS 08/15/2009  . Hypothyroidism 07/13/2007  . Diabetes mellitus type 2 in obese ([place]) 03/24/2007  . Hyperlipidemia 03/24/2007  . PANIC DISORDER 03/24/2007  . Depression  03/24/2007  . Essential hypertension 03/24/2007  . FOLLICULITIS 02/58/5277  . BACK PAIN 03/24/2007  . EDEMA 03/24/2007  . PULMONARY EMBOLISM, HX OF 03/24/2007    Orientation RESPIRATION BLADDER Height & Weight     Self, Time, Situation, Place  O2(3l) Incontinent Weight: (!) 316 lb (143.3 kg) Height:  5\' 7"  (170.2 cm)  BEHAVIORAL SYMPTOMS/MOOD NEUROLOGICAL BOWEL NUTRITION STATUS      Incontinent Diet(dsy3 )  AMBULATORY STATUS COMMUNICATION OF NEEDS Skin   Extensive Assist Verbally                         Personal Care Assistance Level of Assistance  Bathing, Feeding, Dressing Bathing Assistance: Maximum assistance Feeding assistance: Limited assistance Dressing Assistance: Maximum assistance     Functional Limitations Info  Sight, Hearing, Speech Sight Info: Adequate Hearing Info: Adequate Speech Info: Impaired    SPECIAL CARE FACTORS FREQUENCY  PT (By licensed PT), OT (By licensed OT)     PT Frequency: 5x wk OT Frequency: 5x wk            Contractures Contractures Info: Not present    Additional Factors Info  Code Status, Allergies Code Status Info: DNR Allergies Info: ANESTHETICS, HALOGENATED, LEVAQUIN LEVOFLOXACIN IN D5W, LAMOTRIGINE, TRAMADOL           Current Medications (11/29/2017):  This is the current hospital active medication list Current Facility-Administered Medications  Medication Dose Route Frequency Provider Last Rate Last Dose  . acetaminophen (TYLENOL) tablet 650 mg  650 mg Oral Q6H PRN Norval Morton, MD  Or  . acetaminophen (TYLENOL) suppository 650 mg  650 mg Rectal Q6H PRN Fuller Plan A, MD      . buPROPion (WELLBUTRIN XL) 24 hr tablet 450 mg  450 mg Oral Daily Fuller Plan A, MD   450 mg at 11/29/17 0904  . clonazePAM (KLONOPIN) tablet 0.5 mg  0.5 mg Oral Daily PRN Fuller Plan A, MD   0.5 mg at 11/28/17 2155  . diphenhydrAMINE (BENADRYL) capsule 25 mg  25 mg Oral Q6H PRN Smith, Rondell A, MD      . enoxaparin  (LOVENOX) injection 140 mg  140 mg Subcutaneous Q12H Leodis Sias, RPH   140 mg at 11/29/17 1016  . ezetimibe-simvastatin (VYTORIN) 10-20 MG per tablet 1 tablet  1 tablet Oral QHS Fuller Plan A, MD   1 tablet at 11/28/17 2156  . fluticasone (FLONASE) 50 MCG/ACT nasal spray 2 spray  2 spray Each Nare Daily Smith, Rondell A, MD   2 spray at 11/29/17 1016  . furosemide (LASIX) tablet 20 mg  20 mg Oral Daily Fuller Plan A, MD   20 mg at 11/29/17 0904  . ipratropium-albuterol (DUONEB) 0.5-2.5 (3) MG/3ML nebulizer solution 3 mL  3 mL Nebulization Q4H PRN Smith, Rondell A, MD      . levothyroxine (SYNTHROID, LEVOTHROID) tablet 50 mcg  50 mcg Oral QAC breakfast Fuller Plan A, MD   50 mcg at 11/29/17 0901  . lisinopril (PRINIVIL,ZESTRIL) tablet 20 mg  20 mg Oral Daily Tamala Julian, Rondell A, MD   20 mg at 11/29/17 0902  . megestrol (MEGACE) tablet 80 mg  80 mg Oral BID Fuller Plan A, MD   80 mg at 11/29/17 0904  . mupirocin ointment (BACTROBAN) 2 % 1 application  1 application Nasal QHS Norval Morton, MD   1 application at 07/37/10 2156  . ondansetron (ZOFRAN) tablet 4 mg  4 mg Oral Q6H PRN Fuller Plan A, MD       Or  . ondansetron (ZOFRAN) injection 4 mg  4 mg Intravenous Q6H PRN Smith, Rondell A, MD      . pantoprazole (PROTONIX) EC tablet 40 mg  40 mg Oral Daily Fuller Plan A, MD   40 mg at 11/29/17 0902     Discharge Medications: Please see discharge summary for a list of discharge medications.  Relevant Imaging Results:  Relevant Lab Results:   Additional Information SS# [id number] may need air mattress   Wende Neighbors, LCSW

## 2017-11-29 NOTE — Clinical Social Work Note (Signed)
Clinical Social Work Assessment  Patient Details  Name: Theresa Blake MRN: [id number] Date of Birth: [birth year]  Date of referral:  11/29/17               Reason for consult:  Discharge Planning, Facility Placement                Permission sought to share information with:  Family Supports Permission granted to share information::  Yes, Verbal Permission Granted  Name::     Analea Muller  Agency::  snf  Relationship::  son  Contact Information:  [phone]  Housing/Transportation Living arrangements for the past 2 months:  [place] of Information:  Patient, Adult Children Patient Interpreter Needed:  None Criminal Activity/Legal Involvement Pertinent to Current Situation/Hospitalization:    Significant Relationships:  Adult Children Lives with:  Adult Children Do you feel safe going back to the place where you live?    Need for family participation in patient care:  Yes (Comment)  Care giving concerns: patient's sons at bedside  Social Worker assessment / plan:  CSW met patient and family at bedside to discuss disposition needs. Patient stated she lives with younger son [name] and he is able to give her 24/hr care since [name] does not work. Patient and family agreeable for patient to discharge to facility but stated they "want a nice place". CSW gave family a list of facilities that have made a bed offer and encouraged family to look over list and let CSW when they have made a decision. Patient and family aware of pending discharge.  Employment status:  Retired Forensic scientist:  [employer] PT Recommendations:  [place] / Referral to community resources:  [place]  Patient/Family's Response to care: Family supportive of patient and her needs  Patient/Family's Understanding of and Emotional Response to Diagnosis, Current Treatment, and Prognosis:  Patent seems to be very hesitant about going to a SNF stating she  doesn't "want to go to a hell hole". CSW explained the process of SNF in hopes patients anxiety is reduced.   Emotional Assessment Appearance:  Appears stated age Attitude/Demeanor/Rapport:  Other Affect (typically observed):  Appropriate, Apprehensive Orientation:  Oriented to Self, Oriented to Situation, Oriented to  Time, Oriented to Place Alcohol / Substance use:  Not Applicable Psych involvement (Current and /or in the community):  No (Comment)  Discharge Needs  Concerns to be addressed:  No discharge needs identified Readmission within the last 30 days:  No Current discharge risk:  None Barriers to Discharge:  No Barriers Identified   Wende Neighbors, LCSW 11/29/2017, 2:09 PM

## 2017-11-29 NOTE — Progress Notes (Signed)
ANTICOAGULATION CONSULT NOTE - Follow Up Consult  Pharmacy Consult for Heparin  Indication: pulmonary embolus, recent DVT on 11/14/17  Allergies  Allergen Reactions  . Anesthetics, Halogenated Shortness Of Breath  . Levaquin [Levofloxacin In D5w] Itching  . Lamotrigine Itching  . Tramadol Rash    Patient Measurements: Height: 5\' 7"  (170.2 cm) Weight: (!) 316 lb (143.3 kg) IBW/kg (Calculated) : 61.6  Heparin dosing weight: 99.5 kg  Vital Signs: Temp: 97.7 F (36.5 C) (02/05 0343) Temp Source: Oral (02/05 0343) BP: 120/63 (02/05 0343) Pulse Rate: 106 (02/05 0343)  Labs: Recent Labs    11/26/17 2147  11/27/17 0005 11/27/17 0516 11/27/17 0933 11/27/17 1756 11/28/17 0905 11/29/17 0342  HGB 13.1  --   --  12.9  --   --  12.9  --   HCT 40.3  --   --  39.9  --   --  40.0  --   PLT 396  --   --  379  --   --  385  --   APTT  --    < > 41*  --  96* 94*  --  88*  LABPROT  --   --  16.7*  --   --   --   --   --   INR  --   --  1.37  --   --   --   --   --   HEPARINUNFRC  --   --  1.96*  --  1.70*  --   --  0.99*  CREATININE 0.74  --   --  0.86  --   --   --   --    < > = values in this interval not displayed.    Estimated Creatinine Clearance: 84.1 mL/min (by C-G formula based on SCr of 0.86 mg/dL).  Medications: Heparin @ 1650 units/hr  Assessment: 77 y/o F On apixaban in the past for hx of PE, she was admitted to [facility] on 1/21 with new onset DVT>>treated with heparin>>then transitioned back to apixaban. Has hx of medication non-compliance and it appears she may have never picked up the new prescription for the higher dose of apixaban, ?taking the 2.5 mg dose since DC from [place]. She presents to the [facility] on 2/2 with leg pain, found to have new onset bilateral PE via CT Angio, and has been on IV heparin for anticoagulation.   aPTT 88 therapeutic, will switch to lovenox and pending discharge to SNF. CBC stable, No bleeding noted per chart.  Goal of Therapy:  Anti-Xa  level 0.6-1 units/ml 4hrs after LMWH dose given Monitor platelets by anticoagulation protocol: Yes   Plan:  Stop heparin infusion now Lovenox 140 mg sq Q 12 hrs first dose today at 1000 (~1hr after heparin is off) Monitor CBC and BMET   Maryanna Shape, PharmD, BCPS  Clinical Pharmacist  Pager: [phone]  11/29/2017,8:40 AM   11/27/2017, 7:32 PM

## 2017-11-30 MED ORDER — METOPROLOL TARTRATE 12.5 MG HALF TABLET
12.5000 mg | ORAL_TABLET | Freq: Two times a day (BID) | ORAL | Status: DC
  Administered 2017-11-30: 12.5 mg via ORAL
  Filled 2017-11-30: qty 1

## 2017-11-30 MED ORDER — CLONAZEPAM 0.5 MG PO TABS: 0.5000 mg | ORAL_TABLET | Freq: Two times a day (BID) | ORAL | 0 refills | Status: DC | PRN

## 2017-11-30 MED ORDER — LISINOPRIL 10 MG PO TABS
10.0000 mg | ORAL_TABLET | Freq: Every day | ORAL | Status: DC
  Administered 2017-11-30: 10 mg via ORAL
  Filled 2017-11-30: qty 1

## 2017-11-30 MED ORDER — METOPROLOL TARTRATE 25 MG PO TABS: 12.5000 mg | ORAL_TABLET | Freq: Two times a day (BID) | ORAL | 0 refills | Status: DC

## 2017-11-30 MED ORDER — LISINOPRIL 10 MG PO TABS: 10.0000 mg | ORAL_TABLET | Freq: Every day | ORAL | 0 refills | Status: DC

## 2017-11-30 MED ORDER — ENOXAPARIN SODIUM 150 MG/ML ~~LOC~~ SOLN: 140.0000 mg | Freq: Two times a day (BID) | SUBCUTANEOUS | Status: DC

## 2017-11-30 NOTE — Discharge Summary (Signed)
Physician Discharge Summary  Theresa Blake UYQ:[id number] DOB: 18-Sep-1941 DOA: 11/26/2017  PCP: Tracie Harrier, MD  Admit date: 11/26/2017 Discharge date: 11/30/2017  Admitted From:home Disposition:SNF  Recommendations for Outpatient Follow-up:  1. Follow up with PCP in 1-2 weeks 2. Please obtain BMP/CBC in one week 3. Please follow-up with her oncologist, vascular surgeon as outpatient.  Home Health: None Equipment/Devices: None Discharge Condition: Stable  CODE STATUS:full code Diet recommendation: Heart healthy  Brief/Interim Summary: 77 year old morbidly obese female with history of DVT, recently admitted at [facility] when seen by vascular team felt not to be a candidate for thrombolysis or IVC filter, discharged home with home care and Eliquis. Patient presented with sitting left leg pain and requiring medication to be refilled. Patient was found to have bilateral pulmonary emboli in the setting of left lower extremity extensive DVT.  # Bilateral pulmonary embolism without heart history in CT chest: In the setting of extensive left lower extremity DVT. Already seen by vascular surgery. As per internal medicine progress note from 2/3 it was discussed with Dr. Donnetta Hutching who did not recommend any further intervention. Switch from IV heparin to Lovenox sq. Echo with no RV strain. Plan to dc to SNF with sq lovenox with outpatient follow-up.  Patient with no chest pain or shortness of breath.  #Acute on chronic respiratory failure with hypoxia: Continue oxygen 2-3 L as needed.  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 Seen by vascular surgeon at [facility], it was felt that her clot was too extensive for thrombolysis to be successful. She was not felt to be a candidate for IVC. Continue anticoagulation.  #Uterine cancer with intermittent vaginal bleeding: Patient follows up at [facility]. Recommend outpatient  follow-up. No sign of active bleeding.  #Morbid obesity #Bedbound status: Continue supportive care, PT OT therapy and discharge to SNF. #Anxiety: Patient is on Klonopin as needed.  She looked anxious this morning.  Continue ordered on discharge.  #Hypertension: Lower the dose of lisinopril and added metoprolol to manage tachycardia.  Continue to monitor heart rate and blood pressure.  Discharge Diagnoses:  Active Problems:   Hypothyroidism   Morbid obesity due to excess calories (HCC)   Essential hypertension   Bilateral pulmonary embolism (HCC)   Uterine bleeding   Endometrial cancer (HCC)   Left leg DVT (HCC)   Chronic respiratory failure with hypoxia [facility])    Discharge Instructions  Discharge Instructions    Call MD for:  difficulty breathing, headache or visual disturbances   Complete by:  As directed    Call MD for:  extreme fatigue   Complete by:  As directed    Call MD for:  hives   Complete by:  As directed    Call MD for:  persistant dizziness or light-headedness   Complete by:  As directed    Call MD for:  persistant nausea and vomiting   Complete by:  As directed    Call MD for:  severe uncontrolled pain   Complete by:  As directed    Call MD for:  temperature >100.4   Complete by:  As directed    Diet - low sodium heart healthy   Complete by:  As directed    Increase activity slowly   Complete by:  As directed      Allergies as of 11/30/2017      Reactions   Anesthetics, Halogenated Shortness Of Breath   Levaquin [levofloxacin In  D5w] Itching   Lamotrigine Itching   Tramadol Rash      Medication List    STOP taking these medications   apixaban 5 MG Tabs tablet Commonly known as:  ELIQUIS   ELIQUIS 2.5 MG Tabs tablet Generic drug:  apixaban     TAKE these medications   ACCU-CHEK AVIVA PLUS test strip Generic drug:  glucose blood 1 each by Other route as needed for other. Use as instructed   ACCU-CHEK AVIVA PLUS w/Device Kit by Does not  apply route.   acetaminophen 500 MG tablet Commonly known as:  TYLENOL Take 1,000 mg by mouth every 6 (six) hours as needed for mild pain, moderate pain, fever or headache.   buPROPion 150 MG 24 hr tablet Commonly known as:  WELLBUTRIN XL Take 450 mg by mouth daily.   clonazePAM 0.5 MG tablet Commonly known as:  KLONOPIN Take 1 tablet (0.5 mg total) by mouth 2 (two) times daily as needed for anxiety. What changed:    when to take this  reasons to take this   diphenhydrAMINE 25 MG tablet Commonly known as:  BENADRYL Take 25 mg by mouth every 6 (six) hours as needed for allergies.   enoxaparin 150 MG/ML injection Commonly known as:  LOVENOX Inject 0.93 mLs (140 mg total) into the skin every 12 (twelve) hours.   ezetimibe-simvastatin 10-20 MG tablet Commonly known as:  VYTORIN Take 1 tablet by mouth at bedtime.   fluticasone 50 MCG/ACT nasal spray Commonly known as:  FLONASE Place 2 sprays into both nostrils daily.   furosemide 20 MG tablet Commonly known as:  LASIX Take 20 mg by mouth daily.   levothyroxine 50 MCG tablet Commonly known as:  SYNTHROID, LEVOTHROID Take 1 tablet (50 mcg total) by mouth daily before breakfast.   lisinopril 10 MG tablet Commonly known as:  PRINIVIL,ZESTRIL Take 1 tablet (10 mg total) by mouth daily. What changed:    medication strength  how much to take   megestrol 40 MG tablet Commonly known as:  MEGACE Take 2 tablets (80 mg total) by mouth 2 (two) times daily.   metoprolol tartrate 25 MG tablet Commonly known as:  LOPRESSOR Take 0.5 tablets (12.5 mg total) by mouth 2 (two) times daily.   multivitamin with minerals Tabs tablet Take 1 tablet by mouth daily.   mupirocin ointment 2 % Commonly known as:  BACTROBAN Place 1 application into the nose at bedtime.   omeprazole 20 MG capsule Commonly known as:  PRILOSEC TAKE ONE CAPSULE BY MOUTH EVERY DAY *NEEDS OFFICE VISIT* What changed:  See the new instructions.    OXYGEN Inhale 2 L into the lungs continuous.   venlafaxine XR 150 MG 24 hr capsule Commonly known as:  EFFEXOR-XR Take 300 mg by mouth daily.      Follow-up Information    Tracie Harrier, MD. Schedule an appointment as soon as possible for a visit in 1 week(s).   Specialty:  Internal Medicine Contact information: [place] [address] [phone]          Allergies  Allergen Reactions  . Anesthetics, Halogenated Shortness Of Breath  . Levaquin [Levofloxacin In D5w] Itching  . Lamotrigine Itching  . Tramadol Rash    Consultations: None  Procedures/Studies: Echo  Subjective: Seen and examined at bedside.  Denies headache, dizziness, nausea vomiting chest pain shortness of breath.  Looks anxious.  Discharge Exam: Vitals:   11/30/17 0402 11/30/17 0410  BP: 137/88 Marland Kitchen)  144/68  Pulse: (!) 110   Resp:  20  Temp: 98.4 F (36.9 C)   SpO2: 96% 96%   Vitals:   11/29/17 2040 11/30/17 0331 11/30/17 0402 11/30/17 0410  BP: 138/70  137/88 (!) 144/68  Pulse: (!) 110  (!) 110   Resp: 14   20  Temp: 98.3 F (36.8 C)  98.4 F (36.9 C)   TempSrc: Oral  Oral   SpO2: 96%  96% 96%  Weight:  (!) 142.4 kg (314 lb)    Height:        General: Pt is alert, awake, not in acute distress, looking anxious Cardiovascular: Regular mild tachycardia, S1/S2 +, no rubs, no gallops Respiratory: CTA bilaterally, no wheezing, no rhonchi Abdominal: Soft, NT, ND, bowel sounds + Extremities: Left leg nonpitting edema which is unchanged    The results of significant diagnostics from this hospitalization (including imaging, microbiology, ancillary and laboratory) are listed below for reference.     Microbiology: No results found for this or any previous visit (from the past 240 hour(s)).   Labs: BNP (last 3 results) Recent Labs    06/19/17 2351 11/27/17 0005  BNP 39.0 67.3   Basic Metabolic Panel: Recent Labs  Lab 11/26/17 2147  11/27/17 0516  NA 133* 138  K 3.5 3.7  CL 99* 103  CO2 21* 25  GLUCOSE 150* 147*  BUN 7 6  CREATININE 0.74 0.86  CALCIUM 10.2 10.3   Liver Function Tests: Recent Labs  Lab 11/26/17 2147  AST 14*  ALT 12*  ALKPHOS 70  BILITOT 0.5  PROT 6.8  ALBUMIN 3.1*   No results for input(s): LIPASE, AMYLASE in the last 168 hours. No results for input(s): AMMONIA in the last 168 hours. CBC: Recent Labs  Lab 11/26/17 2147 11/27/17 0516 11/28/17 0905  WBC 9.7 8.9 9.4  NEUTROABS 6.6  --   --   HGB 13.1 12.9 12.9  HCT 40.3 39.9 40.0  MCV 93.3 94.5 94.8  PLT 396 379 385   Cardiac Enzymes: No results for input(s): CKTOTAL, CKMB, CKMBINDEX, TROPONINI in the last 168 hours. BNP: Invalid input(s): POCBNP CBG: No results for input(s): GLUCAP in the last 168 hours. D-Dimer No results for input(s): DDIMER in the last 72 hours. Hgb A1c No results for input(s): HGBA1C in the last 72 hours. Lipid Profile No results for input(s): CHOL, HDL, LDLCALC, TRIG, CHOLHDL, LDLDIRECT in the last 72 hours. Thyroid function studies No results for input(s): TSH, T4TOTAL, T3FREE, THYROIDAB in the last 72 hours.  Invalid input(s): FREET3 Anemia work up No results for input(s): VITAMINB12, FOLATE, FERRITIN, TIBC, IRON, RETICCTPCT in the last 72 hours. Urinalysis    Component Value Date/Time   COLORURINE YELLOW 04/08/2012 1622   APPEARANCEUR CLEAR 04/08/2012 1622   LABSPEC 1.016 04/08/2012 1622   PHURINE 8.0 04/08/2012 1622   GLUCOSEU NEGATIVE 04/08/2012 1622   HGBUR NEGATIVE 04/08/2012 1622   HGBUR negative 02/10/2010 1135   BILIRUBINUR neg 06/11/2013 1510   KETONESUR NEGATIVE 04/08/2012 1622   PROTEINUR neg 06/11/2013 1510   PROTEINUR NEGATIVE 04/08/2012 1622   UROBILINOGEN 0.2 06/11/2013 1510   UROBILINOGEN 1.0 04/08/2012 1622   NITRITE pos 06/11/2013 1510   NITRITE NEGATIVE 04/08/2012 1622   LEUKOCYTESUR moderate (2+) 06/11/2013 1510   Sepsis Labs Invalid input(s): PROCALCITONIN,   WBC,  LACTICIDVEN Microbiology No results found for this or any previous visit (from the past 240 hour(s)).   Time coordinating discharge: 30 minutes  SIGNED:   Rosita Fire,  MD  [facility] 11/30/2017, 9:00 AM  If 7PM-7AM, please contact night-coverage [email or web address] Password [email or web address]

## 2017-11-30 NOTE — Clinical Social Work Placement (Signed)
   CLINICAL SOCIAL WORK PLACEMENT  NOTE  Date:  11/30/2017  Patient Details  Name: Theresa Blake MRN: [id number] Date of Birth: 1941/10/25  Clinical Social Work is seeking post-discharge placement for this patient at the [place] level of care (*CSW will initial, date and re-position this form in  chart as items are completed):  Yes   Patient/family provided with [facility] Work Department's list of facilities offering this level of care within the geographic area requested by the patient (or if unable, by the patient's family).  Yes   Patient/family informed of their freedom to choose among providers that offer the needed level of care, that participate in Medicare, Medicaid or managed care program needed by the patient, have an available bed and are willing to accept the patient.  Yes   Patient/family informed of [facility]'s ownership interest in [facility] and [facility], as well as of the fact that they are under no obligation to receive care at these facilities.  PASRR submitted to EDS on       PASRR number received on       Existing PASRR number confirmed on 11/30/17     FL2 transmitted to all facilities in geographic area requested by pt/family on 11/30/17     FL2 transmitted to all facilities within larger geographic area on       Patient informed that his/her managed care company has contracts with or will negotiate with certain facilities, including the following:            Patient/family informed of bed offers received.  Patient chooses bed at [facility]     Physician recommends and patient chooses bed at      Patient to be transferred to [facility] on  .  Patient to be transferred to facility by ptar     Patient family notified on 11/30/17 of transfer.  Name of family member notified:  Jamice Carreno is aware of dishcarge today     PHYSICIAN       Additional Comment:     _______________________________________________ Wende Neighbors, LCSW 11/30/2017, 10:56 AM

## 2017-11-30 NOTE — Progress Notes (Signed)
Order received to discharge patient.  PIV access removed.  Telemetry monitor removed and CCMD notified.  Report called to RN at [facility].  Patient ready for transport.

## 2017-11-30 NOTE — Care Management Note (Signed)
Case Management Note Marvetta Gibbons RN, BSN Unit 4E-Case Manager [phone]   Patient Details  Name: Theresa Blake MRN: [id number] Date of Birth: [birth year]  Subjective/Objective:   Pt admitted with Bil. PE                 Action/Plan: PTA pt lived at home with son, was active with [facility] for HHRN/PT, recommendation for SNF- CSW consulted for placement needs.   Expected Discharge Date:  11/30/17               Expected Discharge Plan:  Skilled Nursing Facility  In-House Referral:  Clinical Social Work  Discharge planning Services  CM Consult  Post Acute Care Choice:  Home Health, Resumption of Svcs/PTA Provider Choice offered to:  Patient, Adult Children  DME Arranged:    DME Agency:     HH Arranged:    [place] Agency:  [place]  Status of Service:  Completed, signed off  If discussed at [place] of Stay Meetings, dates discussed:    Discharge Disposition: skilled facility   Additional Comments:  11/30/17- 1330- Chevon Fomby RN, CM- pt forl d/c today to SNF- CSW following for transition needs.   Dawayne Patricia, RN 11/30/2017, 1:28 PM

## 2017-11-30 NOTE — Progress Notes (Signed)
Clinical Social Worker facilitated patient discharge including contacting patient family and facility to confirm patient discharge plans.  Clinical information faxed to facility and family agreeable with plan.  CSW arranged ambulance transport via [facility] to [place].  RN to call [phone] (pt will go in room 52) report prior to discharge.  Clinical Social Worker will sign off for now as social work intervention is no longer needed. Please consult [place] again if new need arises.  Rhea Pink, MSW, [place]

## 2018-06-02 ENCOUNTER — Telehealth: Payer: Self-pay | Admitting: Obstetrics and Gynecology

## 2018-06-02 NOTE — Telephone Encounter (Signed)
Spoke with the patient's son about making an appointment for [facility].  He states she is bed bound and needs special transportation to our facility, which will cost them $1100 one way.  Theresa Blake stated he will discuss the situation with his mother and contact our office if they are able to get her here for an appointment.  Theresa Blake was informed that she will have to have someone with her to help her on the exam table or she will need to stay on the gurney to be seen.  He voiced understanding.  *NO FURTHER ACTION REQUIRED AT THIS TIME*

## 2018-06-15 ENCOUNTER — Encounter: Payer: Self-pay | Admitting: Obstetrics and Gynecology

## 2018-06-22 ENCOUNTER — Other Ambulatory Visit: Payer: Self-pay | Admitting: Obstetrics and Gynecology

## 2018-06-22 ENCOUNTER — Ambulatory Visit: Payer: Medicare Other

## 2018-06-22 ENCOUNTER — Encounter: Payer: Medicare Other | Admitting: Obstetrics and Gynecology

## 2018-06-22 DIAGNOSIS — N950 Postmenopausal bleeding: Secondary | ICD-10-CM

## 2018-06-22 DIAGNOSIS — R102 Pelvic and perineal pain: Secondary | ICD-10-CM

## 2018-06-22 DIAGNOSIS — M545 Low back pain: Secondary | ICD-10-CM

## 2018-06-23 ENCOUNTER — Telehealth: Payer: Self-pay

## 2018-06-23 NOTE — Telephone Encounter (Signed)
Pt referral placed by [facility] NP for aub. Review of pts records shows a CT 11/18/2017 that states she has known endometrial cancer.  MAD aware. Pt needs to be seen by gyn oncology. Informed pt when she came in for her visit. Advised I would let [facility] aware so they could refer her. Sherrlyn Hock with [facility] informed.

## 2018-07-03 ENCOUNTER — Other Ambulatory Visit (HOSPITAL_COMMUNITY): Payer: Medicare Other

## 2018-07-03 ENCOUNTER — Encounter (HOSPITAL_COMMUNITY): Payer: Self-pay | Admitting: Emergency Medicine

## 2018-07-03 ENCOUNTER — Emergency Department (HOSPITAL_COMMUNITY): Payer: Medicare Other

## 2018-07-03 ENCOUNTER — Other Ambulatory Visit: Payer: Self-pay

## 2018-07-03 ENCOUNTER — Inpatient Hospital Stay (HOSPITAL_COMMUNITY)
Admission: EM | Admit: 2018-07-03 | Discharge: 2018-07-08 | DRG: 291 | Disposition: A | Discharge status: Home Health | Payer: Medicare Other | Attending: Internal Medicine | Admitting: Internal Medicine

## 2018-07-03 DIAGNOSIS — Z23 Encounter for immunization: Secondary | ICD-10-CM | POA: Diagnosis present

## 2018-07-03 DIAGNOSIS — I5033 Acute on chronic diastolic (congestive) heart failure: Secondary | ICD-10-CM | POA: Diagnosis present

## 2018-07-03 DIAGNOSIS — J810 Acute pulmonary edema: Secondary | ICD-10-CM | POA: Diagnosis not present

## 2018-07-03 DIAGNOSIS — J309 Allergic rhinitis, unspecified: Secondary | ICD-10-CM | POA: Diagnosis present

## 2018-07-03 DIAGNOSIS — E119 Type 2 diabetes mellitus without complications: Secondary | ICD-10-CM | POA: Diagnosis present

## 2018-07-03 DIAGNOSIS — Z7901 Long term (current) use of anticoagulants: Secondary | ICD-10-CM

## 2018-07-03 DIAGNOSIS — I5031 Acute diastolic (congestive) heart failure: Secondary | ICD-10-CM | POA: Diagnosis not present

## 2018-07-03 DIAGNOSIS — I509 Heart failure, unspecified: Secondary | ICD-10-CM

## 2018-07-03 DIAGNOSIS — E1169 Type 2 diabetes mellitus with other specified complication: Secondary | ICD-10-CM | POA: Diagnosis present

## 2018-07-03 DIAGNOSIS — E669 Obesity, unspecified: Secondary | ICD-10-CM | POA: Diagnosis not present

## 2018-07-03 DIAGNOSIS — Z923 Personal history of irradiation: Secondary | ICD-10-CM

## 2018-07-03 DIAGNOSIS — F410 Panic disorder [episodic paroxysmal anxiety] without agoraphobia: Secondary | ICD-10-CM | POA: Diagnosis present

## 2018-07-03 DIAGNOSIS — I110 Hypertensive heart disease with heart failure: Secondary | ICD-10-CM | POA: Diagnosis present

## 2018-07-03 DIAGNOSIS — Z86718 Personal history of other venous thrombosis and embolism: Secondary | ICD-10-CM

## 2018-07-03 DIAGNOSIS — E876 Hypokalemia: Secondary | ICD-10-CM | POA: Diagnosis not present

## 2018-07-03 DIAGNOSIS — Z9119 Patient's noncompliance with other medical treatment and regimen: Secondary | ICD-10-CM

## 2018-07-03 DIAGNOSIS — F0390 Unspecified dementia without behavioral disturbance: Secondary | ICD-10-CM | POA: Diagnosis present

## 2018-07-03 DIAGNOSIS — Z6839 Body mass index (BMI) 39.0-39.9, adult: Secondary | ICD-10-CM | POA: Diagnosis not present

## 2018-07-03 DIAGNOSIS — Z79899 Other long term (current) drug therapy: Secondary | ICD-10-CM | POA: Diagnosis not present

## 2018-07-03 DIAGNOSIS — E785 Hyperlipidemia, unspecified: Secondary | ICD-10-CM | POA: Diagnosis present

## 2018-07-03 DIAGNOSIS — Z7984 Long term (current) use of oral hypoglycemic drugs: Secondary | ICD-10-CM | POA: Diagnosis not present

## 2018-07-03 DIAGNOSIS — Z86711 Personal history of pulmonary embolism: Secondary | ICD-10-CM | POA: Diagnosis not present

## 2018-07-03 DIAGNOSIS — Z79890 Hormone replacement therapy (postmenopausal): Secondary | ICD-10-CM

## 2018-07-03 DIAGNOSIS — Z884 Allergy status to anesthetic agent status: Secondary | ICD-10-CM

## 2018-07-03 DIAGNOSIS — Z881 Allergy status to other antibiotic agents status: Secondary | ICD-10-CM

## 2018-07-03 DIAGNOSIS — Z6841 Body Mass Index (BMI) 40.0 and over, adult: Secondary | ICD-10-CM

## 2018-07-03 DIAGNOSIS — R4189 Other symptoms and signs involving cognitive functions and awareness: Secondary | ICD-10-CM | POA: Diagnosis present

## 2018-07-03 DIAGNOSIS — Z7951 Long term (current) use of inhaled steroids: Secondary | ICD-10-CM

## 2018-07-03 DIAGNOSIS — J9621 Acute and chronic respiratory failure with hypoxia: Secondary | ICD-10-CM | POA: Diagnosis present

## 2018-07-03 DIAGNOSIS — Z8542 Personal history of malignant neoplasm of other parts of uterus: Secondary | ICD-10-CM | POA: Diagnosis not present

## 2018-07-03 DIAGNOSIS — N179 Acute kidney failure, unspecified: Secondary | ICD-10-CM | POA: Diagnosis not present

## 2018-07-03 DIAGNOSIS — I10 Essential (primary) hypertension: Secondary | ICD-10-CM | POA: Diagnosis present

## 2018-07-03 DIAGNOSIS — C541 Malignant neoplasm of endometrium: Secondary | ICD-10-CM | POA: Diagnosis present

## 2018-07-03 DIAGNOSIS — Z8249 Family history of ischemic heart disease and other diseases of the circulatory system: Secondary | ICD-10-CM

## 2018-07-03 DIAGNOSIS — T501X5A Adverse effect of loop [high-ceiling] diuretics, initial encounter: Secondary | ICD-10-CM | POA: Diagnosis not present

## 2018-07-03 DIAGNOSIS — Z9981 Dependence on supplemental oxygen: Secondary | ICD-10-CM | POA: Diagnosis not present

## 2018-07-03 DIAGNOSIS — Z888 Allergy status to other drugs, medicaments and biological substances status: Secondary | ICD-10-CM

## 2018-07-03 DIAGNOSIS — Z22322 Carrier or suspected carrier of Methicillin resistant Staphylococcus aureus: Secondary | ICD-10-CM

## 2018-07-03 DIAGNOSIS — F329 Major depressive disorder, single episode, unspecified: Secondary | ICD-10-CM | POA: Diagnosis present

## 2018-07-03 DIAGNOSIS — E039 Hypothyroidism, unspecified: Secondary | ICD-10-CM | POA: Diagnosis present

## 2018-07-03 DIAGNOSIS — Z885 Allergy status to narcotic agent status: Secondary | ICD-10-CM

## 2018-07-03 DIAGNOSIS — Z95828 Presence of other vascular implants and grafts: Secondary | ICD-10-CM

## 2018-07-03 DIAGNOSIS — Z7401 Bed confinement status: Secondary | ICD-10-CM

## 2018-07-03 LAB — I-STAT ARTERIAL BLOOD GAS, ED
Acid-Base Excess: 3 mmol/L — ABNORMAL HIGH (ref 0.0–2.0)
BICARBONATE: 30.9 mmol/L — AB (ref 20.0–28.0)
O2 Saturation: 97 %
TCO2: 33 mmol/L — AB (ref 22–32)
pCO2 arterial: 58.5 mmHg — ABNORMAL HIGH (ref 32.0–48.0)
pH, Arterial: 7.326 — ABNORMAL LOW (ref 7.350–7.450)
pO2, Arterial: 94 mmHg (ref 83.0–108.0)

## 2018-07-03 LAB — CBC WITH DIFFERENTIAL/PLATELET
ABS IMMATURE GRANULOCYTES: 0.1 10*3/uL (ref 0.0–0.1)
BASOS ABS: 0.1 10*3/uL (ref 0.0–0.1)
BASOS PCT: 1 %
EOS ABS: 0.2 10*3/uL (ref 0.0–0.7)
Eosinophils Relative: 2 %
HCT: 40.4 % (ref 36.0–46.0)
Hemoglobin: 12.4 g/dL (ref 12.0–15.0)
Immature Granulocytes: 0 %
Lymphocytes Relative: 14 %
Lymphs Abs: 1.7 10*3/uL (ref 0.7–4.0)
MCH: 29.8 pg (ref 26.0–34.0)
MCHC: 30.7 g/dL (ref 30.0–36.0)
MCV: 97.1 fL (ref 78.0–100.0)
MONO ABS: 1 10*3/uL (ref 0.1–1.0)
Monocytes Relative: 8 %
Neutro Abs: 8.9 10*3/uL — ABNORMAL HIGH (ref 1.7–7.7)
Neutrophils Relative %: 75 %
PLATELETS: 367 10*3/uL (ref 150–400)
RBC: 4.16 MIL/uL (ref 3.87–5.11)
RDW: 14.6 % (ref 11.5–15.5)
WBC: 11.8 10*3/uL — ABNORMAL HIGH (ref 4.0–10.5)

## 2018-07-03 LAB — BRAIN NATRIURETIC PEPTIDE: B NATRIURETIC PEPTIDE 5: 1091.5 pg/mL — AB (ref 0.0–100.0)

## 2018-07-03 LAB — CBG MONITORING, ED
GLUCOSE-CAPILLARY: 102 mg/dL — AB (ref 70–99)
Glucose-Capillary: 144 mg/dL — ABNORMAL HIGH (ref 70–99)

## 2018-07-03 LAB — I-STAT TROPONIN, ED: Troponin i, poc: 0.04 ng/mL (ref 0.00–0.08)

## 2018-07-03 LAB — TSH: TSH: 2.463 u[IU]/mL (ref 0.350–4.500)

## 2018-07-03 LAB — BASIC METABOLIC PANEL
ANION GAP: 11 (ref 5–15)
BUN: 7 mg/dL — ABNORMAL LOW (ref 8–23)
CALCIUM: 10.2 mg/dL (ref 8.9–10.3)
CO2: 31 mmol/L (ref 22–32)
Chloride: 100 mmol/L (ref 98–111)
Creatinine, Ser: 1.03 mg/dL — ABNORMAL HIGH (ref 0.44–1.00)
GFR, EST AFRICAN AMERICAN: 60 mL/min — AB (ref >60)
GFR, EST NON AFRICAN AMERICAN: 51 mL/min — AB (ref >60)
GLUCOSE: 134 mg/dL — AB (ref 70–99)
Potassium: 3.1 mmol/L — ABNORMAL LOW (ref 3.5–5.1)
Sodium: 142 mmol/L (ref 135–145)

## 2018-07-03 LAB — MRSA PCR SCREENING: MRSA BY PCR: POSITIVE — AB

## 2018-07-03 LAB — PROTIME-INR
INR: 1.13
Prothrombin Time: 14.4 seconds (ref 11.4–15.2)

## 2018-07-03 LAB — TROPONIN I
Troponin I: 0.03 ng/mL (ref <0.03)
Troponin I: <0.03 ng/mL (ref <0.03)

## 2018-07-03 LAB — I-STAT CG4 LACTIC ACID, ED: Lactic Acid, Venous: 1.9 mmol/L (ref 0.5–1.9)

## 2018-07-03 LAB — GLUCOSE, CAPILLARY
GLUCOSE-CAPILLARY: 101 mg/dL — AB (ref 70–99)
Glucose-Capillary: 123 mg/dL — ABNORMAL HIGH (ref 70–99)

## 2018-07-03 MED ORDER — PANTOPRAZOLE SODIUM 40 MG PO TBEC
40.0000 mg | DELAYED_RELEASE_TABLET | Freq: Every day | ORAL | Status: DC
  Administered 2018-07-03 – 2018-07-08 (×6): 40 mg via ORAL
  Filled 2018-07-03 (×6): qty 1

## 2018-07-03 MED ORDER — CLONAZEPAM 1 MG PO TABS
1.0000 mg | ORAL_TABLET | Freq: Two times a day (BID) | ORAL | Status: DC
  Administered 2018-07-03: 1 mg via ORAL
  Filled 2018-07-03: qty 2
  Filled 2018-07-03: qty 1

## 2018-07-03 MED ORDER — ALPRAZOLAM 0.5 MG PO TABS: 0.5000 mg | ORAL_TABLET | Freq: Every day | ORAL | Status: DC

## 2018-07-03 MED ORDER — BUPROPION HCL ER (XL) 300 MG PO TB24
300.0000 mg | ORAL_TABLET | Freq: Every day | ORAL | Status: DC
  Administered 2018-07-03 – 2018-07-08 (×6): 300 mg via ORAL
  Filled 2018-07-03 (×2): qty 1
  Filled 2018-07-03: qty 2
  Filled 2018-07-03 (×3): qty 1
  Filled 2018-07-03: qty 2

## 2018-07-03 MED ORDER — APIXABAN 2.5 MG PO TABS
2.5000 mg | ORAL_TABLET | Freq: Two times a day (BID) | ORAL | Status: DC
  Administered 2018-07-03 – 2018-07-08 (×11): 2.5 mg via ORAL
  Filled 2018-07-03 (×11): qty 1

## 2018-07-03 MED ORDER — SODIUM CHLORIDE 0.9% FLUSH
3.0000 mL | Freq: Two times a day (BID) | INTRAVENOUS | Status: DC
  Administered 2018-07-03 – 2018-07-08 (×11): 3 mL via INTRAVENOUS

## 2018-07-03 MED ORDER — INFLUENZA VAC SPLIT HIGH-DOSE 0.5 ML IM SUSY
0.5000 mL | PREFILLED_SYRINGE | INTRAMUSCULAR | Status: AC
  Administered 2018-07-04: 0.5 mL via INTRAMUSCULAR
  Filled 2018-07-03: qty 0.5

## 2018-07-03 MED ORDER — ALPRAZOLAM ER 0.5 MG PO TB24: 0.5000 mg | ORAL_TABLET | Freq: Every evening | ORAL | Status: DC

## 2018-07-03 MED ORDER — MEGESTROL ACETATE 40 MG PO TABS
80.0000 mg | ORAL_TABLET | Freq: Two times a day (BID) | ORAL | Status: DC
  Administered 2018-07-03 – 2018-07-05 (×5): 80 mg via ORAL
  Filled 2018-07-03 (×5): qty 2

## 2018-07-03 MED ORDER — FUROSEMIDE 10 MG/ML IJ SOLN
40.0000 mg | Freq: Once | INTRAMUSCULAR | Status: AC
  Administered 2018-07-03: 40 mg via INTRAVENOUS
  Filled 2018-07-03: qty 4

## 2018-07-03 MED ORDER — LISINOPRIL 10 MG PO TABS
10.0000 mg | ORAL_TABLET | Freq: Every day | ORAL | Status: DC
  Administered 2018-07-03 – 2018-07-04 (×2): 10 mg via ORAL
  Filled 2018-07-03 (×4): qty 1

## 2018-07-03 MED ORDER — FUROSEMIDE 10 MG/ML IJ SOLN
40.0000 mg | Freq: Two times a day (BID) | INTRAMUSCULAR | Status: DC
  Administered 2018-07-03 – 2018-07-05 (×4): 40 mg via INTRAVENOUS
  Filled 2018-07-03 (×4): qty 4

## 2018-07-03 MED ORDER — IOPAMIDOL (ISOVUE-370) INJECTION 76%
INTRAVENOUS | Status: AC
  Filled 2018-07-03: qty 100

## 2018-07-03 MED ORDER — INSULIN ASPART 100 UNIT/ML ~~LOC~~ SOLN: 0.0000 [IU] | Freq: Every day | SUBCUTANEOUS | Status: DC

## 2018-07-03 MED ORDER — ASPIRIN EC 81 MG PO TBEC
81.0000 mg | DELAYED_RELEASE_TABLET | Freq: Every day | ORAL | Status: DC
  Administered 2018-07-03 – 2018-07-08 (×6): 81 mg via ORAL
  Filled 2018-07-03 (×6): qty 1

## 2018-07-03 MED ORDER — ONDANSETRON HCL 4 MG/2ML IJ SOLN
4.0000 mg | Freq: Four times a day (QID) | INTRAMUSCULAR | Status: DC | PRN
  Administered 2018-07-07: 4 mg via INTRAVENOUS
  Filled 2018-07-03: qty 2

## 2018-07-03 MED ORDER — LEVOTHYROXINE SODIUM 100 MCG PO TABS
100.0000 ug | ORAL_TABLET | Freq: Every day | ORAL | Status: DC
  Administered 2018-07-03 – 2018-07-08 (×6): 100 ug via ORAL
  Filled 2018-07-03 (×6): qty 1

## 2018-07-03 MED ORDER — VENLAFAXINE HCL ER 75 MG PO CP24
225.0000 mg | ORAL_CAPSULE | Freq: Every day | ORAL | Status: DC
  Administered 2018-07-04 – 2018-07-08 (×5): 225 mg via ORAL
  Filled 2018-07-03 (×5): qty 3

## 2018-07-03 MED ORDER — CLONAZEPAM 0.5 MG PO TABS: 0.5000 mg | ORAL_TABLET | Freq: Two times a day (BID) | ORAL | Status: DC | PRN

## 2018-07-03 MED ORDER — ACETAMINOPHEN 325 MG PO TABS: 650.0000 mg | ORAL_TABLET | ORAL | Status: DC | PRN

## 2018-07-03 MED ORDER — METOPROLOL TARTRATE 12.5 MG HALF TABLET
12.5000 mg | ORAL_TABLET | Freq: Two times a day (BID) | ORAL | Status: DC
  Administered 2018-07-03 – 2018-07-05 (×5): 12.5 mg via ORAL
  Filled 2018-07-03 (×5): qty 1

## 2018-07-03 MED ORDER — SODIUM CHLORIDE 0.9% FLUSH: 3.0000 mL | INTRAVENOUS | Status: DC | PRN

## 2018-07-03 MED ORDER — INSULIN ASPART 100 UNIT/ML ~~LOC~~ SOLN
0.0000 [IU] | Freq: Three times a day (TID) | SUBCUTANEOUS | Status: DC
  Administered 2018-07-04: 3 [IU] via SUBCUTANEOUS
  Administered 2018-07-05 (×3): 2 [IU] via SUBCUTANEOUS
  Administered 2018-07-06: 3 [IU] via SUBCUTANEOUS
  Administered 2018-07-08: 2 [IU] via SUBCUTANEOUS

## 2018-07-03 MED ORDER — EZETIMIBE-SIMVASTATIN 10-20 MG PO TABS
1.0000 | ORAL_TABLET | Freq: Every day | ORAL | Status: DC
  Administered 2018-07-03 – 2018-07-07 (×5): 1 via ORAL
  Filled 2018-07-03 (×5): qty 1

## 2018-07-03 MED ORDER — FLUTICASONE PROPIONATE 50 MCG/ACT NA SUSP
2.0000 | Freq: Every day | NASAL | Status: DC
  Administered 2018-07-04 – 2018-07-08 (×5): 2 via NASAL
  Filled 2018-07-03: qty 16

## 2018-07-03 MED ORDER — SODIUM CHLORIDE 0.9 % IV SOLN: 250.0000 mL | INTRAVENOUS | Status: DC | PRN

## 2018-07-03 MED ORDER — IOPAMIDOL (ISOVUE-370) INJECTION 76%
100.0000 mL | Freq: Once | INTRAVENOUS | Status: AC | PRN
  Administered 2018-07-03: 47 mL via INTRAVENOUS

## 2018-07-03 NOTE — ED Notes (Signed)
Pt reports she has been SOB for a everyday for a while now. Pt is CAO x2, pt unable to answer where she is at and what day it is.

## 2018-07-03 NOTE — ED Notes (Signed)
Pt taking po fluids anfd tolerating well

## 2018-07-03 NOTE — ED Notes (Signed)
Lactic acid was normal

## 2018-07-03 NOTE — ED Notes (Addendum)
Patient transported to CT 

## 2018-07-03 NOTE — H&P (Signed)
History and Physical    Theresa Blake IRS:[id number] DOB: 04/07/1941 DOA: 07/03/2018  PCP: Tracie Harrier, MD  Patient coming from:  Home - she is bedbound  Chief Complaint: SOB  HPI: Theresa Blake is a 77 y.o. female with medical history significant of panic d/o; morbid obesity; uterine CA; h/o PE not on AC; HTN; HLD; and DM presenting with SOB.  She has chronic respiratory failure and is on 3L home O2.  Her son apparently found her today and called 34; fire department personnel found her with sats of 78% on room air.  She was placed on a nonrebreather with gradual improvement in sats to 96%.  She is unable to provide significant history, and this is apparently not new.  She has apparently not been taking anticoagulation.   ED Course: CXR with multifocal PNA vs. Pulmonary edema.  Suspect volume overload.  Given IV Lasix.  On 6L [place] O2.  CT with pulmonary edema.  Review of Systems: Unable to perform  Ambulatory Status:  Bedbound  Past Medical History:  Diagnosis Date  . Abnormal Pap smear of cervix   . Allergic rhinitis, cause unspecified   . Backache, unspecified   . Depression    mood disorder; ? bipolar  . Dermatomycosis, unspecified   . Diabetes mellitus type II   . Edema   . Foot fracture, right   . HLD (hyperlipidemia)   . HTN (hypertension)   . Hx pulmonary embolism    multiple  . Lumbar spinal stenosis   . Malignant neoplasm of corpus uteri, except isthmus ([place])   . Morbid obesity ([place])   . Other specified disease of hair and hair follicles   . Pain in joint, pelvic region and thigh   . Panic disorder without agoraphobia     Past Surgical History:  Procedure Laterality Date  . BREAST BIOPSY     Right-benign  . ENDOMETRIAL BIOPSY  08/2006   attempted  . IVC FILTER INSERTION N/A 11/17/2017   Procedure: IVC FILTER INSERTION;  Surgeon: Katha Cabal, MD;  Location: [facility] [place] [facility];  Service: Cardiovascular;  Laterality: N/A;  . LOWER EXTREMITY  VENOGRAPHY N/A 11/17/2017   Procedure: LOWER EXTREMITY VENOGRAPHY;  Surgeon: Katha Cabal, MD;  Location: [place] [facility];  Service: Cardiovascular;  Laterality: N/A;  . TONSILLECTOMY    . TUBAL LIGATION      Social History   Socioeconomic History  . Marital status: Widowed    Spouse name: Not on file  . Number of children: 4  . Years of education: Not on file  . Highest education level: Not on file  Occupational History  . Occupation: Disabled  Social Needs  . Financial resource strain: Not on file  . Food insecurity:    Worry: Not on file    Inability: Not on file  . Transportation needs:    Medical: Not on file    Non-medical: Not on file  Tobacco Use  . Smoking status: Never Smoker  . Smokeless tobacco: Never Used  Substance and Sexual Activity  . Alcohol use: No  . Drug use: No  . Sexual activity: Not on file  Lifestyle  . Physical activity:    Days per week: Not on file    Minutes per session: Not on file  . Stress: Not on file  Relationships  . Social connections:    Talks on phone: Not on file    Gets together: Not on file    Attends  religious service: Not on file    Active member of club or organization: Not on file    Attends meetings of clubs or organizations: Not on file    Relationship status: Not on file  . Intimate partner violence:    Fear of current or ex partner: Not on file    Emotionally abused: Not on file    Physically abused: Not on file    Forced sexual activity: Not on file  Other Topics Concern  . Not on file  Social History Narrative   Widow-husband died with MI, DM      4 sons      Disability      Compulsive overeater             Allergies  Allergen Reactions  . Anesthetics, Halogenated Shortness Of Breath  . Levaquin [Levofloxacin In D5w] Itching  . Lamotrigine Itching  . Tramadol Rash    Family History  Problem Relation Age of Onset  . Heart attack Father 64  . Pancreatic cancer Mother        with mets   . Hyperlipidemia Son   . Hyperlipidemia Son   . Gout Son     Prior to Admission medications   Medication Sig Start Date End Date Taking? Authorizing Provider  buPROPion (WELLBUTRIN XL) 150 MG 24 hr tablet Take 300 mg by mouth daily.    Yes [provider]  clonazePAM (KLONOPIN) 1 MG tablet Take 1 mg by mouth 2 (two) times daily. 06/14/18  Yes [provider]  ezetimibe-simvastatin (VYTORIN) 10-20 MG tablet Take 1 tablet by mouth at bedtime.    Yes [provider]  fluticasone (FLONASE) 50 MCG/ACT nasal spray Place 2 sprays into both nostrils daily.   Yes [provider]  furosemide (LASIX) 20 MG tablet Take 20 mg by mouth daily. 06/17/16  Yes [provider]  levothyroxine (SYNTHROID, LEVOTHROID) 50 MCG tablet Take 1 tablet (50 mcg total) by mouth daily before breakfast. Patient taking differently: Take 100 mcg by mouth daily before breakfast.  02/28/14  Yes Einar Pheasant, MD  lisinopril (PRINIVIL,ZESTRIL) 10 MG tablet Take 1 tablet (10 mg total) by mouth daily. 11/30/17  Yes Rosita Fire, MD  metFORMIN (GLUCOPHAGE) 500 MG tablet Take 500 mg by mouth daily. 05/26/18  Yes [provider]  metoprolol tartrate (LOPRESSOR) 25 MG tablet Take 0.5 tablets (12.5 mg total) by mouth 2 (two) times daily. 11/30/17  Yes Rosita Fire, MD  omeprazole (PRILOSEC) 20 MG capsule TAKE ONE CAPSULE BY MOUTH EVERY DAY *NEEDS OFFICE VISIT* Patient taking differently: Take 20 mg by mouth daily.    Yes Einar Pheasant, MD  Venlafaxine HCl 225 MG TB24 Take 225 mg by mouth daily.    Yes [provider]  acetaminophen (TYLENOL) 500 MG tablet Take 1,000 mg by mouth every 6 (six) hours as needed for mild pain, moderate pain, fever or headache.    [provider]  ALPRAZolam (XANAX XR) 0.5 MG 24 hr tablet Take 0.5 mg by mouth every evening. 06/08/18   [provider]  Blood Glucose Monitoring Suppl (ACCU-CHEK AVIVA PLUS) W/DEVICE KIT  by Does not apply route.    [provider]  buPROPion (WELLBUTRIN) 75 MG tablet Take 150 mg by mouth 2 (two) times daily. 05/30/18   [provider]  clonazePAM (KLONOPIN) 0.5 MG tablet Take 1 tablet (0.5 mg total) by mouth 2 (two) times daily as needed for anxiety. 11/30/17   Rosita Fire,  MD  diclofenac sodium (VOLTAREN) 1 % GEL APPLY 2 GM TO THE LOW BACK EVERY 8 HOURS AS NEEDED FOR LOW BACK PAIN TRANSDERMAL 05/30/18   [provider]  diphenhydrAMINE (BENADRYL) 25 MG tablet Take 25 mg by mouth every 6 (six) hours as needed for allergies.    [provider]  ELIQUIS 2.5 MG TABS tablet Take 2.5 mg by mouth 2 (two) times daily. 05/24/18   [provider]  enoxaparin (LOVENOX) 150 MG/ML injection Inject 0.93 mLs (140 mg total) into the skin every 12 (twelve) hours. 11/30/17   Rosita Fire, MD  glucose blood (ACCU-CHEK AVIVA PLUS) test strip 1 each by Other route as needed for other. Use as instructed    [provider]  megestrol (MEGACE) 40 MG tablet Take 2 tablets (80 mg total) by mouth 2 (two) times daily. 11/19/17   Fritzi Mandes, MD  Multiple Vitamin (MULTIVITAMIN WITH MINERALS) TABS tablet Take 1 tablet by mouth daily.    [provider]  mupirocin ointment (BACTROBAN) 2 % Place 1 application into the nose at bedtime.    [provider]  OXYGEN Inhale 2 L into the lungs continuous.    [provider]    Physical Exam: Vitals:   07/03/18 0615 07/03/18 0630 07/03/18 0645 07/03/18 0700  BP: (!) 157/71 (!) 157/71 134/70 (!) 155/71  Pulse: 87 86 86 87  Resp: 19 19 (!) 21 16  Temp:      TempSrc:      SpO2: 97% 97% 99% 98%  Weight:      Height:         General:  Appears calm and comfortable and is NAD; she is generally nonverbal Eyes:  PERRL, EOMI, normal lids, iris ENT:  grossly normal hearing, lips & tongue, mmm Neck:  no LAD, masses or thyromegaly Cardiovascular:  RRR, no m/r/g. No LE edema.    Respiratory:   CTA bilaterally with no wheezes/rales/rhonchi.  Mild tachypnea. Abdomen:  soft, NT, ND, NABS Back:   normal alignment, no CVAT Skin:  no rash or induration seen on limited exam Musculoskeletal:  grossly normal tone BUE/BLE, good ROM, no bony abnormality Lower extremity:  No LE edema.  Limited foot exam with no ulcerations.  2+ distal pulses. Psychiatric:  Flat mood and affect, speech very sparse with difficulty answering questions Neurologic:  CN 2-12 grossly intact, moves all extremities in coordinated fashion, sensation intact.  Very limited by her baseline MS.    Radiological Exams on Admission: Ct Angio Chest Pe W And/or Wo Contrast  Result Date: 07/03/2018 CLINICAL DATA:  PE suspected, high pretest prob. Respiratory distress. Shortness of breath. History pulmonary embolus. EXAM: CT ANGIOGRAPHY CHEST WITH CONTRAST TECHNIQUE: Multidetector CT imaging of the chest was performed using the standard protocol during bolus administration of intravenous contrast. Multiplanar CT image reconstructions and MIPs were obtained to evaluate the vascular anatomy. CONTRAST:  23m ISOVUE-370 IOPAMIDOL (ISOVUE-370) INJECTION 76% COMPARISON:  Radiograph earlier this day.  Chest CT 11/26/2017 FINDINGS: Cardiovascular: Prior pulmonary emboli have resolved. No evidence of new or progressive filling defect in the pulmonary arteries. No acute PE. Aortic atherosclerosis, cannot assess for dissection given phase of contrast. Multi chamber cardiomegaly. There are coronary artery calcifications. No pericardial effusion. Mediastinum/Nodes: Small mediastinal nodes not enlarged by size criteria. No hilar adenopathy. The esophagus is nondistended. Lungs/Pleura: Small bilateral pleural effusions and compressive basilar atelectasis. Diffusely heterogeneous geographic ground-glass opacities throughout both lungs, with scattered interspersed consolidations. Trachea and mainstem bronchi are patent  allowing for breathing  motion artifact prior. Upper Abdomen: Focal high density in the stomach likely ingested material. No acute findings. Musculoskeletal: There are no acute or suspicious osseous abnormalities. Remote anterior left rib fractures Review of the MIP images confirms the above findings. IMPRESSION: 1. No acute pulmonary embolus. Previous pulmonary emboli have resolved. 2. Geographic ground-glass and consolidative opacities throughout both lungs, differential considerations include pulmonary edema versus infectious or inflammatory etiologies. There are small bilateral pleural effusions which increase the probability of pulmonary edema and fluid overload as cause of ground-glass opacities. 3. Cardiomegaly with coronary artery calcifications. Aortic Atherosclerosis (ICD10-I70.0). Electronically Signed   By: Keith Rake M.D.   On: 07/03/2018 05:03   Dg Chest Portable 1 View  Result Date: 07/03/2018 CLINICAL DATA:  Shortness of breath tonight, history type II diabetes mellitus, hypertension, endometrial cancer history pulmonary embolism EXAM: PORTABLE CHEST 1 VIEW COMPARISON:  Portable exam 0205 hours compared to 05/23/2018 FINDINGS: Enlargement of cardiac silhouette. Atherosclerotic calcification aorta. BILATERAL pulmonary infiltrates slightly greater on RIGHT which could represent asymmetric pulmonary edema or multifocal infection. No pleural effusion or pneumothorax. Bones demineralized. IMPRESSION: Enlargement of cardiac silhouette. Patchy BILATERAL pulmonary infiltrates question asymmetric pulmonary edema versus multifocal pneumonia. Electronically Signed   By: Lavonia Dana M.D.   On: 07/03/2018 02:21    EKG: Independently reviewed.  NSR with rate 88; nonspecific ST changes with no evidence of acute ischemia; NSCSLT   Labs on Admission: I have personally reviewed the available labs and imaging studies at the time of the admission.  Pertinent labs:   K+ 3.1 Glucose 134 BUN 7/Creatinine 1.03/GFR 51 BNP  1091.5; 51.4 on 2/3 Troponin 0.04 Lactate 1.90 WBC 11.8, CBC otherwise WNL INR 1.13   Assessment/Plan Principal Problem:   Acute on chronic respiratory failure with hypoxia (HCC) Active Problems:   Hypothyroidism   Diabetes mellitus type 2 in obese (HCC)   Hyperlipidemia   Essential hypertension   Endometrial cancer (HCC)   Acute on chronic diastolic CHF (congestive heart failure) (HCC)   Cognitive impairment   Bedbound   Acute on chronic respiratory failure with hypoxia -She is on 3L home O2 and was found without her home O2 -She is a very poor historian and is unable to report the circumstances that led to admission -She does appear to be more comfortable now but has an increased O2 requirement at this time compared to her usual home requirement -Family is also not available to provide history at this time (in person or by telephone)  CHF exacerbation -She previously had an Echo in 2/19 that showed grade 1 diastolic dysfunction -Patient presenting with worsening SOB and hypoxia -CXR/CT may show infiltrates but her history and imaging are more consistent with pulmonary edema -Markedly elevated BNP -With elevated BNP and abnl CXR, CHF seems probable as diagnosis - although if she develops fever or other infectious symptoms, this may need to be reconsidered -Will admit with telemetry -Will request repeat echocardiogram  -Will start ASA -Will continue ACE and BB -CHF order set utilized; may need CHF team consult but will hold until Echo results are available -Was given Lasix 40 mg x 1 in ER and will repeat with 40 mg IV BID -Continue [place] O2 for now -Repeat EKG in AM -Will r/o with serial troponins although doubt ACS based on symptoms  HTN -Continue ACE, BB  HLD -Continue Vytorin  DM -Will check A1c -hold Glucophage -Cover with moderate-scale SSI  H/o PE -Patient is supposed to be taking Eliquis  but is not on this medication for unclear reasons -Will resume Eliquis  for now  Hypothyroidism -Check TSH -Continue Synthroid at current dose for now  Cognitive impairment -She has reported physiatric illness although this is not listed in her PMH -Will continue her home meds: Wellbutrin and Effector; Klonopin and Xanax -She lives with her son but he is not available to provide history at this time  Bedbound -She was discharged to SNF [facility]) after last hospitalization -She does appear to be challenging to care for at home, but is clean and well groomed -PT consult  Endometrial cancer -T[facility] with XRT at [facility] -Has reported intermittent vaginal bleeding - non currently  DVT prophylaxis: Eliquis Code Status:  Full  Family Communication: None present and unable to reach by telephone  Disposition Plan: To be determined Consults called: CM, PT Admission status: Admit - It is my clinical opinion that admission to INPATIENT is reasonable and necessary because of the expectation that this patient will require hospital care that crosses at least 2 midnights to treat this condition based on the medical complexity of the problems presented.  Given the aforementioned information, the predictability of an adverse outcome is felt to be significant.    Karmen Bongo MD [facility]  If note is complete, please contact covering daytime or nighttime physician. [email or web address] Password [email or web address]  07/03/2018, 8:02 AM

## 2018-07-03 NOTE — Progress Notes (Signed)
Weaned off NRB to 5 lpm .  Spo2 95%. RN aware

## 2018-07-03 NOTE — Evaluation (Signed)
Physical Therapy Evaluation Patient Details Name: Theresa Blake MRN: [id number] DOB: 11/20/1940 Today's Date: 07/03/2018   History of Present Illness  77 y.o. female with medical history significant of panic d/o; morbid obesity; uterine CA; h/o PE not on AC; HTN; HLD; and DM presenting with SOB.  She has chronic respiratory failure and is on 3L home O2. Pt son called EMS and was found with SaO2 of 78% on RA by EMS. CT revealed pulmonary edema. Pt thought to be fluid overloaded.   Clinical Impression  Pt poor historian and son could not be reached by phone. Pt reports that her son lives with her and does the cooking. Pt unable to state whether or not she can get into her wheelchair or not. H&P indicates pt is bed bound, however pt exhibits PROM of all 4 extremities WFL and limited strength. Pt given max A to sit EoB, pt unable to bring trunk to fully upright due to c/o pain in her tailbone. PT recommends SNF level rehab to improve mobility to possibly transfer to wheelchair for mobility. PT will continue to follow acutely.        Follow Up Recommendations SNF    Equipment Recommendations  None recommended by PT    Recommendations for Other Services OT consult     Precautions / Restrictions Precautions Precautions: Fall Restrictions Weight Bearing Restrictions: No      Mobility  Bed Mobility Overal bed mobility: Needs Assistance Bed Mobility: Supine to Sit;Sit to Supine     Supine to sit: Max assist Sit to supine: Max assist   General bed mobility comments: maxA to come EoB, unable to bring trunk to fully upright due to increased tailbone pain, pt requires max A to get LE back into bed                    Pertinent Vitals/Pain Pain Assessment: Faces Faces Pain Scale: Hurts whole lot Pain Location: tail bone with sitting EoB Pain Descriptors / Indicators: Sore Pain Intervention(s): Repositioned    Home Living Family/patient expects to be discharged to::  Unsure Living Arrangements: Children(son lives with her) Available Help at Discharge: Family Type of Home: House       Home Layout: Two level;Able to live on main level with bedroom/bathroom Home Equipment: Wheelchair - manual      Prior Function Level of Independence: Needs assistance                  Extremity/Trunk Assessment   Upper Extremity Assessment Upper Extremity Assessment: Generalized weakness    Lower Extremity Assessment Lower Extremity Assessment: Generalized weakness;RLE deficits/detail;LLE deficits/detail RLE Deficits / Details: PROM WFL, strength grossly 3/5 RLE Coordination: decreased gross motor LLE Deficits / Details: PROM WFL, strength grossly 3/5 LLE Coordination: decreased gross motor       Communication   Communication: No difficulties  Cognition Arousal/Alertness: Awake/alert Behavior During Therapy: Flat affect Overall Cognitive Status: Impaired/Different from baseline Area of Impairment: Following commands                 Orientation Level: Person Current Attention Level: Sustained Memory: Decreased short-term memory Following Commands: Follows one step commands inconsistently Safety/Judgement: Decreased awareness of deficits Awareness: Intellectual Problem Solving: Slow processing;Decreased initiation;Difficulty sequencing;Requires verbal cues;Requires tactile cues General Comments: pt generally confused, difficulty reporting living arrangements and PLOF      General Comments General comments (skin integrity, edema, etc.): Pt on 2L O2 via [place] on entry maintaining 92%O2 with talking pt SaO2  dropped to 84%O2 RN increased supplemental O2 to 4L and pt maintained SaO2 >92% thorughout remainder of session.        Assessment/Plan    PT Assessment Patient needs continued PT services  PT Problem List Decreased strength;Decreased activity tolerance;Decreased balance;Decreased mobility;Decreased cognition;Decreased safety  awareness;Cardiopulmonary status limiting activity;Pain       PT Treatment Interventions DME instruction;Gait training;Functional mobility training;Therapeutic activities;Therapeutic exercise;Balance training;Patient/family education    PT Goals (Current goals can be found in the Care Plan section)  Acute Rehab PT Goals Patient Stated Goal: none stated PT Goal Formulation: Patient unable to participate in goal setting Time For Goal Achievement: 07/17/18 Potential to Achieve Goals: Fair    Frequency Min 2X/week    AM-PAC PT "6 Clicks" Daily Activity  Outcome Measure Difficulty turning over in bed (including adjusting bedclothes, sheets and blankets)?: Unable Difficulty moving from lying on back to sitting on the side of the bed? : Unable Difficulty sitting down on and standing up from a chair with arms (e.g., wheelchair, bedside commode, etc,.)?: Unable Help needed moving to and from a bed to chair (including a wheelchair)?: Total Help needed walking in hospital room?: Total Help needed climbing 3-5 steps with a railing? : Total 6 Click Score: 6    End of Session Equipment Utilized During Treatment: Oxygen Activity Tolerance: Patient limited by pain Patient left: in bed;with call bell/phone within reach;with bed alarm set Nurse Communication: Mobility status PT Visit Diagnosis: Other abnormalities of gait and mobility (R26.89);Difficulty in walking, not elsewhere classified (R26.2);Muscle weakness (generalized) (M62.81);Pain Pain - part of body: (tailbone with sitting up on EoB)    Time: 1829-9371 PT Time Calculation (min) (ACUTE ONLY): 15 min   Charges:   PT Evaluation $PT Eval Moderate Complexity: 1 Mod          Kiandra Sanguinetti B. Migdalia Dk PT, DPT Acute Rehabilitation Services Pager [phone] Office [phone]   [place] 07/03/2018, 5:05 PM

## 2018-07-03 NOTE — ED Triage Notes (Signed)
Per EMS, pt coming from home with complaints of respiratory distress and ALOC. Pt was SOB on scene and found to be in the saturating in the 70's. Pt was placed on NRB and is now sating at 98%. Pt was found to be lethargic and weak on scene as well. Pt also complains of blood in the urine.

## 2018-07-03 NOTE — ED Provider Notes (Signed)
TIME SEEN: 2:07 AM  CHIEF COMPLAINT: Shortness of breath  HPI: Patient is a 77 year old female with history of obesity, diabetes, hypertension, hyperlipidemia, pulmonary emboli currently off anticoagulation who presents to the emergency department with complaints of shortness of breath.  Patient is supposed to wear it between 2 to 3 L of oxygen chronically.  Was found by the fire department laying flat with sats of 78% on room air.  Placed on a nonrebreather and sats were only at 85%.  Improved progressively with EMS with sats 96% on nonrebreather.  She denies chest pain.  Denies cough or fever.  EMS reports they were initially called out for hematuria.  Patient denies this.  Denies dysuria.  She states her son called 67.  She has a history of being a very poor historian.  ROS: Level 5 caveat secondary to patient being very poor historian.  PAST MEDICAL HISTORY/PAST SURGICAL HISTORY:  Past Medical History:  Diagnosis Date  . Abnormal Pap smear of cervix   . Allergic rhinitis, cause unspecified   . Backache, unspecified   . Depression    mood disorder; ? bipolar  . Dermatomycosis, unspecified   . Diabetes mellitus type II   . Edema   . Foot fracture, right   . HLD (hyperlipidemia)   . HTN (hypertension)   . Hx pulmonary embolism    multiple  . Lumbar spinal stenosis   . Malignant neoplasm of corpus uteri, except isthmus ([place])   . Morbid obesity ([place])   . Other specified disease of hair and hair follicles   . Pain in joint, pelvic region and thigh   . Panic disorder without agoraphobia     MEDICATIONS:  Prior to Admission medications   Medication Sig Start Date End Date Taking? Authorizing Provider  acetaminophen (TYLENOL) 500 MG tablet Take 1,000 mg by mouth every 6 (six) hours as needed for mild pain, moderate pain, fever or headache.    [provider]  ALPRAZolam (XANAX XR) 0.5 MG 24 hr tablet Take 0.5 mg by mouth every evening. 06/08/18   [provider]   Blood Glucose Monitoring Suppl (ACCU-CHEK AVIVA PLUS) W/DEVICE KIT by Does not apply route.    [provider]  buPROPion (WELLBUTRIN XL) 150 MG 24 hr tablet Take 450 mg by mouth daily.     [provider]  buPROPion (WELLBUTRIN) 75 MG tablet Take 150 mg by mouth 2 (two) times daily. 05/30/18   [provider]  clonazePAM (KLONOPIN) 0.5 MG tablet Take 1 tablet (0.5 mg total) by mouth 2 (two) times daily as needed for anxiety. 11/30/17   Rosita Fire, MD  clonazePAM (KLONOPIN) 1 MG tablet Take 1 mg by mouth 2 (two) times daily. 06/14/18   [provider]  diclofenac sodium (VOLTAREN) 1 % GEL APPLY 2 GM TO THE LOW BACK EVERY 8 HOURS AS NEEDED FOR LOW BACK PAIN TRANSDERMAL 05/30/18   [provider]  diphenhydrAMINE (BENADRYL) 25 MG tablet Take 25 mg by mouth every 6 (six) hours as needed for allergies.    [provider]  ELIQUIS 2.5 MG TABS tablet Take 2.5 mg by mouth 2 (two) times daily. 05/24/18   [provider]  enoxaparin (LOVENOX) 150 MG/ML injection Inject 0.93 mLs (140 mg total) into the skin every 12 (twelve) hours. 11/30/17   Rosita Fire, MD  ezetimibe-simvastatin (VYTORIN) 10-20 MG tablet Take 1 tablet by mouth at bedtime.     [provider]  fluticasone (FLONASE) 50 MCG/ACT  nasal spray Place 2 sprays into both nostrils daily.    [provider]  furosemide (LASIX) 20 MG tablet Take 20 mg by mouth daily. 06/17/16   [provider]  glucose blood (ACCU-CHEK AVIVA PLUS) test strip 1 each by Other route as needed for other. Use as instructed    [provider]  levothyroxine (SYNTHROID, LEVOTHROID) 50 MCG tablet Take 1 tablet (50 mcg total) by mouth daily before breakfast. 02/28/14   Einar Pheasant, MD  lisinopril (PRINIVIL,ZESTRIL) 10 MG tablet Take 1 tablet (10 mg total) by mouth daily. 11/30/17   Rosita Fire, MD  megestrol (MEGACE) 40 MG tablet Take 2 tablets (80 mg total) by  mouth 2 (two) times daily. 11/19/17   Fritzi Mandes, MD  metFORMIN (GLUCOPHAGE) 500 MG tablet Take 500 mg by mouth daily. 05/26/18   [provider]  metoprolol tartrate (LOPRESSOR) 25 MG tablet Take 0.5 tablets (12.5 mg total) by mouth 2 (two) times daily. 11/30/17   Rosita Fire, MD  Multiple Vitamin (MULTIVITAMIN WITH MINERALS) TABS tablet Take 1 tablet by mouth daily.    [provider]  mupirocin ointment (BACTROBAN) 2 % Place 1 application into the nose at bedtime.    [provider]  omeprazole (PRILOSEC) 20 MG capsule TAKE ONE CAPSULE BY MOUTH EVERY DAY *NEEDS OFFICE VISIT* Patient taking differently: TAKE ONE CAPSULE (20 MG) BY MOUTH EVERY DAY *NEEDS OFFICE VISITNicki Reaper, Charlene, MD  OXYGEN Inhale 2 L into the lungs continuous.    [provider]  venlafaxine (EFFEXOR-XR) 150 MG 24 hr capsule Take 300 mg by mouth daily.      [provider]    ALLERGIES:  Allergies  Allergen Reactions  . Anesthetics, Halogenated Shortness Of Breath  . Levaquin [Levofloxacin In D5w] Itching  . Lamotrigine Itching  . Tramadol Rash    SOCIAL HISTORY:  Social History   Tobacco Use  . Smoking status: Never Smoker  . Smokeless tobacco: Never Used  Substance Use Topics  . Alcohol use: No    FAMILY HISTORY: Family History  Problem Relation Age of Onset  . Heart attack Father 93  . Pancreatic cancer Mother        with mets  . Hyperlipidemia Son   . Hyperlipidemia Son   . Gout Son     EXAM: BP (!) 161/86   Pulse 87   Temp 98.7 F (37.1 C) (Rectal)   Resp (!) 22   Ht _0  (1.702 m)   Wt (!) 142 kg   SpO2 100%   BMI 49.03 kg/m  CONSTITUTIONAL: Alert and oriented to person and place and responds appropriately to questions intermittently.  Obese, poor historian HEAD: Normocephalic EYES: Conjunctivae clear, pupils appear equal, EOMI ENT: normal nose; moist mucous membranes NECK: Supple, no meningismus, no nuchal rigidity, no LAD   CARD: RRR; S1 and S2 appreciated; no murmurs, no clicks, no rubs, no gallops RESP: Normal chest excursion without splinting or tachypnea; breath sounds clear and equal bilaterally; no wheezes, no rhonchi, no rales, no hypoxia or respiratory distress, speaking full sentences, diminished at bases bilaterally ABD/GI: Normal bowel sounds; non-distended; soft, non-tender, no rebound, no guarding, no peritoneal signs, no hepatosplenomegaly BACK:  The back appears normal and is non-tender to palpation, there is no CVA tenderness EXT: Normal ROM in all joints; non-tender to palpation; no edema; normal capillary refill; no cyanosis, no calf tenderness or swelling    SKIN: Normal color for age and race;  warm; no rash NEURO: Moves all extremities equally PSYCH: The patient's mood and manner are appropriate. Grooming and personal hygiene are appropriate.  MEDICAL DECISION MAKING: Patient here with complaints of shortness of breath.  Improving on nonrebreather.  We will attempt to wean her off of oxygen.  ED PROGRESS: Patient here with shortness of breath.  She is a very poor historian which has been documented previously in her chart.  It appears she was hypoxic with EMS but was found not wearing oxygen at home.  She is supposed to wear 2 to 3 L at a time.  Has history of uterine cancer, previous DVT and bilateral pulmonary emboli but is no longer on Eliquis.  ABG here appears to be near baseline.  Her lungs are clear.  Not in respiratory distress at this time.  Concern for possible PE, pneumonia, CHF exacerbation although no significant sign of volume overload on examination.  Will obtain labs, chest x-ray, CTA of her chest.  Anticipate admission given hypoxia.   Chest x-ray concerning for multifocal pneumonia versus pulmonary edema.  Patient has mildly elevated white blood cell count but normal rectal temperature and normal lactate.  She is a very poor historian but denies fevers, productive cough.  BNP  elevated at 1100.  Suspect this is likely volume overload.  Will give IV Lasix.  Patient sats dropped to 89% on 4 L nasal cannula.  She is currently doing well on 6 L nasal cannula and will monitor closely to see if she needs BiPAP.   5:05 AM  CT shows no pulmonary embolus.  Redemonstrates diffuse pulmonary edema.  Will discuss with medicine for admission.   5:21 AM Discussed patient's case with hospitalist, Dr. Hal Hope.  I have recommended admission and patient (and family if present) agree with this plan. Admitting physician will place admission orders.   I reviewed all nursing notes, vitals, pertinent previous records, EKGs, lab and urine results, imaging (as available).     EKG Interpretation  Date/Time:  Monday July 03 2018 02:07:26 EDT Ventricular Rate:  88 PR Interval:    QRS Duration: 119 QT Interval:  416 QTC Calculation: 504 R Axis:   73 Text Interpretation:  Sinus rhythm Probable left atrial enlargement Incomplete right bundle branch block No significant change since last tracing Confirmed by Ward, Cyril Mourning [phone]) on 07/03/2018 2:10:13 AM           Ward, Delice Bison, DO 07/03/18 0761

## 2018-07-04 ENCOUNTER — Inpatient Hospital Stay (HOSPITAL_COMMUNITY): Payer: Medicare Other

## 2018-07-04 DIAGNOSIS — J810 Acute pulmonary edema: Secondary | ICD-10-CM

## 2018-07-04 DIAGNOSIS — E669 Obesity, unspecified: Secondary | ICD-10-CM

## 2018-07-04 DIAGNOSIS — E1169 Type 2 diabetes mellitus with other specified complication: Secondary | ICD-10-CM

## 2018-07-04 DIAGNOSIS — I5033 Acute on chronic diastolic (congestive) heart failure: Secondary | ICD-10-CM

## 2018-07-04 DIAGNOSIS — R4189 Other symptoms and signs involving cognitive functions and awareness: Secondary | ICD-10-CM

## 2018-07-04 DIAGNOSIS — I5031 Acute diastolic (congestive) heart failure: Secondary | ICD-10-CM

## 2018-07-04 LAB — ECHOCARDIOGRAM COMPLETE
Height: 67 in
Weight: 3929.48 oz

## 2018-07-04 LAB — GLUCOSE, CAPILLARY
GLUCOSE-CAPILLARY: 103 mg/dL — AB (ref 70–99)
GLUCOSE-CAPILLARY: 181 mg/dL — AB (ref 70–99)
Glucose-Capillary: 158 mg/dL — ABNORMAL HIGH (ref 70–99)
Glucose-Capillary: 120 mg/dL — ABNORMAL HIGH (ref 70–99)

## 2018-07-04 LAB — BASIC METABOLIC PANEL
Anion gap: 11 (ref 5–15)
BUN: 12 mg/dL (ref 8–23)
CHLORIDE: 99 mmol/L (ref 98–111)
CO2: 32 mmol/L (ref 22–32)
CREATININE: 1.25 mg/dL — AB (ref 0.44–1.00)
Calcium: 9.8 mg/dL (ref 8.9–10.3)
GFR calc non Af Amer: 41 mL/min — ABNORMAL LOW (ref >60)
GFR, EST AFRICAN AMERICAN: 47 mL/min — AB (ref >60)
Glucose, Bld: 117 mg/dL — ABNORMAL HIGH (ref 70–99)
Potassium: 2.8 mmol/L — ABNORMAL LOW (ref 3.5–5.1)
SODIUM: 142 mmol/L (ref 135–145)

## 2018-07-04 LAB — CBC WITH DIFFERENTIAL/PLATELET
ABS IMMATURE GRANULOCYTES: 0 10*3/uL (ref 0.0–0.1)
BASOS ABS: 0.1 10*3/uL (ref 0.0–0.1)
Basophils Relative: 1 %
EOS PCT: 3 %
Eosinophils Absolute: 0.3 10*3/uL (ref 0.0–0.7)
HEMATOCRIT: 38.4 % (ref 36.0–46.0)
Hemoglobin: 11.8 g/dL — ABNORMAL LOW (ref 12.0–15.0)
Immature Granulocytes: 0 %
LYMPHS ABS: 1.8 10*3/uL (ref 0.7–4.0)
LYMPHS PCT: 17 %
MCH: 29.3 pg (ref 26.0–34.0)
MCHC: 30.7 g/dL (ref 30.0–36.0)
MCV: 95.3 fL (ref 78.0–100.0)
MONO ABS: 1.1 10*3/uL — AB (ref 0.1–1.0)
Monocytes Relative: 10 %
NEUTROS ABS: 7.6 10*3/uL (ref 1.7–7.7)
Neutrophils Relative %: 69 %
Platelets: 328 10*3/uL (ref 150–400)
RBC: 4.03 MIL/uL (ref 3.87–5.11)
RDW: 14.8 % (ref 11.5–15.5)
WBC: 10.9 10*3/uL — ABNORMAL HIGH (ref 4.0–10.5)

## 2018-07-04 LAB — TROPONIN I

## 2018-07-04 LAB — HEMOGLOBIN A1C
Hgb A1c MFr Bld: 6.3 % — ABNORMAL HIGH (ref 4.8–5.6)
MEAN PLASMA GLUCOSE: 134 mg/dL

## 2018-07-04 MED ORDER — POTASSIUM CHLORIDE CRYS ER 20 MEQ PO TBCR
40.0000 meq | EXTENDED_RELEASE_TABLET | ORAL | Status: AC
  Administered 2018-07-04 (×3): 40 meq via ORAL
  Filled 2018-07-04 (×3): qty 2

## 2018-07-04 MED ORDER — CLONAZEPAM 0.5 MG PO TABS
0.5000 mg | ORAL_TABLET | Freq: Two times a day (BID) | ORAL | Status: DC
  Administered 2018-07-04 – 2018-07-08 (×8): 0.5 mg via ORAL
  Filled 2018-07-04 (×8): qty 1

## 2018-07-04 NOTE — Progress Notes (Signed)
PROGRESS NOTE  Theresa Blake ZJQ:[id number] DOB: [birth year] DOA: 07/03/2018 PCP: Tracie Harrier, MD   LOS: 1 day   Brief Narrative / Interim history: 77 year old female with mild cognitive impairment, morbid obesity, immobility and essentially bedbound, prior history of urine cancer, history of PE, hypertension, hyperlipidemia, diabetes mellitus was being brought to the hospital for dyspnea.  She has a history of chronic respiratory failure and is on home oxygen, 3 L nasal cannula.  She was found to have sats in the mid 89s at home and was placed on a nonrebreather and brought to the hospital.  Chest x-ray on admission showed multifocal pneumonia versus pulmonary edema, highly suspicious for volume overload.  She was placed on IV Lasix and was admitted to the hospital.  Subjective: -Does not really know why she is here, complains of mild shortness of breath, denies any chest pain, denies any abdominal pain, nausea or vomiting.  Assessment & Plan: Principal Problem:   Acute on chronic respiratory failure with hypoxia (HCC) Active Problems:   Hypothyroidism   Diabetes mellitus type 2 in obese (HCC)   Hyperlipidemia   Essential hypertension   Endometrial cancer (HCC)   Acute on chronic diastolic CHF (congestive heart failure) (HCC)   Cognitive impairment   Bedbound   Acute on chronic hypoxic respiratory failure likely in the setting of pulmonary edema -Continue oxygen, wean off to home 3 L nasal cannula if tolerates -Very poor historian and unable to recall home symptoms -Initial chest x-ray with concern for multifocal pneumonia however CT scan more consistent with pulmonary edema, patient has no infectious symptoms, she is afebrile  Hypokalemia -Due to IV Lasix, replete  Acute on chronic CHF, unknown type -2D echo pending, she was placed on IV Lasix and will continue to monitor.  Strict I's and O's, monitor renal function -Continue aspirin, lisinopril,  metoprolol  Hypertension -Blood pressure with good control, continue current regimen  History of PE -She was supposed to be on Eliquis but not currently for unclear reasons, Eliquis resumed on admission and will continue for now  Cognitive impairment -Has reported psychiatric illness although this is not clear.  Continue home medications with Wellbutrin, Effexor.  She is on Klonopin and Xanax, keep on Klonopin alone to prevent further respiratory depression  Diabetes mellitus -Continue sliding scale  Hyperlipidemia -Continue ezetimibe statin  Hypothyroidism -continue Synthroid  Bedbound status -PT recommending SNF, consulted social worker  Endometrial cancer -Treated with radiation therapy due, prior history of intermittent vaginal bleeding, may be why she is off Eliquis.  We will continue to monitor.   Scheduled Meds: . apixaban  2.5 mg Oral BID  . aspirin EC  81 mg Oral Daily  . buPROPion  300 mg Oral Daily  . clonazePAM  0.5 mg Oral BID  . ezetimibe-simvastatin  1 tablet Oral QHS  . fluticasone  2 spray Each Nare Daily  . furosemide  40 mg Intravenous Q12H  . insulin aspart  0-15 Units Subcutaneous TID WC  . insulin aspart  0-5 Units Subcutaneous QHS  . levothyroxine  100 mcg Oral QAC breakfast  . lisinopril  10 mg Oral Daily  . megestrol  80 mg Oral BID  . metoprolol tartrate  12.5 mg Oral BID  . pantoprazole  40 mg Oral Daily  . potassium chloride  40 mEq Oral Q2H  . sodium chloride flush  3 mL Intravenous Q12H  . venlafaxine XR  225 mg Oral Daily   Continuous Infusions: . sodium chloride  PRN Meds:.sodium chloride, acetaminophen, clonazePAM, ondansetron (ZOFRAN) IV, sodium chloride flush  DVT prophylaxis: eliquis  Code Status: full code Family Communication: no family at bedside Disposition Plan: TBD  Consultants:   none  Procedures:   2D echo: pending  Antimicrobials:  None    Objective: Vitals:   07/04/18 0617 07/04/18 0735 07/04/18  0753 07/04/18 0800  BP: 136/61 (!) 132/53 (!) 148/64   Pulse: 85 86    Resp:  (!) 23    Temp: 98.3 F (36.8 C) 97.8 F (36.6 C)  97.6 F (36.4 C)  TempSrc: Oral Oral  Oral  SpO2: 95% 93%    Weight:      Height:        Intake/Output Summary (Last 24 hours) at 07/04/2018 1125 Last data filed at 07/04/2018 0700 Gross per 24 hour  Intake 240 ml  Output 950 ml  Net -710 ml   Filed Weights   07/03/18 0210 07/03/18 1402 07/04/18 0300  Weight: (!) 142 kg 111.4 kg 111.4 kg    Examination:  Constitutional: NAD Eyes: lids and conjunctivae normal ENMT: mmm Respiratory: Bibasilar crackles, no wheezing heard.  Normal respiratory effort, somewhat shallow breathing pattern  Cardiovascular: Regular rate and rhythm, no murmurs / rubs / gallops. trace LE edema. Abdomen: no tenderness. Bowel sounds positive.  Skin: no rashes Neurologic: non focal   Data Reviewed: I have independently reviewed following labs and imaging studies   CBC: Recent Labs  Lab 07/03/18 0247 07/04/18 0402  WBC 11.8* 10.9*  NEUTROABS 8.9* 7.6  HGB 12.4 11.8*  HCT 40.4 38.4  MCV 97.1 95.3  PLT 367 242   Basic Metabolic Panel: Recent Labs  Lab 07/03/18 0247 07/04/18 0402  NA 142 142  K 3.1* 2.8*  CL 100 99  CO2 31 32  GLUCOSE 134* 117*  BUN 7* 12  CREATININE 1.03* 1.25*  CALCIUM 10.2 9.8   GFR: Estimated Creatinine Clearance: 49.3 mL/min (A) (by C-G formula based on SCr of 1.25 mg/dL (H)). Liver Function Tests: No results for input(s): AST, ALT, ALKPHOS, BILITOT, PROT, ALBUMIN in the last 168 hours. No results for input(s): LIPASE, AMYLASE in the last 168 hours. No results for input(s): AMMONIA in the last 168 hours. Coagulation Profile: Recent Labs  Lab 07/03/18 0247  INR 1.13   Cardiac Enzymes: Recent Labs  Lab 07/03/18 1409 07/03/18 2140 07/04/18 0402  TROPONINI 0.03* <0.03 <0.03   BNP (last 3 results) No results for input(s): PROBNP in the last 8760 hours. HbA1C: Recent Labs     07/03/18 0901  HGBA1C 6.3*   CBG: Recent Labs  Lab 07/03/18 1210 07/03/18 1601 07/03/18 2051 07/04/18 0733 07/04/18 1105  GLUCAP 102* 101* 123* 103* 158*   Lipid Profile: No results for input(s): CHOL, HDL, LDLCALC, TRIG, CHOLHDL, LDLDIRECT in the last 72 hours. Thyroid Function Tests: Recent Labs    07/03/18 1409  TSH 2.463   Anemia Panel: No results for input(s): VITAMINB12, FOLATE, FERRITIN, TIBC, IRON, RETICCTPCT in the last 72 hours. Urine analysis:    Component Value Date/Time   COLORURINE YELLOW 04/08/2012 1622   APPEARANCEUR CLEAR 04/08/2012 1622   LABSPEC 1.016 04/08/2012 1622   PHURINE 8.0 04/08/2012 1622   GLUCOSEU NEGATIVE 04/08/2012 1622   HGBUR NEGATIVE 04/08/2012 1622   HGBUR negative 02/10/2010 1135   BILIRUBINUR neg 06/11/2013 1510   KETONESUR NEGATIVE 04/08/2012 1622   PROTEINUR neg 06/11/2013 1510   PROTEINUR NEGATIVE 04/08/2012 1622   UROBILINOGEN 0.2 06/11/2013 1510   UROBILINOGEN 1.0 04/08/2012  1622   NITRITE pos 06/11/2013 1510   NITRITE NEGATIVE 04/08/2012 1622   LEUKOCYTESUR moderate (2+) 06/11/2013 1510   Sepsis Labs: Invalid input(s): PROCALCITONIN, LACTICIDVEN  Recent Results (from the past 240 hour(s))  Culture, blood (Routine x 2)     Status: None (Preliminary result)   Collection Time: 07/03/18  2:47 AM  Result Value Ref Range Status   Specimen Description BLOOD RIGHT WRIST  Final   Special Requests   Final    BOTTLES DRAWN AEROBIC AND ANAEROBIC Blood Culture adequate volume   Culture   Final    NO GROWTH 1 DAY Performed at [facility], [address] [place], [place] [address]    Report Status PENDING  Incomplete  Culture, blood (Routine x 2)     Status: None (Preliminary result)   Collection Time: 07/03/18  2:52 AM  Result Value Ref Range Status   Specimen Description BLOOD LEFT ANTECUBITAL  Final   Special Requests   Final    BOTTLES DRAWN AEROBIC AND ANAEROBIC Blood Culture adequate volume   Culture   Final     NO GROWTH 1 DAY Performed at [facility], [place] [address] [place], [place] [address]    Report Status PENDING  Incomplete  MRSA PCR Screening     Status: Abnormal   Collection Time: 07/03/18  2:07 PM  Result Value Ref Range Status   MRSA by PCR POSITIVE (A) NEGATIVE Final    Comment:        The GeneXpert MRSA Assay (FDA approved for NASAL specimens only), is one component of a comprehensive MRSA colonization surveillance program. It is not intended to diagnose MRSA infection nor to guide or monitor treatment for MRSA infections. RESULT CALLED TO, READ BACK BY AND VERIFIED WITH: Vevelyn Pat RN 07/03/18 1803 JDW Performed at [facility], [place] [address] [place], [place] [address]       Radiology Studies: Ct Angio Chest Pe W And/or Wo Contrast  Result Date: 07/03/2018 CLINICAL DATA:  PE suspected, high pretest prob. Respiratory distress. Shortness of breath. History pulmonary embolus. EXAM: CT ANGIOGRAPHY CHEST WITH CONTRAST TECHNIQUE: Multidetector CT imaging of the chest was performed using the standard protocol during bolus administration of intravenous contrast. Multiplanar CT image reconstructions and MIPs were obtained to evaluate the vascular anatomy. CONTRAST:  20mL ISOVUE-370 IOPAMIDOL (ISOVUE-370) INJECTION 76% COMPARISON:  Radiograph earlier this day.  Chest CT 11/26/2017 FINDINGS: Cardiovascular: Prior pulmonary emboli have resolved. No evidence of new or progressive filling defect in the pulmonary arteries. No acute PE. Aortic atherosclerosis, cannot assess for dissection given phase of contrast. Multi chamber cardiomegaly. There are coronary artery calcifications. No pericardial effusion. Mediastinum/Nodes: Small mediastinal nodes not enlarged by size criteria. No hilar adenopathy. The esophagus is nondistended. Lungs/Pleura: Small bilateral pleural effusions and compressive basilar atelectasis. Diffusely heterogeneous geographic ground-glass opacities throughout both  lungs, with scattered interspersed consolidations. Trachea and mainstem bronchi are patent allowing for breathing motion artifact prior. Upper Abdomen: Focal high density in the stomach likely ingested material. No acute findings. Musculoskeletal: There are no acute or suspicious osseous abnormalities. Remote anterior left rib fractures Review of the MIP images confirms the above findings. IMPRESSION: 1. No acute pulmonary embolus. Previous pulmonary emboli have resolved. 2. Geographic ground-glass and consolidative opacities throughout both lungs, differential considerations include pulmonary edema versus infectious or inflammatory etiologies. There are small bilateral pleural effusions which increase the probability of pulmonary edema and fluid overload as cause of ground-glass opacities. 3.  Cardiomegaly with coronary artery calcifications. Aortic Atherosclerosis (ICD10-I70.0). Electronically Signed   By: Keith Rake M.D.   On: 07/03/2018 05:03   Dg Chest Portable 1 View  Result Date: 07/03/2018 CLINICAL DATA:  Shortness of breath tonight, history type II diabetes mellitus, hypertension, endometrial cancer history pulmonary embolism EXAM: PORTABLE CHEST 1 VIEW COMPARISON:  Portable exam 0205 hours compared to 05/23/2018 FINDINGS: Enlargement of cardiac silhouette. Atherosclerotic calcification aorta. BILATERAL pulmonary infiltrates slightly greater on RIGHT which could represent asymmetric pulmonary edema or multifocal infection. No pleural effusion or pneumothorax. Bones demineralized. IMPRESSION: Enlargement of cardiac silhouette. Patchy BILATERAL pulmonary infiltrates question asymmetric pulmonary edema versus multifocal pneumonia. Electronically Signed   By: Lavonia Dana M.D.   On: 07/03/2018 02:21    Marzetta Board, MD, PhD [facility] Pager [phone]  If 7PM-7AM, please contact night-coverage [email or web address] Password [email or web address] 07/04/2018, 11:25 AM

## 2018-07-04 NOTE — Progress Notes (Signed)
  Echocardiogram 2D Echocardiogram has been performed.  Jannett Celestine 07/04/2018, 10:59 AM

## 2018-07-05 LAB — CBC
HEMATOCRIT: 37.2 % (ref 36.0–46.0)
Hemoglobin: 11.4 g/dL — ABNORMAL LOW (ref 12.0–15.0)
MCH: 29.5 pg (ref 26.0–34.0)
MCHC: 30.6 g/dL (ref 30.0–36.0)
MCV: 96.1 fL (ref 78.0–100.0)
Platelets: 317 10*3/uL (ref 150–400)
RBC: 3.87 MIL/uL (ref 3.87–5.11)
RDW: 15.1 % (ref 11.5–15.5)
WBC: 13.1 10*3/uL — AB (ref 4.0–10.5)

## 2018-07-05 LAB — BASIC METABOLIC PANEL
ANION GAP: 10 (ref 5–15)
BUN: 22 mg/dL (ref 8–23)
CHLORIDE: 100 mmol/L (ref 98–111)
CO2: 28 mmol/L (ref 22–32)
Calcium: 9.7 mg/dL (ref 8.9–10.3)
Creatinine, Ser: 1.87 mg/dL — ABNORMAL HIGH (ref 0.44–1.00)
GFR calc Af Amer: 29 mL/min — ABNORMAL LOW (ref >60)
GFR, EST NON AFRICAN AMERICAN: 25 mL/min — AB (ref >60)
Glucose, Bld: 126 mg/dL — ABNORMAL HIGH (ref 70–99)
POTASSIUM: 3.9 mmol/L (ref 3.5–5.1)
SODIUM: 138 mmol/L (ref 135–145)

## 2018-07-05 LAB — GLUCOSE, CAPILLARY
GLUCOSE-CAPILLARY: 129 mg/dL — AB (ref 70–99)
GLUCOSE-CAPILLARY: 130 mg/dL — AB (ref 70–99)
GLUCOSE-CAPILLARY: 114 mg/dL — AB (ref 70–99)
Glucose-Capillary: 124 mg/dL — ABNORMAL HIGH (ref 70–99)

## 2018-07-05 NOTE — Progress Notes (Signed)
Physical Therapy Treatment Patient Details Name: Theresa Blake MRN: [id number] DOB: [birth year] Today's Date: 07/05/2018    History of Present Illness 77 y.o. female with medical history significant of panic d/o; morbid obesity; uterine CA; h/o PE not on AC; HTN; HLD; and DM presenting with SOB.  She has chronic respiratory failure and is on 3L home O2. Pt son called EMS and was found with SaO2 of 78% on RA by EMS. CT revealed pulmonary edema. Pt thought to be fluid overloaded.     PT Comments    Pt states that she was able to sit EoB at home, and therapist request she demonstrate. As pt begins to move she states she is afraid that she will fall. PT begins to assist in movement of LE to EoB and pt becomes agitated stating that her tailbone hurts and she won't move any more.  Pt maxAx2 for rolling for pillow placement to relieve sacral pressure. PT notes placement of sacral foam to relieve pressure. RN notified of increased sacral pain.    Follow Up Recommendations  SNF     Equipment Recommendations  None recommended by PT    Recommendations for Other Services OT consult     Precautions / Restrictions Precautions Precautions: Fall Restrictions Weight Bearing Restrictions: No    Mobility  Bed Mobility Overal bed mobility: Needs Assistance Bed Mobility: Rolling Rolling: Max assist         General bed mobility comments: maxA for rolling side to side for rotating pillow placement under R hip to reduce pressure on sacrum               Cognition Arousal/Alertness: Awake/alert Behavior During Therapy: Flat affect;Anxious Overall Cognitive Status: Impaired/Different from baseline Area of Impairment: Following commands                 Orientation Level: Person Current Attention Level: Sustained Memory: Decreased short-term memory Following Commands: Follows one step commands inconsistently Safety/Judgement: Decreased awareness of deficits Awareness:  Intellectual Problem Solving: Slow processing;Decreased initiation;Difficulty sequencing;Requires verbal cues;Requires tactile cues General Comments: pt continues to be confused, states she was able to sit EoB at home but then started yelling when attempted to get to [place] Comments General comments (skin integrity, edema, etc.): VSS       Pertinent Vitals/Pain Pain Assessment: 0-10 Faces Pain Scale: Hurts little more Pain Location: generalized with rolling Pain Descriptors / Indicators: Sore           PT Goals (current goals can now be found in the care plan section) Acute Rehab PT Goals Patient Stated Goal: none stated PT Goal Formulation: Patient unable to participate in goal setting Time For Goal Achievement: 07/17/18 Potential to Achieve Goals: Fair    Frequency    Min 2X/week      PT Plan Current plan remains appropriate       AM-PAC PT "6 Clicks" Daily Activity  Outcome Measure  Difficulty turning over in bed (including adjusting bedclothes, sheets and blankets)?: Unable Difficulty moving from lying on back to sitting on the side of the bed? : Unable Difficulty sitting down on and standing up from a chair with arms (e.g., wheelchair, bedside commode, etc,.)?: Unable Help needed moving to and from a bed to chair (including a wheelchair)?: Total Help needed walking in hospital room?: Total Help needed climbing 3-5 steps with a railing? : Total 6 Click Score: 6    End of  Session Equipment Utilized During Treatment: Oxygen Activity Tolerance: Patient limited by pain Patient left: in bed;with call bell/phone within reach;with bed alarm set Nurse Communication: Mobility status PT Visit Diagnosis: Other abnormalities of gait and mobility (R26.89);Difficulty in walking, not elsewhere classified (R26.2);Muscle weakness (generalized) (M62.81);Pain Pain - part of body: (sacrum)     Time: 0086-7619 PT Time Calculation (min) (ACUTE  ONLY): 18 min  Charges:  $Therapeutic Activity: 8-22 mins                     Leasa Kincannon B. Migdalia Dk PT, DPT Acute Rehabilitation Services Pager [phone] Office [phone]    [place] 07/05/2018, 2:08 PM

## 2018-07-05 NOTE — Discharge Instructions (Signed)

## 2018-07-05 NOTE — Progress Notes (Signed)
[facility] Progress Note  Patient: Theresa Blake [id number]:[id number]   PCP: Tracie Harrier, MD DOB: 11/29/1940   DOA: 07/03/2018   DOS: 07/05/2018   Date of Service: the patient was seen and examined on 07/05/2018  Subjective: No acute complaints reported.  Mid conversation patient will forget the question and will ask you to repeat it.  Denies any chest pain or abdominal pain.  Breathing is better.  No nausea no vomiting.  Brief hospital course: Pt. with PMH of dementia, obesity, immobility and essentially bedbound, uterine cancer, PE, HTN, HLD, type II DM; admitted on 07/03/2018, presented with complaint of shortness of breath, was found to have acute on chronic diastolic CHF. Currently further plan is monitor renal function.  Assessment and Plan: 1.  Acute on chronic hypoxic respiratory failure. Acute on chronic diastolic CHF. Needed oxygen more than her baseline on admission. Currently on 3 L. CT of the chest is positive for groundglass opacity concerning for pulmonary edema. BNP will not be trustworthy in the situation. Echo shows 50 to 55% EF, no wall motion abnormality. Shows moderate RV dysfunction as well.  No TR and normal PASP based on echo. Patient was given IV Lasix given.  Only -1 L during the course of hospitalization although I question the accuracy of ins and outs. In the meantime renal function has increased from 1.03 on admission to 1.87. I will currently hold off on Lasix and monitor renal function. We will transition to oral Lasix likely tomorrow.  2.  Hypokalemia. Acute kidney injury. Repleted. Holding Lasix for now. Recheck tomorrow.  3.  Essential HTN. Blood pressure good. Monitor.  4.  History of PE. On Eliquis. Monitor while the patient's renal function is fluctuating.  5.  Cognitive impairment. Suspect the patient actually has a dementia. Continuing home medication of Wellbutrin and Effexor. Discontinue Xanax. Klonopin dose was  reduced. Monitor.  6.  History of endometrial cancer. Stable. Monitor. Outpatient follow-up.  7.  Reported bedbound status. Patient does not have any pressure sores or ulcers. Patient was able to sit out of bed at home. Currently severely weak and deconditioned SNF recommended.  Diet: Cardiac diet DVT Prophylaxis: on therapeutic anticoagulation.  Advance goals of care discussion: Full code  Family Communication: no family was present at bedside, at the time of interview.   Disposition:  Discharge to SNF.  Consultants: none Procedures: none  Antibiotics: Anti-infectives (From admission, onward)   None       Objective: Physical Exam: Vitals:   07/05/18 0500 07/05/18 0826 07/05/18 1213 07/05/18 1729  BP:  (!) 133/58 107/84 (!) 97/51  Pulse:  87 79 92  Resp:  18 17 15   Temp:  98.3 F (36.8 C) (!) 97.4 F (36.3 C) 97.6 F (36.4 C)  TempSrc:  Oral Axillary Axillary  SpO2:  96% 93%   Weight: 113.1 kg     Height:        Intake/Output Summary (Last 24 hours) at 07/05/2018 1745 Last data filed at 07/05/2018 1400 Gross per 24 hour  Intake 220 ml  Output 100 ml  Net 120 ml   Filed Weights   07/03/18 1402 07/04/18 0300 07/05/18 0500  Weight: 111.4 kg 111.4 kg 113.1 kg   General: Alert, Awake and Oriented to Time, Place and Person. Appear in moderate distress, affect flat Eyes: PERRL, Conjunctiva normal ENT: Oral Mucosa clear moist. Neck: difficult to assess  JVD, no Abnormal Mass Or lumps Cardiovascular: S1 and S2 Present, no Murmur, Peripheral Pulses Present Respiratory:  increased respiratory effort, Bilateral Air entry equal and Decreased, no use of accessory muscle, bilateral  Crackles, no wheezes Abdomen: Bowel Sound present, Soft and no tenderness, no hernia Skin: no redness, no Rash, no induration Extremities: bilateral  Pedal edema, no calf tenderness Neurologic: Grossly no focal neuro deficit. Bilaterally Equal motor strength  Data  Reviewed: CBC: Recent Labs  Lab 07/03/18 0247 07/04/18 0402 07/05/18 0258  WBC 11.8* 10.9* 13.1*  NEUTROABS 8.9* 7.6  --   HGB 12.4 11.8* 11.4*  HCT 40.4 38.4 37.2  MCV 97.1 95.3 96.1  PLT 367 328 552   Basic Metabolic Panel: Recent Labs  Lab 07/03/18 0247 07/04/18 0402 07/05/18 0258  NA 142 142 138  K 3.1* 2.8* 3.9  CL 100 99 100  CO2 31 32 28  GLUCOSE 134* 117* 126*  BUN 7* 12 22  CREATININE 1.03* 1.25* 1.87*  CALCIUM 10.2 9.8 9.7    Liver Function Tests: No results for input(s): AST, ALT, ALKPHOS, BILITOT, PROT, ALBUMIN in the last 168 hours. No results for input(s): LIPASE, AMYLASE in the last 168 hours. No results for input(s): AMMONIA in the last 168 hours. Coagulation Profile: Recent Labs  Lab 07/03/18 0247  INR 1.13   Cardiac Enzymes: Recent Labs  Lab 07/03/18 1409 07/03/18 2140 07/04/18 0402  TROPONINI 0.03* <0.03 <0.03   BNP (last 3 results) No results for input(s): PROBNP in the last 8760 hours. CBG: Recent Labs  Lab 07/04/18 1556 07/04/18 2145 07/05/18 0821 07/05/18 1215 07/05/18 1727  GLUCAP 120* 181* 124* 129* 130*   Studies: No results found.  Scheduled Meds: . apixaban  2.5 mg Oral BID  . aspirin EC  81 mg Oral Daily  . buPROPion  300 mg Oral Daily  . clonazePAM  0.5 mg Oral BID  . ezetimibe-simvastatin  1 tablet Oral QHS  . fluticasone  2 spray Each Nare Daily  . insulin aspart  0-15 Units Subcutaneous TID WC  . insulin aspart  0-5 Units Subcutaneous QHS  . levothyroxine  100 mcg Oral QAC breakfast  . pantoprazole  40 mg Oral Daily  . sodium chloride flush  3 mL Intravenous Q12H  . venlafaxine XR  225 mg Oral Daily   Continuous Infusions: . sodium chloride     PRN Meds: sodium chloride, acetaminophen, clonazePAM, ondansetron (ZOFRAN) IV, sodium chloride flush  Time spent: 35 minutes  Author: Berle Mull, MD [facility] Pager: [phone] 07/05/2018 5:45 PM  If 7PM-7AM, please contact night-coverage at  [email or web address], password [facility]

## 2018-07-06 ENCOUNTER — Inpatient Hospital Stay (HOSPITAL_COMMUNITY): Payer: Medicare Other

## 2018-07-06 LAB — CBC WITH DIFFERENTIAL/PLATELET
ABS IMMATURE GRANULOCYTES: 0.1 10*3/uL (ref 0.0–0.1)
BASOS PCT: 0 %
Basophils Absolute: 0 10*3/uL (ref 0.0–0.1)
EOS ABS: 0.5 10*3/uL (ref 0.0–0.7)
Eosinophils Relative: 4 %
HCT: 39.1 % (ref 36.0–46.0)
Hemoglobin: 12 g/dL (ref 12.0–15.0)
IMMATURE GRANULOCYTES: 0 %
Lymphocytes Relative: 15 %
Lymphs Abs: 1.7 10*3/uL (ref 0.7–4.0)
MCH: 29.1 pg (ref 26.0–34.0)
MCHC: 30.7 g/dL (ref 30.0–36.0)
MCV: 94.7 fL (ref 78.0–100.0)
Monocytes Absolute: 1.2 10*3/uL — ABNORMAL HIGH (ref 0.1–1.0)
Monocytes Relative: 11 %
NEUTROS ABS: 8 10*3/uL — AB (ref 1.7–7.7)
NEUTROS PCT: 70 %
PLATELETS: 320 10*3/uL (ref 150–400)
RBC: 4.13 MIL/uL (ref 3.87–5.11)
RDW: 15 % (ref 11.5–15.5)
WBC: 11.5 10*3/uL — ABNORMAL HIGH (ref 4.0–10.5)

## 2018-07-06 LAB — GLUCOSE, CAPILLARY
GLUCOSE-CAPILLARY: 119 mg/dL — AB (ref 70–99)
Glucose-Capillary: 158 mg/dL — ABNORMAL HIGH (ref 70–99)
Glucose-Capillary: 160 mg/dL — ABNORMAL HIGH (ref 70–99)

## 2018-07-06 LAB — MAGNESIUM: Magnesium: 1.4 mg/dL — ABNORMAL LOW (ref 1.7–2.4)

## 2018-07-06 LAB — COMPREHENSIVE METABOLIC PANEL
ALBUMIN: 2.7 g/dL — AB (ref 3.5–5.0)
ALT: 6 U/L (ref 0–44)
AST: 10 U/L — AB (ref 15–41)
Alkaline Phosphatase: 59 U/L (ref 38–126)
Anion gap: 10 (ref 5–15)
BUN: 25 mg/dL — ABNORMAL HIGH (ref 8–23)
CALCIUM: 10 mg/dL (ref 8.9–10.3)
CO2: 28 mmol/L (ref 22–32)
CREATININE: 1.56 mg/dL — AB (ref 0.44–1.00)
Chloride: 101 mmol/L (ref 98–111)
GFR calc Af Amer: 36 mL/min — ABNORMAL LOW (ref >60)
GFR, EST NON AFRICAN AMERICAN: 31 mL/min — AB (ref >60)
GLUCOSE: 125 mg/dL — AB (ref 70–99)
Potassium: 3.8 mmol/L (ref 3.5–5.1)
SODIUM: 139 mmol/L (ref 135–145)
TOTAL PROTEIN: 6.3 g/dL — AB (ref 6.5–8.1)
Total Bilirubin: 0.7 mg/dL (ref 0.3–1.2)

## 2018-07-06 MED ORDER — MAGNESIUM SULFATE 2 GM/50ML IV SOLN
2.0000 g | Freq: Once | INTRAVENOUS | Status: AC
  Administered 2018-07-06: 2 g via INTRAVENOUS
  Filled 2018-07-06: qty 50

## 2018-07-06 MED ORDER — ALBUMIN HUMAN 25 % IV SOLN
25.0000 g | Freq: Once | INTRAVENOUS | Status: AC
  Administered 2018-07-06: 25 g via INTRAVENOUS
  Filled 2018-07-06: qty 50

## 2018-07-06 MED ORDER — FUROSEMIDE 10 MG/ML IJ SOLN
40.0000 mg | Freq: Two times a day (BID) | INTRAMUSCULAR | Status: DC
  Administered 2018-07-07: 40 mg via INTRAVENOUS
  Filled 2018-07-06: qty 4

## 2018-07-06 MED ORDER — ALBUMIN HUMAN 25 % IV SOLN
INTRAVENOUS | Status: AC
  Filled 2018-07-06: qty 50

## 2018-07-06 NOTE — Progress Notes (Signed)
[facility] Progress Note  Patient: Theresa Blake ZCH:[id number]   PCP: Tracie Harrier, MD DOB: May 28, 1941   DOA: 07/03/2018   DOS: 07/06/2018   Date of Service: the patient was seen and examined on 07/06/2018  Subjective: Feeling better no nausea no vomiting.  Less forgetful today as compared to yesterday.  Brief hospital course: Pt. with PMH of dementia, obesity, immobility and essentially bedbound, uterine cancer, PE, HTN, HLD, type II DM; admitted on 07/03/2018, presented with complaint of shortness of breath, was found to have acute on chronic diastolic CHF. Currently further plan is monitor renal function.  Assessment and Plan: 1.  Acute on chronic hypoxic respiratory failure. Acute on chronic diastolic CHF. Needed oxygen more than her baseline on admission. Currently on 3 L. CT of the chest is positive for groundglass opacity concerning for pulmonary edema. BNP will not be trustworthy in the situation. Echo shows 50 to 55% EF, no wall motion abnormality. Shows moderate RV dysfunction as well.  No TR and normal PASP based on echo. Patient was given IV Lasix given.  Only -1 L during the course of hospitalization although I question the accuracy of ins and outs. In the meantime renal function has increased from 1.03 on admission to 1.87.  Now trending down. Will give IV albumin followed by IV Lasix and monitor renal function.  2.  Hypokalemia. Acute kidney injury. Repleted.  3.  Essential HTN. Blood pressure good. Monitor.  4.  History of PE. On Eliquis. Monitor while the patient's renal function is fluctuating.  5.  Cognitive impairment. Suspect the patient actually has a dementia. Continuing home medication of Wellbutrin and Effexor. Discontinue Xanax. Klonopin dose was reduced. Monitor.  6.  History of endometrial cancer. Stable. Monitor. Outpatient follow-up.  7.  Reported bedbound status. Patient does not have any pressure sores or ulcers. Patient was  able to sit out of bed at home. Currently severely weak and deconditioned SNF recommended.  Diet: Cardiac diet DVT Prophylaxis: on therapeutic anticoagulation.  Advance goals of care discussion: Full code  Family Communication: no family was present at bedside, at the time of interview.  Try to talk to patient's son on the phone, left voicemail.  No callback.  Disposition:  Discharge to SNF.  Consultants: none Procedures: none  Antibiotics: Anti-infectives (From admission, onward)   None       Objective: Physical Exam: Vitals:   07/06/18 0546 07/06/18 0749 07/06/18 1128 07/06/18 1500  BP: (!) 137/48 (!) 128/50 130/80   Pulse: 98 (!) 106 (!) 102   Resp: (!) 28 19 18    Temp: 99.5 F (37.5 C) (!) 97.5 F (36.4 C) 98 F (36.7 C)   TempSrc: Oral Oral Oral   SpO2: 92% 93%    Weight:    114.6 kg  Height:        Intake/Output Summary (Last 24 hours) at 07/06/2018 1630 Last data filed at 07/06/2018 1400 Gross per 24 hour  Intake 630 ml  Output 330 ml  Net 300 ml   Filed Weights   07/04/18 0300 07/05/18 0500 07/06/18 1500  Weight: 111.4 kg 113.1 kg 114.6 kg   General: Alert, Awake and Oriented to Time, Place and Person. Appear in moderate distress, affect flat Eyes: PERRL, Conjunctiva normal ENT: Oral Mucosa clear moist. Neck: difficult to assess  JVD, no Abnormal Mass Or lumps Cardiovascular: S1 and S2 Present, no Murmur, Peripheral Pulses Present Respiratory: increased respiratory effort, Bilateral Air entry equal and Decreased, no use of accessory muscle,  bilateral  Crackles, no wheezes Abdomen: Bowel Sound present, Soft and no tenderness, no hernia Skin: no redness, no Rash, no induration Extremities: bilateral  Pedal edema, no calf tenderness Neurologic: Grossly no focal neuro deficit. Bilaterally Equal motor strength  Data Reviewed: CBC: Recent Labs  Lab 07/03/18 0247 07/04/18 0402 07/05/18 0258 07/06/18 0321  WBC 11.8* 10.9* 13.1* 11.5*  NEUTROABS  8.9* 7.6  --  8.0*  HGB 12.4 11.8* 11.4* 12.0  HCT 40.4 38.4 37.2 39.1  MCV 97.1 95.3 96.1 94.7  PLT 367 328 317 720   Basic Metabolic Panel: Recent Labs  Lab 07/03/18 0247 07/04/18 0402 07/05/18 0258 07/06/18 0321  NA 142 142 138 139  K 3.1* 2.8* 3.9 3.8  CL 100 99 100 101  CO2 31 32 28 28  GLUCOSE 134* 117* 126* 125*  BUN 7* 12 22 25*  CREATININE 1.03* 1.25* 1.87* 1.56*  CALCIUM 10.2 9.8 9.7 10.0  MG  --   --   --  1.4*    Liver Function Tests: Recent Labs  Lab 07/06/18 0321  AST 10*  ALT 6  ALKPHOS 59  BILITOT 0.7  PROT 6.3*  ALBUMIN 2.7*   No results for input(s): LIPASE, AMYLASE in the last 168 hours. No results for input(s): AMMONIA in the last 168 hours. Coagulation Profile: Recent Labs  Lab 07/03/18 0247  INR 1.13   Cardiac Enzymes: Recent Labs  Lab 07/03/18 1409 07/03/18 2140 07/04/18 0402  TROPONINI 0.03* <0.03 <0.03   BNP (last 3 results) No results for input(s): PROBNP in the last 8760 hours. CBG: Recent Labs  Lab 07/05/18 1215 07/05/18 1727 07/05/18 2152 07/06/18 0747 07/06/18 1146  GLUCAP 129* 130* 114* 119* 158*   Studies: Dg Chest Port 1 View  Result Date: 07/06/2018 CLINICAL DATA:  Short of breath. EXAM: PORTABLE CHEST 1 VIEW COMPARISON:  07/03/2018 FINDINGS: Mild enlargement of the cardiopericardial silhouette. No convincing mediastinal or hilar masses. Prominent bronchovascular markings. Hazy airspace opacities noted in the left mid lung and right upper lung. These findings are stable from the prior chest radiograph. Mild hazy lung base opacities consistent with the small effusions noted on the prior CT. No pneumothorax. IMPRESSION: 1. No significant change in the appearance of the chest radiograph when compared to the most recent prior study. As noted on the prior chest CT, the areas of lung opacity may reflect asymmetric pulmonary edema or infection or inflammation. No new abnormalities. Electronically Signed   By: Lajean Manes  M.D.   On: 07/06/2018 12:58    Scheduled Meds: . apixaban  2.5 mg Oral BID  . aspirin EC  81 mg Oral Daily  . buPROPion  300 mg Oral Daily  . clonazePAM  0.5 mg Oral BID  . ezetimibe-simvastatin  1 tablet Oral QHS  . fluticasone  2 spray Each Nare Daily  . furosemide  40 mg Intravenous BID  . insulin aspart  0-15 Units Subcutaneous TID WC  . insulin aspart  0-5 Units Subcutaneous QHS  . levothyroxine  100 mcg Oral QAC breakfast  . pantoprazole  40 mg Oral Daily  . sodium chloride flush  3 mL Intravenous Q12H  . venlafaxine XR  225 mg Oral Daily   Continuous Infusions: . albumin human    . sodium chloride     PRN Meds: sodium chloride, acetaminophen, clonazePAM, ondansetron (ZOFRAN) IV, sodium chloride flush  Time spent: 35 minutes  Author: Berle Mull, MD [facility] Pager: [phone] 07/06/2018 4:30 PM  If 7PM-7AM, please  contact night-coverage at [email or web address], password [facility]

## 2018-07-07 LAB — CBC
HEMATOCRIT: 34.4 % — AB (ref 36.0–46.0)
HEMOGLOBIN: 10.8 g/dL — AB (ref 12.0–15.0)
MCH: 29.7 pg (ref 26.0–34.0)
MCHC: 31.4 g/dL (ref 30.0–36.0)
MCV: 94.5 fL (ref 78.0–100.0)
Platelets: 301 10*3/uL (ref 150–400)
RBC: 3.64 MIL/uL — ABNORMAL LOW (ref 3.87–5.11)
RDW: 14.9 % (ref 11.5–15.5)
WBC: 11.7 10*3/uL — ABNORMAL HIGH (ref 4.0–10.5)

## 2018-07-07 LAB — BASIC METABOLIC PANEL
Anion gap: 10 (ref 5–15)
BUN: 27 mg/dL — ABNORMAL HIGH (ref 8–23)
CHLORIDE: 99 mmol/L (ref 98–111)
CO2: 29 mmol/L (ref 22–32)
Calcium: 10.2 mg/dL (ref 8.9–10.3)
Creatinine, Ser: 1.53 mg/dL — ABNORMAL HIGH (ref 0.44–1.00)
GFR calc non Af Amer: 32 mL/min — ABNORMAL LOW (ref >60)
GFR, EST AFRICAN AMERICAN: 37 mL/min — AB (ref >60)
Glucose, Bld: 129 mg/dL — ABNORMAL HIGH (ref 70–99)
POTASSIUM: 3.6 mmol/L (ref 3.5–5.1)
SODIUM: 138 mmol/L (ref 135–145)

## 2018-07-07 LAB — GLUCOSE, CAPILLARY
GLUCOSE-CAPILLARY: 139 mg/dL — AB (ref 70–99)
Glucose-Capillary: 128 mg/dL — ABNORMAL HIGH (ref 70–99)
Glucose-Capillary: 119 mg/dL — ABNORMAL HIGH (ref 70–99)
Glucose-Capillary: 113 mg/dL — ABNORMAL HIGH (ref 70–99)

## 2018-07-07 LAB — MAGNESIUM: MAGNESIUM: 1.9 mg/dL (ref 1.7–2.4)

## 2018-07-07 MED ORDER — FUROSEMIDE 40 MG PO TABS
40.0000 mg | ORAL_TABLET | Freq: Two times a day (BID) | ORAL | Status: DC
  Administered 2018-07-07 – 2018-07-08 (×2): 40 mg via ORAL
  Filled 2018-07-07 (×2): qty 1

## 2018-07-07 MED ORDER — POTASSIUM CHLORIDE CRYS ER 20 MEQ PO TBCR
40.0000 meq | EXTENDED_RELEASE_TABLET | Freq: Once | ORAL | Status: AC
  Administered 2018-07-07: 40 meq via ORAL
  Filled 2018-07-07: qty 2

## 2018-07-07 NOTE — Care Management Important Message (Signed)
Important Message  Patient Details  Name: Theresa Blake MRN: [id number] Date of Birth: 05-12-1941   Medicare Important Message Given:  Yes    Zenon Mayo, RN 07/07/2018, 12:19 PM

## 2018-07-07 NOTE — Care Management Note (Addendum)
Case Management Note  Patient Details  Name: VANIAH CHAMBERS MRN: [id number] Date of Birth: 05/12/1941  Subjective/Objective:    Son was at bedside this am , spoke with MD and he would like for patient to go home at dc with [facility] services,  NCM went to room , son had left.  NCM tried to contact his mobile phone, left a vm.  She will also need ambulance transport home at dc.  Will plan for dc tomorrow.  Will need HH orders with face to face and will need ambulance transport forms on chart. Will need to offer choice for [facility] services                   Action/Plan: DC home when ready.  Expected Discharge Date:  07/06/18               Expected Discharge Plan:  [place]  In-House Referral:     Discharge planning Services  CM Consult  Post Acute Care Choice:    Choice offered to:     DME Arranged:    DME Agency:     HH Arranged:    HH Agency:     Status of Service:  In process, will continue to follow  If discussed at Long Length of Stay Meetings, dates discussed:    Additional Comments:  Zenon Mayo, RN 07/07/2018, 11:55 AM

## 2018-07-07 NOTE — Progress Notes (Signed)
[facility] Progress Note  Patient: Theresa Blake MOQ:[id number]   PCP: Tracie Harrier, MD DOB: [birth year]   DOA: 07/03/2018   DOS: 07/07/2018   Date of Service: the patient was seen and examined on 07/07/2018  Subjective: No acute complaint.  No nausea no vomiting.  No fever no chills.  Continent of her urine therefore I's and O's are not adequate.  Brief hospital course: Pt. with PMH of dementia, obesity, immobility and essentially bedbound, uterine cancer, PE, HTN, HLD, type II DM; admitted on 07/03/2018, presented with complaint of shortness of breath, was found to have acute on chronic diastolic CHF. Currently further plan is monitor renal function.  Assessment and Plan: 1.  Acute on chronic hypoxic respiratory failure. Acute on chronic diastolic CHF. Needed oxygen more than her baseline on admission. Currently on 3 L. CT of the chest is positive for groundglass opacity concerning for pulmonary edema. BNP will not be trustworthy in the situation. Echo shows 50 to 55% EF, no wall motion abnormality. Shows moderate RV dysfunction as well.  No TR and normal PASP based on echo. Patient was given IV Lasix given.  Only -1 L during the course of hospitalization although I question the accuracy of ins and outs. In the meantime renal function has increased from 1.03 on admission to 1.87.  Now trending down. Patient was given IV albumin followed by IV Lasix and patient remained stable. We will transition to oral Lasix and monitor overnight.  2.  Hypokalemia. Acute kidney injury. Repleted.  3.  Essential HTN. Blood pressure good. Monitor.  4.  History of PE. On Eliquis. Monitor while the patient's renal function is fluctuating.  5.  Cognitive impairment. Suspect the patient actually has a dementia. Continuing home medication of Wellbutrin and Effexor. Discontinue Xanax. Klonopin dose was reduced. Monitor.  6.  History of endometrial cancer. Stable. Monitor. Outpatient  follow-up.  7.  Reported bedbound status. Patient does not have any pressure sores or ulcers. Patient was able to sit out of bed at home. Currently severely weak and deconditioned SNF recommended. Patient's son who wants to take the patient home.  Home health PT OT ordered.  Diet: Cardiac diet DVT Prophylaxis: on therapeutic anticoagulation.  Advance goals of care discussion: Full code  Family Communication: no family was present at bedside, at the time of interview.  Son was at bedside.  Wants to take the patient home as opposed to SNF.  Home health PT OT ordered.  Disposition:  Discharge to home with home health likely tomorrow on 07/08/2018.  Consultants: none Procedures: none  Antibiotics: Anti-infectives (From admission, onward)   None       Objective: Physical Exam: Vitals:   07/07/18 0400 07/07/18 0700 07/07/18 0739 07/07/18 1301  BP: (!) 102/57  (!) 121/58 106/66  Pulse: (!) 108 (!) 108 (!) 109 87  Resp: 18 20 (!) 21 19  Temp: 98.2 F (36.8 C) 97.7 F (36.5 C) 97.9 F (36.6 C) 98.1 F (36.7 C)  TempSrc: Oral Oral Oral Oral  SpO2: 93%  97% 90%  Weight:      Height:        Intake/Output Summary (Last 24 hours) at 07/07/2018 1407 Last data filed at 07/07/2018 0645 Gross per 24 hour  Intake 200 ml  Output 600 ml  Net -400 ml   Filed Weights   07/04/18 0300 07/05/18 0500 07/06/18 1500  Weight: 111.4 kg 113.1 kg 114.6 kg   General: Alert, Awake and Oriented to Time, Place and  Person. Appear in moderate distress, affect flat Eyes: PERRL, Conjunctiva normal ENT: Oral Mucosa clear moist. Neck: difficult to assess  JVD, no Abnormal Mass Or lumps Cardiovascular: S1 and S2 Present, no Murmur, Peripheral Pulses Present Respiratory: increased respiratory effort, Bilateral Air entry equal and Decreased, no use of accessory muscle, bilateral  Crackles, no wheezes Abdomen: Bowel Sound present, Soft and no tenderness, no hernia Skin: no redness, no Rash, no  induration Extremities: bilateral  Pedal edema, no calf tenderness Neurologic: Grossly no focal neuro deficit. Bilaterally Equal motor strength  Data Reviewed: CBC: Recent Labs  Lab 07/03/18 0247 07/04/18 0402 07/05/18 0258 07/06/18 0321 07/07/18 0247  WBC 11.8* 10.9* 13.1* 11.5* 11.7*  NEUTROABS 8.9* 7.6  --  8.0*  --   HGB 12.4 11.8* 11.4* 12.0 10.8*  HCT 40.4 38.4 37.2 39.1 34.4*  MCV 97.1 95.3 96.1 94.7 94.5  PLT 367 328 317 320 014   Basic Metabolic Panel: Recent Labs  Lab 07/03/18 0247 07/04/18 0402 07/05/18 0258 07/06/18 0321 07/07/18 0247  NA 142 142 138 139 138  K 3.1* 2.8* 3.9 3.8 3.6  CL 100 99 100 101 99  CO2 31 32 28 28 29   GLUCOSE 134* 117* 126* 125* 129*  BUN 7* 12 22 25* 27*  CREATININE 1.03* 1.25* 1.87* 1.56* 1.53*  CALCIUM 10.2 9.8 9.7 10.0 10.2  MG  --   --   --  1.4* 1.9    Liver Function Tests: Recent Labs  Lab 07/06/18 0321  AST 10*  ALT 6  ALKPHOS 59  BILITOT 0.7  PROT 6.3*  ALBUMIN 2.7*   No results for input(s): LIPASE, AMYLASE in the last 168 hours. No results for input(s): AMMONIA in the last 168 hours. Coagulation Profile: Recent Labs  Lab 07/03/18 0247  INR 1.13   Cardiac Enzymes: Recent Labs  Lab 07/03/18 1409 07/03/18 2140 07/04/18 0402  TROPONINI 0.03* <0.03 <0.03   BNP (last 3 results) No results for input(s): PROBNP in the last 8760 hours. CBG: Recent Labs  Lab 07/06/18 1146 07/06/18 1700 07/06/18 2126 07/07/18 0738 07/07/18 1205  GLUCAP 158* 128* 160* 119* 139*   Studies: No results found.  Scheduled Meds: . apixaban  2.5 mg Oral BID  . aspirin EC  81 mg Oral Daily  . buPROPion  300 mg Oral Daily  . clonazePAM  0.5 mg Oral BID  . ezetimibe-simvastatin  1 tablet Oral QHS  . fluticasone  2 spray Each Nare Daily  . furosemide  40 mg Oral BID  . insulin aspart  0-15 Units Subcutaneous TID WC  . insulin aspart  0-5 Units Subcutaneous QHS  . levothyroxine  100 mcg Oral QAC breakfast  . pantoprazole   40 mg Oral Daily  . sodium chloride flush  3 mL Intravenous Q12H  . venlafaxine XR  225 mg Oral Daily   Continuous Infusions: . sodium chloride     PRN Meds: sodium chloride, acetaminophen, clonazePAM, ondansetron (ZOFRAN) IV, sodium chloride flush  Time spent: 35 minutes  Author: Berle Mull, MD [facility] Pager: [phone] 07/07/2018 2:07 PM  If 7PM-7AM, please contact night-coverage at [email or web address], password [facility]

## 2018-07-08 LAB — CBC WITH DIFFERENTIAL/PLATELET
ABS IMMATURE GRANULOCYTES: 0 10*3/uL (ref 0.0–0.1)
BASOS PCT: 1 %
Basophils Absolute: 0.1 10*3/uL (ref 0.0–0.1)
EOS PCT: 4 %
Eosinophils Absolute: 0.4 10*3/uL (ref 0.0–0.7)
HCT: 36.3 % (ref 36.0–46.0)
Hemoglobin: 11.2 g/dL — ABNORMAL LOW (ref 12.0–15.0)
Immature Granulocytes: 0 %
Lymphocytes Relative: 18 %
Lymphs Abs: 1.8 10*3/uL (ref 0.7–4.0)
MCH: 29.2 pg (ref 26.0–34.0)
MCHC: 30.9 g/dL (ref 30.0–36.0)
MCV: 94.8 fL (ref 78.0–100.0)
MONO ABS: 1.2 10*3/uL — AB (ref 0.1–1.0)
MONOS PCT: 12 %
Neutro Abs: 6.8 10*3/uL (ref 1.7–7.7)
Neutrophils Relative %: 65 %
PLATELETS: 317 10*3/uL (ref 150–400)
RBC: 3.83 MIL/uL — ABNORMAL LOW (ref 3.87–5.11)
RDW: 14.7 % (ref 11.5–15.5)
WBC: 10.2 10*3/uL (ref 4.0–10.5)

## 2018-07-08 LAB — COMPREHENSIVE METABOLIC PANEL
ALT: 7 U/L (ref 0–44)
ANION GAP: 8 (ref 5–15)
AST: 10 U/L — ABNORMAL LOW (ref 15–41)
Albumin: 2.9 g/dL — ABNORMAL LOW (ref 3.5–5.0)
Alkaline Phosphatase: 59 U/L (ref 38–126)
BUN: 31 mg/dL — ABNORMAL HIGH (ref 8–23)
CALCIUM: 10.3 mg/dL (ref 8.9–10.3)
CO2: 30 mmol/L (ref 22–32)
Chloride: 99 mmol/L (ref 98–111)
Creatinine, Ser: 1.56 mg/dL — ABNORMAL HIGH (ref 0.44–1.00)
GFR calc Af Amer: 36 mL/min — ABNORMAL LOW (ref >60)
GFR, EST NON AFRICAN AMERICAN: 31 mL/min — AB (ref >60)
GLUCOSE: 148 mg/dL — AB (ref 70–99)
Potassium: 4.7 mmol/L (ref 3.5–5.1)
Sodium: 137 mmol/L (ref 135–145)
TOTAL PROTEIN: 6.3 g/dL — AB (ref 6.5–8.1)
Total Bilirubin: 0.6 mg/dL (ref 0.3–1.2)

## 2018-07-08 LAB — CULTURE, BLOOD (ROUTINE X 2)
CULTURE: NO GROWTH
CULTURE: NO GROWTH
Special Requests: ADEQUATE
Special Requests: ADEQUATE

## 2018-07-08 LAB — GLUCOSE, CAPILLARY
GLUCOSE-CAPILLARY: 158 mg/dL — AB (ref 70–99)
Glucose-Capillary: 158 mg/dL — ABNORMAL HIGH (ref 70–99)
Glucose-Capillary: 127 mg/dL — ABNORMAL HIGH (ref 70–99)

## 2018-07-08 MED ORDER — LEVOTHYROXINE SODIUM 100 MCG PO TABS: 100.0000 ug | ORAL_TABLET | Freq: Every day | ORAL | 0 refills | Status: AC

## 2018-07-08 MED ORDER — FUROSEMIDE 40 MG PO TABS: 40.0000 mg | ORAL_TABLET | ORAL | 0 refills | Status: AC

## 2018-07-08 MED ORDER — ASPIRIN 81 MG PO TBEC: 81.0000 mg | DELAYED_RELEASE_TABLET | Freq: Every day | ORAL | 0 refills | Status: AC

## 2018-07-08 NOTE — Care Management Note (Addendum)
Case Management Note  Patient Details  Name: Theresa Blake MRN: [id number] Date of Birth: 10-24-1941  Subjective/Objective:   NCM spoke with son [name] at bedside, he states he would like to work with [facility], referral made to [facility] with Alvis Lemmings for [place], [place], [place], [place] and Social Work.  Soc will begin 24-48 hrs post dc.  She will need ambulance transport home via [place], address confirmed. Ambulance forms are on chart. NCM contacted [place] , they are on way for transport.                 Action/Plan: DC home when ready.   Expected Discharge Date:  07/06/18               Expected Discharge Plan:  Old Agency  In-House Referral:     Discharge planning Services  CM Consult  Post Acute Care Choice:    Choice offered to:  Adult Children  DME Arranged:    DME Agency:     HH Arranged:  RN, Disease Management, PT, OT, Nurse's Aide, Social Work [place] Agency:  [facility]  Status of Service:  Completed, signed off  If discussed at [employer] of Stay Meetings, dates discussed:    Additional Comments:  Zenon Mayo, RN 07/08/2018, 10:52 AM

## 2018-07-08 NOTE — Discharge Summary (Signed)
[facility] Discharge Summary   Patient: Theresa Blake JEH:[id number]   PCP: Tracie Harrier, MD DOB: 1941-09-11   Date of admission: 07/03/2018   Date of discharge:  07/08/2018    Discharge Diagnoses:  Principal Problem:   Acute on chronic respiratory failure with hypoxia ([place]) Active Problems:   Hypothyroidism   Diabetes mellitus type 2 in obese ([place])   Hyperlipidemia   Essential hypertension   Endometrial cancer ([place])   Acute on chronic diastolic CHF (congestive heart failure) ([place])   Cognitive impairment   Bedbound   Admitted From: home Disposition:  Home with home health(pt and family refused SNF)  Recommendations for Outpatient Follow-up:  1. Please follow up with PCP in 1 week 2. Will need OSA eval  Follow-up Information    Care, [facility] Follow up.   Specialty:  Home Health Services Why:  HHPT, HHRN, [place], [place], Social Worker Contact information: [place] [address] [phone]        Tracie Harrier, MD. Schedule an appointment as soon as possible for a visit in 1 week(s).   Specialty:  Internal Medicine Why:  needs sleep apnea evaluation  Contact information: [place] [address] [phone]          Diet recommendation: cardiac diet  Activity: The patient is advised to gradually reintroduce usual activities.  Discharge Condition: good  Code Status: full code  History of present illness: As per the H and P dictated on admission, "Theresa Blake is a 77 y.o. female with medical history significant of panic d/o; morbid obesity; uterine CA; h/o PE not on AC; HTN; HLD; and DM presenting with SOB.  She has chronic respiratory failure and is on 3L home O2.  Her son apparently found her today and called 49; fire department personnel found her with sats of 78% on room air.  She was placed on a nonrebreather with gradual improvement in sats to 96%.  She is unable  to provide significant history, and this is apparently not new.  She has apparently not been taking anticoagulation.   ED Course: CXR with multifocal PNA vs. Pulmonary edema.  Suspect volume overload.  Given IV Lasix.  On 6L [place] O2.  CT with pulmonary edema."  Hospital Course:  Summary of her active problems in the hospital is as following. 1.  Acute on chronic hypoxic respiratory failure. Acute on chronic diastolic CHF. Needed oxygen more than her baseline on admission. Currently on 3 L. CT of the chest is positive for groundglass opacity concerning for pulmonary edema. BNP will not be trustworthy in the situation. Echo shows 50 to 55% EF, no wall motion abnormality. Shows moderate RV dysfunction as well.  No TR and normal PASP based on echo. Patient was given IV Lasix.   I question the accuracy of ins and outs. In the meantime renal function has increased from 1.03 on admission to 1.87.  Now trending down. Patient was given IV albumin followed by IV Lasix and creatinine remained stable. We will transition to oral Lasix  2.  Hypokalemia. Acute kidney injury. Repleted.  3.  Essential HTN. Blood pressure good. Monitor.  4.  History of PE. On Eliquis. Monitor while the patient's renal function is fluctuating.  5.  Cognitive impairment. Suspect the patient actually has a dementia. Continuing home medication of Wellbutrin and Effexor. Discontinue Xanax. Klonopin dose was reduced resume home dose in discharge. Monitor.  6.  History of  endometrial cancer. Stable. Monitor. Outpatient follow-up.  7.  Reported bedbound status. Patient does not have any pressure sores or ulcers. Patient was able to sit out of bed at home. Currently severely weak and deconditioned SNF recommended. Patient's son who wants to take the patient home.  Home health PT OT ordered.  8. Body mass index is 39.5 kg/m.  Morbid obesity  Recommended OSA eval.   All other chronic medical  condition were stable during the hospitalization.  Patient was seen by physical therapy, who recommended SNF,family refused  Home health was arranged by Education officer, museum and case Freight forwarder. On the day of the discharge the patient's vitals were stable , and no other acute medical condition were reported by patient. the patient was felt safe to be discharge at home with home health.  Consultants: none Procedures: none  DISCHARGE MEDICATION: Allergies as of 07/08/2018      Reactions   Anesthetics, Halogenated Shortness Of Breath   Levaquin [levofloxacin In D5w] Itching   Lamotrigine Itching   Tramadol Rash      Medication List    STOP taking these medications   ALPRAZolam 0.5 MG 24 hr tablet Commonly known as:  XANAX XR   lisinopril 10 MG tablet Commonly known as:  PRINIVIL,ZESTRIL   megestrol 40 MG tablet Commonly known as:  MEGACE   metoprolol tartrate 25 MG tablet Commonly known as:  LOPRESSOR     TAKE these medications   aspirin 81 MG EC tablet Take 1 tablet (81 mg total) by mouth daily. Start taking on:  07/09/2018   buPROPion 150 MG 24 hr tablet Commonly known as:  WELLBUTRIN XL Take 300 mg by mouth daily.   clonazePAM 1 MG tablet Commonly known as:  KLONOPIN Take 1 mg by mouth 2 (two) times daily.   diclofenac sodium 1 % Gel Commonly known as:  VOLTAREN Apply 2 g topically every 8 (eight) hours as needed (lower back pain).   ELIQUIS 2.5 MG Tabs tablet Generic drug:  apixaban Take 2.5 mg by mouth 2 (two) times daily.   ezetimibe-simvastatin 10-20 MG tablet Commonly known as:  VYTORIN Take 1 tablet by mouth at bedtime.   fluticasone 50 MCG/ACT nasal spray Commonly known as:  FLONASE Place 2 sprays into both nostrils daily.   furosemide 40 MG tablet Commonly known as:  LASIX Take 1 tablet (40 mg total) by mouth as directed. 1 tablet Twice a day for 4 days, from 07/12/2018 take one tablet daily. What changed:    medication strength  how much to  take  when to take this  additional instructions   levothyroxine 100 MCG tablet Commonly known as:  SYNTHROID, LEVOTHROID Take 1 tablet (100 mcg total) by mouth daily before breakfast. Start taking on:  07/09/2018 What changed:    medication strength  how much to take   metFORMIN 500 MG tablet Commonly known as:  GLUCOPHAGE Take 500 mg by mouth daily.   multivitamin with minerals Tabs tablet Take 1 tablet by mouth daily.   omeprazole 20 MG capsule Commonly known as:  PRILOSEC TAKE ONE CAPSULE BY MOUTH EVERY DAY *NEEDS OFFICE VISIT* What changed:  See the new instructions.   Venlafaxine HCl 225 MG Tb24 Take 225 mg by mouth daily.            Durable Medical Equipment  (From admission, onward)         Start     Ordered   07/08/18 1135  DME Oxygen  Once  Question Answer Comment  Mode or (Route) Nasal cannula   Liters per Minute 2   Frequency Continuous (stationary and portable oxygen unit needed)   Oxygen delivery system Gas      07/08/18 1134         Allergies  Allergen Reactions  . Anesthetics, Halogenated Shortness Of Breath  . Levaquin [Levofloxacin In D5w] Itching  . Lamotrigine Itching  . Tramadol Rash   Discharge Instructions    Diet - low sodium heart healthy   Complete by:  As directed    Discharge instructions   Complete by:  As directed    It is important that you read following instructions as well as go over your medication list with RN to help you understand your care after this hospitalization.  Discharge Instructions: Please follow-up with PCP in one week  Please request your primary care physician to go over all Hospital Tests and Procedure/Radiological results at the follow up,  Please get all Hospital records sent to your PCP by signing hospital release before you go home.   Do not take more than prescribed Pain, Sleep and Anxiety Medications. You were cared for by a hospitalist during your hospital stay. If you have any  questions about your discharge medications or the care you received while you were in the hospital after you are discharged, you can call the unit and ask to speak with the hospitalist on call if the hospitalist that took care of you is not available.  Once you are discharged, your primary care physician will handle any further medical issues. Please note that NO REFILLS for any discharge medications will be authorized once you are discharged, as it is imperative that you return to your primary care physician (or establish a relationship with a primary care physician if you do not have one) for your aftercare needs so that they can reassess your need for medications and monitor your lab values. You Must read complete instructions/literature along with all the possible adverse reactions/side effects for all the Medicines you take and that have been prescribed to you. Take any new Medicines after you have completely understood and accept all the possible adverse reactions/side effects. Wear Seat belts while driving. If you have smoked or chewed Tobacco in the last 2 yrs please stop smoking and/or stop any Recreational drug use.   Increase activity slowly   Complete by:  As directed      Discharge Exam: Filed Weights   07/05/18 0500 07/06/18 1500 07/08/18 0605  Weight: 113.1 kg 114.6 kg 114.4 kg   Vitals:   07/07/18 2354 07/08/18 0730  BP: (!) 126/50 (!) 134/58  Pulse: (!) 106 (!) 107  Resp: (!) 24 (!) 26  Temp: 98.1 F (36.7 C) (!) 97.5 F (36.4 C)  SpO2: 95% 94%   General: Appear in no distress, no Rash; Oral Mucosa moist. Cardiovascular: S1 and S2 Present, no Murmur, no JVD Respiratory: Bilateral Air entry present and Clear to Auscultation, no Crackles, no wheezes Abdomen: Bowel Sound present, Soft and no tenderness Extremities: no Pedal edema, no calf tenderness Neurology: Grossly no focal neuro deficit. Mild cognitive deficit  The results of significant diagnostics from this  hospitalization (including imaging, microbiology, ancillary and laboratory) are listed below for reference.    Significant Diagnostic Studies: Ct Angio Chest Pe W And/or Wo Contrast  Result Date: 07/03/2018 CLINICAL DATA:  PE suspected, high pretest prob. Respiratory distress. Shortness of breath. History pulmonary embolus. EXAM: CT ANGIOGRAPHY CHEST WITH CONTRAST TECHNIQUE:  Multidetector CT imaging of the chest was performed using the standard protocol during bolus administration of intravenous contrast. Multiplanar CT image reconstructions and MIPs were obtained to evaluate the vascular anatomy. CONTRAST:  103mL ISOVUE-370 IOPAMIDOL (ISOVUE-370) INJECTION 76% COMPARISON:  Radiograph earlier this day.  Chest CT 11/26/2017 FINDINGS: Cardiovascular: Prior pulmonary emboli have resolved. No evidence of new or progressive filling defect in the pulmonary arteries. No acute PE. Aortic atherosclerosis, cannot assess for dissection given phase of contrast. Multi chamber cardiomegaly. There are coronary artery calcifications. No pericardial effusion. Mediastinum/Nodes: Small mediastinal nodes not enlarged by size criteria. No hilar adenopathy. The esophagus is nondistended. Lungs/Pleura: Small bilateral pleural effusions and compressive basilar atelectasis. Diffusely heterogeneous geographic ground-glass opacities throughout both lungs, with scattered interspersed consolidations. Trachea and mainstem bronchi are patent allowing for breathing motion artifact prior. Upper Abdomen: Focal high density in the stomach likely ingested material. No acute findings. Musculoskeletal: There are no acute or suspicious osseous abnormalities. Remote anterior left rib fractures Review of the MIP images confirms the above findings. IMPRESSION: 1. No acute pulmonary embolus. Previous pulmonary emboli have resolved. 2. Geographic ground-glass and consolidative opacities throughout both lungs, differential considerations include pulmonary  edema versus infectious or inflammatory etiologies. There are small bilateral pleural effusions which increase the probability of pulmonary edema and fluid overload as cause of ground-glass opacities. 3. Cardiomegaly with coronary artery calcifications. Aortic Atherosclerosis (ICD10-I70.0). Electronically Signed   By: Keith Rake M.D.   On: 07/03/2018 05:03   Dg Chest Port 1 View  Result Date: 07/06/2018 CLINICAL DATA:  Short of breath. EXAM: PORTABLE CHEST 1 VIEW COMPARISON:  07/03/2018 FINDINGS: Mild enlargement of the cardiopericardial silhouette. No convincing mediastinal or hilar masses. Prominent bronchovascular markings. Hazy airspace opacities noted in the left mid lung and right upper lung. These findings are stable from the prior chest radiograph. Mild hazy lung base opacities consistent with the small effusions noted on the prior CT. No pneumothorax. IMPRESSION: 1. No significant change in the appearance of the chest radiograph when compared to the most recent prior study. As noted on the prior chest CT, the areas of lung opacity may reflect asymmetric pulmonary edema or infection or inflammation. No new abnormalities. Electronically Signed   By: Lajean Manes M.D.   On: 07/06/2018 12:58   Dg Chest Portable 1 View  Result Date: 07/03/2018 CLINICAL DATA:  Shortness of breath tonight, history type II diabetes mellitus, hypertension, endometrial cancer history pulmonary embolism EXAM: PORTABLE CHEST 1 VIEW COMPARISON:  Portable exam 0205 hours compared to 05/23/2018 FINDINGS: Enlargement of cardiac silhouette. Atherosclerotic calcification aorta. BILATERAL pulmonary infiltrates slightly greater on RIGHT which could represent asymmetric pulmonary edema or multifocal infection. No pleural effusion or pneumothorax. Bones demineralized. IMPRESSION: Enlargement of cardiac silhouette. Patchy BILATERAL pulmonary infiltrates question asymmetric pulmonary edema versus multifocal pneumonia. Electronically  Signed   By: Lavonia Dana M.D.   On: 07/03/2018 02:21    Microbiology: Recent Results (from the past 240 hour(s))  Culture, blood (Routine x 2)     Status: None   Collection Time: 07/03/18  2:47 AM  Result Value Ref Range Status   Specimen Description BLOOD RIGHT WRIST  Final   Special Requests   Final    BOTTLES DRAWN AEROBIC AND ANAEROBIC Blood Culture adequate volume   Culture   Final    NO GROWTH 5 DAYS Performed at [facility], [address] [place], [place] [address]    Report Status 07/08/2018 FINAL  Final  Culture, blood (Routine  x 2)     Status: None   Collection Time: 07/03/18  2:52 AM  Result Value Ref Range Status   Specimen Description BLOOD LEFT ANTECUBITAL  Final   Special Requests   Final    BOTTLES DRAWN AEROBIC AND ANAEROBIC Blood Culture adequate volume   Culture   Final    NO GROWTH 5 DAYS Performed at [facility], [address], [place], [place] [address]    Report Status 07/08/2018 FINAL  Final  MRSA PCR Screening     Status: Abnormal   Collection Time: 07/03/18  2:07 PM  Result Value Ref Range Status   MRSA by PCR POSITIVE (A) NEGATIVE Final    Comment:        The GeneXpert MRSA Assay (FDA approved for NASAL specimens only), is one component of a comprehensive MRSA colonization surveillance program. It is not intended to diagnose MRSA infection nor to guide or monitor treatment for MRSA infections. RESULT CALLED TO, READ BACK BY AND VERIFIED WITH: Vevelyn Pat RN 07/03/18 1803 JDW Performed at [facility], [place] [address], [place], [place] [address]      Labs: CBC: Recent Labs  Lab 07/03/18 0247 07/04/18 0402 07/05/18 0258 07/06/18 0321 07/07/18 0247 07/08/18 0330  WBC 11.8* 10.9* 13.1* 11.5* 11.7* 10.2  NEUTROABS 8.9* 7.6  --  8.0*  --  6.8  HGB 12.4 11.8* 11.4* 12.0 10.8* 11.2*  HCT 40.4 38.4 37.2 39.1 34.4* 36.3  MCV 97.1 95.3 96.1 94.7 94.5 94.8  PLT 367 328 317 320 301 030   Basic Metabolic Panel: Recent Labs   Lab 07/04/18 0402 07/05/18 0258 07/06/18 0321 07/07/18 0247 07/08/18 0330  NA 142 138 139 138 137  K 2.8* 3.9 3.8 3.6 4.7  CL 99 100 101 99 99  CO2 32 28 28 29 30   GLUCOSE 117* 126* 125* 129* 148*  BUN 12 22 25* 27* 31*  CREATININE 1.25* 1.87* 1.56* 1.53* 1.56*  CALCIUM 9.8 9.7 10.0 10.2 10.3  MG  --   --  1.4* 1.9  --    Liver Function Tests: Recent Labs  Lab 07/06/18 0321 07/08/18 0330  AST 10* 10*  ALT 6 7  ALKPHOS 59 59  BILITOT 0.7 0.6  PROT 6.3* 6.3*  ALBUMIN 2.7* 2.9*   No results for input(s): LIPASE, AMYLASE in the last 168 hours. No results for input(s): AMMONIA in the last 168 hours. Cardiac Enzymes: Recent Labs  Lab 07/03/18 1409 07/03/18 2140 07/04/18 0402  TROPONINI 0.03* <0.03 <0.03   BNP (last 3 results) Recent Labs    11/27/17 0005 07/03/18 0247  BNP 51.4 1,091.5*   CBG: Recent Labs  Lab 07/07/18 1205 07/07/18 1625 07/07/18 2126 07/08/18 0728 07/08/18 1104  GLUCAP 139* 113* 158* 127* 158*   Time spent: 35 minutes  Signed:  Berle Mull  [facility]  07/08/2018  , 12:51 PM

## 2018-07-13 NOTE — Progress Notes (Signed)
Appointment canceled This encounter was created in error - please disregard.

## 2018-10-21 ENCOUNTER — Emergency Department (HOSPITAL_COMMUNITY): Payer: Medicare Other

## 2018-10-21 ENCOUNTER — Inpatient Hospital Stay (HOSPITAL_COMMUNITY): Payer: Medicare Other

## 2018-10-21 ENCOUNTER — Inpatient Hospital Stay (HOSPITAL_COMMUNITY)
Admission: EM | Admit: 2018-10-21 | Discharge: 2018-10-31 | DRG: 659 | Disposition: A | Discharge status: Hospice | Payer: Medicare Other | Attending: Internal Medicine | Admitting: Internal Medicine

## 2018-10-21 ENCOUNTER — Inpatient Hospital Stay (HOSPITAL_COMMUNITY): Payer: Medicare Other | Admitting: Anesthesiology

## 2018-10-21 ENCOUNTER — Encounter (HOSPITAL_COMMUNITY)
Admission: EM | Disposition: A | Discharge status: Hospice | Payer: Self-pay | Source: Home / Self Care | Attending: Internal Medicine

## 2018-10-21 ENCOUNTER — Other Ambulatory Visit: Payer: Self-pay

## 2018-10-21 ENCOUNTER — Encounter (HOSPITAL_COMMUNITY): Payer: Self-pay | Admitting: Emergency Medicine

## 2018-10-21 DIAGNOSIS — C541 Malignant neoplasm of endometrium: Secondary | ICD-10-CM | POA: Diagnosis not present

## 2018-10-21 DIAGNOSIS — C7800 Secondary malignant neoplasm of unspecified lung: Secondary | ICD-10-CM | POA: Diagnosis present

## 2018-10-21 DIAGNOSIS — Z6841 Body Mass Index (BMI) 40.0 and over, adult: Secondary | ICD-10-CM | POA: Diagnosis not present

## 2018-10-21 DIAGNOSIS — Z7189 Other specified counseling: Secondary | ICD-10-CM | POA: Diagnosis not present

## 2018-10-21 DIAGNOSIS — N131 Hydronephrosis with ureteral stricture, not elsewhere classified: Secondary | ICD-10-CM | POA: Diagnosis present

## 2018-10-21 DIAGNOSIS — F418 Other specified anxiety disorders: Secondary | ICD-10-CM | POA: Diagnosis present

## 2018-10-21 DIAGNOSIS — Z8249 Family history of ischemic heart disease and other diseases of the circulatory system: Secondary | ICD-10-CM | POA: Diagnosis not present

## 2018-10-21 DIAGNOSIS — F05 Delirium due to known physiological condition: Secondary | ICD-10-CM | POA: Diagnosis present

## 2018-10-21 DIAGNOSIS — Z800 Family history of malignant neoplasm of digestive organs: Secondary | ICD-10-CM

## 2018-10-21 DIAGNOSIS — E1122 Type 2 diabetes mellitus with diabetic chronic kidney disease: Secondary | ICD-10-CM | POA: Diagnosis present

## 2018-10-21 DIAGNOSIS — E785 Hyperlipidemia, unspecified: Secondary | ICD-10-CM | POA: Diagnosis present

## 2018-10-21 DIAGNOSIS — Z7982 Long term (current) use of aspirin: Secondary | ICD-10-CM

## 2018-10-21 DIAGNOSIS — Z885 Allergy status to narcotic agent status: Secondary | ICD-10-CM

## 2018-10-21 DIAGNOSIS — R410 Disorientation, unspecified: Secondary | ICD-10-CM

## 2018-10-21 DIAGNOSIS — Z90710 Acquired absence of both cervix and uterus: Secondary | ICD-10-CM

## 2018-10-21 DIAGNOSIS — E039 Hypothyroidism, unspecified: Secondary | ICD-10-CM | POA: Diagnosis present

## 2018-10-21 DIAGNOSIS — I959 Hypotension, unspecified: Secondary | ICD-10-CM | POA: Diagnosis present

## 2018-10-21 DIAGNOSIS — E669 Obesity, unspecified: Secondary | ICD-10-CM | POA: Diagnosis present

## 2018-10-21 DIAGNOSIS — E872 Acidosis: Secondary | ICD-10-CM | POA: Diagnosis present

## 2018-10-21 DIAGNOSIS — N1330 Unspecified hydronephrosis: Secondary | ICD-10-CM | POA: Diagnosis present

## 2018-10-21 DIAGNOSIS — J440 Chronic obstructive pulmonary disease with acute lower respiratory infection: Secondary | ICD-10-CM | POA: Diagnosis present

## 2018-10-21 DIAGNOSIS — Z86718 Personal history of other venous thrombosis and embolism: Secondary | ICD-10-CM

## 2018-10-21 DIAGNOSIS — R4182 Altered mental status, unspecified: Secondary | ICD-10-CM | POA: Diagnosis present

## 2018-10-21 DIAGNOSIS — Z7401 Bed confinement status: Secondary | ICD-10-CM

## 2018-10-21 DIAGNOSIS — R54 Age-related physical debility: Secondary | ICD-10-CM | POA: Diagnosis present

## 2018-10-21 DIAGNOSIS — J189 Pneumonia, unspecified organism: Secondary | ICD-10-CM | POA: Diagnosis present

## 2018-10-21 DIAGNOSIS — G928 Other toxic encephalopathy: Secondary | ICD-10-CM | POA: Diagnosis present

## 2018-10-21 DIAGNOSIS — Z881 Allergy status to other antibiotic agents status: Secondary | ICD-10-CM

## 2018-10-21 DIAGNOSIS — N183 Chronic kidney disease, stage 3 (moderate): Secondary | ICD-10-CM | POA: Diagnosis present

## 2018-10-21 DIAGNOSIS — Z888 Allergy status to other drugs, medicaments and biological substances status: Secondary | ICD-10-CM

## 2018-10-21 DIAGNOSIS — Z8542 Personal history of malignant neoplasm of other parts of uterus: Secondary | ICD-10-CM

## 2018-10-21 DIAGNOSIS — J9611 Chronic respiratory failure with hypoxia: Secondary | ICD-10-CM | POA: Diagnosis present

## 2018-10-21 DIAGNOSIS — Z7984 Long term (current) use of oral hypoglycemic drugs: Secondary | ICD-10-CM

## 2018-10-21 DIAGNOSIS — Z66 Do not resuscitate: Secondary | ICD-10-CM | POA: Diagnosis not present

## 2018-10-21 DIAGNOSIS — N189 Chronic kidney disease, unspecified: Secondary | ICD-10-CM

## 2018-10-21 DIAGNOSIS — Z9981 Dependence on supplemental oxygen: Secondary | ICD-10-CM

## 2018-10-21 DIAGNOSIS — Z79899 Other long term (current) drug therapy: Secondary | ICD-10-CM

## 2018-10-21 DIAGNOSIS — R0602 Shortness of breath: Secondary | ICD-10-CM

## 2018-10-21 DIAGNOSIS — E11649 Type 2 diabetes mellitus with hypoglycemia without coma: Secondary | ICD-10-CM | POA: Diagnosis present

## 2018-10-21 DIAGNOSIS — N179 Acute kidney failure, unspecified: Principal | ICD-10-CM | POA: Diagnosis present

## 2018-10-21 DIAGNOSIS — I129 Hypertensive chronic kidney disease with stage 1 through stage 4 chronic kidney disease, or unspecified chronic kidney disease: Secondary | ICD-10-CM | POA: Diagnosis present

## 2018-10-21 DIAGNOSIS — Z7901 Long term (current) use of anticoagulants: Secondary | ICD-10-CM

## 2018-10-21 DIAGNOSIS — G92 Toxic encephalopathy: Secondary | ICD-10-CM | POA: Diagnosis present

## 2018-10-21 DIAGNOSIS — Z8349 Family history of other endocrine, nutritional and metabolic diseases: Secondary | ICD-10-CM

## 2018-10-21 DIAGNOSIS — Z86711 Personal history of pulmonary embolism: Secondary | ICD-10-CM

## 2018-10-21 DIAGNOSIS — Z7951 Long term (current) use of inhaled steroids: Secondary | ICD-10-CM

## 2018-10-21 DIAGNOSIS — K219 Gastro-esophageal reflux disease without esophagitis: Secondary | ICD-10-CM | POA: Diagnosis present

## 2018-10-21 DIAGNOSIS — I10 Essential (primary) hypertension: Secondary | ICD-10-CM | POA: Diagnosis present

## 2018-10-21 DIAGNOSIS — E1169 Type 2 diabetes mellitus with other specified complication: Secondary | ICD-10-CM | POA: Diagnosis present

## 2018-10-21 DIAGNOSIS — Z515 Encounter for palliative care: Secondary | ICD-10-CM | POA: Diagnosis not present

## 2018-10-21 DIAGNOSIS — E875 Hyperkalemia: Secondary | ICD-10-CM | POA: Diagnosis present

## 2018-10-21 DIAGNOSIS — Z923 Personal history of irradiation: Secondary | ICD-10-CM

## 2018-10-21 DIAGNOSIS — Z79890 Hormone replacement therapy (postmenopausal): Secondary | ICD-10-CM

## 2018-10-21 HISTORY — PX: CYSTOSCOPY W/ URETERAL STENT PLACEMENT: SHX1429

## 2018-10-21 LAB — RENAL FUNCTION PANEL
ANION GAP: 18 — AB (ref 5–15)
Albumin: 2.5 g/dL — ABNORMAL LOW (ref 3.5–5.0)
BUN: 93 mg/dL — ABNORMAL HIGH (ref 8–23)
CO2: 14 mmol/L — ABNORMAL LOW (ref 22–32)
Calcium: 9.8 mg/dL (ref 8.9–10.3)
Chloride: 104 mmol/L (ref 98–111)
Creatinine, Ser: 9.02 mg/dL — ABNORMAL HIGH (ref 0.44–1.00)
GFR calc Af Amer: 4 mL/min — ABNORMAL LOW (ref >60)
GFR calc non Af Amer: 4 mL/min — ABNORMAL LOW (ref >60)
Glucose, Bld: 144 mg/dL — ABNORMAL HIGH (ref 70–99)
Phosphorus: 5.9 mg/dL — ABNORMAL HIGH (ref 2.5–4.6)
Potassium: 5 mmol/L (ref 3.5–5.1)
Sodium: 136 mmol/L (ref 135–145)

## 2018-10-21 LAB — CBC WITH DIFFERENTIAL/PLATELET
Abs Immature Granulocytes: 0.05 10*3/uL (ref 0.00–0.07)
BASOS PCT: 1 %
Basophils Absolute: 0.1 10*3/uL (ref 0.0–0.1)
Eosinophils Absolute: 0.5 10*3/uL (ref 0.0–0.5)
Eosinophils Relative: 6 %
HCT: 34 % — ABNORMAL LOW (ref 36.0–46.0)
Hemoglobin: 10.7 g/dL — ABNORMAL LOW (ref 12.0–15.0)
Immature Granulocytes: 1 %
Lymphocytes Relative: 15 %
Lymphs Abs: 1.4 10*3/uL (ref 0.7–4.0)
MCH: 28.9 pg (ref 26.0–34.0)
MCHC: 31.5 g/dL (ref 30.0–36.0)
MCV: 91.9 fL (ref 80.0–100.0)
Monocytes Absolute: 1 10*3/uL (ref 0.1–1.0)
Monocytes Relative: 11 %
Neutro Abs: 6.2 10*3/uL (ref 1.7–7.7)
Neutrophils Relative %: 66 %
PLATELETS: 375 10*3/uL (ref 150–400)
RBC: 3.7 MIL/uL — ABNORMAL LOW (ref 3.87–5.11)
RDW: 17.8 % — ABNORMAL HIGH (ref 11.5–15.5)
WBC: 9.2 10*3/uL (ref 4.0–10.5)
nRBC: 0 % (ref 0.0–0.2)

## 2018-10-21 LAB — BASIC METABOLIC PANEL
Anion gap: 15 (ref 5–15)
BUN: 89 mg/dL — ABNORMAL HIGH (ref 8–23)
CO2: 14 mmol/L — ABNORMAL LOW (ref 22–32)
Calcium: 9.4 mg/dL (ref 8.9–10.3)
Chloride: 105 mmol/L (ref 98–111)
Creatinine, Ser: 8.62 mg/dL — ABNORMAL HIGH (ref 0.44–1.00)
GFR calc Af Amer: 5 mL/min — ABNORMAL LOW (ref >60)
GFR calc non Af Amer: 4 mL/min — ABNORMAL LOW (ref >60)
Glucose, Bld: 164 mg/dL — ABNORMAL HIGH (ref 70–99)
Potassium: 5.1 mmol/L (ref 3.5–5.1)
SODIUM: 134 mmol/L — AB (ref 135–145)

## 2018-10-21 LAB — COMPREHENSIVE METABOLIC PANEL
ALBUMIN: 2.8 g/dL — AB (ref 3.5–5.0)
ALT: 5 U/L (ref 0–44)
ANION GAP: 17 — AB (ref 5–15)
AST: 8 U/L — AB (ref 15–41)
Alkaline Phosphatase: 77 U/L (ref 38–126)
BUN: 98 mg/dL — AB (ref 8–23)
CHLORIDE: 102 mmol/L (ref 98–111)
CO2: 15 mmol/L — ABNORMAL LOW (ref 22–32)
Calcium: 10 mg/dL (ref 8.9–10.3)
Creatinine, Ser: 9.39 mg/dL — ABNORMAL HIGH (ref 0.44–1.00)
GFR calc Af Amer: 4 mL/min — ABNORMAL LOW (ref >60)
GFR calc non Af Amer: 4 mL/min — ABNORMAL LOW (ref >60)
GLUCOSE: 75 mg/dL (ref 70–99)
POTASSIUM: 6 mmol/L — AB (ref 3.5–5.1)
Sodium: 134 mmol/L — ABNORMAL LOW (ref 135–145)
Total Bilirubin: 0.5 mg/dL (ref 0.3–1.2)
Total Protein: 6.8 g/dL (ref 6.5–8.1)

## 2018-10-21 LAB — CBG MONITORING, ED
GLUCOSE-CAPILLARY: 74 mg/dL (ref 70–99)
GLUCOSE-CAPILLARY: 126 mg/dL — AB (ref 70–99)
Glucose-Capillary: 67 mg/dL — ABNORMAL LOW (ref 70–99)
Glucose-Capillary: 142 mg/dL — ABNORMAL HIGH (ref 70–99)

## 2018-10-21 LAB — URINALYSIS, ROUTINE W REFLEX MICROSCOPIC
Bilirubin Urine: NEGATIVE
Glucose, UA: NEGATIVE mg/dL
Ketones, ur: NEGATIVE mg/dL
Leukocytes, UA: NEGATIVE
Nitrite: NEGATIVE
Protein, ur: 30 mg/dL — AB
SPECIFIC GRAVITY, URINE: 1.011 (ref 1.005–1.030)
pH: 6 (ref 5.0–8.0)

## 2018-10-21 LAB — GLUCOSE, CAPILLARY
Glucose-Capillary: 155 mg/dL — ABNORMAL HIGH (ref 70–99)
Glucose-Capillary: 156 mg/dL — ABNORMAL HIGH (ref 70–99)
Glucose-Capillary: 189 mg/dL — ABNORMAL HIGH (ref 70–99)
Glucose-Capillary: 99 mg/dL (ref 70–99)

## 2018-10-21 LAB — I-STAT CG4 LACTIC ACID, ED
LACTIC ACID, VENOUS: 1.11 mmol/L (ref 0.5–1.9)
Lactic Acid, Venous: 4.75 mmol/L (ref 0.5–1.9)

## 2018-10-21 LAB — CK: Total CK: 18 U/L — ABNORMAL LOW (ref 38–234)

## 2018-10-21 LAB — LACTIC ACID, PLASMA: LACTIC ACID, VENOUS: 2.9 mmol/L — AB (ref 0.5–1.9)

## 2018-10-21 LAB — BRAIN NATRIURETIC PEPTIDE: B Natriuretic Peptide: 1250.6 pg/mL — ABNORMAL HIGH (ref 0.0–100.0)

## 2018-10-21 LAB — TROPONIN I: Troponin I: 0.1 ng/mL (ref <0.03)

## 2018-10-21 LAB — LIPASE, BLOOD: Lipase: 44 U/L (ref 11–51)

## 2018-10-21 SURGERY — CYSTOSCOPY, WITH RETROGRADE PYELOGRAM AND URETERAL STENT INSERTION
Anesthesia: General | Site: Ureter | Laterality: Bilateral

## 2018-10-21 MED ORDER — STERILE WATER FOR IRRIGATION IR SOLN
Status: DC | PRN
  Administered 2018-10-21: 3000 mL

## 2018-10-21 MED ORDER — VENLAFAXINE HCL ER 75 MG PO CP24
225.0000 mg | ORAL_CAPSULE | Freq: Every day | ORAL | Status: DC
  Administered 2018-10-22 – 2018-10-24 (×3): 225 mg via ORAL
  Filled 2018-10-21 (×4): qty 3

## 2018-10-21 MED ORDER — ONDANSETRON HCL 4 MG/2ML IJ SOLN: 4.0000 mg | Freq: Four times a day (QID) | INTRAMUSCULAR | Status: DC | PRN

## 2018-10-21 MED ORDER — FENTANYL CITRATE (PF) 100 MCG/2ML IJ SOLN
INTRAMUSCULAR | Status: DC | PRN
  Administered 2018-10-21 (×2): 25 ug via INTRAVENOUS

## 2018-10-21 MED ORDER — ROCURONIUM BROMIDE 50 MG/5ML IV SOSY
PREFILLED_SYRINGE | INTRAVENOUS | Status: DC | PRN
  Administered 2018-10-21: 25 mg via INTRAVENOUS

## 2018-10-21 MED ORDER — CALCIUM GLUCONATE-NACL 1-0.675 GM/50ML-% IV SOLN
1.0000 g | Freq: Once | INTRAVENOUS | Status: AC
  Administered 2018-10-21: 1000 mg via INTRAVENOUS
  Filled 2018-10-21: qty 50

## 2018-10-21 MED ORDER — SODIUM CHLORIDE 0.9 % IV SOLN
INTRAVENOUS | Status: DC
  Administered 2018-10-21: 19:00:00 via INTRAVENOUS

## 2018-10-21 MED ORDER — SODIUM CHLORIDE 0.9 % IV SOLN
500.0000 mg | INTRAVENOUS | Status: DC
  Administered 2018-10-22 – 2018-10-28 (×7): 500 mg via INTRAVENOUS
  Filled 2018-10-21 (×7): qty 500

## 2018-10-21 MED ORDER — LEVOTHYROXINE SODIUM 100 MCG PO TABS
100.0000 ug | ORAL_TABLET | Freq: Every day | ORAL | Status: DC
  Administered 2018-10-22 – 2018-10-31 (×10): 100 ug via ORAL
  Filled 2018-10-21 (×10): qty 1

## 2018-10-21 MED ORDER — BUPROPION HCL ER (XL) 150 MG PO TB24
300.0000 mg | ORAL_TABLET | Freq: Every day | ORAL | Status: DC
  Administered 2018-10-22: 300 mg via ORAL
  Filled 2018-10-21: qty 2

## 2018-10-21 MED ORDER — ACETAMINOPHEN 650 MG RE SUPP: 650.0000 mg | Freq: Four times a day (QID) | RECTAL | Status: DC | PRN

## 2018-10-21 MED ORDER — PROPOFOL 10 MG/ML IV BOLUS
INTRAVENOUS | Status: AC
  Filled 2018-10-21: qty 20

## 2018-10-21 MED ORDER — SODIUM CHLORIDE 0.9 % IV SOLN: 1.0000 g | Freq: Once | INTRAVENOUS | Status: DC

## 2018-10-21 MED ORDER — ONDANSETRON HCL 4 MG PO TABS: 4.0000 mg | ORAL_TABLET | Freq: Four times a day (QID) | ORAL | Status: DC | PRN

## 2018-10-21 MED ORDER — ASPIRIN 81 MG PO CHEW
81.0000 mg | CHEWABLE_TABLET | Freq: Every day | ORAL | Status: DC
  Administered 2018-10-22 – 2018-10-28 (×7): 81 mg via ORAL
  Filled 2018-10-21 (×7): qty 1

## 2018-10-21 MED ORDER — PANTOPRAZOLE SODIUM 40 MG PO TBEC
40.0000 mg | DELAYED_RELEASE_TABLET | Freq: Every day | ORAL | Status: DC
  Administered 2018-10-22 – 2018-10-31 (×10): 40 mg via ORAL
  Filled 2018-10-21 (×10): qty 1

## 2018-10-21 MED ORDER — IOPAMIDOL (ISOVUE-300) INJECTION 61%
INTRAVENOUS | Status: AC
  Filled 2018-10-21: qty 50

## 2018-10-21 MED ORDER — ONDANSETRON HCL 4 MG/2ML IJ SOLN
INTRAMUSCULAR | Status: DC | PRN
  Administered 2018-10-21: 4 mg via INTRAVENOUS

## 2018-10-21 MED ORDER — SODIUM CHLORIDE 0.9 % IV BOLUS
500.0000 mL | Freq: Once | INTRAVENOUS | Status: AC
  Administered 2018-10-21: 500 mL via INTRAVENOUS

## 2018-10-21 MED ORDER — ALBUTEROL (5 MG/ML) CONTINUOUS INHALATION SOLN
10.0000 mg/h | INHALATION_SOLUTION | RESPIRATORY_TRACT | Status: DC
  Administered 2018-10-21: 10 mg/h via RESPIRATORY_TRACT
  Filled 2018-10-21: qty 20

## 2018-10-21 MED ORDER — INSULIN ASPART 100 UNIT/ML IV SOLN
5.0000 [IU] | Freq: Once | INTRAVENOUS | Status: AC
  Administered 2018-10-21: 5 [IU] via INTRAVENOUS

## 2018-10-21 MED ORDER — ACETAMINOPHEN 325 MG PO TABS: 650.0000 mg | ORAL_TABLET | Freq: Four times a day (QID) | ORAL | Status: DC | PRN

## 2018-10-21 MED ORDER — SODIUM CHLORIDE 0.9% FLUSH
3.0000 mL | Freq: Two times a day (BID) | INTRAVENOUS | Status: DC
  Administered 2018-10-21: 3 mL via INTRAVENOUS

## 2018-10-21 MED ORDER — FENTANYL CITRATE (PF) 250 MCG/5ML IJ SOLN
INTRAMUSCULAR | Status: AC
  Filled 2018-10-21: qty 5

## 2018-10-21 MED ORDER — DEXTROSE 50 % IV SOLN
1.0000 | Freq: Once | INTRAVENOUS | Status: AC
  Administered 2018-10-21: 50 mL via INTRAVENOUS
  Filled 2018-10-21: qty 50

## 2018-10-21 MED ORDER — SODIUM CHLORIDE 0.9 % IV BOLUS
1000.0000 mL | Freq: Once | INTRAVENOUS | Status: AC
  Administered 2018-10-21: 1000 mL via INTRAVENOUS

## 2018-10-21 MED ORDER — SODIUM CHLORIDE 0.9 % IV SOLN: INTRAVENOUS | Status: DC

## 2018-10-21 MED ORDER — SODIUM CHLORIDE 0.9 % IV SOLN
250.0000 mL | INTRAVENOUS | Status: DC | PRN
  Administered 2018-10-21: 15:00:00 via INTRAVENOUS

## 2018-10-21 MED ORDER — SODIUM CHLORIDE 0.9 % IV SOLN
1.0000 g | INTRAVENOUS | Status: DC
  Administered 2018-10-22 – 2018-10-28 (×7): 1 g via INTRAVENOUS
  Filled 2018-10-21 (×7): qty 10

## 2018-10-21 MED ORDER — ROCURONIUM BROMIDE 50 MG/5ML IV SOSY
PREFILLED_SYRINGE | INTRAVENOUS | Status: AC
  Filled 2018-10-21: qty 5

## 2018-10-21 MED ORDER — 0.9 % SODIUM CHLORIDE (POUR BTL) OPTIME
TOPICAL | Status: DC | PRN
  Administered 2018-10-21: 1000 mL

## 2018-10-21 MED ORDER — POLYETHYLENE GLYCOL 3350 17 G PO PACK
17.0000 g | PACK | Freq: Every day | ORAL | Status: DC | PRN
  Administered 2018-10-27: 17 g via ORAL
  Filled 2018-10-21: qty 1

## 2018-10-21 MED ORDER — ADULT MULTIVITAMIN W/MINERALS CH
1.0000 | ORAL_TABLET | Freq: Every day | ORAL | Status: DC
  Administered 2018-10-22 – 2018-10-31 (×10): 1 via ORAL
  Filled 2018-10-21 (×10): qty 1

## 2018-10-21 MED ORDER — APIXABAN 2.5 MG PO TABS
2.5000 mg | ORAL_TABLET | Freq: Two times a day (BID) | ORAL | Status: DC
  Administered 2018-10-21 – 2018-10-22 (×2): 2.5 mg via ORAL
  Filled 2018-10-21 (×2): qty 1

## 2018-10-21 MED ORDER — CLONAZEPAM 1 MG PO TABS
1.0000 mg | ORAL_TABLET | Freq: Two times a day (BID) | ORAL | Status: DC
  Administered 2018-10-21 – 2018-10-22 (×3): 1 mg via ORAL
  Filled 2018-10-21 (×4): qty 1

## 2018-10-21 MED ORDER — SODIUM BICARBONATE 8.4 % IV SOLN
50.0000 meq | Freq: Once | INTRAVENOUS | Status: AC
  Administered 2018-10-21: 50 meq via INTRAVENOUS
  Filled 2018-10-21: qty 50

## 2018-10-21 MED ORDER — PROPOFOL 10 MG/ML IV BOLUS
INTRAVENOUS | Status: DC | PRN
  Administered 2018-10-21: 90 mg via INTRAVENOUS

## 2018-10-21 MED ORDER — ALBUTEROL SULFATE (2.5 MG/3ML) 0.083% IN NEBU: 2.5000 mg | INHALATION_SOLUTION | RESPIRATORY_TRACT | Status: DC | PRN

## 2018-10-21 MED ORDER — SUGAMMADEX SODIUM 200 MG/2ML IV SOLN
INTRAVENOUS | Status: DC | PRN
  Administered 2018-10-21: 200 mg via INTRAVENOUS

## 2018-10-21 MED ORDER — DEXTROSE 10 % IV SOLN
Freq: Once | INTRAVENOUS | Status: AC
  Administered 2018-10-21: 08:00:00 via INTRAVENOUS

## 2018-10-21 MED ORDER — LIDOCAINE 2% (20 MG/ML) 5 ML SYRINGE
INTRAMUSCULAR | Status: AC
  Filled 2018-10-21: qty 5

## 2018-10-21 MED ORDER — TRAZODONE HCL 50 MG PO TABS
50.0000 mg | ORAL_TABLET | Freq: Every evening | ORAL | Status: DC | PRN
  Administered 2018-10-21 – 2018-10-29 (×5): 50 mg via ORAL
  Filled 2018-10-21 (×5): qty 1

## 2018-10-21 MED ORDER — FLUTICASONE PROPIONATE 50 MCG/ACT NA SUSP
2.0000 | Freq: Every day | NASAL | Status: DC
  Administered 2018-10-22 – 2018-10-31 (×10): 2 via NASAL
  Filled 2018-10-21: qty 16

## 2018-10-21 MED ORDER — ALBUTEROL SULFATE (2.5 MG/3ML) 0.083% IN NEBU
10.0000 mg | INHALATION_SOLUTION | Freq: Once | RESPIRATORY_TRACT | Status: DC
  Filled 2018-10-21: qty 12

## 2018-10-21 MED ORDER — SODIUM CHLORIDE 0.9% FLUSH: 3.0000 mL | INTRAVENOUS | Status: DC | PRN

## 2018-10-21 MED ORDER — EZETIMIBE-SIMVASTATIN 10-20 MG PO TABS
1.0000 | ORAL_TABLET | Freq: Every day | ORAL | Status: DC
  Administered 2018-10-21 – 2018-10-28 (×8): 1 via ORAL
  Filled 2018-10-21 (×9): qty 1

## 2018-10-21 MED ORDER — SODIUM CHLORIDE 0.9 % IV SOLN
1.0000 g | Freq: Once | INTRAVENOUS | Status: AC
  Administered 2018-10-21: 1 g via INTRAVENOUS
  Filled 2018-10-21: qty 10

## 2018-10-21 MED ORDER — IOPAMIDOL (ISOVUE-300) INJECTION 61%
INTRAVENOUS | Status: DC | PRN
  Administered 2018-10-21: 20 mL via INTRAVENOUS

## 2018-10-21 MED ORDER — SODIUM CHLORIDE 0.9 % IV SOLN: INTRAVENOUS | Status: DC | PRN

## 2018-10-21 MED ORDER — DEXAMETHASONE SODIUM PHOSPHATE 10 MG/ML IJ SOLN
INTRAMUSCULAR | Status: AC
  Filled 2018-10-21: qty 1

## 2018-10-21 MED ORDER — SODIUM CHLORIDE 0.9 % IV SOLN
500.0000 mg | Freq: Once | INTRAVENOUS | Status: AC
  Administered 2018-10-21: 500 mg via INTRAVENOUS
  Filled 2018-10-21: qty 500

## 2018-10-21 MED ORDER — SODIUM POLYSTYRENE SULFONATE 15 GM/60ML PO SUSP
45.0000 g | Freq: Once | ORAL | Status: DC
  Filled 2018-10-21: qty 180

## 2018-10-21 MED ORDER — ONDANSETRON HCL 4 MG/2ML IJ SOLN
INTRAMUSCULAR | Status: AC
  Filled 2018-10-21: qty 2

## 2018-10-21 MED ORDER — LIDOCAINE 2% (20 MG/ML) 5 ML SYRINGE
INTRAMUSCULAR | Status: DC | PRN
  Administered 2018-10-21: 50 mg via INTRAVENOUS

## 2018-10-21 SURGICAL SUPPLY — 31 items
ADAPTER CATH URET PLST 4-6FR (CATHETERS) IMPLANT
BAG URINE DRAINAGE (UROLOGICAL SUPPLIES) ×3 IMPLANT
BAG URO CATCHER STRL LF (MISCELLANEOUS) ×3 IMPLANT
BENZOIN TINCTURE PRP APPL 2/3 (GAUZE/BANDAGES/DRESSINGS) IMPLANT
BLADE 10 SAFETY STRL DISP (BLADE) IMPLANT
BUCKET BIOHAZARD WASTE 5 GAL (MISCELLANEOUS) IMPLANT
CATH FOLEY 2WAY SLVR  5CC 16FR (CATHETERS) ×2
CATH FOLEY 2WAY SLVR 5CC 16FR (CATHETERS) ×1 IMPLANT
CATH INTERMIT  6FR 70CM (CATHETERS) ×3 IMPLANT
CATH URET 5FR 28IN CONE TIP (BALLOONS)
CATH URET 5FR 70CM CONE TIP (BALLOONS) IMPLANT
COVER WAND RF STERILE (DRAPES) IMPLANT
DRAPE CAMERA CLOSED 9X96 (DRAPES) ×3 IMPLANT
GOWN STRL REUS W/ TWL LRG LVL3 (GOWN DISPOSABLE) ×1 IMPLANT
GOWN STRL REUS W/ TWL XL LVL3 (GOWN DISPOSABLE) ×1 IMPLANT
GOWN STRL REUS W/TWL LRG LVL3 (GOWN DISPOSABLE) ×2
GOWN STRL REUS W/TWL XL LVL3 (GOWN DISPOSABLE) ×2
GUIDEWIRE ANG ZIPWIRE 038X150 (WIRE) ×3 IMPLANT
GUIDEWIRE COOK  .035 (WIRE) IMPLANT
GUIDEWIRE STR DUAL SENSOR (WIRE) ×3 IMPLANT
KIT TURNOVER KIT B (KITS) ×3 IMPLANT
NS IRRIG 1000ML POUR BTL (IV SOLUTION) ×3 IMPLANT
PACK CYSTO (CUSTOM PROCEDURE TRAY) ×3 IMPLANT
PAD ARMBOARD 7.5X6 YLW CONV (MISCELLANEOUS) ×6 IMPLANT
PLUG CATH AND CAP STER (CATHETERS) IMPLANT
STENT URET 6FRX24 CONTOUR (STENTS) ×6 IMPLANT
SYRINGE CONTROL L 12CC (SYRINGE) ×3 IMPLANT
SYRINGE TOOMEY DISP (SYRINGE) IMPLANT
UNDERPAD 30X30 (UNDERPADS AND DIAPERS) ×3 IMPLANT
WATER STERILE IRR 1000ML POUR (IV SOLUTION) IMPLANT
WIRE COONS/BENSON .038X145CM (WIRE) IMPLANT

## 2018-10-21 NOTE — ED Triage Notes (Signed)
BIB EMS from home. Pt lives with son, who reports incr'd confusion over the past week. Pt only really alert to self. EMS notes smell of urine. Son reports possible bloody urine 3 days ago. Recently started on metformin. Pt requires significant assist with any movement. Responders familiar with pt also reports significant departure from pt's normal mental status. V/S WNL.

## 2018-10-21 NOTE — Anesthesia Postprocedure Evaluation (Signed)
Anesthesia Post Note  Patient: Theresa Blake  Procedure(s) Performed: CYSTOSCOPY WITH RETROGRADE PYELOGRAM/URETERAL STENT PLACEMENT (Bilateral Ureter)     Patient location during evaluation: PACU Anesthesia Type: General Level of consciousness: awake and alert Pain management: pain level controlled Vital Signs Assessment: post-procedure vital signs reviewed and stable Respiratory status: spontaneous breathing, nonlabored ventilation, respiratory function stable and patient connected to nasal cannula oxygen Cardiovascular status: blood pressure returned to baseline and stable Postop Assessment: no apparent nausea or vomiting Anesthetic complications: no    Last Vitals:  Vitals:   10/21/18 1821 10/21/18 1830  BP: (!) 142/67 (!) 145/63  Pulse: 98 98  Resp: 19 20  Temp:  37 C  SpO2: 99% 96%    Last Pain:  Vitals:   10/21/18 1830  TempSrc:   PainSc: [place] Madalee Altmann

## 2018-10-21 NOTE — Progress Notes (Signed)
Consent has been filled out by Dr. Diona Fanti on arrival to Short Stay. Telephone consent completed with Roel Cluck and second witness. Patient has been at various stages of orientation in Short Stay at times she is able to express self and communicate clearly at other times she answers questions inappropriately.  Verified armband matches patient's stated name and birth date. Verified NPO status and that all jewelry, contact, glasses, dentures, and partials had been removed (if applicable).

## 2018-10-21 NOTE — Consult Note (Signed)
[facility] Renal Consultation Note  Requesting MD: Dr. Roxan Hockey Indication for Consultation:  AKI   Chief complaint: "confusion and blood in the urine"  HPI: Theresa Blake is a 77 y.o. female with a history of HTN and DM who presented with confusion and hematuria.  She is altered and not able to provide any history.  I spoke with her son [name] (at [phone]) and he provided the history below.  He reports that she has been confused and "panicky" recently.  There has been blood in her urine and sediment for the past few weeks.  She has not been eating for the past 6 days but reports drinking some water.  Her son states that a week and a half ago she was "normal".  Per her son, she had uterine cancer and was treated 4-5 years ago at [facility] with radiation for the same.    Work-up is notable for UA with 6-10 RBC and 30 mg/dl protein.  CT a/p demonstrated moderate bilateral hydro without stone or clear source of obstruction.  CT also incidentally noted multiple bilateral pulmonary nodules which were characterized as concerning for metastatic disease.  Spoke with primary team and urology in ER and urology is planning for stents later today.   Creatinine, Ser  Date/Time Value Ref Range Status  10/21/2018 10:23 AM 9.02 (H) 0.44 - 1.00 mg/dL Final  10/21/2018 03:34 AM 9.39 (H) 0.44 - 1.00 mg/dL Final  07/08/2018 03:30 AM 1.56 (H) 0.44 - 1.00 mg/dL Final  07/07/2018 02:47 AM 1.53 (H) 0.44 - 1.00 mg/dL Final  07/06/2018 03:21 AM 1.56 (H) 0.44 - 1.00 mg/dL Final  07/05/2018 02:58 AM 1.87 (H) 0.44 - 1.00 mg/dL Final  07/04/2018 04:02 AM 1.25 (H) 0.44 - 1.00 mg/dL Final  07/03/2018 02:47 AM 1.03 (H) 0.44 - 1.00 mg/dL Final  11/27/2017 05:16 AM 0.86 0.44 - 1.00 mg/dL Final  11/26/2017 09:47 PM 0.74 0.44 - 1.00 mg/dL Final  11/15/2017 02:16 AM 0.80 0.44 - 1.00 mg/dL Final  11/14/2017 12:29 PM 0.77 0.44 - 1.00 mg/dL Final  06/21/2017 05:07 AM 1.18 (H) 0.44 - 1.00 mg/dL Final   06/20/2017 03:26 AM 0.58 0.44 - 1.00 mg/dL Final  06/19/2017 05:56 PM 0.65 0.44 - 1.00 mg/dL Final  07/02/2016 06:18 AM 0.74 0.44 - 1.00 mg/dL Final  07/01/2016 04:34 AM 0.69 0.44 - 1.00 mg/dL Final  06/30/2016 05:08 PM 0.70 0.44 - 1.00 mg/dL Final  03/06/2013 08:55 AM 0.9 0.4 - 1.2 mg/dL Final  04/08/2012 01:46 PM 0.72 0.50 - 1.10 mg/dL Final  07/22/2010 02:39 PM 0.8 0.4 - 1.2 mg/dL Final  08/14/2009 10:26 AM 0.9 0.4 - 1.2 mg/dL Final  03/28/2008 09:46 AM 0.9 0.4 - 1.2 mg/dL Final     PMHx:   Past Medical History:  Diagnosis Date  . Abnormal Pap smear of cervix   . Allergic rhinitis, cause unspecified   . Backache, unspecified   . Depression    mood disorder; ? bipolar  . Dermatomycosis, unspecified   . Diabetes mellitus type II   . Edema   . Foot fracture, right   . HLD (hyperlipidemia)   . HTN (hypertension)   . Hx pulmonary embolism    multiple  . Lumbar spinal stenosis   . Malignant neoplasm of corpus uteri, except isthmus ([place])   . Morbid obesity ([place])   . Other specified disease of hair and hair follicles   . Pain in joint, pelvic region and thigh   . Panic disorder without agoraphobia  Past Surgical History:  Procedure Laterality Date  . BREAST BIOPSY     Right-benign  . ENDOMETRIAL BIOPSY  08/2006   attempted  . IVC FILTER INSERTION N/A 11/17/2017   Procedure: IVC FILTER INSERTION;  Surgeon: Katha Cabal, MD;  Location: [facility];  Service: Cardiovascular;  Laterality: N/A;  . LOWER EXTREMITY VENOGRAPHY N/A 11/17/2017   Procedure: LOWER EXTREMITY VENOGRAPHY;  Surgeon: Katha Cabal, MD;  Location: [facility];  Service: Cardiovascular;  Laterality: N/A;  . TONSILLECTOMY    . TUBAL LIGATION      Family Hx:  Family History  Problem Relation Age of Onset  . Heart attack Father 79  . Pancreatic cancer Mother        with mets  . Hyperlipidemia Son   . Hyperlipidemia Son   . Gout Son     Social History:  reports that  she has never smoked. She has never used smokeless tobacco. She reports that she does not drink alcohol or use drugs.  Allergies:  Allergies  Allergen Reactions  . Anesthetics, Halogenated Shortness Of Breath  . Levaquin [Levofloxacin In D5w] Itching  . Lamotrigine Itching  . Tramadol Rash    Medications: Prior to Admission medications   Medication Sig Start Date End Date Taking? Authorizing Provider  acetaminophen (TYLENOL) 325 MG tablet Take 650 mg by mouth every 6 (six) hours as needed for mild pain or headache.   Yes [provider]  aspirin EC 81 MG EC tablet Take 1 tablet (81 mg total) by mouth daily. 07/09/18  Yes Lavina Hamman, MD  buPROPion (WELLBUTRIN XL) 150 MG 24 hr tablet Take 300 mg by mouth daily.    Yes [provider]  clonazePAM (KLONOPIN) 1 MG tablet Take 1 mg by mouth 2 (two) times daily. 06/14/18  Yes [provider]  diclofenac sodium (VOLTAREN) 1 % GEL Apply 2 g topically every 8 (eight) hours as needed (lower back pain).  05/30/18  Yes [provider]  docusate sodium (COLACE) 100 MG capsule Take 100 capsules by mouth daily as needed for mild constipation.    Yes [provider]  ELIQUIS 2.5 MG TABS tablet Take 2.5 mg by mouth 2 (two) times daily. 05/24/18  Yes [provider]  ezetimibe-simvastatin (VYTORIN) 10-20 MG tablet Take 1 tablet by mouth at bedtime.    Yes [provider]  fluticasone (FLONASE) 50 MCG/ACT nasal spray Place 2 sprays into both nostrils daily.   Yes [provider]  levothyroxine (SYNTHROID, LEVOTHROID) 100 MCG tablet Take 1 tablet (100 mcg total) by mouth daily before breakfast. 07/09/18  Yes Lavina Hamman, MD  metFORMIN (GLUCOPHAGE) 500 MG tablet Take 500 mg by mouth daily. 05/26/18  Yes [provider]  Multiple Vitamin (MULTIVITAMIN WITH MINERALS) TABS tablet Take 1 tablet by mouth daily.   Yes [provider]  omeprazole (PRILOSEC) 20 MG capsule TAKE ONE  CAPSULE BY MOUTH EVERY DAY *NEEDS OFFICE VISIT* Patient taking differently: Take 20 mg by mouth daily.    Yes Einar Pheasant, MD  Venlafaxine HCl 225 MG TB24 Take 225 mg by mouth daily.    Yes [provider]  furosemide (LASIX) 40 MG tablet Take 1 tablet (40 mg total) by mouth as directed. 1 tablet Twice a day for 4 days, from 07/12/2018 take one tablet daily. 07/08/18   Lavina Hamman, MD    I have reviewed the patient's current and home medications.  Labs:  BMP  Latest Ref Rng & Units 10/21/2018 10/21/2018 07/08/2018  Glucose 70 - 99 mg/dL 144(H) 75 148(H)  BUN 8 - 23 mg/dL 93(H) 98(H) 31(H)  Creatinine 0.44 - 1.00 mg/dL 9.02(H) 9.39(H) 1.56(H)  Sodium 135 - 145 mmol/L 136 134(L) 137  Potassium 3.5 - 5.1 mmol/L 5.0 6.0(H) 4.7  Chloride 98 - 111 mmol/L 104 102 99  CO2 22 - 32 mmol/L 14(L) 15(L) 30  Calcium 8.9 - 10.3 mg/dL 9.8 10.0 10.3    Urinalysis    Component Value Date/Time   COLORURINE YELLOW 10/21/2018 [place] 10/21/2018 0551   LABSPEC 1.011 10/21/2018 0551   PHURINE 6.0 10/21/2018 0551   GLUCOSEU NEGATIVE 10/21/2018 0551   HGBUR MODERATE (A) 10/21/2018 0551   HGBUR negative 02/10/2010 1135   BILIRUBINUR NEGATIVE 10/21/2018 0551   BILIRUBINUR neg 06/11/2013 [place] 10/21/2018 0551   PROTEINUR 30 (A) 10/21/2018 0551   UROBILINOGEN 0.2 06/11/2013 1510   UROBILINOGEN 1.0 04/08/2012 1622   NITRITE NEGATIVE 10/21/2018 0551   LEUKOCYTESUR NEGATIVE 10/21/2018 0551     ROS: Unable to obtain secondary to altered mental status   Physical Exam: Vitals:   10/21/18 1059 10/21/18 1100  BP:  (!) 118/54  Pulse:    Resp: 19   Temp:    SpO2:       General:elderly female in stretcher agitated HEENT: NCAT Eyes: sclera anicteric; EOMI Neck: supple; no JVD Heart: tachycardia; S1S2; no rub Lungs: clear and unlabored Abdomen: soft/NT/ND Extremities: no edema lower extremities Skin: no rash on extremities exposed Neuro: altered;  oriented to person only  Assessment/Plan:  # AKI  - Secondary to obstruction.  Specifically, concerning for obstruction possibly related to recurrence of metastatic disease in patient with known uterine cancer - She is to be taken for stent placement with urology today  - Hopeful for improvement with stenting - Follow after stenting to assess for need for dialysis  - Continue foley for now   # CKD stage III  - Secondary to DM, HTN, as well as possible developing obstruction per last 06/2018 labs - Baseline Cr is 1.5 as above  # Hypotension  - note hx of HTN - Bolus normal saline now   # AMS - Secondary to acute illness - Supportive care - Family (son) is making decisions  # Hyperkalemia  - Resolving   # Metabolic acidosis  - Secondary to AKI as well as note metformin use in setting of AKI - Trend after stent placement   # Pulmonary nodules  - Concerning for metastatic disease per CT read - Per primary team   # DM  - Would hold metformin with AKI  I have spoken with the patient's primary team, urology, and her son.  Claudia Desanctis 10/21/2018, 1:15 PM

## 2018-10-21 NOTE — Progress Notes (Signed)
Attempted to give report to nurse on 71m. Refused to accept patient until STAT lactic acid lab was drawn and trending down. MDA Hollis notified, ordered lactic acid but verbalized he will not be treating it. Patient ready to be discharged from PACU to the floor. AC Kim made aware. Will draw lactic acid and transfer to floor.

## 2018-10-21 NOTE — ED Notes (Addendum)
Foley placed by Opal Sidles, EMT. Dr Denton Brick and Dr Royce Macadamia at bedside. Dr Denton Brick order a 500 ml bolus of normal saline. Dr Royce Macadamia agrees that pt is altered and is best to keep her NPO and hydrate her with IV fluids.

## 2018-10-21 NOTE — ED Notes (Signed)
Pt returned from radiology.

## 2018-10-21 NOTE — ED Notes (Addendum)
Previous RN had attending paged.I text paged Dr Denton Brick to make sure he was aware of LA 4.75 Attending states he called back more than once and was told that RN was on phone and would contact him when available.

## 2018-10-21 NOTE — Transfer of Care (Signed)
Immediate Anesthesia Transfer of Care Note  Patient: Theresa Blake  Procedure(s) Performed: CYSTOSCOPY WITH RETROGRADE PYELOGRAM/URETERAL STENT PLACEMENT (Bilateral Ureter)  Patient Location: PACU  Anesthesia Type:General  Level of Consciousness: awake and alert   Airway & Oxygen Therapy: Patient Spontanous Breathing and Patient connected to face mask oxygen  Post-op Assessment: Report given to RN and Post -op Vital signs reviewed and stable  Post vital signs: Reviewed and stable  Last Vitals:  Vitals Value Taken Time  BP 129/79 10/21/2018  4:36 PM  Temp 36.1 C 10/21/2018  4:36 PM  Pulse 212 10/21/2018  4:39 PM  Resp 20 10/21/2018  4:39 PM  SpO2 99 % 10/21/2018  4:39 PM  Vitals shown include unvalidated device data.  Last Pain:  Vitals:   10/21/18 0336  TempSrc:   PainSc: 0-No pain         Complications: No apparent anesthesia complications

## 2018-10-21 NOTE — Care Management (Signed)
This is a no charge note  Pending admission per Dr. Verta Ellen  77 year old lady with past medical history of PE on Eliquis, hypertension, hyperlipidemia, diabetes mellitus, obesity, CKD 3, who presents with altered mental status, hematuria.  Has some abdominal pain.  CT scan of abdomen/pelvis showed moderate bilateral hydroureteronephrosis. No nephroureterolithiasis or other clear source of obstruction.  CT head is negative for acute intracranial abnormalities.  Patient was found to have worsening renal function, creatinine is up from baseline 1.56 on 07/08/18 to 9.39, BUN 98.  Potassium 6.0 which was treated.  Urology, Dr. Diona Fanti was consulted by EDP.  Patient is admitted to telemetry bed as inpatient.    Ivor Costa, MD  [facility] Pager [phone]  If 7PM-7AM, please contact night-coverage [email or web address] Password [facility] 10/21/2018, 6:54 AM

## 2018-10-21 NOTE — Op Note (Signed)
Preoperative diagnosis: Bilateral ureteral obstruction with hydronephrosis and acute renal failure  Postoperative diagnosis: Same  Principal procedure: Cystoscopy, bilateral retrograde ureteral pyelograms with fluoroscopic interpretation, placement of bilateral double-J stents (24 cm x 6 French contour stent with tether removed)  Surgeon: Kaleiyah Polsky  Complications: None  Drains: 73 French Foley, bilateral ureteral stents as above  Estimated blood loss: None  Indications: 77 year old 77 year old female with recent presentation to the emergency room with mental status change, declining health and acute renal failure with a creatinine of 9.  Evaluation included CT scan which revealed bilateral hydronephrosis with hydroureter down to the lower pelvis.  There was no evidence of pelvic mass or stone.  Urologic consultation is requested and we proceed at this point for cystoscopy, retrograde and stent placement.  I discussed this with the patient who is not totally appreciative of the gravity of the situation as well as her son Legrand Como.  They have agreed to proceed with placement of double-J stents.  The procedure as well as risks and complications have been discussed with him briefly.  They desire to proceed.  Description of procedure: The patient was properly identified in the holding area was taken to the operating room where general anesthetic was administered.  She was placed in the dorsolithotomy position.  Genitalia and perineum were prepped and draped.  Proper timeout was performed.  80 French panendoscope was advanced into the bladder.  Bladder had stigmata of prior pelvic radiation.  Urothelium was somewhat pale with mild telangiectatic vessels.  Ureteral orifice ease were somewhat contracted and more medially placed.  I did not see any specific urothelial abnormalities.  Right retrograde ureteropyelogram was performed with a 6 [place] open-ended catheter and Omnipaque.  This revealed a string of  beads appearance of the distal ureter with significant hydroureteronephrosis prior/proximal to this.  No filling defects were seen, however.  Similar procedure was performed on the left as well.  Similar appearance of string of beads ureter with proximal hydroureteronephrosis without filling defects.  Right double-J stent was placed fairly easily over a sensor tip guidewire advanced through the open-ended catheter.  A 24 cm 6 [place] contour stent with a tether removed was placed with excellent proximal and distal curl seen fluoroscopically and cystoscopically following removal of the guidewire.  It was quite difficult to negotiate a sensor tip proximal in the ureter, and I then used a zip wire which was also somewhat difficult to navigate.  However, it was eventually advanced into the upper pole calyx.  The open-ended catheter was advanced over top of this to into the mid ureter with the zip wire removed, hydronephrotic drip was noted.  A sensor tip guidewire was then replaced and advanced into the upper pole calyx.  The open-ended catheter was removed and a similar 24 cm x 6 French contour stent was placed with excellent proximal and distal curl seen as well.  The scope was removed.  A 16 French Foley catheter was placed with 10 cc of water in the balloon.  This was hooked to dependent drainage.  Exam under anesthesia was then performed.  I did not feel any deep pelvic mass.  There was mild vaginal introital stenosis.  At this point, the procedure was terminated.  The patient was awakened and taken to the PACU in stable condition.  She tolerated the procedure well.

## 2018-10-21 NOTE — Anesthesia Procedure Notes (Signed)
Procedure Name: Intubation Date/Time: 10/21/2018 3:36 PM Performed by: Suzy Bouchard, CRNA Pre-anesthesia Checklist: Patient identified, Emergency Drugs available, Patient being monitored, Timeout performed and Suction available Patient Re-evaluated:Patient Re-evaluated prior to induction Oxygen Delivery Method: Circle system utilized Preoxygenation: Pre-oxygenation with 100% oxygen Induction Type: IV induction Ventilation: Mask ventilation without difficulty Laryngoscope Size: Miller and 2 Grade View: Grade I Tube type: Oral Tube size: 7.5 mm Number of attempts: 1 Airway Equipment and Method: Stylet Placement Confirmation: ETT inserted through vocal cords under direct vision,  positive ETCO2 and breath sounds checked- equal and bilateral Secured at: 23 cm Tube secured with: Tape Dental Injury: Teeth and Oropharynx as per pre-operative assessment

## 2018-10-21 NOTE — H&P (Signed)
Patient Demographics:    Theresa Blake, is a 77 y.o. female  MRN: [id number]   DOB - 03-23-1941  Admit Date - 10/21/2018  Outpatient Primary MD for the patient is Tracie Harrier, MD   Assessment & Plan:    Principal Problem:   AKI (acute kidney injury) ([place]) Active Problems:   Hypothyroidism   Diabetes mellitus type 2 in obese ([place])   Morbid obesity due to excess calories ([place])   Essential hypertension   PULMONARY EMBOLISM, HX OF   Endometrial cancer ([place])   Acute renal failure superimposed on stage 3 chronic kidney disease (H[facility])   Bil Hydronephrosis--- No stones   Toxic metabolic encephalopathy   Pulmonary metastases (H/o endometrial carcinoma)    1)AKI----acute kidney injury on CKD stage - III    creatinine on admission=9.39  ,   baseline creatinine = 1.5   , creatinine is now=/spread AKI on CKD 3 secondary to obstructive uropathy/bilateral hydronephrosis as well as decreased oral intake compounded volume depletion in the setting of poor oral intake, transient hypotension and also compounded by Lasix and metformin use    , renally adjust medications, avoid nephrotoxic agents/dehydration/hypotension.... Urology consult from Dr. Diona Fanti for cystoscopy with placement of stents to relieve obstruction-----Foley catheter placed without significant urine output...   Nephrology consult from Dr. Harrie Jeans appreciated   2) transient hypotension ----secondary to volume depletion in the setting of poor oral intake, cannot rule out significant infectious process, chest x-ray suggest possible pneumonia will treat empirically for CAP, continue to hydrate aggressively especially given elevated lactic acid reflecting poor tissue perfusion... Last known EF was 50 to 55% (07/04/18)  3) possible pulmonary  metastases----patient with history of endometrial carcinoma previously treated with radiation, apparently comorbid conditions precluded hysterectomy/surgical intervention--- discussed with patient's son, she will need to follow-up with oncology once acute issues resolve to discuss possible work-up and further management of possible metastatic endometrial carcinoma with possible lung mets  4) hypoglycemia----due to poor oral intake--continue dextrose solution until oral intake improves, patient's current mental status makes it unsafe for her to be fed at this time  5)H/o DVT/PE----continue Eliquis, consider holding Eliquis and switching to IV heparin if further procedures are planned by the urology team  6)DM2-recent A1c 6.3 reflecting excellent control, discontinue metformin due to renal failure with metabolic acidosis, Allow some permissive Hyperglycemia rather than risk life-threatening hypoglycemia in a patient with unreliable oral intake. Use Novolog/Humalog Sliding scale insulin with Accu-Cheks/Fingersticks as ordered  7)Hypothyroidism--- continue levothyroxine  8)Possible CAP--- treat empirically with IV Rocephin and azithromycin pending cultures,, hydrate aggressively given elevated lactic acid levels  9) toxic metabolic encephalopathy--- secondary to #1, also compounded by #2 , #4 and #8 above, treat as above   With History of - Reviewed by me  Past Medical History:  Diagnosis Date  . Abnormal Pap smear of cervix   . Allergic rhinitis, cause unspecified   . Backache, unspecified   . Depression  mood disorder; ? bipolar  . Dermatomycosis, unspecified   . Diabetes mellitus type II   . Edema   . Foot fracture, right   . HLD (hyperlipidemia)   . HTN (hypertension)   . Hx pulmonary embolism    multiple  . Lumbar spinal stenosis   . Malignant neoplasm of corpus uteri, except isthmus ([place])   . Morbid obesity ([place])   . Other specified disease of hair and hair follicles   . Pain  in joint, pelvic region and thigh   . Panic disorder without agoraphobia       Past Surgical History:  Procedure Laterality Date  . BREAST BIOPSY     Right-benign  . ENDOMETRIAL BIOPSY  08/2006   attempted  . IVC FILTER INSERTION N/A 11/17/2017   Procedure: IVC FILTER INSERTION;  Surgeon: Katha Cabal, MD;  Location: [facility];  Service: Cardiovascular;  Laterality: N/A;  . LOWER EXTREMITY VENOGRAPHY N/A 11/17/2017   Procedure: LOWER EXTREMITY VENOGRAPHY;  Surgeon: Katha Cabal, MD;  Location: [facility];  Service: Cardiovascular;  Laterality: N/A;  . TONSILLECTOMY    . TUBAL LIGATION        Chief Complaint  Patient presents with  . Altered Mental Status      HPI:    Theresa Blake  is a 77 yo morbidly obese female with past medical history of DVT/PE on Eliquis, hypertension, hyperlipidemia, diabetes mellitus,  CKD III (baseline Cr 1.5), who presents with altered mental status, hematuria and abdominal discomfort  Patient is seen and evaluated in the ED however she is a poor historian additional history obtained from patient's son [name] by phone  Apparently patient has felt unwell for almost a week she has had no significant oral intake in about 5 days, no significant vomiting or diarrhea  Of note patient was recently started on metformin apparently   in ED-   CT scan of abdomen/pelvis showed moderate bilateral hydroureteronephrosis. No nephroureterolithiasis or other clear source of obstruction.  CT head is negative for acute intracranial abnormalities.  Patient was found to have worsening renal function, creatinine is up from baseline 1.56 on 07/08/18 to 9.39, BUN 98.  Potassium 6.0 which was treated.  Urology, Dr. Diona Fanti was consulted by EDP.   Subsequently Foley catheter was placed in ED without significant urine output--- patient had episodes of transient hypotension and elevated lactic acid received IV fluids, UA is not strongly suggestive of  UTI  Patient remained pretty confused/disoriented........Marland Kitchen blood sugar was low at 67, patient received IV dextrose blood sugar went up to 142  >??? vaginal spotting,...   Chest x-ray suggest possible pneumonia, CT abdomen and pelvis with concerns about pulmonary nodules which may be metastatic given underlying history of endometrial carcinoma   Review of systems:    In addition to the HPI above,   A full Review of  Systems was done, all other systems reviewed are negative except as noted above in HPI , .    Social History:  Reviewed by me    Social History   Tobacco Use  . Smoking status: Never Smoker  . Smokeless tobacco: Never Used  Substance Use Topics  . Alcohol use: No       Family History :  Reviewed by me    Family History  Problem Relation Age of Onset  . Heart attack Father 58  . Pancreatic cancer Mother        with mets  . Hyperlipidemia Son   .  Hyperlipidemia Son   . Gout Son      Home Medications:   Prior to Admission medications   Medication Sig Start Date End Date Taking? Authorizing Provider  acetaminophen (TYLENOL) 325 MG tablet Take 650 mg by mouth every 6 (six) hours as needed for mild pain or headache.   Yes [provider]  aspirin EC 81 MG EC tablet Take 1 tablet (81 mg total) by mouth daily. 07/09/18  Yes Lavina Hamman, MD  buPROPion (WELLBUTRIN XL) 150 MG 24 hr tablet Take 300 mg by mouth daily.    Yes [provider]  clonazePAM (KLONOPIN) 1 MG tablet Take 1 mg by mouth 2 (two) times daily. 06/14/18  Yes [provider]  diclofenac sodium (VOLTAREN) 1 % GEL Apply 2 g topically every 8 (eight) hours as needed (lower back pain).  05/30/18  Yes [provider]  docusate sodium (COLACE) 100 MG capsule Take 100 capsules by mouth daily as needed for mild constipation.    Yes [provider]  ELIQUIS 2.5 MG TABS tablet Take 2.5 mg by mouth 2 (two) times daily. 05/24/18  Yes [provider]    ezetimibe-simvastatin (VYTORIN) 10-20 MG tablet Take 1 tablet by mouth at bedtime.    Yes [provider]  fluticasone (FLONASE) 50 MCG/ACT nasal spray Place 2 sprays into both nostrils daily.   Yes [provider]  levothyroxine (SYNTHROID, LEVOTHROID) 100 MCG tablet Take 1 tablet (100 mcg total) by mouth daily before breakfast. 07/09/18  Yes Lavina Hamman, MD  metFORMIN (GLUCOPHAGE) 500 MG tablet Take 500 mg by mouth daily. 05/26/18  Yes [provider]  Multiple Vitamin (MULTIVITAMIN WITH MINERALS) TABS tablet Take 1 tablet by mouth daily.   Yes [provider]  omeprazole (PRILOSEC) 20 MG capsule TAKE ONE CAPSULE BY MOUTH EVERY DAY *NEEDS OFFICE VISIT* Patient taking differently: Take 20 mg by mouth daily.    Yes Einar Pheasant, MD  Venlafaxine HCl 225 MG TB24 Take 225 mg by mouth daily.    Yes [provider]  furosemide (LASIX) 40 MG tablet Take 1 tablet (40 mg total) by mouth as directed. 1 tablet Twice a day for 4 days, from 07/12/2018 take one tablet daily. 07/08/18   Lavina Hamman, MD     Allergies:     Allergies  Allergen Reactions  . Anesthetics, Halogenated Shortness Of Breath  . Levaquin [Levofloxacin In D5w] Itching  . Lamotrigine Itching  . Tramadol Rash     Physical Exam:   Vitals  Blood pressure 120/65, pulse (!) 140, temperature (!) 96.1 F (35.6 C), temperature source Rectal, resp. rate 18, height 5\' 5"  (1.651 m), weight 120 kg, SpO2 100 %.  Physical Examination: General appearance -awake, ill-appearing obese, confused and disoriented Mental status -oriented to self otherwise disoriented and confused Eyes - sclera anicteric Neck - supple, no JVD elevation , Chest -diminished in bases with a few scattered rhonchi, especially on the right no wheezes Heart - S1 and S2 normal, regular  Abdomen - soft, nontender, nondistended, no masses or organomegaly, increased truncal adiposity but no CVA tenderness Neurological  -neuro exam is limited due to altered mental status with confusion and slight disorientation  extremities - no significant pedal edema noted, intact peripheral pulses  Skin - warm, dry GU--- Foley with scant amount of urine   Data Review:    CBC Recent Labs  Lab 10/21/18 0334  WBC 9.2  HGB 10.7*  HCT 34.0*  PLT 375  MCV 91.9  MCH 28.9  MCHC 31.5  RDW 17.8*  LYMPHSABS 1.4  MONOABS 1.0  EOSABS 0.5  BASOSABS 0.1   ------------------------------------------------------------------------------------------------------------------  Chemistries  Recent Labs  Lab 10/21/18 0334 10/21/18 1023  NA 134* 136  K 6.0* 5.0  CL 102 104  CO2 15* 14*  GLUCOSE 75 144*  BUN 98* 93*  CREATININE 9.39* 9.02*  CALCIUM 10.0 9.8  AST 8*  --   ALT 5  --   ALKPHOS 77  --   BILITOT 0.5  --    ------------------------------------------------------------------------------------------------------------------ estimated creatinine clearance is 6.9 mL/min (A) (by C-G formula based on SCr of 9.02 mg/dL (H)).  ----------------------------------------------  Cardiac Enzymes Recent Labs  Lab 10/21/18 0405  TROPONINI 0.10*   ------------------------------------------------------------------------------------------------------------------    Component Value Date/Time   BNP 1,250.6 (H) 10/21/2018 0438     ---------------------------------------------------------------------------------------------------------------  Urinalysis    Component Value Date/Time   COLORURINE YELLOW 10/21/2018 [place] 10/21/2018 0551   LABSPEC 1.011 10/21/2018 0551   PHURINE 6.0 10/21/2018 0551   GLUCOSEU NEGATIVE 10/21/2018 0551   HGBUR MODERATE (A) 10/21/2018 0551   HGBUR negative 02/10/2010 1135   BILIRUBINUR NEGATIVE 10/21/2018 0551   BILIRUBINUR neg 06/11/2013 [place] 10/21/2018 0551   PROTEINUR 30 (A) 10/21/2018 0551   UROBILINOGEN 0.2 06/11/2013 1510    UROBILINOGEN 1.0 04/08/2012 1622   NITRITE NEGATIVE 10/21/2018 0551   LEUKOCYTESUR NEGATIVE 10/21/2018 0551    ----------------------------------------------------------------------------------------------------------------   Imaging Results:    Ct Abdomen Pelvis Wo Contrast  Result Date: 10/21/2018 CLINICAL DATA:  Abdominal pain and confusion. EXAM: CT ABDOMEN AND PELVIS WITHOUT CONTRAST TECHNIQUE: Multidetector CT imaging of the abdomen and pelvis was performed following the standard protocol without IV contrast. COMPARISON:  CT abdomen pelvis 11/18/2017 FINDINGS: LOWER CHEST: Multiple bibasilar pulmonary nodules, measuring up to 10 mm. HEPATOBILIARY: The hepatic contours and density are normal. There is no intra- or extrahepatic biliary dilatation. The gallbladder is normal. PANCREAS: The pancreatic parenchymal contours are normal and there is no ductal dilatation. There is no peripancreatic fluid collection. SPLEEN: Normal. ADRENALS/URINARY TRACT: --Adrenal glands: Normal. --Right kidney/ureter: There is moderate hydroureteronephrosis. No nephroureterolithiasis or other clear source of obstruction. --Left kidney/ureter: There is moderate hydroureteronephrosis. No nephroureterolithiasis or other clear source of obstruction. --Urinary bladder: Urinary bladder is decompressed. STOMACH/BOWEL: --Stomach/Duodenum: There is no hiatal hernia or other gastric abnormality. The duodenal course and caliber are normal. --Small bowel: No dilatation or inflammation. --Colon: No focal abnormality. --Appendix: Not visualized. No right lower quadrant inflammation or free fluid. VASCULAR/LYMPHATIC: Atherosclerotic calcification is present within the non-aneurysmal abdominal aorta, without hemodynamically significant stenosis. No abdominal or pelvic lymphadenopathy. REPRODUCTIVE: There is a T-shaped contraceptive device within the uterus. Area of hypoattenuation at the anterior uterine fundus appears unchanged, though  poorly characterized without IV contrast MUSCULOSKELETAL. Multilevel degenerative disc disease and facet arthrosis. No bony spinal canal stenosis. OTHER: None. IMPRESSION: 1. Moderate bilateral hydroureteronephrosis. No nephroureterolithiasis or other clear source of obstruction. 2. Multiple bibasilar pulmonary nodules, measuring up to 10 mm, new since 11/18/17. These are concerning for metastatic disease. 3. Limited assessment of the uterine parenchyma without IV contrast material. Generally unchanged appearance of anterior fundal region of hypoattenuation. Aortic Atherosclerosis (ICD10-I70.0). Electronically Signed   By: Ulyses Jarred M.D.   On: 10/21/2018 06:02   Dg Chest 2 View  Result Date: 10/21/2018 CLINICAL DATA:  Acute onset of altered mental status. Confusion. EXAM: CHEST - 2 VIEW COMPARISON:  Chest radiograph performed 07/06/2018, and  CTA of the chest performed 07/03/2018 FINDINGS: The lungs are well-aerated. Patchy bilateral airspace opacification is similar in appearance to prior studies and may reflect recurrent pulmonary edema or possibly pneumonia. A small left pleural effusion is again noted. No pneumothorax is seen. The cardiomediastinal silhouette is borderline normal in size. There appears to be a mildly displaced fracture of the left lateral fifth rib. IMPRESSION: 1. Patchy bilateral airspace opacification is similar in appearance to prior studies and may reflect recurrent pulmonary edema or possibly pneumonia. Small left pleural effusion again noted. 2. Apparent mildly displaced fracture of the left lateral fifth rib. Electronically Signed   By: Garald Balding M.D.   On: 10/21/2018 04:24   Ct Head Wo Contrast  Result Date: 10/21/2018 CLINICAL DATA:  Acute onset of worsening confusion. Hematuria. EXAM: CT HEAD WITHOUT CONTRAST TECHNIQUE: Contiguous axial images were obtained from the base of the skull through the vertex without intravenous contrast. COMPARISON:  CT of the head performed  04/24/2018 FINDINGS: Brain: No evidence of acute infarction, hemorrhage, hydrocephalus, extra-axial collection or mass lesion / mass effect. Prominence of the ventricles and sulci reflects mild cortical volume loss. Diffuse periventricular and subcortical white matter change likely reflects small vessel ischemic microangiopathy. Chronic ischemic change is noted at the basal ganglia bilaterally. Mild cerebellar atrophy is noted. The brainstem and fourth ventricle are within normal limits. The basal ganglia are unremarkable in appearance. The cerebral hemispheres demonstrate grossly normal gray-white differentiation. No mass effect or midline shift is seen. Vascular: No hyperdense vessel or unexpected calcification. Skull: There is no evidence of fracture; visualized osseous structures are unremarkable in appearance. Sinuses/Orbits: The orbits are within normal limits. The paranasal sinuses and mastoid air cells are well-aerated. Other: No significant soft tissue abnormalities are seen. IMPRESSION: 1. No acute intracranial pathology seen on CT. 2. Mild cortical volume loss and diffuse small vessel ischemic microangiopathy. 3. Chronic ischemic change at the basal ganglia bilaterally. Electronically Signed   By: Garald Balding M.D.   On: 10/21/2018 05:55    Radiological Exams on Admission: Ct Abdomen Pelvis Wo Contrast  Result Date: 10/21/2018 CLINICAL DATA:  Abdominal pain and confusion. EXAM: CT ABDOMEN AND PELVIS WITHOUT CONTRAST TECHNIQUE: Multidetector CT imaging of the abdomen and pelvis was performed following the standard protocol without IV contrast. COMPARISON:  CT abdomen pelvis 11/18/2017 FINDINGS: LOWER CHEST: Multiple bibasilar pulmonary nodules, measuring up to 10 mm. HEPATOBILIARY: The hepatic contours and density are normal. There is no intra- or extrahepatic biliary dilatation. The gallbladder is normal. PANCREAS: The pancreatic parenchymal contours are normal and there is no ductal dilatation.  There is no peripancreatic fluid collection. SPLEEN: Normal. ADRENALS/URINARY TRACT: --Adrenal glands: Normal. --Right kidney/ureter: There is moderate hydroureteronephrosis. No nephroureterolithiasis or other clear source of obstruction. --Left kidney/ureter: There is moderate hydroureteronephrosis. No nephroureterolithiasis or other clear source of obstruction. --Urinary bladder: Urinary bladder is decompressed. STOMACH/BOWEL: --Stomach/Duodenum: There is no hiatal hernia or other gastric abnormality. The duodenal course and caliber are normal. --Small bowel: No dilatation or inflammation. --Colon: No focal abnormality. --Appendix: Not visualized. No right lower quadrant inflammation or free fluid. VASCULAR/LYMPHATIC: Atherosclerotic calcification is present within the non-aneurysmal abdominal aorta, without hemodynamically significant stenosis. No abdominal or pelvic lymphadenopathy. REPRODUCTIVE: There is a T-shaped contraceptive device within the uterus. Area of hypoattenuation at the anterior uterine fundus appears unchanged, though poorly characterized without IV contrast MUSCULOSKELETAL. Multilevel degenerative disc disease and facet arthrosis. No bony spinal canal stenosis. OTHER: None. IMPRESSION: 1. Moderate bilateral hydroureteronephrosis. No nephroureterolithiasis or other  clear source of obstruction. 2. Multiple bibasilar pulmonary nodules, measuring up to 10 mm, new since 11/18/17. These are concerning for metastatic disease. 3. Limited assessment of the uterine parenchyma without IV contrast material. Generally unchanged appearance of anterior fundal region of hypoattenuation. Aortic Atherosclerosis (ICD10-I70.0). Electronically Signed   By: Ulyses Jarred M.D.   On: 10/21/2018 06:02   Dg Chest 2 View  Result Date: 10/21/2018 CLINICAL DATA:  Acute onset of altered mental status. Confusion. EXAM: CHEST - 2 VIEW COMPARISON:  Chest radiograph performed 07/06/2018, and CTA of the chest performed  07/03/2018 FINDINGS: The lungs are well-aerated. Patchy bilateral airspace opacification is similar in appearance to prior studies and may reflect recurrent pulmonary edema or possibly pneumonia. A small left pleural effusion is again noted. No pneumothorax is seen. The cardiomediastinal silhouette is borderline normal in size. There appears to be a mildly displaced fracture of the left lateral fifth rib. IMPRESSION: 1. Patchy bilateral airspace opacification is similar in appearance to prior studies and may reflect recurrent pulmonary edema or possibly pneumonia. Small left pleural effusion again noted. 2. Apparent mildly displaced fracture of the left lateral fifth rib. Electronically Signed   By: Garald Balding M.D.   On: 10/21/2018 04:24   Ct Head Wo Contrast  Result Date: 10/21/2018 CLINICAL DATA:  Acute onset of worsening confusion. Hematuria. EXAM: CT HEAD WITHOUT CONTRAST TECHNIQUE: Contiguous axial images were obtained from the base of the skull through the vertex without intravenous contrast. COMPARISON:  CT of the head performed 04/24/2018 FINDINGS: Brain: No evidence of acute infarction, hemorrhage, hydrocephalus, extra-axial collection or mass lesion / mass effect. Prominence of the ventricles and sulci reflects mild cortical volume loss. Diffuse periventricular and subcortical white matter change likely reflects small vessel ischemic microangiopathy. Chronic ischemic change is noted at the basal ganglia bilaterally. Mild cerebellar atrophy is noted. The brainstem and fourth ventricle are within normal limits. The basal ganglia are unremarkable in appearance. The cerebral hemispheres demonstrate grossly normal gray-white differentiation. No mass effect or midline shift is seen. Vascular: No hyperdense vessel or unexpected calcification. Skull: There is no evidence of fracture; visualized osseous structures are unremarkable in appearance. Sinuses/Orbits: The orbits are within normal limits. The  paranasal sinuses and mastoid air cells are well-aerated. Other: No significant soft tissue abnormalities are seen. IMPRESSION: 1. No acute intracranial pathology seen on CT. 2. Mild cortical volume loss and diffuse small vessel ischemic microangiopathy. 3. Chronic ischemic change at the basal ganglia bilaterally. Electronically Signed   By: Garald Balding M.D.   On: 10/21/2018 05:55    DVT Prophylaxis -SCD  /eliquis AM Labs Ordered, also please review Full Orders  Family Communication: Admission, patients condition and plan of care including tests being ordered have been discussed with the patient and son [name]  who indicate understanding and agree with the plan   Code Status - Full Code  Likely DC to  TBD (SNF)   Condition   improving hemodynamics  Roxan Hockey M.D on 10/21/2018 at 2:39 PM Pager---[phone] Go to [email or web address] - [email or web address] for contact info  [facility] - Office  [phone]

## 2018-10-21 NOTE — Consult Note (Signed)
Urology Consult   Physician requesting consult: Sherwood Gambler  Reason for consult: Lateral hydronephrosis, acute renal failure  History of Present Illness: Theresa Blake is a 77 y.o. female presenting to the emergency room today, brought by her son with progressive mental status changes and weakness.  Evaluation included CT abdomen and pelvis due to her creatinine being 9.  This revealed bilateral hydronephrosis with transition point for her hydroureter in the pelvic area.  Additionally, in the lower chest, bilateral pulmonary nodules were noted.  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  Urologic consultation is requested for urgent management of bilateral ureteral obstruction.  In addition, Dr. Royce Macadamia is seeing the patient for nephrology.  The patient is a poor historian at the present time.    Past Medical History:  Diagnosis Date  . Abnormal Pap smear of cervix   . Allergic rhinitis, cause unspecified   . Backache, unspecified   . Depression    mood disorder; ? bipolar  . Dermatomycosis, unspecified   . Diabetes mellitus type II   . Edema   . Foot fracture, right   . HLD (hyperlipidemia)   . HTN (hypertension)   . Hx pulmonary embolism    multiple  . Lumbar spinal stenosis   . Malignant neoplasm of corpus uteri, except isthmus ([place])   . Morbid obesity ([place])   . Other specified disease of hair and hair follicles   . Pain in joint, pelvic region and thigh   . Panic disorder without agoraphobia     Past Surgical History:  Procedure Laterality Date  . BREAST BIOPSY     Right-benign  . ENDOMETRIAL BIOPSY  08/2006   attempted  . IVC FILTER INSERTION N/A 11/17/2017   Procedure: IVC FILTER INSERTION;  Surgeon: Katha Cabal, MD;  Location: [facility];  Service: Cardiovascular;  Laterality: N/A;  . LOWER EXTREMITY VENOGRAPHY N/A 11/17/2017   Procedure: LOWER EXTREMITY VENOGRAPHY;  Surgeon: Katha Cabal, MD;  Location:  [facility];  Service: Cardiovascular;  Laterality: N/A;  . TONSILLECTOMY    . TUBAL LIGATION       Current Hospital Medications: Scheduled Meds: . apixaban  2.5 mg Oral BID  . aspirin  81 mg Oral Daily  . buPROPion  300 mg Oral Daily  . clonazePAM  1 mg Oral BID  . ezetimibe-simvastatin  1 tablet Oral QHS  . fluticasone  2 spray Each Nare Daily  . [START ON 10/22/2018] levothyroxine  100 mcg Oral QAC breakfast  . multivitamin with minerals  1 tablet Oral Daily  . pantoprazole  40 mg Oral Daily  . sodium chloride flush  3 mL Intravenous Q12H  . sodium polystyrene  45 g Rectal Once  . Venlafaxine HCl  225 mg Oral Daily   Continuous Infusions: . sodium chloride    . sodium chloride    . sodium chloride    . albuterol 10 mg/hr (10/21/18 0653)  . sodium chloride 500 mL (10/21/18 1340)   PRN Meds:.sodium chloride, sodium chloride, acetaminophen **OR** acetaminophen, albuterol, ondansetron **OR** ondansetron (ZOFRAN) IV, polyethylene glycol, sodium chloride flush, traZODone  Allergies:  Allergies  Allergen Reactions  . Anesthetics, Halogenated Shortness Of Breath  . Levaquin [Levofloxacin In D5w] Itching  . Lamotrigine Itching  . Tramadol Rash    Family History  Problem Relation Age of Onset  . Heart attack Father 44  . Pancreatic cancer Mother        with mets  .  Hyperlipidemia Son   . Hyperlipidemia Son   . Gout Son     Social History:  reports that she has never smoked. She has never used smokeless tobacco. She reports that she does not drink alcohol or use drugs.  ROS: Unobtainable due to MS changes  Physical Exam:  Vital signs in last 24 hours: Temp:  [96.1 F (35.6 C)] 96.1 F (35.6 C) (12/28 0335) Pulse Rate:  [77-105] 105 (12/28 0930) Resp:  [14-26] 19 (12/28 1059) BP: (100-157)/(54-84) 118/54 (12/28 1100) SpO2:  [85 %-100 %] 99 % (12/28 0930) Weight:  [120 kg] 120 kg (12/28 0347) General:  Alert confused, No acute distress HEENT:  Normocephalic, atraumatic Neck: No JVD or lymphadenopathy Cardiovascular: Regular rate Lungs: Normal I/E excursion Abdomen: Soft, nontender, nondistended, no abdominal masses Back: No CVA tenderness Neurologic: Grossly intact  Laboratory Data:  Recent Labs    10/21/18 0334  WBC 9.2  HGB 10.7*  HCT 34.0*  PLT 375    Recent Labs    10/21/18 0334 10/21/18 1023  NA 134* 136  K 6.0* 5.0  CL 102 104  GLUCOSE 75 144*  BUN 98* 93*  CALCIUM 10.0 9.8  CREATININE 9.39* 9.02*     Results for orders placed or performed during the hospital encounter of 10/21/18 (from the past 24 hour(s))  Blood Culture (routine x 2)     Status: None (Preliminary result)   Collection Time: 10/21/18  3:15 AM  Result Value Ref Range   Specimen Description BLOOD RIGHT ARM    Special Requests      BOTTLES DRAWN AEROBIC AND ANAEROBIC Blood Culture adequate volume   Culture      NO GROWTH < 12 HOURS Performed at [facility], [address] [place], [place] [address]    Report Status PENDING   Blood Culture (routine x 2)     Status: None (Preliminary result)   Collection Time: 10/21/18  3:30 AM  Result Value Ref Range   Specimen Description BLOOD RIGHT HAND    Special Requests      BOTTLES DRAWN AEROBIC ONLY Blood Culture adequate volume   Culture      NO GROWTH < 12 HOURS Performed at [facility] [address], [place], [place] [address]    Report Status PENDING   Comprehensive metabolic panel     Status: Abnormal   Collection Time: 10/21/18  3:34 AM  Result Value Ref Range   Sodium 134 (L) 135 - 145 mmol/L   Potassium 6.0 (H) 3.5 - 5.1 mmol/L   Chloride 102 98 - 111 mmol/L   CO2 15 (L) 22 - 32 mmol/L   Glucose, Bld 75 70 - 99 mg/dL   BUN 98 (H) 8 - 23 mg/dL   Creatinine, Ser 9.39 (H) 0.44 - 1.00 mg/dL   Calcium 10.0 8.9 - 10.3 mg/dL   Total Protein 6.8 6.5 - 8.1 g/dL   Albumin 2.8 (L) 3.5 - 5.0 g/dL   AST 8 (L) 15 - 41 U/L   ALT 5 0 - 44 U/L   Alkaline Phosphatase 77  38 - 126 U/L   Total Bilirubin 0.5 0.3 - 1.2 mg/dL   GFR calc non Af Amer 4 (L) >60 mL/min   GFR calc Af Amer 4 (L) >60 mL/min   Anion gap 17 (H) 5 - 15  CBC WITH DIFFERENTIAL     Status: Abnormal   Collection Time: 10/21/18  3:34 AM  Result Value Ref Range  WBC 9.2 4.0 - 10.5 K/uL   RBC 3.70 (L) 3.87 - 5.11 MIL/uL   Hemoglobin 10.7 (L) 12.0 - 15.0 g/dL   HCT 34.0 (L) 36.0 - 46.0 %   MCV 91.9 80.0 - 100.0 fL   MCH 28.9 26.0 - 34.0 pg   MCHC 31.5 30.0 - 36.0 g/dL   RDW 17.8 (H) 11.5 - 15.5 %   Platelets 375 150 - 400 K/uL   nRBC 0.0 0.0 - 0.2 %   Neutrophils Relative % 66 %   Neutro Abs 6.2 1.7 - 7.7 K/uL   Lymphocytes Relative 15 %   Lymphs Abs 1.4 0.7 - 4.0 K/uL   Monocytes Relative 11 %   Monocytes Absolute 1.0 0.1 - 1.0 K/uL   Eosinophils Relative 6 %   Eosinophils Absolute 0.5 0.0 - 0.5 K/uL   Basophils Relative 1 %   Basophils Absolute 0.1 0.0 - 0.1 K/uL   Immature Granulocytes 1 %   Abs Immature Granulocytes 0.05 0.00 - 0.07 K/uL  I-Stat CG4 Lactic Acid, ED     Status: None   Collection Time: 10/21/18  3:36 AM  Result Value Ref Range   Lactic Acid, Venous 1.11 0.5 - 1.9 mmol/L  Lipase, blood     Status: None   Collection Time: 10/21/18  4:05 AM  Result Value Ref Range   Lipase 44 11 - 51 U/L  Troponin I - Add-On to previous collection     Status: Abnormal   Collection Time: 10/21/18  4:05 AM  Result Value Ref Range   Troponin I 0.10 (HH) <0.03 ng/mL  Brain natriuretic peptide     Status: Abnormal   Collection Time: 10/21/18  4:38 AM  Result Value Ref Range   B Natriuretic Peptide 1,250.6 (H) 0.0 - 100.0 pg/mL  CBG monitoring, ED     Status: None   Collection Time: 10/21/18  4:50 AM  Result Value Ref Range   Glucose-Capillary 74 70 - 99 mg/dL   Comment 1 Notify RN    Comment 2 Call MD NNP PA CNM    Comment 3 Document in Chart   Urinalysis, Routine w reflex microscopic     Status: Abnormal   Collection Time: 10/21/18  5:51 AM  Result Value Ref Range    Color, Urine YELLOW YELLOW   APPearance CLEAR CLEAR   Specific Gravity, Urine 1.011 1.005 - 1.030   pH 6.0 5.0 - 8.0   Glucose, UA NEGATIVE NEGATIVE mg/dL   Hgb urine dipstick MODERATE (A) NEGATIVE   Bilirubin Urine NEGATIVE NEGATIVE   Ketones, ur NEGATIVE NEGATIVE mg/dL   Protein, ur 30 (A) NEGATIVE mg/dL   Nitrite NEGATIVE NEGATIVE   Leukocytes, UA NEGATIVE NEGATIVE   RBC / HPF 6-10 0 - 5 RBC/hpf   WBC, UA 0-5 0 - 5 WBC/hpf   Bacteria, UA RARE (A) NONE SEEN   Squamous Epithelial / LPF 0-5 0 - 5   Hyaline Casts, UA PRESENT   CBG monitoring, ED     Status: Abnormal   Collection Time: 10/21/18  7:25 AM  Result Value Ref Range   Glucose-Capillary 67 (L) 70 - 99 mg/dL  Renal function panel     Status: Abnormal   Collection Time: 10/21/18 10:23 AM  Result Value Ref Range   Sodium 136 135 - 145 mmol/L   Potassium 5.0 3.5 - 5.1 mmol/L   Chloride 104 98 - 111 mmol/L   CO2 14 (L) 22 - 32 mmol/L   Glucose, Bld 144 (  H) 70 - 99 mg/dL   BUN 93 (H) 8 - 23 mg/dL   Creatinine, Ser 9.02 (H) 0.44 - 1.00 mg/dL   Calcium 9.8 8.9 - 10.3 mg/dL   Phosphorus 5.9 (H) 2.5 - 4.6 mg/dL   Albumin 2.5 (L) 3.5 - 5.0 g/dL   GFR calc non Af Amer 4 (L) >60 mL/min   GFR calc Af Amer 4 (L) >60 mL/min   Anion gap 18 (H) 5 - 15  I-Stat CG4 Lactic Acid, ED     Status: Abnormal   Collection Time: 10/21/18 10:41 AM  Result Value Ref Range   Lactic Acid, Venous 4.75 (HH) 0.5 - 1.9 mmol/L   Comment NOTIFIED PHYSICIAN   CBG monitoring, ED     Status: Abnormal   Collection Time: 10/21/18 11:31 AM  Result Value Ref Range   Glucose-Capillary 142 (H) 70 - 99 mg/dL   Comment 1 Notify RN    Comment 2 Document in Chart    Recent Results (from the past 240 hour(s))  Blood Culture (routine x 2)     Status: None (Preliminary result)   Collection Time: 10/21/18  3:15 AM  Result Value Ref Range Status   Specimen Description BLOOD RIGHT ARM  Final   Special Requests   Final    BOTTLES DRAWN AEROBIC AND ANAEROBIC Blood  Culture adequate volume   Culture   Final    NO GROWTH < 12 HOURS Performed at [facility], [address], [place], [place] [address]    Report Status PENDING  Incomplete  Blood Culture (routine x 2)     Status: None (Preliminary result)   Collection Time: 10/21/18  3:30 AM  Result Value Ref Range Status   Specimen Description BLOOD RIGHT HAND  Final   Special Requests   Final    BOTTLES DRAWN AEROBIC ONLY Blood Culture adequate volume   Culture   Final    NO GROWTH < 12 HOURS Performed at [facility], [place] [address] [place], [place] [address]    Report Status PENDING  Incomplete    Renal Function: Recent Labs    10/21/18 0334 10/21/18 1023  CREATININE 9.39* 9.02*   Estimated Creatinine Clearance: 6.9 mL/min (A) (by C-G formula based on SCr of 9.02 mg/dL (H)).  Radiologic Imaging: Ct Abdomen Pelvis Wo Contrast  Result Date: 10/21/2018 CLINICAL DATA:  Abdominal pain and confusion. EXAM: CT ABDOMEN AND PELVIS WITHOUT CONTRAST TECHNIQUE: Multidetector CT imaging of the abdomen and pelvis was performed following the standard protocol without IV contrast. COMPARISON:  CT abdomen pelvis 11/18/2017 FINDINGS: LOWER CHEST: Multiple bibasilar pulmonary nodules, measuring up to 10 mm. HEPATOBILIARY: The hepatic contours and density are normal. There is no intra- or extrahepatic biliary dilatation. The gallbladder is normal. PANCREAS: The pancreatic parenchymal contours are normal and there is no ductal dilatation. There is no peripancreatic fluid collection. SPLEEN: Normal. ADRENALS/URINARY TRACT: --Adrenal glands: Normal. --Right kidney/ureter: There is moderate hydroureteronephrosis. No nephroureterolithiasis or other clear source of obstruction. --Left kidney/ureter: There is moderate hydroureteronephrosis. No nephroureterolithiasis or other clear source of obstruction. --Urinary bladder: Urinary bladder is decompressed. STOMACH/BOWEL: --Stomach/Duodenum: There is no hiatal  hernia or other gastric abnormality. The duodenal course and caliber are normal. --Small bowel: No dilatation or inflammation. --Colon: No focal abnormality. --Appendix: Not visualized. No right lower quadrant inflammation or free fluid. VASCULAR/LYMPHATIC: Atherosclerotic calcification is present within the non-aneurysmal abdominal aorta, without hemodynamically significant stenosis. No abdominal or pelvic lymphadenopathy. REPRODUCTIVE: There is a T-shaped  contraceptive device within the uterus. Area of hypoattenuation at the anterior uterine fundus appears unchanged, though poorly characterized without IV contrast MUSCULOSKELETAL. Multilevel degenerative disc disease and facet arthrosis. No bony spinal canal stenosis. OTHER: None. IMPRESSION: 1. Moderate bilateral hydroureteronephrosis. No nephroureterolithiasis or other clear source of obstruction. 2. Multiple bibasilar pulmonary nodules, measuring up to 10 mm, new since 11/18/17. These are concerning for metastatic disease. 3. Limited assessment of the uterine parenchyma without IV contrast material. Generally unchanged appearance of anterior fundal region of hypoattenuation. Aortic Atherosclerosis (ICD10-I70.0). Electronically Signed   By: Ulyses Jarred M.D.   On: 10/21/2018 06:02   Dg Chest 2 View  Result Date: 10/21/2018 CLINICAL DATA:  Acute onset of altered mental status. Confusion. EXAM: CHEST - 2 VIEW COMPARISON:  Chest radiograph performed 07/06/2018, and CTA of the chest performed 07/03/2018 FINDINGS: The lungs are well-aerated. Patchy bilateral airspace opacification is similar in appearance to prior studies and may reflect recurrent pulmonary edema or possibly pneumonia. A small left pleural effusion is again noted. No pneumothorax is seen. The cardiomediastinal silhouette is borderline normal in size. There appears to be a mildly displaced fracture of the left lateral fifth rib. IMPRESSION: 1. Patchy bilateral airspace opacification is similar in  appearance to prior studies and may reflect recurrent pulmonary edema or possibly pneumonia. Small left pleural effusion again noted. 2. Apparent mildly displaced fracture of the left lateral fifth rib. Electronically Signed   By: Garald Balding M.D.   On: 10/21/2018 04:24   Ct Head Wo Contrast  Result Date: 10/21/2018 CLINICAL DATA:  Acute onset of worsening confusion. Hematuria. EXAM: CT HEAD WITHOUT CONTRAST TECHNIQUE: Contiguous axial images were obtained from the base of the skull through the vertex without intravenous contrast. COMPARISON:  CT of the head performed 04/24/2018 FINDINGS: Brain: No evidence of acute infarction, hemorrhage, hydrocephalus, extra-axial collection or mass lesion / mass effect. Prominence of the ventricles and sulci reflects mild cortical volume loss. Diffuse periventricular and subcortical white matter change likely reflects small vessel ischemic microangiopathy. Chronic ischemic change is noted at the basal ganglia bilaterally. Mild cerebellar atrophy is noted. The brainstem and fourth ventricle are within normal limits. The basal ganglia are unremarkable in appearance. The cerebral hemispheres demonstrate grossly normal gray-white differentiation. No mass effect or midline shift is seen. Vascular: No hyperdense vessel or unexpected calcification. Skull: There is no evidence of fracture; visualized osseous structures are unremarkable in appearance. Sinuses/Orbits: The orbits are within normal limits. The paranasal sinuses and mastoid air cells are well-aerated. Other: No significant soft tissue abnormalities are seen. IMPRESSION: 1. No acute intracranial pathology seen on CT. 2. Mild cortical volume loss and diffuse small vessel ischemic microangiopathy. 3. Chronic ischemic change at the basal ganglia bilaterally. Electronically Signed   By: Garald Balding M.D.   On: 10/21/2018 05:55    I independently reviewed the above imaging studies.  Impression/Assessment:  Bilateral  hydronephrosis with acute renal failure.  The patient's history includes gynecologic cancer having been treated, and with her pulmonary nodules and this process in the pelvis, I suspect that this may be the causative factor of her ureteral obstruction.  Plan:  I have spoken with the patient as well as her son, Theresa Blake regarding urgent management of this bilateral obstruction and acute renal failure.  Currently, the most expeditious treatment would be cystoscopy, retrogrades and bilateral stent placement.  We will get this scheduled to be done this afternoon.  I would suspect after the process is complete she will have postobstructive  diuresis which can be managed conservatively.  She will then need evaluation for possible metastatic carcinoma.

## 2018-10-21 NOTE — ED Notes (Signed)
Pt to radiology.

## 2018-10-21 NOTE — Progress Notes (Signed)
Attempt to call Theresa Blake, [facility] to follow up on pt transfer status to 8M. No answer at this time.

## 2018-10-21 NOTE — ED Provider Notes (Signed)
[facility] EMERGENCY DEPARTMENT Provider Note   CSN: [id number] Arrival date & time: 10/21/18  0316   LEVEL 5 CAVEAT - ALTERED MENTAL STATUS   History   Chief Complaint Chief Complaint  Patient presents with  . Altered Mental Status    HPI SYNTHIA FAIRBANK is a 77 y.o. female.  HPI 77 year old female presents with altered mental status.  History is very limited as EMS is no longer present so the history is mostly taken from the nurse who took report.  Son who lives with patient called EMS for altered mental state.  Reportedly has been ongoing for about a week.  Seem to be getting better but now getting worse again.  The patient knows she is in the hospital and states she is here because her mouth is so dry.  She denies any specific pain although the history is limited because she is altered.  Otherwise, EMS noted stable vital signs.  Past Medical History:  Diagnosis Date  . Abnormal Pap smear of cervix   . Allergic rhinitis, cause unspecified   . Backache, unspecified   . Depression    mood disorder; ? bipolar  . Dermatomycosis, unspecified   . Diabetes mellitus type II   . Edema   . Foot fracture, right   . HLD (hyperlipidemia)   . HTN (hypertension)   . Hx pulmonary embolism    multiple  . Lumbar spinal stenosis   . Malignant neoplasm of corpus uteri, except isthmus ([place])   . Morbid obesity ([place])   . Other specified disease of hair and hair follicles   . Pain in joint, pelvic region and thigh   . Panic disorder without agoraphobia     Patient Active Problem List   Diagnosis Date Noted  . Acute renal failure superimposed on stage 3 chronic kidney disease ([place]) 10/21/2018  . Acute on chronic diastolic CHF (congestive heart failure) ([place]) 07/03/2018  . Cognitive impairment 07/03/2018  . Bedbound 07/03/2018  . Bilateral pulmonary embolism ([place]) 11/27/2017  . Uterine bleeding 11/27/2017  . Endometrial cancer ([place]) 11/27/2017  . Left leg DVT ([place])  11/27/2017  . Chronic respiratory failure with hypoxia ([place]) 11/27/2017  . DVT (deep venous thrombosis) ([place]) 11/14/2017  . Weakness   . Palliative care by specialist   . Vaginal bleeding 06/19/2017  . History of endometrial cancer   . Acute on chronic respiratory failure with hypoxia ([place])   . Multiple rib fractures 06/30/2016  . Broken ribs 06/30/2016  . Goals of care, counseling/discussion 03/23/2014  . Hypoxia 04/29/2013  . Spinal stenosis of lumbar region 04/12/2012  . Neck strain 11/05/2011  . Wound of right leg 11/05/2011  . Cellulitis and abscess of leg 10/13/2011  . Sinusitis 10/13/2011  . Vertigo 10/13/2011  . Other screening mammogram 03/15/2011  . BACK PAIN, LUMBAR 12/22/2010  . HIP PAIN, RIGHT 12/21/2010  . ALLERGIC RHINITIS 07/22/2010  . Morbid obesity due to excess calories ([place]) 02/27/2010  . FUNGAL DERMATITIS 08/15/2009  . Hypothyroidism 07/13/2007  . Diabetes mellitus type 2 in obese ([place]) 03/24/2007  . Hyperlipidemia 03/24/2007  . PANIC DISORDER 03/24/2007  . Depression 03/24/2007  . Essential hypertension 03/24/2007  . FOLLICULITIS 95/28/4132  . BACK PAIN 03/24/2007  . EDEMA 03/24/2007  . PULMONARY EMBOLISM, HX OF 03/24/2007    Past Surgical History:  Procedure Laterality Date  . BREAST BIOPSY     Right-benign  . ENDOMETRIAL BIOPSY  08/2006   attempted  . IVC FILTER INSERTION N/A 11/17/2017  Procedure: IVC FILTER INSERTION;  Surgeon: Katha Cabal, MD;  Location: [facility];  Service: Cardiovascular;  Laterality: N/A;  . LOWER EXTREMITY VENOGRAPHY N/A 11/17/2017   Procedure: LOWER EXTREMITY VENOGRAPHY;  Surgeon: Katha Cabal, MD;  Location: [facility];  Service: Cardiovascular;  Laterality: N/A;  . TONSILLECTOMY    . TUBAL LIGATION       OB History   No obstetric history on file.      Home Medications    Prior to Admission medications   Medication Sig Start Date End Date Taking? Authorizing Provider  aspirin  EC 81 MG EC tablet Take 1 tablet (81 mg total) by mouth daily. 07/09/18   Lavina Hamman, MD  buPROPion (WELLBUTRIN XL) 150 MG 24 hr tablet Take 300 mg by mouth daily.     [provider]  clonazePAM (KLONOPIN) 1 MG tablet Take 1 mg by mouth 2 (two) times daily. 06/14/18   [provider]  diclofenac sodium (VOLTAREN) 1 % GEL Apply 2 g topically every 8 (eight) hours as needed (lower back pain).  05/30/18   [provider]  ELIQUIS 2.5 MG TABS tablet Take 2.5 mg by mouth 2 (two) times daily. 05/24/18   [provider]  ezetimibe-simvastatin (VYTORIN) 10-20 MG tablet Take 1 tablet by mouth at bedtime.     [provider]  fluticasone (FLONASE) 50 MCG/ACT nasal spray Place 2 sprays into both nostrils daily.    [provider]  furosemide (LASIX) 40 MG tablet Take 1 tablet (40 mg total) by mouth as directed. 1 tablet Twice a day for 4 days, from 07/12/2018 take one tablet daily. 07/08/18   Lavina Hamman, MD  levothyroxine (SYNTHROID, LEVOTHROID) 100 MCG tablet Take 1 tablet (100 mcg total) by mouth daily before breakfast. 07/09/18   Lavina Hamman, MD  metFORMIN (GLUCOPHAGE) 500 MG tablet Take 500 mg by mouth daily. 05/26/18   [provider]  Multiple Vitamin (MULTIVITAMIN WITH MINERALS) TABS tablet Take 1 tablet by mouth daily.    [provider]  omeprazole (PRILOSEC) 20 MG capsule TAKE ONE CAPSULE BY MOUTH EVERY DAY *NEEDS OFFICE VISIT* Patient taking differently: Take 20 mg by mouth daily.     Einar Pheasant, MD  Venlafaxine HCl 225 MG TB24 Take 225 mg by mouth daily.     [provider]    Family History Family History  Problem Relation Age of Onset  . Heart attack Father 20  . Pancreatic cancer Mother        with mets  . Hyperlipidemia Son   . Hyperlipidemia Son   . Gout Son     Social History Social History   Tobacco Use  . Smoking status: Never Smoker  . Smokeless tobacco: Never Used  Substance Use  Topics  . Alcohol use: No  . Drug use: No     Allergies   Anesthetics, halogenated; Levaquin [levofloxacin in d5w]; Lamotrigine; and Tramadol   Review of Systems Review of Systems  Unable to perform ROS: Mental status change     Physical Exam Updated Vital Signs BP 121/84   Pulse 84   Temp (!) 96.1 F (35.6 C) (Rectal)   Resp 19   Ht 5\' 5"  (1.651 m)   Wt 120 kg   SpO2 97%   BMI 44.02 kg/m   Physical Exam Vitals signs and nursing note reviewed.  Constitutional:      Appearance: She is well-developed.  HENT:  Head: Normocephalic and atraumatic.     Right Ear: External ear normal.     Left Ear: External ear normal.     Nose: Nose normal.     Mouth/Throat:     Mouth: Mucous membranes are dry.  Eyes:     General:        Right eye: No discharge.        Left eye: No discharge.     Extraocular Movements: Extraocular movements intact.     Pupils: Pupils are equal, round, and reactive to light.  Cardiovascular:     Rate and Rhythm: Normal rate and regular rhythm.     Heart sounds: Normal heart sounds.  Pulmonary:     Effort: Pulmonary effort is normal.     Breath sounds: Normal breath sounds.  Abdominal:     Palpations: Abdomen is soft.     Tenderness: There is abdominal tenderness (mild, diffuse, hard to localize).  Musculoskeletal:     Comments: Swelling to BLE but no pitting edema, unclear if baseline for patient  Skin:    General: Skin is warm and dry.  Neurological:     Mental Status: She is alert.     Comments: Awake, alert, oriented to person and place. No facial droop. 5/5 strength in BUE. Unable to get BLE off stretcher  Psychiatric:        Mood and Affect: Mood is not anxious.      ED Treatments / Results  Labs (all labs ordered are listed, but only abnormal results are displayed) Labs Reviewed  COMPREHENSIVE METABOLIC PANEL - Abnormal; Notable for the following components:      Result Value   Sodium 134 (*)    Potassium 6.0 (*)    CO2 15  (*)    BUN 98 (*)    Creatinine, Ser 9.39 (*)    Albumin 2.8 (*)    AST 8 (*)    GFR calc non Af Amer 4 (*)    GFR calc Af Amer 4 (*)    Anion gap 17 (*)    All other components within normal limits  CBC WITH DIFFERENTIAL/PLATELET - Abnormal; Notable for the following components:   RBC 3.70 (*)    Hemoglobin 10.7 (*)    HCT 34.0 (*)    RDW 17.8 (*)    All other components within normal limits  URINALYSIS, ROUTINE W REFLEX MICROSCOPIC - Abnormal; Notable for the following components:   Hgb urine dipstick MODERATE (*)    Protein, ur 30 (*)    Bacteria, UA RARE (*)    All other components within normal limits  TROPONIN I - Abnormal; Notable for the following components:   Troponin I 0.10 (*)    All other components within normal limits  BRAIN NATRIURETIC PEPTIDE - Abnormal; Notable for the following components:   B Natriuretic Peptide 1,250.6 (*)    All other components within normal limits  CULTURE, BLOOD (ROUTINE X 2)  CULTURE, BLOOD (ROUTINE X 2)  URINE CULTURE  LIPASE, BLOOD  CK  I-STAT CG4 LACTIC ACID, ED  CBG MONITORING, ED  CBG MONITORING, ED  I-STAT CG4 LACTIC ACID, ED    EKG EKG Interpretation  Date/Time:  Saturday October 21 2018 03:36:32 EST Ventricular Rate:  82 PR Interval:    QRS Duration: 128 QT Interval:  402 QTC Calculation: 470 R Axis:   8 Text Interpretation:  Sinus rhythm Consider right atrial enlargement Nonspecific intraventricular conduction delay similar to Sept 2019 Confirmed by Regenia Skeeter,  Banner Huckaba [phone]) on 10/21/2018 3:49:44 AM   Radiology Ct Abdomen Pelvis Wo Contrast  Result Date: 10/21/2018 CLINICAL DATA:  Abdominal pain and confusion. EXAM: CT ABDOMEN AND PELVIS WITHOUT CONTRAST TECHNIQUE: Multidetector CT imaging of the abdomen and pelvis was performed following the standard protocol without IV contrast. COMPARISON:  CT abdomen pelvis 11/18/2017 FINDINGS: LOWER CHEST: Multiple bibasilar pulmonary nodules, measuring up to 10 mm.  HEPATOBILIARY: The hepatic contours and density are normal. There is no intra- or extrahepatic biliary dilatation. The gallbladder is normal. PANCREAS: The pancreatic parenchymal contours are normal and there is no ductal dilatation. There is no peripancreatic fluid collection. SPLEEN: Normal. ADRENALS/URINARY TRACT: --Adrenal glands: Normal. --Right kidney/ureter: There is moderate hydroureteronephrosis. No nephroureterolithiasis or other clear source of obstruction. --Left kidney/ureter: There is moderate hydroureteronephrosis. No nephroureterolithiasis or other clear source of obstruction. --Urinary bladder: Urinary bladder is decompressed. STOMACH/BOWEL: --Stomach/Duodenum: There is no hiatal hernia or other gastric abnormality. The duodenal course and caliber are normal. --Small bowel: No dilatation or inflammation. --Colon: No focal abnormality. --Appendix: Not visualized. No right lower quadrant inflammation or free fluid. VASCULAR/LYMPHATIC: Atherosclerotic calcification is present within the non-aneurysmal abdominal aorta, without hemodynamically significant stenosis. No abdominal or pelvic lymphadenopathy. REPRODUCTIVE: There is a T-shaped contraceptive device within the uterus. Area of hypoattenuation at the anterior uterine fundus appears unchanged, though poorly characterized without IV contrast MUSCULOSKELETAL. Multilevel degenerative disc disease and facet arthrosis. No bony spinal canal stenosis. OTHER: None. IMPRESSION: 1. Moderate bilateral hydroureteronephrosis. No nephroureterolithiasis or other clear source of obstruction. 2. Multiple bibasilar pulmonary nodules, measuring up to 10 mm, new since 11/18/17. These are concerning for metastatic disease. 3. Limited assessment of the uterine parenchyma without IV contrast material. Generally unchanged appearance of anterior fundal region of hypoattenuation. Aortic Atherosclerosis (ICD10-I70.0). Electronically Signed   By: Ulyses Jarred M.D.   On:  10/21/2018 06:02   Dg Chest 2 View  Result Date: 10/21/2018 CLINICAL DATA:  Acute onset of altered mental status. Confusion. EXAM: CHEST - 2 VIEW COMPARISON:  Chest radiograph performed 07/06/2018, and CTA of the chest performed 07/03/2018 FINDINGS: The lungs are well-aerated. Patchy bilateral airspace opacification is similar in appearance to prior studies and may reflect recurrent pulmonary edema or possibly pneumonia. A small left pleural effusion is again noted. No pneumothorax is seen. The cardiomediastinal silhouette is borderline normal in size. There appears to be a mildly displaced fracture of the left lateral fifth rib. IMPRESSION: 1. Patchy bilateral airspace opacification is similar in appearance to prior studies and may reflect recurrent pulmonary edema or possibly pneumonia. Small left pleural effusion again noted. 2. Apparent mildly displaced fracture of the left lateral fifth rib. Electronically Signed   By: Garald Balding M.D.   On: 10/21/2018 04:24   Ct Head Wo Contrast  Result Date: 10/21/2018 CLINICAL DATA:  Acute onset of worsening confusion. Hematuria. EXAM: CT HEAD WITHOUT CONTRAST TECHNIQUE: Contiguous axial images were obtained from the base of the skull through the vertex without intravenous contrast. COMPARISON:  CT of the head performed 04/24/2018 FINDINGS: Brain: No evidence of acute infarction, hemorrhage, hydrocephalus, extra-axial collection or mass lesion / mass effect. Prominence of the ventricles and sulci reflects mild cortical volume loss. Diffuse periventricular and subcortical white matter change likely reflects small vessel ischemic microangiopathy. Chronic ischemic change is noted at the basal ganglia bilaterally. Mild cerebellar atrophy is noted. The brainstem and fourth ventricle are within normal limits. The basal ganglia are unremarkable in appearance. The cerebral hemispheres demonstrate grossly normal gray-white differentiation. No mass  effect or midline shift  is seen. Vascular: No hyperdense vessel or unexpected calcification. Skull: There is no evidence of fracture; visualized osseous structures are unremarkable in appearance. Sinuses/Orbits: The orbits are within normal limits. The paranasal sinuses and mastoid air cells are well-aerated. Other: No significant soft tissue abnormalities are seen. IMPRESSION: 1. No acute intracranial pathology seen on CT. 2. Mild cortical volume loss and diffuse small vessel ischemic microangiopathy. 3. Chronic ischemic change at the basal ganglia bilaterally. Electronically Signed   By: Garald Balding M.D.   On: 10/21/2018 05:55    Procedures .Critical Care Performed by: Sherwood Gambler, MD Authorized by: Sherwood Gambler, MD   Critical care provider statement:    Critical care time (minutes):  35   Critical care time was exclusive of:  Separately billable procedures and treating other patients   Critical care was necessary to treat or prevent imminent or life-threatening deterioration of the following conditions:  Renal failure and CNS failure or compromise   Critical care was time spent personally by me on the following activities:  Development of treatment plan with patient or surrogate, discussions with consultants, evaluation of patient's response to treatment, examination of patient, obtaining history from patient or surrogate, ordering and performing treatments and interventions, ordering and review of laboratory studies, ordering and review of radiographic studies, pulse oximetry, re-evaluation of patient's condition and review of old charts   (including critical care time)  Medications Ordered in ED Medications  0.9 %  sodium chloride infusion (has no administration in time range)  insulin aspart (novoLOG) injection 5 Units (has no administration in time range)    And  dextrose 50 % solution 50 mL (has no administration in time range)  dextrose 10 % infusion (has no administration in time range)  calcium  gluconate 1 g/ 50 mL sodium chloride IVPB (has no administration in time range)  sodium polystyrene (KAYEXALATE) 15 GM/60ML suspension 45 g (has no administration in time range)  albuterol (PROVENTIL,VENTOLIN) solution continuous neb (10 mg/hr Nebulization New Bag/Given 10/21/18 0653)  sodium bicarbonate injection 50 mEq (has no administration in time range)  sodium chloride 0.9 % bolus 1,000 mL (0 mLs Intravenous Stopped 10/21/18 0540)  cefTRIAXone (ROCEPHIN) 1 g in sodium chloride 0.9 % 100 mL IVPB (0 g Intravenous Stopped 10/21/18 0540)  azithromycin (ZITHROMAX) 500 mg in sodium chloride 0.9 % 250 mL IVPB (0 mg Intravenous Stopped 10/21/18 0607)     Initial Impression / Assessment and Plan / ED Course  I have reviewed the triage vital signs and the nursing notes.  Pertinent labs & imaging results that were available during my care of the patient were reviewed by me and considered in my medical decision making (see chart for details).     Patient is altered though awake and alert.  Hemodynamically she is not unstable and has some mild hypothermia.  With the chest x-ray findings and her altered mental state, I will cover her for pneumonia.  Her labs are significant for a new creatinine of 9 with significant BUN elevation.  This is likely causing some of the mental status change.  She does look dehydrated so fluids were given.  Given the abdominal discomfort a CT was obtained and shows bilateral hydronephrosis but no obvious obstruction.  She does not have urinary retention.  I discussed with urology, Dr. Diona Fanti, who states she probably needs stents and he will come see her in the hospital.  Otherwise she will need admission to the hospitalist service.  Dr.  Niu to admit.  He request treatment for hyperkalemia with insulin, glucose, calcium.  Final Clinical Impressions(s) / ED Diagnoses   Final diagnoses:  Acute kidney injury superimposed on chronic kidney disease ([place])  Delirium  HCAP  (healthcare-associated pneumonia)    ED Discharge Orders    None       Sherwood Gambler, MD 10/21/18 0730

## 2018-10-21 NOTE — Progress Notes (Signed)
Call back from [facility] Maudie Mercury) - pt can go to 60M if lactic acid is below 4, must hold pt in PACU until lab is resulted/ Call to Dr Denton Brick regarding lactic acid level drawn in ER and request from 60M for redraw prior to transfer to floor. Call MD  At [phone] if lactic acid is elevated, greater than 4

## 2018-10-21 NOTE — ED Notes (Signed)
EMT in and out cathed patient, observed blood coming from patients vagina.  Did not observe any noticeable wounds or discharge was not malodorous.

## 2018-10-21 NOTE — ED Notes (Signed)
Patient transported to X-ray 

## 2018-10-21 NOTE — Anesthesia Preprocedure Evaluation (Addendum)
Anesthesia Evaluation  Patient identified by MRN, date of birth, ID band Patient awake    Reviewed: Allergy & Precautions, NPO status , Patient's Chart, lab work & pertinent test results  Airway Mallampati: III  TM Distance: >3 FB Neck ROM: Full    Dental  (+) Teeth Intact, Dental Advisory Given   Pulmonary    breath sounds clear to auscultation       Cardiovascular hypertension, +CHF   Rhythm:Regular Rate:Normal     Neuro/Psych Anxiety Depression  Neuromuscular disease    GI/Hepatic Neg liver ROS, GERD  Medicated,  Endo/Other  diabetes, Type 2, Oral Hypoglycemic AgentsHypothyroidism   Renal/GU Renal InsufficiencyRenal disease     Musculoskeletal   Abdominal Normal abdominal exam  (+)   Peds  Hematology   Anesthesia Other Findings - HLD  Reproductive/Obstetrics                            Lab Results  Component Value Date   WBC 9.2 10/21/2018   HGB 10.7 (L) 10/21/2018   HCT 34.0 (L) 10/21/2018   MCV 91.9 10/21/2018   PLT 375 10/21/2018   Lab Results  Component Value Date   CREATININE 9.02 (H) 10/21/2018   BUN 93 (H) 10/21/2018   NA 136 10/21/2018   K 5.0 10/21/2018   CL 104 10/21/2018   CO2 14 (L) 10/21/2018   Lab Results  Component Value Date   INR 1.13 07/03/2018   INR 1.37 11/27/2017   INR 1.01 11/14/2017   Echo: - Left ventricle: The cavity size was normal. Systolic function was   normal. The estimated ejection fraction was in the range of 50%   to 55%. Wall motion was normal; there were no regional wall   motion abnormalities. - Mitral valve: Calcified annulus. - Left atrium: The atrium was mildly dilated. - Right ventricle: Systolic function was moderately reduced.  Anesthesia Physical Anesthesia Plan  ASA: III and emergent  Anesthesia Plan: General   Post-op Pain Management:    Induction: Intravenous  PONV Risk Score and Plan: 4 or greater and Ondansetron,  Dexamethasone and Treatment may vary due to age or medical condition  Airway Management Planned: Oral ETT  Additional Equipment: None  Intra-op Plan:   Post-operative Plan: Possible Post-op intubation/ventilation  Informed Consent: I have reviewed the patients History and Physical, chart, labs and discussed the procedure including the risks, benefits and alternatives for the proposed anesthesia with the patient or authorized representative who has indicated his/her understanding and acceptance.   Dental advisory given  Plan Discussed with: CRNA  Anesthesia Plan Comments:      Anesthesia Quick Evaluation

## 2018-10-21 NOTE — Progress Notes (Signed)
Lactic acid 2.9. Floor notified and ready to accept patient. Patient moving from PACU to 30m21

## 2018-10-22 ENCOUNTER — Encounter (HOSPITAL_COMMUNITY): Payer: Self-pay | Admitting: Urology

## 2018-10-22 DIAGNOSIS — N179 Acute kidney failure, unspecified: Principal | ICD-10-CM

## 2018-10-22 LAB — MRSA PCR SCREENING: MRSA by PCR: POSITIVE — AB

## 2018-10-22 LAB — URINE CULTURE: Culture: NO GROWTH

## 2018-10-22 LAB — BASIC METABOLIC PANEL
Anion gap: 12 (ref 5–15)
BUN: 87 mg/dL — AB (ref 8–23)
CO2: 17 mmol/L — ABNORMAL LOW (ref 22–32)
Calcium: 9.1 mg/dL (ref 8.9–10.3)
Chloride: 108 mmol/L (ref 98–111)
Creatinine, Ser: 8.2 mg/dL — ABNORMAL HIGH (ref 0.44–1.00)
GFR calc Af Amer: 5 mL/min — ABNORMAL LOW (ref >60)
GFR calc non Af Amer: 4 mL/min — ABNORMAL LOW (ref >60)
Glucose, Bld: 94 mg/dL (ref 70–99)
Potassium: 5.4 mmol/L — ABNORMAL HIGH (ref 3.5–5.1)
Sodium: 137 mmol/L (ref 135–145)

## 2018-10-22 LAB — CBC
HCT: 29.7 % — ABNORMAL LOW (ref 36.0–46.0)
Hemoglobin: 9.3 g/dL — ABNORMAL LOW (ref 12.0–15.0)
MCH: 28.9 pg (ref 26.0–34.0)
MCHC: 31.3 g/dL (ref 30.0–36.0)
MCV: 92.2 fL (ref 80.0–100.0)
Platelets: 302 10*3/uL (ref 150–400)
RBC: 3.22 MIL/uL — ABNORMAL LOW (ref 3.87–5.11)
RDW: 18.2 % — ABNORMAL HIGH (ref 11.5–15.5)
WBC: 10 10*3/uL (ref 4.0–10.5)
nRBC: 0 % (ref 0.0–0.2)

## 2018-10-22 LAB — APTT
aPTT: 40 seconds — ABNORMAL HIGH (ref 24–36)
aPTT: 67 seconds — ABNORMAL HIGH (ref 24–36)

## 2018-10-22 LAB — HEPARIN LEVEL (UNFRACTIONATED): HEPARIN UNFRACTIONATED: 1.94 [IU]/mL — AB (ref 0.30–0.70)

## 2018-10-22 LAB — GLUCOSE, CAPILLARY
GLUCOSE-CAPILLARY: 81 mg/dL (ref 70–99)
Glucose-Capillary: 83 mg/dL (ref 70–99)
Glucose-Capillary: 73 mg/dL (ref 70–99)
Glucose-Capillary: 112 mg/dL — ABNORMAL HIGH (ref 70–99)

## 2018-10-22 MED ORDER — BUPROPION HCL ER (XL) 150 MG PO TB24: 150.0000 mg | ORAL_TABLET | Freq: Every day | ORAL | Status: DC

## 2018-10-22 MED ORDER — SODIUM BICARBONATE 650 MG PO TABS
650.0000 mg | ORAL_TABLET | Freq: Three times a day (TID) | ORAL | Status: DC
  Administered 2018-10-22 (×3): 650 mg via ORAL
  Filled 2018-10-22 (×3): qty 1

## 2018-10-22 MED ORDER — SODIUM POLYSTYRENE SULFONATE 15 GM/60ML PO SUSP: 30.0000 g | Freq: Once | ORAL | Status: DC

## 2018-10-22 MED ORDER — SODIUM ZIRCONIUM CYCLOSILICATE 10 G PO PACK
10.0000 g | PACK | Freq: Every day | ORAL | Status: DC
  Filled 2018-10-22: qty 1

## 2018-10-22 MED ORDER — HEPARIN (PORCINE) 25000 UT/250ML-% IV SOLN
1250.0000 [IU]/h | INTRAVENOUS | Status: DC
  Administered 2018-10-22: 1100 [IU]/h via INTRAVENOUS
  Administered 2018-10-23 – 2018-10-24 (×2): 1200 [IU]/h via INTRAVENOUS
  Administered 2018-10-25: 1400 [IU]/h via INTRAVENOUS
  Administered 2018-10-26 – 2018-10-29 (×4): 1250 [IU]/h via INTRAVENOUS
  Filled 2018-10-22 (×9): qty 250

## 2018-10-22 MED ORDER — SODIUM CHLORIDE 0.9 % IV SOLN
INTRAVENOUS | Status: DC
  Administered 2018-10-22: 13:00:00 via INTRAVENOUS

## 2018-10-22 MED ORDER — INSULIN ASPART 100 UNIT/ML ~~LOC~~ SOLN: 0.0000 [IU] | Freq: Three times a day (TID) | SUBCUTANEOUS | Status: DC

## 2018-10-22 MED ORDER — SODIUM ZIRCONIUM CYCLOSILICATE 10 G PO PACK
10.0000 g | PACK | Freq: Once | ORAL | Status: AC
  Administered 2018-10-22: 10 g via ORAL
  Filled 2018-10-22: qty 1

## 2018-10-22 NOTE — Progress Notes (Addendum)
PROGRESS NOTE                                                                                                                                                                                                             Patient Demographics:    Theresa Blake, is a 77 y.o. female, DOB - 1940/11/23, GQQ:[id number]  Admit date - 10/21/2018   Admitting Physician Courage Denton Brick, MD  Outpatient Primary MD for the patient is Tracie Harrier, MD  LOS - 1  Chief Complaint  Patient presents with  . Altered Mental Status       Brief Narrative  Theresa Blake  is a 77 yo morbidly obese female with past medical history of DVT/PE on Eliquis, hypertension, hyperlipidemia, diabetes mellitus,  CKD III (baseline Cr 1.5), who presents with altered mental status, hematuria and abdominal discomfort, endometrial cancer was brought in for decreased mental status in the hospital found to be in acute renal failure, hyperkalemic, bilateral hydronephrosis with ureteric obstruction.  Nephrology and urology were consulted and we were requested to admit.    Subjective:    Theresa Blake today has, No headache, No chest pain, No abdominal pain - No Nausea, No new weakness tingling or numbness, No Cough - SOB.    Assessment  & Plan :     1.  ARF, hyperkalemia due to bilateral hydronephrosis [place] due to complication of underlying history of endometrial cancer -seen by urology and nephrology, underwent cystoscopy with bilateral ureteric stent placement by urologist Dr. Diona Fanti on 10/21/2018, continue IV fluids, repeated Kayexalate and Lokelma, discussed with nephrologist.  Monitor renal function closely.  For now continue Rocephin.  2.  History of endometrial cancer now possibility of pulmonary mets and bilateral hydronephrosis.  Once stable outpatient oncology follow-up, called son [name] with no response.  Would like to discuss future line of care and CODE STATUS.  3.  History of PE.  Was on Eliquis  currently will place her on heparin drip due to renal function and monitor.  4.  Acute metabolic acidosis.  Due to #1 above.  Placed on oral bicarb.  Monitor closely.  5.  Metabolic encephalopathy.  Due to #1 above.  Improving.  6.  Hypothyroidism.  On Synthroid continue.  7.  GERD.  PPI.  8.  Dyslipidemia.  On statin continue.  9.  Possible pneumonia.  On Rocephin and azithromycin, could be pulmonary edema.  Will monitor clinically.  10. DM 2 - ISS  CBG (last 3)  Recent Labs  10/22/18 0308 10/22/18 0721 10/22/18 1057  GLUCAP 83 73 112*   Lab Results  Component Value Date   HGBA1C 6.3 (H) 07/03/2018      Family Communication  :   None, called son [name] on his cell phone no response.  Code Status :  Full  Disposition Plan  :  TBD  Consults  :  Renal, Urology  Procedures  :    CT - 1. Moderate bilateral hydroureteronephrosis. No nephroureterolithiasis or other clear source of obstruction. 2. Multiple bibasilar pulmonary nodules, measuring up to 10 mm, new since 11/18/17. These are concerning for metastatic disease. 3. Limited assessment of the uterine parenchyma without IV contrast material. Generally unchanged appearance of anterior fundal region of hypoattenuation. Aortic Atherosclerosis  DVT Prophylaxis  :  Hep gtt  Lab Results  Component Value Date   PLT 302 10/22/2018    Diet :  Diet Order            Diet renal/carb modified with fluid restriction Diet-HS Snack? Nothing; Fluid restriction: Other (see comments); Room service appropriate? Yes; Fluid consistency: Thin  Diet effective now               Inpatient Medications Scheduled Meds: . apixaban  2.5 mg Oral BID  . aspirin  81 mg Oral Daily  . buPROPion  300 mg Oral Daily  . clonazePAM  1 mg Oral BID  . ezetimibe-simvastatin  1 tablet Oral QHS  . fluticasone  2 spray Each Nare Daily  . levothyroxine  100 mcg Oral QAC breakfast  . multivitamin with minerals  1 tablet Oral Daily  . pantoprazole   40 mg Oral Daily  . sodium chloride flush  3 mL Intravenous Q12H  . sodium polystyrene  45 g Rectal Once  . venlafaxine XR  225 mg Oral Daily   Continuous Infusions: . sodium chloride    . sodium chloride    . sodium chloride 125 mL/hr at 10/22/18 0200  . albuterol 10 mg/hr (10/21/18 0653)  . azithromycin 500 mg (10/22/18 0752)  . cefTRIAXone (ROCEPHIN)  IV 1 g (10/22/18 0700)   PRN Meds:.sodium chloride, sodium chloride, acetaminophen **OR** acetaminophen, albuterol, ondansetron **OR** ondansetron (ZOFRAN) IV, polyethylene glycol, sodium chloride flush, traZODone  Antibiotics  :   Anti-infectives (From admission, onward)   Start     Dose/Rate Route Frequency Ordered Stop   10/22/18 0600  cefTRIAXone (ROCEPHIN) 1 g in sodium chloride 0.9 % 100 mL IVPB     1 g 200 mL/hr over 30 Minutes Intravenous Every 24 hours 10/21/18 1438     10/22/18 0600  azithromycin (ZITHROMAX) 500 mg in sodium chloride 0.9 % 250 mL IVPB     500 mg 250 mL/hr over 60 Minutes Intravenous Every 24 hours 10/21/18 1438     10/21/18 0445  cefTRIAXone (ROCEPHIN) 1 g in sodium chloride 0.9 % 100 mL IVPB     1 g 200 mL/hr over 30 Minutes Intravenous  Once 10/21/18 0435 10/21/18 0540   10/21/18 0445  azithromycin (ZITHROMAX) 500 mg in sodium chloride 0.9 % 250 mL IVPB     500 mg 250 mL/hr over 60 Minutes Intravenous  Once 10/21/18 0435 10/21/18 0607        Objective:   Vitals:   10/21/18 1903 10/21/18 1955 10/22/18 0525 10/22/18 0722  BP: (!) 151/60 136/63 115/90 (!) 132/59  Pulse: 98 98 (!) 101 (!) 102  Resp: 19 18 19 18   Temp: 97.7 F (36.5 C) 98.2 F (36.8  C) 97.6 F (36.4 C) (!) 97.5 F (36.4 C)  TempSrc: Oral Oral Oral Oral  SpO2: 90% 99% 97% 96%  Weight:  120.6 kg    Height:        Wt Readings from Last 3 Encounters:  10/21/18 120.6 kg  07/08/18 114.4 kg  11/30/17 (!) 142.4 kg     Intake/Output Summary (Last 24 hours) at 10/22/2018 1138 Last data filed at 10/22/2018 0949 Gross per 24  hour  Intake 2704.95 ml  Output 2055 ml  Net 649.95 ml     Physical Exam  Awake mildly confused, No new F.N deficits, has her extremity tremors right more than left, Foley catheter in place [place].AT,PERRAL Supple Neck,No JVD, No cervical lymphadenopathy appriciated.  Symmetrical Chest wall movement, Good air movement bilaterally, few rales RRR,No Gallops,Rubs or new Murmurs, No Parasternal Heave +ve B.Sounds, Abd Soft, No tenderness, No organomegaly appriciated, No rebound - guarding or rigidity. No Cyanosis, Clubbing or edema, No new Rash or bruise       Data Review:    CBC Recent Labs  Lab 10/21/18 0334 10/22/18 0612  WBC 9.2 10.0  HGB 10.7* 9.3*  HCT 34.0* 29.7*  PLT 375 302  MCV 91.9 92.2  MCH 28.9 28.9  MCHC 31.5 31.3  RDW 17.8* 18.2*  LYMPHSABS 1.4  --   MONOABS 1.0  --   EOSABS 0.5  --   BASOSABS 0.1  --     Chemistries  Recent Labs  Lab 10/21/18 0334 10/21/18 1023 10/21/18 1928 10/22/18 0612  NA 134* 136 134* 137  K 6.0* 5.0 5.1 5.4*  CL 102 104 105 108  CO2 15* 14* 14* 17*  GLUCOSE 75 144* 164* 94  BUN 98* 93* 89* 87*  CREATININE 9.39* 9.02* 8.62* 8.20*  CALCIUM 10.0 9.8 9.4 9.1  AST 8*  --   --   --   ALT 5  --   --   --   ALKPHOS 77  --   --   --   BILITOT 0.5  --   --   --    ------------------------------------------------------------------------------------------------------------------ No results for input(s): CHOL, HDL, LDLCALC, TRIG, CHOLHDL, LDLDIRECT in the last 72 hours.  Lab Results  Component Value Date   HGBA1C 6.3 (H) 07/03/2018   ------------------------------------------------------------------------------------------------------------------ No results for input(s): TSH, T4TOTAL, T3FREE, THYROIDAB in the last 72 hours.  Invalid input(s): FREET3 ------------------------------------------------------------------------------------------------------------------ No results for input(s): VITAMINB12, FOLATE, FERRITIN, TIBC, IRON,  RETICCTPCT in the last 72 hours.  Coagulation profile No results for input(s): INR, PROTIME in the last 168 hours.  No results for input(s): DDIMER in the last 72 hours.  Cardiac Enzymes Recent Labs  Lab 10/21/18 0405  TROPONINI 0.10*   ------------------------------------------------------------------------------------------------------------------    Component Value Date/Time   BNP 1,250.6 (H) 10/21/2018 7654    Micro Results Recent Results (from the past 240 hour(s))  Blood Culture (routine x 2)     Status: None (Preliminary result)   Collection Time: 10/21/18  3:15 AM  Result Value Ref Range Status   Specimen Description BLOOD RIGHT ARM  Final   Special Requests   Final    BOTTLES DRAWN AEROBIC AND ANAEROBIC Blood Culture adequate volume   Culture  Setup Time NO ORGANISMS SEEN  Final   Culture   Final    NO GROWTH 1 DAY Performed at [facility], [address] [place], [place] [address]    Report Status PENDING  Incomplete  Blood Culture (routine x 2)  Status: None (Preliminary result)   Collection Time: 10/21/18  3:30 AM  Result Value Ref Range Status   Specimen Description BLOOD RIGHT HAND  Final   Special Requests   Final    BOTTLES DRAWN AEROBIC ONLY Blood Culture adequate volume   Culture   Final    NO GROWTH 1 DAY Performed at [facility], [address], [place], [place] [address]    Report Status PENDING  Incomplete  Urine culture     Status: None   Collection Time: 10/21/18  5:51 AM  Result Value Ref Range Status   Specimen Description URINE, CATHETERIZED  Final   Special Requests NONE  Final   Culture   Final    NO GROWTH Performed at [facility], [address], [place], [place] [address]    Report Status 10/22/2018 FINAL  Final  MRSA PCR Screening     Status: Abnormal   Collection Time: 10/22/18  8:08 AM  Result Value Ref Range Status   MRSA by PCR POSITIVE (A) NEGATIVE Final    Comment:        The GeneXpert MRSA  Assay (FDA approved for NASAL specimens only), is one component of a comprehensive MRSA colonization surveillance program. It is not intended to diagnose MRSA infection nor to guide or monitor treatment for MRSA infections. CRITICAL RESULT CALLED TO, READ BACK BY AND VERIFIED WITH: L. BENNET, RN AT 0945 ON 10/22/18 BY C. JESSUP, MLT. Performed at [facility], [place] [address], [place], [place] [address]     Radiology Reports Ct Abdomen Pelvis Wo Contrast  Result Date: 10/21/2018 CLINICAL DATA:  Abdominal pain and confusion. EXAM: CT ABDOMEN AND PELVIS WITHOUT CONTRAST TECHNIQUE: Multidetector CT imaging of the abdomen and pelvis was performed following the standard protocol without IV contrast. COMPARISON:  CT abdomen pelvis 11/18/2017 FINDINGS: LOWER CHEST: Multiple bibasilar pulmonary nodules, measuring up to 10 mm. HEPATOBILIARY: The hepatic contours and density are normal. There is no intra- or extrahepatic biliary dilatation. The gallbladder is normal. PANCREAS: The pancreatic parenchymal contours are normal and there is no ductal dilatation. There is no peripancreatic fluid collection. SPLEEN: Normal. ADRENALS/URINARY TRACT: --Adrenal glands: Normal. --Right kidney/ureter: There is moderate hydroureteronephrosis. No nephroureterolithiasis or other clear source of obstruction. --Left kidney/ureter: There is moderate hydroureteronephrosis. No nephroureterolithiasis or other clear source of obstruction. --Urinary bladder: Urinary bladder is decompressed. STOMACH/BOWEL: --Stomach/Duodenum: There is no hiatal hernia or other gastric abnormality. The duodenal course and caliber are normal. --Small bowel: No dilatation or inflammation. --Colon: No focal abnormality. --Appendix: Not visualized. No right lower quadrant inflammation or free fluid. VASCULAR/LYMPHATIC: Atherosclerotic calcification is present within the non-aneurysmal abdominal aorta, without hemodynamically significant stenosis.  No abdominal or pelvic lymphadenopathy. REPRODUCTIVE: There is a T-shaped contraceptive device within the uterus. Area of hypoattenuation at the anterior uterine fundus appears unchanged, though poorly characterized without IV contrast MUSCULOSKELETAL. Multilevel degenerative disc disease and facet arthrosis. No bony spinal canal stenosis. OTHER: None. IMPRESSION: 1. Moderate bilateral hydroureteronephrosis. No nephroureterolithiasis or other clear source of obstruction. 2. Multiple bibasilar pulmonary nodules, measuring up to 10 mm, new since 11/18/17. These are concerning for metastatic disease. 3. Limited assessment of the uterine parenchyma without IV contrast material. Generally unchanged appearance of anterior fundal region of hypoattenuation. Aortic Atherosclerosis (ICD10-I70.0). Electronically Signed   By: Ulyses Jarred M.D.   On: 10/21/2018 06:02   Dg Chest 2 View  Result Date: 10/21/2018 CLINICAL DATA:  Acute onset of altered mental status. Confusion.  EXAM: CHEST - 2 VIEW COMPARISON:  Chest radiograph performed 07/06/2018, and CTA of the chest performed 07/03/2018 FINDINGS: The lungs are well-aerated. Patchy bilateral airspace opacification is similar in appearance to prior studies and may reflect recurrent pulmonary edema or possibly pneumonia. A small left pleural effusion is again noted. No pneumothorax is seen. The cardiomediastinal silhouette is borderline normal in size. There appears to be a mildly displaced fracture of the left lateral fifth rib. IMPRESSION: 1. Patchy bilateral airspace opacification is similar in appearance to prior studies and may reflect recurrent pulmonary edema or possibly pneumonia. Small left pleural effusion again noted. 2. Apparent mildly displaced fracture of the left lateral fifth rib. Electronically Signed   By: Garald Balding M.D.   On: 10/21/2018 04:24   Ct Head Wo Contrast  Result Date: 10/21/2018 CLINICAL DATA:  Acute onset of worsening confusion.  Hematuria. EXAM: CT HEAD WITHOUT CONTRAST TECHNIQUE: Contiguous axial images were obtained from the base of the skull through the vertex without intravenous contrast. COMPARISON:  CT of the head performed 04/24/2018 FINDINGS: Brain: No evidence of acute infarction, hemorrhage, hydrocephalus, extra-axial collection or mass lesion / mass effect. Prominence of the ventricles and sulci reflects mild cortical volume loss. Diffuse periventricular and subcortical white matter change likely reflects small vessel ischemic microangiopathy. Chronic ischemic change is noted at the basal ganglia bilaterally. Mild cerebellar atrophy is noted. The brainstem and fourth ventricle are within normal limits. The basal ganglia are unremarkable in appearance. The cerebral hemispheres demonstrate grossly normal gray-white differentiation. No mass effect or midline shift is seen. Vascular: No hyperdense vessel or unexpected calcification. Skull: There is no evidence of fracture; visualized osseous structures are unremarkable in appearance. Sinuses/Orbits: The orbits are within normal limits. The paranasal sinuses and mastoid air cells are well-aerated. Other: No significant soft tissue abnormalities are seen. IMPRESSION: 1. No acute intracranial pathology seen on CT. 2. Mild cortical volume loss and diffuse small vessel ischemic microangiopathy. 3. Chronic ischemic change at the basal ganglia bilaterally. Electronically Signed   By: Garald Balding M.D.   On: 10/21/2018 05:55   Dg C-arm 1-60 Min-no Report  Result Date: 10/21/2018 Fluoroscopy was utilized by the requesting physician.  No radiographic interpretation.    Time Spent in minutes  30   Lala Lund M.D on 10/22/2018 at 11:38 AM  To page go to [email or web address] - password [facility]

## 2018-10-22 NOTE — Progress Notes (Addendum)
ANTICOAGULATION CONSULT NOTE - Initial Consult  Pharmacy Consult for Heparin Indication: atrial fibrillation h/o DVT/PE on chronic Eliquis  Allergies  Allergen Reactions  . Anesthetics, Halogenated Shortness Of Breath    10/22/18 Spoke to patients son.  Shortness of breath occurred with Ether when patient was a child.    [place] [Levofloxacin In D5w] Itching  . Lamotrigine Itching  . Tramadol Rash    Patient Measurements: Height: 5\' 5"  (165.1 cm) Weight: 265 lb 14 oz (120.6 kg) IBW/kg (Calculated) : 57 Heparin Dosing Weight: 86 kg  Vital Signs: Temp: 97.5 F (36.4 C) (12/29 0722) Temp Source: Oral (12/29 0722) BP: 132/59 (12/29 0722) Pulse Rate: 102 (12/29 0722)  Labs: Recent Labs    10/21/18 0334 10/21/18 0405 10/21/18 1023 10/21/18 1928 10/22/18 0612  HGB 10.7*  --   --   --  9.3*  HCT 34.0*  --   --   --  29.7*  PLT 375  --   --   --  302  CREATININE 9.39*  --  9.02* 8.62* 8.20*  CKTOTAL  --   --  18*  --   --   TROPONINI  --  0.10*  --   --   --     Estimated Creatinine Clearance: 7.6 mL/min (A) (by C-G formula based on SCr of 8.2 mg/dL (H)).   Medical History: Past Medical History:  Diagnosis Date  . Abnormal Pap smear of cervix   . Allergic rhinitis, cause unspecified   . Backache, unspecified   . Depression    mood disorder; ? bipolar  . Dermatomycosis, unspecified   . Diabetes mellitus type II   . Edema   . Foot fracture, right   . HLD (hyperlipidemia)   . HTN (hypertension)   . Hx pulmonary embolism    multiple  . Lumbar spinal stenosis   . Malignant neoplasm of corpus uteri, except isthmus ([place])   . Morbid obesity ([place])   . Other specified disease of hair and hair follicles   . Pain in joint, pelvic region and thigh   . Panic disorder without agoraphobia     Medications:  Medications Prior to Admission  Medication Sig Dispense Refill Last Dose  . acetaminophen (TYLENOL) 325 MG tablet Take 650 mg by mouth every 6 (six) hours as  needed for mild pain or headache.   Past Week at Unknown time  . aspirin EC 81 MG EC tablet Take 1 tablet (81 mg total) by mouth daily. 30 tablet 0 10/20/2018 at Unknown time  . buPROPion (WELLBUTRIN XL) 150 MG 24 hr tablet Take 300 mg by mouth daily.    10/20/2018 at Unknown time  . clonazePAM (KLONOPIN) 1 MG tablet Take 1 mg by mouth 2 (two) times daily.  0 10/20/2018 at Unknown time  . diclofenac sodium (VOLTAREN) 1 % GEL Apply 2 g topically every 8 (eight) hours as needed (lower back pain).   3 Past Week at Unknown time  . docusate sodium (COLACE) 100 MG capsule Take 100 capsules by mouth daily as needed for mild constipation.    10/20/2018 at Unknown time  . ELIQUIS 2.5 MG TABS tablet Take 2.5 mg by mouth 2 (two) times daily.  1 10/20/2018 at 19:00  . ezetimibe-simvastatin (VYTORIN) 10-20 MG tablet Take 1 tablet by mouth at bedtime.    10/20/2018 at Unknown time  . fluticasone (FLONASE) 50 MCG/ACT nasal spray Place 2 sprays into both nostrils daily.   10/20/2018 at Unknown time  .  levothyroxine (SYNTHROID, LEVOTHROID) 100 MCG tablet Take 1 tablet (100 mcg total) by mouth daily before breakfast. 30 tablet 0 10/20/2018 at Unknown time  . metFORMIN (GLUCOPHAGE) 500 MG tablet Take 500 mg by mouth daily.  3 10/20/2018 at Unknown time  . Multiple Vitamin (MULTIVITAMIN WITH MINERALS) TABS tablet Take 1 tablet by mouth daily.   10/20/2018 at Unknown time  . omeprazole (PRILOSEC) 20 MG capsule TAKE ONE CAPSULE BY MOUTH EVERY DAY *NEEDS OFFICE VISIT* (Patient taking differently: Take 20 mg by mouth daily. ) 30 capsule 0 10/20/2018 at Unknown time  . Venlafaxine HCl 225 MG TB24 Take 225 mg by mouth daily.    10/20/2018 at Unknown time  . furosemide (LASIX) 40 MG tablet Take 1 tablet (40 mg total) by mouth as directed. 1 tablet Twice a day for 4 days, from 07/12/2018 take one tablet daily. 60 tablet 0    Scheduled:  . aspirin  81 mg Oral Daily  . buPROPion  300 mg Oral Daily  . clonazePAM  1 mg Oral BID   . ezetimibe-simvastatin  1 tablet Oral QHS  . fluticasone  2 spray Each Nare Daily  . insulin aspart  0-9 Units Subcutaneous TID WC  . levothyroxine  100 mcg Oral QAC breakfast  . multivitamin with minerals  1 tablet Oral Daily  . pantoprazole  40 mg Oral Daily  . sodium bicarbonate  650 mg Oral TID  . sodium zirconium cyclosilicate  10 g Oral Daily  . venlafaxine XR  225 mg Oral Daily    Assessment: 77 y.o female morbidly obese w/ PMH DVT/PE on Eliquis, presents to ED 12/28 with AMS,  hematuria and abdominal discomfort, h/o endometrial cancer.  On Eliquis PTA, last dose taken pta was on 12/27 at 19:00.  Found to be in acute renal failure.   Pharmacy consulted to start IV heparin while eliquis on hold due to acute renal failure.   Goal of Therapy:  Heparin level 0.3-0.7 units/ml aPTT 66-102 seconds Monitor platelets by anticoagulation protocol: Yes   Plan:  Stat PTT and HL 2/2 on Eliquis PTA (lab has already drawn) Monitor via aPTT due to PTA eliquis effect on heparin level Start heparin drip at 1100 units/hr 8 h aPTT Daily aPTT, HL, CBC, monitor for s/sx of bleeding   Nicole Cella, RPh Clinical Pharmacist Please check AMION for all [place] phone numbers After 10:00 PM, call [place] 10/22/2018,12:00 PM

## 2018-10-22 NOTE — Progress Notes (Signed)
[facility] Progress Note  Name: Theresa Blake MRN: [id number] DOB: December 13, 1940  Chief Complaint:  AMS, hematuria  Subjective:  Patient had bilateral ureteral stents placed with urology/Dr. Diona Fanti.  She had 1 liter UOP charted for 12/29 thus far.  She has been more alert and able to take PO meds but still confused.  Review of systems:  Unable to reliably obtain 2/2 AMS, which is improving  Denies overt shortness of breath, CP or n/v --------------- Background on consult:  Theresa Blake is a 77 y.o. female with a history of HTN and DM who presented with confusion and hematuria.  She is altered and not able to provide any history.  I spoke with her son [name] (at [phone]) and he provided the history below.  He reports that she has been confused and "panicky" recently.  There has been blood in her urine and sediment for the past few weeks.  She has not been eating for the past 6 days but reports drinking some water.  Her son states that a week and a half ago she was "normal".  Per her son, she had uterine cancer and was treated 4-5 years ago at [facility] with radiation for the same.  Work-up is notable for UA with 6-10 RBC and 30 mg/dl protein.  CT a/p demonstrated moderate bilateral hydro without stone or clear source of obstruction.  CT also incidentally noted multiple bilateral pulmonary nodules which were characterized as concerning for metastatic disease.  Spoke with primary team and urology in ER and urology is planning for stents later today  Intake/Output Summary (Last 24 hours) at 10/22/2018 1157 Last data filed at 10/22/2018 0949 Gross per 24 hour  Intake 2704.95 ml  Output 2055 ml  Net 649.95 ml    Vitals:  Vitals:   10/21/18 1903 10/21/18 1955 10/22/18 0525 10/22/18 0722  BP: (!) 151/60 136/63 115/90 (!) 132/59  Pulse: 98 98 (!) 101 (!) 102  Resp: 19 18 19 18   Temp: 97.7 F (36.5 C) 98.2 F (36.8 C) 97.6 F (36.4 C) (!) 97.5 F (36.4 C)  TempSrc: Oral  Oral Oral Oral  SpO2: 90% 99% 97% 96%  Weight:  120.6 kg    Height:         Physical Exam:  General elderly female in bed in no acute distress HEENT normocephalic atraumatic extraocular movements intact sclera anicteric Neck supple trachea midline Lungs rare crackles; normal work of breathing at rest  Heart regular rate and rhythm no rubs or gallops appreciated Abdomen soft nontender nondistended Extremities no pitting edema  Psych normal mood and affect GU - foley in place  Neuro - oriented to person and hospital name but states it's 1970 and unsure of events leading to hospitalization  Medications reviewed   Labs:  BMP Latest Ref Rng & Units 10/22/2018 10/21/2018 10/21/2018  Glucose 70 - 99 mg/dL 94 164(H) 144(H)  BUN 8 - 23 mg/dL 87(H) 89(H) 93(H)  Creatinine 0.44 - 1.00 mg/dL 8.20(H) 8.62(H) 9.02(H)  Sodium 135 - 145 mmol/L 137 134(L) 136  Potassium 3.5 - 5.1 mmol/L 5.4(H) 5.1 5.0  Chloride 98 - 111 mmol/L 108 105 104  CO2 22 - 32 mmol/L 17(L) 14(L) 14(L)  Calcium 8.9 - 10.3 mg/dL 9.1 9.4 9.8     Assessment/Plan:   # AKI  - Secondary to obstruction - s/p bilateral ureteral stents - Follow after stenting to assess for need for dialysis.  Hopeful for continued improvement with supportive care   -  Can continue hydration as tolerated by respiratory status.  Agree with reducing rate to 75 ml/hr normal saline  # Obstructive uropathy - s/p stenting and with foley in place - Concern for possible recurrence of uterine cancer with metastatic disease  # CKD stage III  - Secondary to DM, HTN, as well as possible developing obstruction per last 06/2018 labs - Baseline Cr appears around 1.5 as above  # Hypotension - resolved - note hx of HTN  # AMS/encephalopathy - Secondary to acute illness - Supportive care - Family (son) is making decisions at this time  # Hyperkalemia  - Resolving  - kayexalate ordered per primary  - Start sodium bicarbonate  # Metabolic  acidosis  - Secondary to AKI as well as note metformin use in setting of AKI - Improving  - Start sodium bicarbonate  - Trend after stent placement   # Pulmonary nodules  - Concerning for metastatic disease per CT read - Per primary team   # DM  - Would hold metformin with AKI.  Would not resume on discharge  Spoke with primary team, Dr. Lendon Colonel, MD 10/22/2018 11:57 AM

## 2018-10-22 NOTE — Evaluation (Signed)
Clinical/Bedside Swallow Evaluation Patient Details  Name: Theresa Blake MRN: [id number] Date of Birth: 07/11/1941  Today's Date: 10/22/2018 Time: SLP Start Time (ACUTE ONLY): 0947 SLP Stop Time (ACUTE ONLY): 1000 SLP Time Calculation (min) (ACUTE ONLY): 13 min  Past Medical History:  Past Medical History:  Diagnosis Date  . Abnormal Pap smear of cervix   . Allergic rhinitis, cause unspecified   . Backache, unspecified   . Depression    mood disorder; ? bipolar  . Dermatomycosis, unspecified   . Diabetes mellitus type II   . Edema   . Foot fracture, right   . HLD (hyperlipidemia)   . HTN (hypertension)   . Hx pulmonary embolism    multiple  . Lumbar spinal stenosis   . Malignant neoplasm of corpus uteri, except isthmus ([place])   . Morbid obesity ([place])   . Other specified disease of hair and hair follicles   . Pain in joint, pelvic region and thigh   . Panic disorder without agoraphobia    Past Surgical History:  Past Surgical History:  Procedure Laterality Date  . BREAST BIOPSY     Right-benign  . CYSTOSCOPY W/ URETERAL STENT PLACEMENT Bilateral 10/21/2018   Procedure: CYSTOSCOPY WITH RETROGRADE PYELOGRAM/URETERAL STENT PLACEMENT;  Surgeon: Franchot Gallo, MD;  Location: [place];  Service: Urology;  Laterality: Bilateral;  . ENDOMETRIAL BIOPSY  08/2006   attempted  . IVC FILTER INSERTION N/A 11/17/2017   Procedure: IVC FILTER INSERTION;  Surgeon: Katha Cabal, MD;  Location: [place] [facility];  Service: Cardiovascular;  Laterality: N/A;  . LOWER EXTREMITY VENOGRAPHY N/A 11/17/2017   Procedure: LOWER EXTREMITY VENOGRAPHY;  Surgeon: Katha Cabal, MD;  Location: [place] [facility];  Service: Cardiovascular;  Laterality: N/A;  . TONSILLECTOMY    . TUBAL LIGATION     HPI:  77 year old female with past medical history of PE on Eliquis, hypertension, hyperlipidemia, diabetes mellitus, obesity, CKD 3, who presents with altered mental status, hematuria.  Has  some abdominal pain.  Dx bilateral ureteral obstruction with hydronephrosis and ARF. Underwent cystoscopy, bilateral ureteral stents 12/28. Pt also found to have possible pulmonary metastases  - hx of endometrial carcinoma previously treated with radiation; w/u underway. Other diagnoses include toxic metabolic encephalopathy, possible CAP    Assessment / Plan / Recommendation Clinical Impression  Despite impaired mentation, pt presents with functional swallowing with slowed but adequate oral phase, palpable swallow response, no s/s of aspiration, even with large, successive boluses of thin liquids.  Recommend giving meds whole in puree due to difficulty described by CNA; continue regular solids, thin liquids; assist with tray set up and feeding until mentation improves.  No acute care SLP needs identified - our service will sign off.  SLP Visit Diagnosis: Dysphagia, unspecified (R13.10)    Aspiration Risk  No limitations    Diet Recommendation   regular solids, thin liquids  Medication Administration: Whole meds with puree    Other  Recommendations Oral Care Recommendations: Oral care BID   Follow up Recommendations None      Frequency and Duration            Prognosis        Swallow Study   General Date of Onset: 10/21/18 HPI: 77 year old female with past medical history of PE on Eliquis, hypertension, hyperlipidemia, diabetes mellitus, obesity, CKD 3, who presents with altered mental status, hematuria.  Has some abdominal pain.  Dx bilateral ureteral obstruction with hydronephrosis and ARF. Underwent cystoscopy, bilateral ureteral stents  12/28. Pt also found to have possible pulmonary metastases  - hx of endometrial carcinoma previously treated with radiation; w/u underway. Other diagnoses include toxic metabolic encephalopathy, possible CAP  Type of Study: Bedside Swallow Evaluation Previous Swallow Assessment: no Diet Prior to this Study: Regular;Thin liquids Temperature Spikes  Noted: No Respiratory Status: Room air History of Recent Intubation: No Behavior/Cognition: Alert;Confused Oral Cavity Assessment: Within Functional Limits Oral Care Completed by SLP: No Oral Cavity - Dentition: Adequate natural dentition Self-Feeding Abilities: Needs assist;Needs set up Patient Positioning: Upright in bed Baseline Vocal Quality: Normal Volitional Cough: Strong Volitional Swallow: Able to elicit    Oral/Motor/Sensory Function Overall Oral Motor/Sensory Function: Within functional limits   Ice Chips Ice chips: Within functional limits   Thin Liquid Thin Liquid: Within functional limits    Nectar Thick Nectar Thick Liquid: Not tested   Honey Thick Honey Thick Liquid: Not tested   Puree Puree: Within functional limits   Solid     Solid: Within functional limits      Juan Quam Laurice 10/22/2018,10:02 AM    Estill Bamberg L. Tivis Ringer, [place] Office number [phone] Pager [phone]

## 2018-10-22 NOTE — Progress Notes (Signed)
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 Anesthetics, Halogenated Shortness Of Breath    10/22/18 Spoke to patients son.  Shortness of breath occurred with Ether when patient was a child.    Mack Hook [Levofloxacin In D5w] Itching  . Lamotrigine Itching  . Tramadol Rash    Patient Measurements: Height: 5\' 5"  (165.1 cm) Weight: 265 lb 14 oz (120.6 kg) IBW/kg (Calculated) : 57 Heparin Dosing Weight: 86 kg  Vital Signs: Temp: 98.3 F (36.8 C) (12/29 2042) Temp Source: Oral (12/29 2042) BP: 117/76 (12/29 2042) Pulse Rate: 104 (12/29 2042)  Labs: Recent Labs    10/21/18 0334 10/21/18 0405 10/21/18 1023 10/21/18 1928 10/22/18 0612 10/22/18 1225 10/22/18 2118  HGB 10.7*  --   --   --  9.3*  --   --   HCT 34.0*  --   --   --  29.7*  --   --   PLT 375  --   --   --  302  --   --   APTT  --   --   --   --   --  40* 67*  HEPARINUNFRC  --   --   --   --   --  1.94*  --   CREATININE 9.39*  --  9.02* 8.62* 8.20*  --   --   CKTOTAL  --   --  18*  --   --   --   --   TROPONINI  --  0.10*  --   --   --   --   --     Estimated Creatinine Clearance: 7.6 mL/min (A) (by C-G formula based on SCr of 8.2 mg/dL (H)).   Medical History: Past Medical History:  Diagnosis Date  . Abnormal Pap smear of cervix   . Allergic rhinitis, cause unspecified   . Backache, unspecified   . Depression    mood disorder; ? bipolar  . Dermatomycosis, unspecified   . Diabetes mellitus type II   . Edema   . Foot fracture, right   . HLD (hyperlipidemia)   . HTN (hypertension)   . Hx pulmonary embolism    multiple  . Lumbar spinal stenosis   . Malignant neoplasm of corpus uteri, except isthmus ([place])   . Morbid obesity ([place])   . Other specified disease of hair and hair follicles   . Pain in joint, pelvic region and thigh   . Panic disorder without agoraphobia     Medications:   Medications Prior to Admission  Medication Sig Dispense Refill Last Dose  . acetaminophen (TYLENOL) 325 MG tablet Take 650 mg by mouth every 6 (six) hours as needed for mild pain or headache.   Past Week at Unknown time  . aspirin EC 81 MG EC tablet Take 1 tablet (81 mg total) by mouth daily. 30 tablet 0 10/20/2018 at Unknown time  . buPROPion (WELLBUTRIN XL) 150 MG 24 hr tablet Take 300 mg by mouth daily.    10/20/2018 at Unknown time  . clonazePAM (KLONOPIN) 1 MG tablet Take 1 mg by mouth 2 (two) times daily.  0 10/20/2018 at Unknown time  . diclofenac sodium (VOLTAREN) 1 % GEL Apply 2 g topically every 8 (eight) hours as needed (lower back pain).   3 Past Week at Unknown time  . docusate sodium (COLACE) 100 MG capsule Take 100 capsules by mouth daily  as needed for mild constipation.    10/20/2018 at Unknown time  . ELIQUIS 2.5 MG TABS tablet Take 2.5 mg by mouth 2 (two) times daily.  1 10/20/2018 at 19:00  . ezetimibe-simvastatin (VYTORIN) 10-20 MG tablet Take 1 tablet by mouth at bedtime.    10/20/2018 at Unknown time  . fluticasone (FLONASE) 50 MCG/ACT nasal spray Place 2 sprays into both nostrils daily.   10/20/2018 at Unknown time  . levothyroxine (SYNTHROID, LEVOTHROID) 100 MCG tablet Take 1 tablet (100 mcg total) by mouth daily before breakfast. 30 tablet 0 10/20/2018 at Unknown time  . metFORMIN (GLUCOPHAGE) 500 MG tablet Take 500 mg by mouth daily.  3 10/20/2018 at Unknown time  . Multiple Vitamin (MULTIVITAMIN WITH MINERALS) TABS tablet Take 1 tablet by mouth daily.   10/20/2018 at Unknown time  . omeprazole (PRILOSEC) 20 MG capsule TAKE ONE CAPSULE BY MOUTH EVERY DAY *NEEDS OFFICE VISIT* (Patient taking differently: Take 20 mg by mouth daily. ) 30 capsule 0 10/20/2018 at Unknown time  . Venlafaxine HCl 225 MG TB24 Take 225 mg by mouth daily.    10/20/2018 at Unknown time  . furosemide (LASIX) 40 MG tablet Take 1 tablet (40 mg total) by mouth as directed. 1 tablet Twice a day for 4  days, from 07/12/2018 take one tablet daily. 60 tablet 0    Scheduled:  . aspirin  81 mg Oral Daily  . clonazePAM  1 mg Oral BID  . ezetimibe-simvastatin  1 tablet Oral QHS  . fluticasone  2 spray Each Nare Daily  . insulin aspart  0-9 Units Subcutaneous TID WC  . levothyroxine  100 mcg Oral QAC breakfast  . multivitamin with minerals  1 tablet Oral Daily  . pantoprazole  40 mg Oral Daily  . sodium bicarbonate  650 mg Oral TID  . venlafaxine XR  225 mg Oral Daily    Assessment: 77 y.o female morbidly obese w/ PMH DVT/PE on Eliquis, presents to ED 12/28 with AMS,  hematuria and abdominal discomfort, h/o endometrial cancer.  On Eliquis PTA, last dose taken pta was on 12/27 at 19:00.   Found to be in acute renal failure. Pharmacy consulted to start IV heparin while eliquis on hold due to acute renal failure. APTT came back therapeutic at 67, on 1100 units/hr. Hgb 9.3, plt 302. No s/sx of bleeding. No infusion issues.   Goal of Therapy:  Heparin level 0.3-0.7 units/ml aPTT 66-102 seconds Monitor platelets by anticoagulation protocol: Yes   Plan:  Monitor via aPTT due to PTA eliquis effect on heparin level Increase heparin drip to 1200 units/hr Daily aPTT, HL, CBC, monitor for s/sx of bleeding  Antonietta Jewel, PharmD, BCCCP Clinical Pharmacist  Pager: [phone] Phone: [phone] Please check AMION for all [place] phone numbers After 10:00 PM, call [place] [phone] 10/22/2018,10:25 PM

## 2018-10-22 NOTE — Progress Notes (Signed)
Placed mitts on patient due to pulling out two IVs, and picking at skin. Will continue to monitor.   Farley Ly RN

## 2018-10-23 ENCOUNTER — Inpatient Hospital Stay (HOSPITAL_COMMUNITY): Payer: Medicare Other

## 2018-10-23 LAB — BASIC METABOLIC PANEL
Anion gap: 13 (ref 5–15)
BUN: 73 mg/dL — AB (ref 8–23)
CO2: 16 mmol/L — ABNORMAL LOW (ref 22–32)
Calcium: 9.1 mg/dL (ref 8.9–10.3)
Chloride: 110 mmol/L (ref 98–111)
Creatinine, Ser: 7.12 mg/dL — ABNORMAL HIGH (ref 0.44–1.00)
GFR calc Af Amer: 6 mL/min — ABNORMAL LOW (ref >60)
GFR calc non Af Amer: 5 mL/min — ABNORMAL LOW (ref >60)
Glucose, Bld: 94 mg/dL (ref 70–99)
Potassium: 4.5 mmol/L (ref 3.5–5.1)
SODIUM: 139 mmol/L (ref 135–145)

## 2018-10-23 LAB — CBC
HCT: 30.6 % — ABNORMAL LOW (ref 36.0–46.0)
Hemoglobin: 9.6 g/dL — ABNORMAL LOW (ref 12.0–15.0)
MCH: 28.7 pg (ref 26.0–34.0)
MCHC: 31.4 g/dL (ref 30.0–36.0)
MCV: 91.6 fL (ref 80.0–100.0)
Platelets: 337 10*3/uL (ref 150–400)
RBC: 3.34 MIL/uL — ABNORMAL LOW (ref 3.87–5.11)
RDW: 18.3 % — AB (ref 11.5–15.5)
WBC: 9.1 10*3/uL (ref 4.0–10.5)
nRBC: 0 % (ref 0.0–0.2)

## 2018-10-23 LAB — GLUCOSE, CAPILLARY
Glucose-Capillary: 107 mg/dL — ABNORMAL HIGH (ref 70–99)
Glucose-Capillary: 56 mg/dL — ABNORMAL LOW (ref 70–99)
Glucose-Capillary: 88 mg/dL (ref 70–99)
Glucose-Capillary: 107 mg/dL — ABNORMAL HIGH (ref 70–99)
Glucose-Capillary: 90 mg/dL (ref 70–99)
Glucose-Capillary: 97 mg/dL (ref 70–99)

## 2018-10-23 LAB — APTT: aPTT: 80 seconds — ABNORMAL HIGH (ref 24–36)

## 2018-10-23 LAB — HEPARIN LEVEL (UNFRACTIONATED): Heparin Unfractionated: 1.18 IU/mL — ABNORMAL HIGH (ref 0.30–0.70)

## 2018-10-23 MED ORDER — WHITE PETROLATUM EX OINT
TOPICAL_OINTMENT | CUTANEOUS | Status: AC
  Administered 2018-10-23: 0.2
  Filled 2018-10-23: qty 28.35

## 2018-10-23 MED ORDER — CLONAZEPAM 0.5 MG PO TABS
0.5000 mg | ORAL_TABLET | Freq: Two times a day (BID) | ORAL | Status: DC
  Administered 2018-10-23 – 2018-10-31 (×16): 0.5 mg via ORAL
  Filled 2018-10-23 (×16): qty 1

## 2018-10-23 MED ORDER — SODIUM CHLORIDE 0.9 % IV SOLN
INTRAVENOUS | Status: DC
  Administered 2018-10-23 (×2): via INTRAVENOUS

## 2018-10-23 MED ORDER — SODIUM BICARBONATE 650 MG PO TABS
1300.0000 mg | ORAL_TABLET | Freq: Two times a day (BID) | ORAL | Status: DC
  Administered 2018-10-23 – 2018-10-27 (×9): 1300 mg via ORAL
  Filled 2018-10-23 (×9): qty 2

## 2018-10-23 NOTE — Progress Notes (Signed)
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 Anesthetics, Halogenated Shortness Of Breath    10/22/18 Spoke to patients son.  Shortness of breath occurred with Ether when patient was a child.    Mack Hook [Levofloxacin In D5w] Itching  . Lamotrigine Itching  . Tramadol Rash    Patient Measurements: Height: 5\' 5"  (165.1 cm) Weight: 260 lb 9.3 oz (118.2 kg) IBW/kg (Calculated) : 57 Heparin Dosing Weight: 86 kg  Vital Signs: Temp: 98.3 F (36.8 C) (12/30 0925) Temp Source: Oral (12/30 0925) BP: 148/72 (12/30 0925) Pulse Rate: 100 (12/30 0925)  Labs: Recent Labs    10/21/18 0334 10/21/18 0405 10/21/18 1023 10/21/18 1928 10/22/18 0612 10/22/18 1225 10/22/18 2118 10/23/18 0622  HGB 10.7*  --   --   --  9.3*  --   --  9.6*  HCT 34.0*  --   --   --  29.7*  --   --  30.6*  PLT 375  --   --   --  302  --   --  337  APTT  --   --   --   --   --  40* 67* 80*  HEPARINUNFRC  --   --   --   --   --  1.94*  --  1.18*  CREATININE 9.39*  --  9.02* 8.62* 8.20*  --   --  7.12*  CKTOTAL  --   --  18*  --   --   --   --   --   TROPONINI  --  0.10*  --   --   --   --   --   --     Estimated Creatinine Clearance: 8.6 mL/min (A) (by C-G formula based on SCr of 7.12 mg/dL (H)).   Medical History: Past Medical History:  Diagnosis Date  . Abnormal Pap smear of cervix   . Allergic rhinitis, cause unspecified   . Backache, unspecified   . Depression    mood disorder; ? bipolar  . Dermatomycosis, unspecified   . Diabetes mellitus type II   . Edema   . Foot fracture, right   . HLD (hyperlipidemia)   . HTN (hypertension)   . Hx pulmonary embolism    multiple  . Lumbar spinal stenosis   . Malignant neoplasm of corpus uteri, except isthmus ([place])   . Morbid obesity ([place])   . Other specified disease of hair and hair follicles   . Pain in joint, pelvic region and thigh   .  Panic disorder without agoraphobia     Medications:  Medications Prior to Admission  Medication Sig Dispense Refill Last Dose  . acetaminophen (TYLENOL) 325 MG tablet Take 650 mg by mouth every 6 (six) hours as needed for mild pain or headache.   Past Week at Unknown time  . aspirin EC 81 MG EC tablet Take 1 tablet (81 mg total) by mouth daily. 30 tablet 0 10/20/2018 at Unknown time  . buPROPion (WELLBUTRIN XL) 150 MG 24 hr tablet Take 300 mg by mouth daily.    10/20/2018 at Unknown time  . clonazePAM (KLONOPIN) 1 MG tablet Take 1 mg by mouth 2 (two) times daily.  0 10/20/2018 at Unknown time  . diclofenac sodium (VOLTAREN) 1 % GEL Apply 2 g topically every 8 (eight) hours as needed (lower back pain).   3 Past Week at Unknown time  .  docusate sodium (COLACE) 100 MG capsule Take 100 capsules by mouth daily as needed for mild constipation.    10/20/2018 at Unknown time  . ELIQUIS 2.5 MG TABS tablet Take 2.5 mg by mouth 2 (two) times daily.  1 10/20/2018 at 19:00  . ezetimibe-simvastatin (VYTORIN) 10-20 MG tablet Take 1 tablet by mouth at bedtime.    10/20/2018 at Unknown time  . fluticasone (FLONASE) 50 MCG/ACT nasal spray Place 2 sprays into both nostrils daily.   10/20/2018 at Unknown time  . levothyroxine (SYNTHROID, LEVOTHROID) 100 MCG tablet Take 1 tablet (100 mcg total) by mouth daily before breakfast. 30 tablet 0 10/20/2018 at Unknown time  . metFORMIN (GLUCOPHAGE) 500 MG tablet Take 500 mg by mouth daily.  3 10/20/2018 at Unknown time  . Multiple Vitamin (MULTIVITAMIN WITH MINERALS) TABS tablet Take 1 tablet by mouth daily.   10/20/2018 at Unknown time  . omeprazole (PRILOSEC) 20 MG capsule TAKE ONE CAPSULE BY MOUTH EVERY DAY *NEEDS OFFICE VISIT* (Patient taking differently: Take 20 mg by mouth daily. ) 30 capsule 0 10/20/2018 at Unknown time  . Venlafaxine HCl 225 MG TB24 Take 225 mg by mouth daily.    10/20/2018 at Unknown time  . furosemide (LASIX) 40 MG tablet Take 1 tablet (40 mg  total) by mouth as directed. 1 tablet Twice a day for 4 days, from 07/12/2018 take one tablet daily. 60 tablet 0    Scheduled:  . aspirin  81 mg Oral Daily  . clonazePAM  0.5 mg Oral BID  . ezetimibe-simvastatin  1 tablet Oral QHS  . fluticasone  2 spray Each Nare Daily  . levothyroxine  100 mcg Oral QAC breakfast  . multivitamin with minerals  1 tablet Oral Daily  . pantoprazole  40 mg Oral Daily  . sodium bicarbonate  1,300 mg Oral BID  . venlafaxine XR  225 mg Oral Daily    Assessment: 77 y.o female morbidly obese w/ PMH DVT/PE on Eliquis, presents to ED 12/28 with AMS,  hematuria and abdominal discomfort, h/o endometrial cancer.  On Eliquis PTA, last dose taken pta was on 12/27 at 19:00.   Found to be in acute renal failure. Pharmacy consulted to start IV heparin while eliquis on hold due to acute renal failure. APTT came back therapeutic at 80, on 1200 units/hr. Hgb 9.6, plt 337. No s/sx of bleeding. No infusion issues. Heparin level elevated due to effect of apixaban. Will use APTT to dose heparin until these correlate.    Goal of Therapy:  Heparin level 0.3-0.7 units/ml aPTT 66-102 seconds Monitor platelets by anticoagulation protocol: Yes   Plan:  Monitor via aPTT due to PTA eliquis effect on heparin level Continue heparin drip at 1200 units/hr Daily aPTT, HL, CBC, monitor for s/sx of bleeding  Jamus Loving A. Levada Dy, PharmD, [place] Pager: [phone] Please utilize Amion for appropriate phone number to reach the unit pharmacist ([place])   10/23/2018,10:19 AM

## 2018-10-23 NOTE — Progress Notes (Signed)
PROGRESS NOTE                                                                                                                                                                                                             Patient Demographics:    Theresa Blake, is a 77 y.o. female, DOB - 24-Feb-1941, QZE:[id number]  Admit date - 10/21/2018   Admitting Physician Courage Denton Brick, MD  Outpatient Primary MD for the patient is Tracie Harrier, MD  LOS - 2  Chief Complaint  Patient presents with  . Altered Mental Status       Brief Narrative  Theresa Blake  is a 77 yo morbidly obese female with past medical history of DVT/PE on Eliquis, hypertension, hyperlipidemia, diabetes mellitus,  CKD III (baseline Cr 1.5), who presents with altered mental status, hematuria and abdominal discomfort, endometrial cancer was brought in for decreased mental status in the hospital found to be in acute renal failure, hyperkalemic, bilateral hydronephrosis with ureteric obstruction.  Nephrology and urology were consulted and we were requested to admit.    Subjective:   Patient in bed, appears comfortable, denies any headache, no fever, no chest pain or pressure, no shortness of breath , no abdominal pain. No focal weakness.    Assessment  & Plan :     1.  ARF, hyperkalemia due to bilateral hydronephrosis - this most likely is due to underlying progression of endometrial cancer which she has history of, she has been seen by urology and nephrology, she underwent cystoscopy with bilateral ureteric stent placement by Dr. Diona Fanti urology on 10/21/2018.  She has been maintained on IV fluids, urine output is good, renal function and hyperkalemia are improving, discussed the case with nephrologist Dr. Royce Macadamia on 10/23/2018.  Continue to monitor closely.  Still quite sick.  2.  History of endometrial cancer now possibility of pulmonary mets and bilateral hydronephrosis.  Once stable outpatient oncology  follow-up, called son [name] with no response.  Would like to discuss future line of care and CODE STATUS.  3.  History of PE.  Was on Eliquis currently will place her on heparin drip due to renal function and monitor.  4.  Acute metabolic acidosis.  Due to #1 above.  Placed on oral bicarb.  Monitor closely.  5.  Metabolic encephalopathy.  Due to #1 above.  Improving gradually.  6.  Hypothyroidism.  On Synthroid continue.  7.  GERD.  PPI.  8.  Dyslipidemia.  On statin continue.  9.  Possible pneumonia.  On Rocephin and azithromycin, could be pulmonary edema.  Will monitor clinically.  10. DM 2 - ISS, stop nighttime insulin due to early morning hypoglycemia.  CBG (last 3)  Recent Labs    10/22/18 2111 10/23/18 0744 10/23/18 0811  GLUCAP 107* 56* 88   Lab Results  Component Value Date   HGBA1C 6.3 (H) 07/03/2018      Family Communication  :   Both sons over the phone on 10/22/2018.  Code Status :  Full  Disposition Plan  :  TBD  Consults  :  Renal, Urology  Procedures  :    CT - 1. Moderate bilateral hydroureteronephrosis. No nephroureterolithiasis or other clear source of obstruction. 2. Multiple bibasilar pulmonary nodules, measuring up to 10 mm, new since 11/18/17. These are concerning for metastatic disease. 3. Limited assessment of the uterine parenchyma without IV contrast material. Generally unchanged appearance of anterior fundal region of hypoattenuation. Aortic Atherosclerosis  DVT Prophylaxis  :  Hep gtt  Lab Results  Component Value Date   PLT 337 10/23/2018    Diet :  Diet Order            Diet renal/carb modified with fluid restriction Diet-HS Snack? Nothing; Fluid restriction: Other (see comments); Room service appropriate? Yes; Fluid consistency: Thin  Diet effective now               Inpatient Medications Scheduled Meds: . aspirin  81 mg Oral Daily  . clonazePAM  1 mg Oral BID  . ezetimibe-simvastatin  1 tablet Oral QHS  . fluticasone  2  spray Each Nare Daily  . insulin aspart  0-9 Units Subcutaneous TID WC  . levothyroxine  100 mcg Oral QAC breakfast  . multivitamin with minerals  1 tablet Oral Daily  . pantoprazole  40 mg Oral Daily  . sodium bicarbonate  1,300 mg Oral BID  . venlafaxine XR  225 mg Oral Daily   Continuous Infusions: . sodium chloride 75 mL/hr at 10/22/18 1246  . albuterol 10 mg/hr (10/21/18 0653)  . azithromycin 500 mg (10/23/18 0905)  . cefTRIAXone (ROCEPHIN)  IV 1 g (10/23/18 0527)  . heparin 1,200 Units/hr (10/22/18 2234)   PRN Meds:.acetaminophen **OR** [DISCONTINUED] acetaminophen, albuterol, [DISCONTINUED] ondansetron **OR** ondansetron (ZOFRAN) IV, polyethylene glycol, traZODone  Antibiotics  :   Anti-infectives (From admission, onward)   Start     Dose/Rate Route Frequency Ordered Stop   10/22/18 0600  cefTRIAXone (ROCEPHIN) 1 g in sodium chloride 0.9 % 100 mL IVPB     1 g 200 mL/hr over 30 Minutes Intravenous Every 24 hours 10/21/18 1438     10/22/18 0600  azithromycin (ZITHROMAX) 500 mg in sodium chloride 0.9 % 250 mL IVPB     500 mg 250 mL/hr over 60 Minutes Intravenous Every 24 hours 10/21/18 1438     10/21/18 0445  cefTRIAXone (ROCEPHIN) 1 g in sodium chloride 0.9 % 100 mL IVPB     1 g 200 mL/hr over 30 Minutes Intravenous  Once 10/21/18 0435 10/21/18 0540   10/21/18 0445  azithromycin (ZITHROMAX) 500 mg in sodium chloride 0.9 % 250 mL IVPB     500 mg 250 mL/hr over 60 Minutes Intravenous  Once 10/21/18 0435 10/21/18 0607        Objective:   Vitals:   10/22/18 2042 10/23/18 0355 10/23/18 0438 10/23/18 0925  BP: 117/76 126/69  (!) 148/72  Pulse: (!) 104 76  100  Resp: (!) 22 (!) 22  20  Temp: 98.3 F (36.8 C) (!) 97.4 F (36.3 C)  98.3 F (36.8 C)  TempSrc: Oral Oral  Oral  SpO2: 97% 98%  (!) 81%  Weight: 118.2 kg  118.2 kg   Height:        Wt Readings from Last 3 Encounters:  10/23/18 118.2 kg  07/08/18 114.4 kg  11/30/17 (!) 142.4 kg     Intake/Output Summary  (Last 24 hours) at 10/23/2018 0933 Last data filed at 10/23/2018 0600 Gross per 24 hour  Intake 2496 ml  Output 2901 ml  Net -405 ml     Physical Exam  Awake , mildly confused, has her extremity tremors right more than left, Foley catheter in place [place].AT,PERRAL Supple Neck,No JVD, No cervical lymphadenopathy appriciated.  Symmetrical Chest wall movement, Good air movement bilaterally, few rales RRR,No Gallops, Rubs or new Murmurs, No Parasternal Heave +ve B.Sounds, Abd Soft, No tenderness, No organomegaly appriciated, No rebound - guarding or rigidity. No Cyanosis, Clubbing or edema, No new Rash or bruise    Data Review:    CBC Recent Labs  Lab 10/21/18 0334 10/22/18 0612 10/23/18 0622  WBC 9.2 10.0 9.1  HGB 10.7* 9.3* 9.6*  HCT 34.0* 29.7* 30.6*  PLT 375 302 337  MCV 91.9 92.2 91.6  MCH 28.9 28.9 28.7  MCHC 31.5 31.3 31.4  RDW 17.8* 18.2* 18.3*  LYMPHSABS 1.4  --   --   MONOABS 1.0  --   --   EOSABS 0.5  --   --   BASOSABS 0.1  --   --     Chemistries  Recent Labs  Lab 10/21/18 0334 10/21/18 1023 10/21/18 1928 10/22/18 0612 10/23/18 0622  NA 134* 136 134* 137 139  K 6.0* 5.0 5.1 5.4* 4.5  CL 102 104 105 108 110  CO2 15* 14* 14* 17* 16*  GLUCOSE 75 144* 164* 94 94  BUN 98* 93* 89* 87* 73*  CREATININE 9.39* 9.02* 8.62* 8.20* 7.12*  CALCIUM 10.0 9.8 9.4 9.1 9.1  AST 8*  --   --   --   --   ALT 5  --   --   --   --   ALKPHOS 77  --   --   --   --   BILITOT 0.5  --   --   --   --    ------------------------------------------------------------------------------------------------------------------ No results for input(s): CHOL, HDL, LDLCALC, TRIG, CHOLHDL, LDLDIRECT in the last 72 hours.  Lab Results  Component Value Date   HGBA1C 6.3 (H) 07/03/2018   ------------------------------------------------------------------------------------------------------------------ No results for input(s): TSH, T4TOTAL, T3FREE, THYROIDAB in the last 72  hours.  Invalid input(s): FREET3 ------------------------------------------------------------------------------------------------------------------ No results for input(s): VITAMINB12, FOLATE, FERRITIN, TIBC, IRON, RETICCTPCT in the last 72 hours.  Coagulation profile No results for input(s): INR, PROTIME in the last 168 hours.  No results for input(s): DDIMER in the last 72 hours.  Cardiac Enzymes Recent Labs  Lab 10/21/18 0405  TROPONINI 0.10*   ------------------------------------------------------------------------------------------------------------------    Component Value Date/Time   BNP 1,250.6 (H) 10/21/2018 1610    Micro Results Recent Results (from the past 240 hour(s))  Blood Culture (routine x 2)     Status: None (Preliminary result)   Collection Time: 10/21/18  3:15 AM  Result Value Ref Range Status   Specimen Description BLOOD RIGHT ARM  Final   Special Requests   Final    BOTTLES DRAWN AEROBIC AND ANAEROBIC Blood Culture adequate volume   Culture  Setup Time NO ORGANISMS SEEN  Final   Culture   Final    NO GROWTH 1 DAY Performed at [facility], [place] [address] [place], [place] [address]    Report Status PENDING  Incomplete  Blood Culture (routine x 2)     Status: None (Preliminary result)   Collection Time: 10/21/18  3:30 AM  Result Value Ref Range Status   Specimen Description BLOOD RIGHT HAND  Final   Special Requests   Final    BOTTLES DRAWN AEROBIC ONLY Blood Culture adequate volume   Culture   Final    NO GROWTH 1 DAY Performed at [facility], [place] [address], [place], [place] [address]    Report Status PENDING  Incomplete  Urine culture     Status: None   Collection Time: 10/21/18  5:51 AM  Result Value Ref Range Status   Specimen Description URINE, CATHETERIZED  Final   Special Requests NONE  Final   Culture   Final    NO GROWTH Performed at [facility], [address] [place], [place] [address]    Report Status  10/22/2018 FINAL  Final  MRSA PCR Screening     Status: Abnormal   Collection Time: 10/22/18  8:08 AM  Result Value Ref Range Status   MRSA by PCR POSITIVE (A) NEGATIVE Final    Comment:        The GeneXpert MRSA Assay (FDA approved for NASAL specimens only), is one component of a comprehensive MRSA colonization surveillance program. It is not intended to diagnose MRSA infection nor to guide or monitor treatment for MRSA infections. CRITICAL RESULT CALLED TO, READ BACK BY AND VERIFIED WITH: L. BENNET, RN AT 0945 ON 10/22/18 BY C. JESSUP, MLT. Performed at [facility], [place] [address], [place], [place] [address]     Radiology Reports Ct Abdomen Pelvis Wo Contrast  Result Date: 10/21/2018 CLINICAL DATA:  Abdominal pain and confusion. EXAM: CT ABDOMEN AND PELVIS WITHOUT CONTRAST TECHNIQUE: Multidetector CT imaging of the abdomen and pelvis was performed following the standard protocol without IV contrast. COMPARISON:  CT abdomen pelvis 11/18/2017 FINDINGS: LOWER CHEST: Multiple bibasilar pulmonary nodules, measuring up to 10 mm. HEPATOBILIARY: The hepatic contours and density are normal. There is no intra- or extrahepatic biliary dilatation. The gallbladder is normal. PANCREAS: The pancreatic parenchymal contours are normal and there is no ductal dilatation. There is no peripancreatic fluid collection. SPLEEN: Normal. ADRENALS/URINARY TRACT: --Adrenal glands: Normal. --Right kidney/ureter: There is moderate hydroureteronephrosis. No nephroureterolithiasis or other clear source of obstruction. --Left kidney/ureter: There is moderate hydroureteronephrosis. No nephroureterolithiasis or other clear source of obstruction. --Urinary bladder: Urinary bladder is decompressed. STOMACH/BOWEL: --Stomach/Duodenum: There is no hiatal hernia or other gastric abnormality. The duodenal course and caliber are normal. --Small bowel: No dilatation or inflammation. --Colon: No focal abnormality. --Appendix:  Not visualized. No right lower quadrant inflammation or free fluid. VASCULAR/LYMPHATIC: Atherosclerotic calcification is present within the non-aneurysmal abdominal aorta, without hemodynamically significant stenosis. No abdominal or pelvic lymphadenopathy. REPRODUCTIVE: There is a T-shaped contraceptive device within the uterus. Area of hypoattenuation at the anterior uterine fundus appears unchanged, though poorly characterized without IV contrast MUSCULOSKELETAL. Multilevel degenerative disc disease and facet arthrosis. No bony spinal canal stenosis. OTHER: None. IMPRESSION: 1. Moderate bilateral hydroureteronephrosis. No nephroureterolithiasis or other clear source of obstruction. 2. Multiple bibasilar pulmonary nodules, measuring up to 10 mm, new since 11/18/17. These are concerning for metastatic disease. 3. Limited assessment of the uterine  parenchyma without IV contrast material. Generally unchanged appearance of anterior fundal region of hypoattenuation. Aortic Atherosclerosis (ICD10-I70.0). Electronically Signed   By: Ulyses Jarred M.D.   On: 10/21/2018 06:02   Dg Chest 2 View  Result Date: 10/21/2018 CLINICAL DATA:  Acute onset of altered mental status. Confusion. EXAM: CHEST - 2 VIEW COMPARISON:  Chest radiograph performed 07/06/2018, and CTA of the chest performed 07/03/2018 FINDINGS: The lungs are well-aerated. Patchy bilateral airspace opacification is similar in appearance to prior studies and may reflect recurrent pulmonary edema or possibly pneumonia. A small left pleural effusion is again noted. No pneumothorax is seen. The cardiomediastinal silhouette is borderline normal in size. There appears to be a mildly displaced fracture of the left lateral fifth rib. IMPRESSION: 1. Patchy bilateral airspace opacification is similar in appearance to prior studies and may reflect recurrent pulmonary edema or possibly pneumonia. Small left pleural effusion again noted. 2. Apparent mildly displaced  fracture of the left lateral fifth rib. Electronically Signed   By: Garald Balding M.D.   On: 10/21/2018 04:24   Ct Head Wo Contrast  Result Date: 10/21/2018 CLINICAL DATA:  Acute onset of worsening confusion. Hematuria. EXAM: CT HEAD WITHOUT CONTRAST TECHNIQUE: Contiguous axial images were obtained from the base of the skull through the vertex without intravenous contrast. COMPARISON:  CT of the head performed 04/24/2018 FINDINGS: Brain: No evidence of acute infarction, hemorrhage, hydrocephalus, extra-axial collection or mass lesion / mass effect. Prominence of the ventricles and sulci reflects mild cortical volume loss. Diffuse periventricular and subcortical white matter change likely reflects small vessel ischemic microangiopathy. Chronic ischemic change is noted at the basal ganglia bilaterally. Mild cerebellar atrophy is noted. The brainstem and fourth ventricle are within normal limits. The basal ganglia are unremarkable in appearance. The cerebral hemispheres demonstrate grossly normal gray-white differentiation. No mass effect or midline shift is seen. Vascular: No hyperdense vessel or unexpected calcification. Skull: There is no evidence of fracture; visualized osseous structures are unremarkable in appearance. Sinuses/Orbits: The orbits are within normal limits. The paranasal sinuses and mastoid air cells are well-aerated. Other: No significant soft tissue abnormalities are seen. IMPRESSION: 1. No acute intracranial pathology seen on CT. 2. Mild cortical volume loss and diffuse small vessel ischemic microangiopathy. 3. Chronic ischemic change at the basal ganglia bilaterally. Electronically Signed   By: Garald Balding M.D.   On: 10/21/2018 05:55   Dg Chest Port 1 View  Result Date: 10/23/2018 CLINICAL DATA:  Shortness of breath. EXAM: PORTABLE CHEST 1 VIEW COMPARISON:  Radiographs of October 21, 2018. FINDINGS: Stable cardiomediastinal silhouette. No pneumothorax or pleural effusion is noted.  Right lung is clear. Patchy opacity is noted in left midlung laterally concerning for possible pneumonia. Bony thorax is unremarkable. IMPRESSION: Probable left midlung pneumonia. Followup PA and lateral chest X-ray is recommended in 3-4 weeks following trial of antibiotic therapy to ensure resolution and exclude underlying malignancy. Electronically Signed   By: Marijo Conception, M.D.   On: 10/23/2018 07:54   Dg C-arm 1-60 Min-no Report  Result Date: 10/21/2018 Fluoroscopy was utilized by the requesting physician.  No radiographic interpretation.    Time Spent in minutes  30   Lala Lund M.D on 10/23/2018 at 9:33 AM  To page go to [email or web address] - password [facility]

## 2018-10-23 NOTE — Progress Notes (Signed)
[facility] Progress Note  Name: [name] MRN: [id number] DOB: Jan 25, 1941  Chief Complaint:  AMS, hematuria  Subjective:   Patient had 3.4 L UOP over 12/29.  She slept better but has still been confused.   Review of systems:  Unable to reliably obtain 2/2 AMS, which is improving  She denies shortness of breath, CP or n/v Urinating via foley  --------------- Background on consult:  Theresa Blake is a 77 y.o. female with a history of HTN and DM who presented with confusion and hematuria.  She is altered and not able to provide any history.  I spoke with her son [name] (at [phone]) and he provided the history below.  He reports that she has been confused and "panicky" recently.  There has been blood in her urine and sediment for the past few weeks.  She has not been eating for the past 6 days but reports drinking some water.  Her son states that a week and a half ago she was "normal".  Per her son, she had uterine cancer and was treated 4-5 years ago at [facility] with radiation for the same.  Work-up is notable for UA with 6-10 RBC and 30 mg/dl protein.  CT a/p demonstrated moderate bilateral hydro without stone or clear source of obstruction.  CT also incidentally noted multiple bilateral pulmonary nodules which were characterized as concerning for metastatic disease.  Spoke with primary team and urology in ER and urology is planning for stents later today  Intake/Output Summary (Last 24 hours) at 10/23/2018 0709 Last data filed at 10/23/2018 0500 Gross per 24 hour  Intake 2108 ml  Output 3401 ml  Net -1293 ml    Vitals:  Vitals:   10/22/18 1806 10/22/18 2042 10/23/18 0355 10/23/18 0438  BP: (!) 144/62 117/76 126/69   Pulse: 98 (!) 104 76   Resp: 19 (!) 22 (!) 22   Temp:  98.3 F (36.8 C) (!) 97.4 F (36.3 C)   TempSrc:  Oral Oral   SpO2: 95% 97% 98%   Weight:  118.2 kg  118.2 kg  Height:         Physical Exam:   General elderly female in bed in no acute  distress HEENT normocephalic atraumatic extraocular movements intact sclera anicteric Neck supple trachea midline Lungs rare crackles; normal work of breathing at rest  Heart regular rate and rhythm no rubs or gallops appreciated Abdomen soft nontender nondistended Extremities no pitting edema  Psych normal mood and affect GU - foley in place  Neuro - more conversant, oriented to person and location  Medications reviewed   Labs:  BMP Latest Ref Rng & Units 10/23/2018 10/22/2018 10/21/2018  Glucose 70 - 99 mg/dL 94 94 164(H)  BUN 8 - 23 mg/dL 73(H) 87(H) 89(H)  Creatinine 0.44 - 1.00 mg/dL 7.12(H) 8.20(H) 8.62(H)  Sodium 135 - 145 mmol/L 139 137 134(L)  Potassium 3.5 - 5.1 mmol/L 4.5 5.4(H) 5.1  Chloride 98 - 111 mmol/L 110 108 105  CO2 22 - 32 mmol/L 16(L) 17(L) 14(L)  Calcium 8.9 - 10.3 mg/dL 9.1 9.1 9.4     Assessment/Plan:   # AKI  - Secondary to obstruction - s/p bilateral ureteral stents   - She is improving with supportive care  - Continue hydration with normal saline    # Obstructive uropathy - s/p stenting and with foley in place - Concern for possible recurrence of uterine cancer with metastatic disease  # CKD stage III  -  Secondary to DM, HTN, as well as possible developing obstruction per last 06/2018 labs - Baseline Cr appears around 1.5 as above  # Metabolic acidosis  - Secondary to AKI as well as note metformin use in setting of AKI  - Continue oral sodium bicarbonate and supportive care for AKI  - lactic acid 2.9 on last check  # HTN  - Acceptable control; hypotension resolved  # AMS/encephalopathy - Secondary to acute illness - Supportive care - Family (son) is making decisions at this time  # Hyperkalemia  - Resolved s/p kayexalate, lokelma, and sodium bicarb  # Pulmonary nodules  - Concerning for metastatic disease per CT read - Per primary team   # DM  - Would hold metformin with AKI.  Would not resume on discharge    Claudia Desanctis, MD 10/23/2018 7:09 AM

## 2018-10-24 LAB — BASIC METABOLIC PANEL
ANION GAP: 12 (ref 5–15)
BUN: 63 mg/dL — ABNORMAL HIGH (ref 8–23)
CO2: 16 mmol/L — ABNORMAL LOW (ref 22–32)
Calcium: 9.1 mg/dL (ref 8.9–10.3)
Chloride: 111 mmol/L (ref 98–111)
Creatinine, Ser: 6.12 mg/dL — ABNORMAL HIGH (ref 0.44–1.00)
GFR calc non Af Amer: 6 mL/min — ABNORMAL LOW (ref >60)
GFR, EST AFRICAN AMERICAN: 7 mL/min — AB (ref >60)
Glucose, Bld: 100 mg/dL — ABNORMAL HIGH (ref 70–99)
Potassium: 4.2 mmol/L (ref 3.5–5.1)
Sodium: 139 mmol/L (ref 135–145)

## 2018-10-24 LAB — CBC
HCT: 31.1 % — ABNORMAL LOW (ref 36.0–46.0)
Hemoglobin: 9.5 g/dL — ABNORMAL LOW (ref 12.0–15.0)
MCH: 27.8 pg (ref 26.0–34.0)
MCHC: 30.5 g/dL (ref 30.0–36.0)
MCV: 90.9 fL (ref 80.0–100.0)
Platelets: 341 10*3/uL (ref 150–400)
RBC: 3.42 MIL/uL — ABNORMAL LOW (ref 3.87–5.11)
RDW: 18.5 % — ABNORMAL HIGH (ref 11.5–15.5)
WBC: 11 10*3/uL — AB (ref 4.0–10.5)
nRBC: 0 % (ref 0.0–0.2)

## 2018-10-24 LAB — GLUCOSE, CAPILLARY
GLUCOSE-CAPILLARY: 109 mg/dL — AB (ref 70–99)
Glucose-Capillary: 94 mg/dL (ref 70–99)
Glucose-Capillary: 115 mg/dL — ABNORMAL HIGH (ref 70–99)
Glucose-Capillary: 102 mg/dL — ABNORMAL HIGH (ref 70–99)

## 2018-10-24 LAB — HEPARIN LEVEL (UNFRACTIONATED): Heparin Unfractionated: 0.74 IU/mL — ABNORMAL HIGH (ref 0.30–0.70)

## 2018-10-24 LAB — APTT
APTT: 62 s — AB (ref 24–36)
aPTT: 76 seconds — ABNORMAL HIGH (ref 24–36)

## 2018-10-24 MED ORDER — SODIUM CHLORIDE 0.9 % IV SOLN
INTRAVENOUS | Status: DC
  Administered 2018-10-24 – 2018-10-29 (×7): via INTRAVENOUS

## 2018-10-24 MED ORDER — MUPIROCIN 2 % EX OINT
1.0000 "application " | TOPICAL_OINTMENT | Freq: Two times a day (BID) | CUTANEOUS | Status: AC
  Administered 2018-10-24 – 2018-10-28 (×10): 1 via NASAL
  Filled 2018-10-24 (×2): qty 22

## 2018-10-24 MED ORDER — CHLORHEXIDINE GLUCONATE CLOTH 2 % EX PADS
6.0000 | MEDICATED_PAD | Freq: Every day | CUTANEOUS | Status: AC
  Administered 2018-10-24 – 2018-10-28 (×5): 6 via TOPICAL

## 2018-10-24 MED ORDER — VENLAFAXINE HCL ER 150 MG PO CP24
150.0000 mg | ORAL_CAPSULE | Freq: Every day | ORAL | Status: DC
  Administered 2018-10-25 – 2018-10-31 (×7): 150 mg via ORAL
  Filled 2018-10-24 (×7): qty 1

## 2018-10-24 NOTE — Progress Notes (Signed)
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 Anesthetics, Halogenated Shortness Of Breath    10/22/18 Spoke to patients son.  Shortness of breath occurred with Ether when patient was a child.    [place] [Levofloxacin In D5w] Itching  . Lamotrigine Itching  . Tramadol Rash    Patient Measurements: Height: 5\' 5"  (165.1 cm) Weight: 260 lb 9.3 oz (118.2 kg) IBW/kg (Calculated) : 57 Heparin Dosing Weight: 86 kg  Vital Signs: Temp: 98.3 F (36.8 C) (12/31 1623) Temp Source: Oral (12/31 1623) BP: 139/79 (12/31 1623) Pulse Rate: 95 (12/31 1623)  Labs: Recent Labs    10/22/18 0612 10/22/18 1225  10/23/18 0622 10/24/18 0758 10/24/18 2041  HGB 9.3*  --   --  9.6* 9.5*  --   HCT 29.7*  --   --  30.6* 31.1*  --   PLT 302  --   --  337 341  --   APTT  --  40*   < > 80* 62* 76*  HEPARINUNFRC  --  1.94*  --  1.18* 0.74*  --   CREATININE 8.20*  --   --  7.12* 6.12*  --    < > = values in this interval not displayed.    Estimated Creatinine Clearance: 10.1 mL/min (A) (by C-G formula based on SCr of 6.12 mg/dL (H)).   Medical History: Past Medical History:  Diagnosis Date  . Abnormal Pap smear of cervix   . Allergic rhinitis, cause unspecified   . Backache, unspecified   . Depression    mood disorder; ? bipolar  . Dermatomycosis, unspecified   . Diabetes mellitus type II   . Edema   . Foot fracture, right   . HLD (hyperlipidemia)   . HTN (hypertension)   . Hx pulmonary embolism    multiple  . Lumbar spinal stenosis   . Malignant neoplasm of corpus uteri, except isthmus ([place])   . Morbid obesity ([place])   . Other specified disease of hair and hair follicles   . Pain in joint, pelvic region and thigh   . Panic disorder without agoraphobia     Medications:  Medications Prior to Admission  Medication Sig Dispense Refill Last Dose  . acetaminophen (TYLENOL)  325 MG tablet Take 650 mg by mouth every 6 (six) hours as needed for mild pain or headache.   Past Week at Unknown time  . aspirin EC 81 MG EC tablet Take 1 tablet (81 mg total) by mouth daily. 30 tablet 0 10/20/2018 at Unknown time  . buPROPion (WELLBUTRIN XL) 150 MG 24 hr tablet Take 300 mg by mouth daily.    10/20/2018 at Unknown time  . clonazePAM (KLONOPIN) 1 MG tablet Take 1 mg by mouth 2 (two) times daily.  0 10/20/2018 at Unknown time  . diclofenac sodium (VOLTAREN) 1 % GEL Apply 2 g topically every 8 (eight) hours as needed (lower back pain).   3 Past Week at Unknown time  . docusate sodium (COLACE) 100 MG capsule Take 100 capsules by mouth daily as needed for mild constipation.    10/20/2018 at Unknown time  . ELIQUIS 2.5 MG TABS tablet Take 2.5 mg by mouth 2 (two) times daily.  1 10/20/2018 at 19:00  . ezetimibe-simvastatin (VYTORIN) 10-20 MG tablet Take 1 tablet by mouth at bedtime.    10/20/2018 at Unknown time  . fluticasone (FLONASE) 50 MCG/ACT nasal spray Place  2 sprays into both nostrils daily.   10/20/2018 at Unknown time  . levothyroxine (SYNTHROID, LEVOTHROID) 100 MCG tablet Take 1 tablet (100 mcg total) by mouth daily before breakfast. 30 tablet 0 10/20/2018 at Unknown time  . metFORMIN (GLUCOPHAGE) 500 MG tablet Take 500 mg by mouth daily.  3 10/20/2018 at Unknown time  . Multiple Vitamin (MULTIVITAMIN WITH MINERALS) TABS tablet Take 1 tablet by mouth daily.   10/20/2018 at Unknown time  . omeprazole (PRILOSEC) 20 MG capsule TAKE ONE CAPSULE BY MOUTH EVERY DAY *NEEDS OFFICE VISIT* (Patient taking differently: Take 20 mg by mouth daily. ) 30 capsule 0 10/20/2018 at Unknown time  . Venlafaxine HCl 225 MG TB24 Take 225 mg by mouth daily.    10/20/2018 at Unknown time  . furosemide (LASIX) 40 MG tablet Take 1 tablet (40 mg total) by mouth as directed. 1 tablet Twice a day for 4 days, from 07/12/2018 take one tablet daily. 60 tablet 0    Scheduled:  . aspirin  81 mg Oral Daily  .  Chlorhexidine Gluconate Cloth  6 each Topical Q0600  . clonazePAM  0.5 mg Oral BID  . ezetimibe-simvastatin  1 tablet Oral QHS  . fluticasone  2 spray Each Nare Daily  . levothyroxine  100 mcg Oral QAC breakfast  . multivitamin with minerals  1 tablet Oral Daily  . mupirocin ointment  1 application Nasal BID  . pantoprazole  40 mg Oral Daily  . sodium bicarbonate  1,300 mg Oral BID  . [START ON 10/25/2018] venlafaxine XR  150 mg Oral Daily    Assessment: 77 y.o female morbidly obese w/ PMH DVT/PE on Eliquis, presents to ED 12/28 with AMS,  hematuria and abdominal discomfort, h/o endometrial cancer.  On Eliquis PTA, last dose taken pta was on 12/27 at 19:00.   Found to be in acute renal failure. Pharmacy consulted to start IV heparin while eliquis on hold due to acute renal failure. APTT came back SUBtherapeutic at 62, on 1200 units/hr. No s/sx of bleeding. No infusion issues. Heparin level elevated due to effect of apixaban. Will use APTT to dose heparin until these correlate.    APTT is now therapeutic at 76  Goal of Therapy:  Heparin level 0.3-0.7 units/ml aPTT 66-102 seconds Monitor platelets by anticoagulation protocol: Yes   Plan:  Continue heparin drip to 1400 units/hr Daily aPTT, HL, CBC, monitor for s/sx of bleeding  Elenor Quinones, PharmD, BCPS, [facility] Clinical Pharmacist Phone number [phone] 10/24/2018 10:05 PM

## 2018-10-24 NOTE — Progress Notes (Signed)
Patient has pulled out her IV. Will continue to monitor, and family is at bedside.  Farley Ly RN

## 2018-10-24 NOTE — Progress Notes (Signed)
PROGRESS NOTE                                                                                                                                                                                                             Patient Demographics:    Theresa Blake, is a 77 y.o. female, DOB - Sep 22, 1941, MWN:[id number]  Admit date - 10/21/2018   Admitting Physician Courage Denton Brick, MD  Outpatient Primary MD for the patient is Tracie Harrier, MD  LOS - 3  Chief Complaint  Patient presents with  . Altered Mental Status       Brief Narrative  Theresa Blake  is a 77 yo morbidly obese female with past medical history of DVT/PE on Eliquis, hypertension, hyperlipidemia, diabetes mellitus,  CKD III (baseline Cr 1.5), who presents with altered mental status, hematuria and abdominal discomfort, endometrial cancer was brought in for decreased mental status in the hospital found to be in acute renal failure, hyperkalemic, bilateral hydronephrosis with ureteric obstruction.  Nephrology and urology were consulted and we were requested to admit.    Subjective:   Seen in bed no discomfort but appears confused, denies any headache chest or abdominal pain.  Able to answer basic questions and follow basic commands.   Assessment  & Plan :     1.  ARF, hyperkalemia due to bilateral hydronephrosis - this most likely is due to underlying progression of endometrial cancer which she has history of, she has been seen by urology and nephrology, she underwent cystoscopy with bilateral ureteric stent placement by Dr. Diona Fanti urology on 10/21/2018.   As discussed with nephrologist Dr. Royce Macadamia on 10/14/2018, renal function is gradually improving with good urine output, she has now evidence of mild fluid overload and some rails along with some edema on chest x-ray, IV fluids will be held by nephrology on 10/24/2018 and monitored.  Nephrology does not want any Lasix for now.  Likely can resume hydration once her  fluid status has improved in the next 1 to 2 days.  2.  History of endometrial cancer now possibility of pulmonary mets and bilateral hydronephrosis.  Once stable outpatient oncology follow-up, called son [name] with no response.  Would like to discuss future line of care and CODE STATUS.  3.  History of PE.  Was on Eliquis currently will place her on heparin drip due to renal function and monitor.  4.  Acute metabolic acidosis.  Due to #1 above.  Placed on oral bicarb.  Monitor closely.  5.  Metabolic encephalopathy.  Due to #1 above.  Improving gradually.  6.  Hypothyroidism.  On Synthroid continue.  7.  GERD.  PPI.  8.  Dyslipidemia.  On statin continue.  9.  Possible pneumonia.  On Rocephin and azithromycin, overall improving continue to monitor.  10. DM 2 - ISS, stop nighttime insulin due to early morning hypoglycemia.  CBG (last 3)  Recent Labs    10/23/18 1658 10/23/18 2145 10/24/18 0726  GLUCAP 90 97 94   Lab Results  Component Value Date   HGBA1C 6.3 (H) 07/03/2018      Family Communication  :   Both sons over the phone on 10/22/2018.  Code Status :  Full  Disposition Plan  :  TBD  Consults  :  Renal, Urology  Procedures  :    CT - 1. Moderate bilateral hydroureteronephrosis. No nephroureterolithiasis or other clear source of obstruction. 2. Multiple bibasilar pulmonary nodules, measuring up to 10 mm, new since 11/18/17. These are concerning for metastatic disease. 3. Limited assessment of the uterine parenchyma without IV contrast material. Generally unchanged appearance of anterior fundal region of hypoattenuation. Aortic Atherosclerosis  DVT Prophylaxis  :  Hep gtt  Lab Results  Component Value Date   PLT 341 10/24/2018    Diet :  Diet Order            Diet renal/carb modified with fluid restriction Diet-HS Snack? Nothing; Fluid restriction: Other (see comments); Room service appropriate? Yes; Fluid consistency: Thin  Diet effective now                Inpatient Medications  Scheduled Meds: . aspirin  81 mg Oral Daily  . Chlorhexidine Gluconate Cloth  6 each Topical Q0600  . clonazePAM  0.5 mg Oral BID  . ezetimibe-simvastatin  1 tablet Oral QHS  . fluticasone  2 spray Each Nare Daily  . levothyroxine  100 mcg Oral QAC breakfast  . multivitamin with minerals  1 tablet Oral Daily  . mupirocin ointment  1 application Nasal BID  . pantoprazole  40 mg Oral Daily  . sodium bicarbonate  1,300 mg Oral BID  . venlafaxine XR  225 mg Oral Daily   Continuous Infusions: . albuterol 10 mg/hr (10/21/18 0653)  . azithromycin 500 mg (10/24/18 0627)  . cefTRIAXone (ROCEPHIN)  IV 1 g (10/24/18 0512)  . heparin 1,400 Units/hr (10/24/18 0858)   PRN Meds:.acetaminophen **OR** [DISCONTINUED] acetaminophen, albuterol, [DISCONTINUED] ondansetron **OR** ondansetron (ZOFRAN) IV, polyethylene glycol, traZODone  Antibiotics  :   Anti-infectives (From admission, onward)   Start     Dose/Rate Route Frequency Ordered Stop   10/22/18 0600  cefTRIAXone (ROCEPHIN) 1 g in sodium chloride 0.9 % 100 mL IVPB     1 g 200 mL/hr over 30 Minutes Intravenous Every 24 hours 10/21/18 1438     10/22/18 0600  azithromycin (ZITHROMAX) 500 mg in sodium chloride 0.9 % 250 mL IVPB     500 mg 250 mL/hr over 60 Minutes Intravenous Every 24 hours 10/21/18 1438     10/21/18 0445  cefTRIAXone (ROCEPHIN) 1 g in sodium chloride 0.9 % 100 mL IVPB     1 g 200 mL/hr over 30 Minutes Intravenous  Once 10/21/18 0435 10/21/18 0540   10/21/18 0445  azithromycin (ZITHROMAX) 500 mg in sodium chloride 0.9 % 250 mL IVPB     500 mg 250 mL/hr over 60 Minutes Intravenous  Once 10/21/18 0435 10/21/18 6144  Objective:   Vitals:   10/23/18 1657 10/23/18 1959 10/24/18 0557 10/24/18 0726  BP: (!) 158/73 (!) 141/76 (!) 134/59 (!) 142/70  Pulse: (!) 103 99 (!) 108 100  Resp: (!) 22 20 19 18   Temp: (!) 97.4 F (36.3 C) (!) 97.3 F (36.3 C) 98.2 F (36.8 C)   TempSrc: Oral Oral  Oral   SpO2: 100% 98% 98% 97%  Weight:      Height:        Wt Readings from Last 3 Encounters:  10/23/18 118.2 kg  07/08/18 114.4 kg  11/30/17 (!) 142.4 kg     Intake/Output Summary (Last 24 hours) at 10/24/2018 1051 Last data filed at 10/24/2018 4818 Gross per 24 hour  Intake 2080.87 ml  Output 1800 ml  Net 280.87 ml     Physical Exam  Awake but confused has her extremity tremors right more than left, Foley catheter in place [place].AT,PERRAL Supple Neck,No JVD, No cervical lymphadenopathy appriciated.  Symmetrical Chest wall movement, Good air movement bilaterally, CTAB RRR,No Gallops, Rubs or new Murmurs, No Parasternal Heave +ve B.Sounds, Abd Soft, No tenderness, No organomegaly appriciated, No rebound - guarding or rigidity. No Cyanosis, Clubbing or edema, No new Rash or bruise    Data Review:    CBC Recent Labs  Lab 10/21/18 0334 10/22/18 0612 10/23/18 0622 10/24/18 0758  WBC 9.2 10.0 9.1 11.0*  HGB 10.7* 9.3* 9.6* 9.5*  HCT 34.0* 29.7* 30.6* 31.1*  PLT 375 302 337 341  MCV 91.9 92.2 91.6 90.9  MCH 28.9 28.9 28.7 27.8  MCHC 31.5 31.3 31.4 30.5  RDW 17.8* 18.2* 18.3* 18.5*  LYMPHSABS 1.4  --   --   --   MONOABS 1.0  --   --   --   EOSABS 0.5  --   --   --   BASOSABS 0.1  --   --   --     Chemistries  Recent Labs  Lab 10/21/18 0334 10/21/18 1023 10/21/18 1928 10/22/18 0612 10/23/18 0622 10/24/18 0758  NA 134* 136 134* 137 139 139  K 6.0* 5.0 5.1 5.4* 4.5 4.2  CL 102 104 105 108 110 111  CO2 15* 14* 14* 17* 16* 16*  GLUCOSE 75 144* 164* 94 94 100*  BUN 98* 93* 89* 87* 73* 63*  CREATININE 9.39* 9.02* 8.62* 8.20* 7.12* 6.12*  CALCIUM 10.0 9.8 9.4 9.1 9.1 9.1  AST 8*  --   --   --   --   --   ALT 5  --   --   --   --   --   ALKPHOS 77  --   --   --   --   --   BILITOT 0.5  --   --   --   --   --    ------------------------------------------------------------------------------------------------------------------ No results for input(s): CHOL,  HDL, LDLCALC, TRIG, CHOLHDL, LDLDIRECT in the last 72 hours.  Lab Results  Component Value Date   HGBA1C 6.3 (H) 07/03/2018   ------------------------------------------------------------------------------------------------------------------ No results for input(s): TSH, T4TOTAL, T3FREE, THYROIDAB in the last 72 hours.  Invalid input(s): FREET3 ------------------------------------------------------------------------------------------------------------------ No results for input(s): VITAMINB12, FOLATE, FERRITIN, TIBC, IRON, RETICCTPCT in the last 72 hours.  Coagulation profile No results for input(s): INR, PROTIME in the last 168 hours.  No results for input(s): DDIMER in the last 72 hours.  Cardiac Enzymes Recent Labs  Lab 10/21/18 0405  TROPONINI 0.10*   ------------------------------------------------------------------------------------------------------------------    Component Value Date/Time  BNP 1,250.6 (H) 10/21/2018 6759    Micro Results Recent Results (from the past 240 hour(s))  Blood Culture (routine x 2)     Status: None (Preliminary result)   Collection Time: 10/21/18  3:15 AM  Result Value Ref Range Status   Specimen Description BLOOD RIGHT ARM  Final   Special Requests   Final    BOTTLES DRAWN AEROBIC AND ANAEROBIC Blood Culture adequate volume   Culture  Setup Time NO ORGANISMS SEEN  Final   Culture   Final    NO GROWTH 3 DAYS Performed at [facility], [address] [place], [place] [address]    Report Status PENDING  Incomplete  Blood Culture (routine x 2)     Status: None (Preliminary result)   Collection Time: 10/21/18  3:30 AM  Result Value Ref Range Status   Specimen Description BLOOD RIGHT HAND  Final   Special Requests   Final    BOTTLES DRAWN AEROBIC ONLY Blood Culture adequate volume   Culture   Final    NO GROWTH 3 DAYS Performed at [facility], [place] [address] [place], [place] [address]    Report Status PENDING   Incomplete  Urine culture     Status: None   Collection Time: 10/21/18  5:51 AM  Result Value Ref Range Status   Specimen Description URINE, CATHETERIZED  Final   Special Requests NONE  Final   Culture   Final    NO GROWTH Performed at [facility], [address] [place], [place] [address]    Report Status 10/22/2018 FINAL  Final  MRSA PCR Screening     Status: Abnormal   Collection Time: 10/22/18  8:08 AM  Result Value Ref Range Status   MRSA by PCR POSITIVE (A) NEGATIVE Final    Comment:        The GeneXpert MRSA Assay (FDA approved for NASAL specimens only), is one component of a comprehensive MRSA colonization surveillance program. It is not intended to diagnose MRSA infection nor to guide or monitor treatment for MRSA infections. CRITICAL RESULT CALLED TO, READ BACK BY AND VERIFIED WITH: L. BENNET, RN AT 0945 ON 10/22/18 BY C. JESSUP, MLT. Performed at [facility], [place] [address] [place], [place] [address]     Radiology Reports Ct Abdomen Pelvis Wo Contrast  Result Date: 10/21/2018 CLINICAL DATA:  Abdominal pain and confusion. EXAM: CT ABDOMEN AND PELVIS WITHOUT CONTRAST TECHNIQUE: Multidetector CT imaging of the abdomen and pelvis was performed following the standard protocol without IV contrast. COMPARISON:  CT abdomen pelvis 11/18/2017 FINDINGS: LOWER CHEST: Multiple bibasilar pulmonary nodules, measuring up to 10 mm. HEPATOBILIARY: The hepatic contours and density are normal. There is no intra- or extrahepatic biliary dilatation. The gallbladder is normal. PANCREAS: The pancreatic parenchymal contours are normal and there is no ductal dilatation. There is no peripancreatic fluid collection. SPLEEN: Normal. ADRENALS/URINARY TRACT: --Adrenal glands: Normal. --Right kidney/ureter: There is moderate hydroureteronephrosis. No nephroureterolithiasis or other clear source of obstruction. --Left kidney/ureter: There is moderate hydroureteronephrosis. No  nephroureterolithiasis or other clear source of obstruction. --Urinary bladder: Urinary bladder is decompressed. STOMACH/BOWEL: --Stomach/Duodenum: There is no hiatal hernia or other gastric abnormality. The duodenal course and caliber are normal. --Small bowel: No dilatation or inflammation. --Colon: No focal abnormality. --Appendix: Not visualized. No right lower quadrant inflammation or free fluid. VASCULAR/LYMPHATIC: Atherosclerotic calcification is present within the non-aneurysmal abdominal aorta, without hemodynamically significant stenosis. No abdominal or pelvic lymphadenopathy. REPRODUCTIVE: There  is a T-shaped contraceptive device within the uterus. Area of hypoattenuation at the anterior uterine fundus appears unchanged, though poorly characterized without IV contrast MUSCULOSKELETAL. Multilevel degenerative disc disease and facet arthrosis. No bony spinal canal stenosis. OTHER: None. IMPRESSION: 1. Moderate bilateral hydroureteronephrosis. No nephroureterolithiasis or other clear source of obstruction. 2. Multiple bibasilar pulmonary nodules, measuring up to 10 mm, new since 11/18/17. These are concerning for metastatic disease. 3. Limited assessment of the uterine parenchyma without IV contrast material. Generally unchanged appearance of anterior fundal region of hypoattenuation. Aortic Atherosclerosis (ICD10-I70.0). Electronically Signed   By: Ulyses Jarred M.D.   On: 10/21/2018 06:02   Dg Chest 2 View  Result Date: 10/21/2018 CLINICAL DATA:  Acute onset of altered mental status. Confusion. EXAM: CHEST - 2 VIEW COMPARISON:  Chest radiograph performed 07/06/2018, and CTA of the chest performed 07/03/2018 FINDINGS: The lungs are well-aerated. Patchy bilateral airspace opacification is similar in appearance to prior studies and may reflect recurrent pulmonary edema or possibly pneumonia. A small left pleural effusion is again noted. No pneumothorax is seen. The cardiomediastinal silhouette is  borderline normal in size. There appears to be a mildly displaced fracture of the left lateral fifth rib. IMPRESSION: 1. Patchy bilateral airspace opacification is similar in appearance to prior studies and may reflect recurrent pulmonary edema or possibly pneumonia. Small left pleural effusion again noted. 2. Apparent mildly displaced fracture of the left lateral fifth rib. Electronically Signed   By: Garald Balding M.D.   On: 10/21/2018 04:24   Ct Head Wo Contrast  Result Date: 10/21/2018 CLINICAL DATA:  Acute onset of worsening confusion. Hematuria. EXAM: CT HEAD WITHOUT CONTRAST TECHNIQUE: Contiguous axial images were obtained from the base of the skull through the vertex without intravenous contrast. COMPARISON:  CT of the head performed 04/24/2018 FINDINGS: Brain: No evidence of acute infarction, hemorrhage, hydrocephalus, extra-axial collection or mass lesion / mass effect. Prominence of the ventricles and sulci reflects mild cortical volume loss. Diffuse periventricular and subcortical white matter change likely reflects small vessel ischemic microangiopathy. Chronic ischemic change is noted at the basal ganglia bilaterally. Mild cerebellar atrophy is noted. The brainstem and fourth ventricle are within normal limits. The basal ganglia are unremarkable in appearance. The cerebral hemispheres demonstrate grossly normal gray-white differentiation. No mass effect or midline shift is seen. Vascular: No hyperdense vessel or unexpected calcification. Skull: There is no evidence of fracture; visualized osseous structures are unremarkable in appearance. Sinuses/Orbits: The orbits are within normal limits. The paranasal sinuses and mastoid air cells are well-aerated. Other: No significant soft tissue abnormalities are seen. IMPRESSION: 1. No acute intracranial pathology seen on CT. 2. Mild cortical volume loss and diffuse small vessel ischemic microangiopathy. 3. Chronic ischemic change at the basal ganglia  bilaterally. Electronically Signed   By: Garald Balding M.D.   On: 10/21/2018 05:55   Dg Chest Port 1 View  Result Date: 10/23/2018 CLINICAL DATA:  Shortness of breath. EXAM: PORTABLE CHEST 1 VIEW COMPARISON:  Radiographs of October 21, 2018. FINDINGS: Stable cardiomediastinal silhouette. No pneumothorax or pleural effusion is noted. Right lung is clear. Patchy opacity is noted in left midlung laterally concerning for possible pneumonia. Bony thorax is unremarkable. IMPRESSION: Probable left midlung pneumonia. Followup PA and lateral chest X-ray is recommended in 3-4 weeks following trial of antibiotic therapy to ensure resolution and exclude underlying malignancy. Electronically Signed   By: Marijo Conception, M.D.   On: 10/23/2018 07:54   Dg C-arm 1-60 Min-no Report  Result Date: 10/21/2018  Fluoroscopy was utilized by the requesting physician.  No radiographic interpretation.    Time Spent in minutes  30   Lala Lund M.D on 10/24/2018 at 10:51 AM  To page go to [email or web address] - password [facility]

## 2018-10-24 NOTE — Progress Notes (Signed)
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 Anesthetics, Halogenated Shortness Of Breath    10/22/18 Spoke to patients son.  Shortness of breath occurred with Ether when patient was a child.    [place] [Levofloxacin In D5w] Itching  . Lamotrigine Itching  . Tramadol Rash    Patient Measurements: Height: 5\' 5"  (165.1 cm) Weight: 260 lb 9.3 oz (118.2 kg) IBW/kg (Calculated) : 57 Heparin Dosing Weight: 86 kg  Vital Signs: Temp: 98.2 F (36.8 C) (12/31 0557) Temp Source: Oral (12/31 0557) BP: 142/70 (12/31 0726) Pulse Rate: 100 (12/31 0726)  Labs: Recent Labs    10/21/18 1023 10/21/18 1928 10/22/18 0612  10/22/18 1225 10/22/18 2118 10/23/18 0622 10/24/18 0758  HGB  --   --  9.3*  --   --   --  9.6*  --   HCT  --   --  29.7*  --   --   --  30.6*  --   PLT  --   --  302  --   --   --  337  --   APTT  --   --   --    < > 40* 67* 80* 62*  HEPARINUNFRC  --   --   --   --  1.94*  --  1.18* 0.74*  CREATININE 9.02* 8.62* 8.20*  --   --   --  7.12*  --   CKTOTAL 18*  --   --   --   --   --   --   --    < > = values in this interval not displayed.    Estimated Creatinine Clearance: 8.6 mL/min (A) (by C-G formula based on SCr of 7.12 mg/dL (H)).   Medical History: Past Medical History:  Diagnosis Date  . Abnormal Pap smear of cervix   . Allergic rhinitis, cause unspecified   . Backache, unspecified   . Depression    mood disorder; ? bipolar  . Dermatomycosis, unspecified   . Diabetes mellitus type II   . Edema   . Foot fracture, right   . HLD (hyperlipidemia)   . HTN (hypertension)   . Hx pulmonary embolism    multiple  . Lumbar spinal stenosis   . Malignant neoplasm of corpus uteri, except isthmus ([place])   . Morbid obesity ([place])   . Other specified disease of hair and hair follicles   . Pain in joint, pelvic region and thigh   . Panic disorder  without agoraphobia     Medications:  Medications Prior to Admission  Medication Sig Dispense Refill Last Dose  . acetaminophen (TYLENOL) 325 MG tablet Take 650 mg by mouth every 6 (six) hours as needed for mild pain or headache.   Past Week at Unknown time  . aspirin EC 81 MG EC tablet Take 1 tablet (81 mg total) by mouth daily. 30 tablet 0 10/20/2018 at Unknown time  . buPROPion (WELLBUTRIN XL) 150 MG 24 hr tablet Take 300 mg by mouth daily.    10/20/2018 at Unknown time  . clonazePAM (KLONOPIN) 1 MG tablet Take 1 mg by mouth 2 (two) times daily.  0 10/20/2018 at Unknown time  . diclofenac sodium (VOLTAREN) 1 % GEL Apply 2 g topically every 8 (eight) hours as needed (lower back pain).   3 Past Week at Unknown time  . docusate sodium (COLACE) 100 MG capsule Take  100 capsules by mouth daily as needed for mild constipation.    10/20/2018 at Unknown time  . ELIQUIS 2.5 MG TABS tablet Take 2.5 mg by mouth 2 (two) times daily.  1 10/20/2018 at 19:00  . ezetimibe-simvastatin (VYTORIN) 10-20 MG tablet Take 1 tablet by mouth at bedtime.    10/20/2018 at Unknown time  . fluticasone (FLONASE) 50 MCG/ACT nasal spray Place 2 sprays into both nostrils daily.   10/20/2018 at Unknown time  . levothyroxine (SYNTHROID, LEVOTHROID) 100 MCG tablet Take 1 tablet (100 mcg total) by mouth daily before breakfast. 30 tablet 0 10/20/2018 at Unknown time  . metFORMIN (GLUCOPHAGE) 500 MG tablet Take 500 mg by mouth daily.  3 10/20/2018 at Unknown time  . Multiple Vitamin (MULTIVITAMIN WITH MINERALS) TABS tablet Take 1 tablet by mouth daily.   10/20/2018 at Unknown time  . omeprazole (PRILOSEC) 20 MG capsule TAKE ONE CAPSULE BY MOUTH EVERY DAY *NEEDS OFFICE VISIT* (Patient taking differently: Take 20 mg by mouth daily. ) 30 capsule 0 10/20/2018 at Unknown time  . Venlafaxine HCl 225 MG TB24 Take 225 mg by mouth daily.    10/20/2018 at Unknown time  . furosemide (LASIX) 40 MG tablet Take 1 tablet (40 mg total) by mouth as  directed. 1 tablet Twice a day for 4 days, from 07/12/2018 take one tablet daily. 60 tablet 0    Scheduled:  . aspirin  81 mg Oral Daily  . Chlorhexidine Gluconate Cloth  6 each Topical Q0600  . clonazePAM  0.5 mg Oral BID  . ezetimibe-simvastatin  1 tablet Oral QHS  . fluticasone  2 spray Each Nare Daily  . levothyroxine  100 mcg Oral QAC breakfast  . multivitamin with minerals  1 tablet Oral Daily  . mupirocin ointment  1 application Nasal BID  . pantoprazole  40 mg Oral Daily  . sodium bicarbonate  1,300 mg Oral BID  . venlafaxine XR  225 mg Oral Daily    Assessment: 77 y.o female morbidly obese w/ PMH DVT/PE on Eliquis, presents to ED 12/28 with AMS,  hematuria and abdominal discomfort, h/o endometrial cancer.  On Eliquis PTA, last dose taken pta was on 12/27 at 19:00.   Found to be in acute renal failure. Pharmacy consulted to start IV heparin while eliquis on hold due to acute renal failure. APTT came back SUBtherapeutic at 62, on 1200 units/hr. No s/sx of bleeding. No infusion issues. Heparin level elevated due to effect of apixaban. Will use APTT to dose heparin until these correlate.    Goal of Therapy:  Heparin level 0.3-0.7 units/ml aPTT 66-102 seconds Monitor platelets by anticoagulation protocol: Yes   Plan:  Monitor via aPTT due to PTA eliquis effect on heparin level Increase heparin drip to 1400 units/hr APTT in 8 hours Daily aPTT, HL, CBC, monitor for s/sx of bleeding  Keysi Oelkers A. Levada Dy, PharmD, [place] Pager: [phone] Please utilize [facility] for appropriate phone number to reach the unit pharmacist ([place])   10/24/2018,8:46 AM

## 2018-10-24 NOTE — Progress Notes (Signed)
[facility] Progress Note  Name: Theresa Blake MRN: [id number] DOB: Apr 26, 1941  Chief Complaint:  AMS, hematuria  Subjective:   Pt with 2.7 liters UOP over 12/30.  She had been on normal saline which was discontinued this AM with concern for developing overload.  Spoke with nursing - no issues with breathing today.  She has continued with confusion.  Review of systems:  Unable to reliably obtain 2/2 AMS, which is overall improving  She denies shortness of breath, CP or n/v Urinating via foley  --------------- Background on consult:  Theresa Blake is a 77 y.o. female with a history of HTN and DM who presented with confusion and hematuria.  She is altered and not able to provide any history.  I spoke with her son [name] (at [phone]) and he provided the history below.  He reports that she has been confused and "panicky" recently.  There has been blood in her urine and sediment for the past few weeks.  She has not been eating for the past 6 days but reports drinking some water.  Her son states that a week and a half ago she was "normal".  Per her son, she had uterine cancer and was treated 4-5 years ago at [facility] with radiation for the same.  Work-up is notable for UA with 6-10 RBC and 30 mg/dl protein.  CT a/p demonstrated moderate bilateral hydro without stone or clear source of obstruction.  CT also incidentally noted multiple bilateral pulmonary nodules which were characterized as concerning for metastatic disease.  Spoke with primary team and urology in ER and urology is planning for stents later today  Intake/Output Summary (Last 24 hours) at 10/24/2018 1600 Last data filed at 10/24/2018 1500 Gross per 24 hour  Intake 2672.87 ml  Output 1900 ml  Net 772.87 ml    Vitals:  Vitals:   10/23/18 1657 10/23/18 1959 10/24/18 0557 10/24/18 0726  BP: (!) 158/73 (!) 141/76 (!) 134/59 (!) 142/70  Pulse: (!) 103 99 (!) 108 100  Resp: (!) 22 20 19 18   Temp: (!) 97.4 F (36.3  C) (!) 97.3 F (36.3 C) 98.2 F (36.8 C)   TempSrc: Oral Oral Oral   SpO2: 100% 98% 98% 97%  Weight:      Height:         Physical Exam:   General elderly female in bed in no acute distress HEENT normocephalic atraumatic extraocular movements intact sclera anicteric Neck supple trachea midline Lungs clear anteriorly and unlabored at rest Heart regular rate and rhythm no rubs or gallops appreciated Abdomen soft nontender nondistended Extremities no pitting edema appreciated  Psych normal mood and affect GU - foley in place   Medications reviewed   Labs:  BMP Latest Ref Rng & Units 10/24/2018 10/23/2018 10/22/2018  Glucose 70 - 99 mg/dL 100(H) 94 94  BUN 8 - 23 mg/dL 63(H) 73(H) 87(H)  Creatinine 0.44 - 1.00 mg/dL 6.12(H) 7.12(H) 8.20(H)  Sodium 135 - 145 mmol/L 139 139 137  Potassium 3.5 - 5.1 mmol/L 4.2 4.5 5.4(H)  Chloride 98 - 111 mmol/L 111 110 108  CO2 22 - 32 mmol/L 16(L) 16(L) 17(L)  Calcium 8.9 - 10.3 mg/dL 9.1 9.1 9.1     Assessment/Plan:   # AKI  - Secondary to obstruction - s/p bilateral ureteral stents   - She is improving with supportive care  - Continue foley catheter per urology  - Fluids discontinued earlier today - will resume at reduced rate - tapering  effexor given renal function   # Obstructive uropathy - s/p stenting and with foley in place - Concern for possible recurrence of uterine cancer with metastatic disease  # CKD stage III  - Secondary to DM, HTN, as well as possible developing obstruction per last 06/2018 labs - Baseline Cr appears around 1.5 as above  # Metabolic acidosis  - Secondary to AKI as well as note metformin use in setting of AKI  - Continue oral sodium bicarbonate and supportive care for AKI  - lactic acid 2.9 on last check  # HTN  - Acceptable control  # AMS/encephalopathy - Secondary to acute illness - Supportive care - Family (son) is making decisions at this time  # Hyperkalemia  - Resolved s/p  kayexalate, lokelma, and sodium bicarb  # Pulmonary nodules  - Concerning for metastatic disease per CT  - Per primary team   # DM  - Would hold metformin with AKI.  Would not resume on discharge    Claudia Desanctis, MD 10/24/2018 4:00 PM

## 2018-10-25 LAB — CBC
HCT: 29.4 % — ABNORMAL LOW (ref 36.0–46.0)
Hemoglobin: 9.3 g/dL — ABNORMAL LOW (ref 12.0–15.0)
MCH: 28.5 pg (ref 26.0–34.0)
MCHC: 31.6 g/dL (ref 30.0–36.0)
MCV: 90.2 fL (ref 80.0–100.0)
Platelets: 371 10*3/uL (ref 150–400)
RBC: 3.26 MIL/uL — ABNORMAL LOW (ref 3.87–5.11)
RDW: 18.4 % — AB (ref 11.5–15.5)
WBC: 11.6 10*3/uL — ABNORMAL HIGH (ref 4.0–10.5)
nRBC: 0 % (ref 0.0–0.2)

## 2018-10-25 LAB — BASIC METABOLIC PANEL
Anion gap: 11 (ref 5–15)
BUN: 61 mg/dL — ABNORMAL HIGH (ref 8–23)
CALCIUM: 9.4 mg/dL (ref 8.9–10.3)
CO2: 19 mmol/L — ABNORMAL LOW (ref 22–32)
Chloride: 110 mmol/L (ref 98–111)
Creatinine, Ser: 5.71 mg/dL — ABNORMAL HIGH (ref 0.44–1.00)
GFR calc non Af Amer: 7 mL/min — ABNORMAL LOW (ref >60)
GFR, EST AFRICAN AMERICAN: 8 mL/min — AB (ref >60)
Glucose, Bld: 102 mg/dL — ABNORMAL HIGH (ref 70–99)
Potassium: 3.9 mmol/L (ref 3.5–5.1)
Sodium: 140 mmol/L (ref 135–145)

## 2018-10-25 LAB — GLUCOSE, CAPILLARY
Glucose-Capillary: 91 mg/dL (ref 70–99)
Glucose-Capillary: 101 mg/dL — ABNORMAL HIGH (ref 70–99)
Glucose-Capillary: 96 mg/dL (ref 70–99)
Glucose-Capillary: 104 mg/dL — ABNORMAL HIGH (ref 70–99)

## 2018-10-25 LAB — APTT
aPTT: 57 seconds — ABNORMAL HIGH (ref 24–36)
aPTT: 136 seconds — ABNORMAL HIGH (ref 24–36)

## 2018-10-25 LAB — HEPARIN LEVEL (UNFRACTIONATED): Heparin Unfractionated: 0.32 IU/mL (ref 0.30–0.70)

## 2018-10-25 MED ORDER — METOPROLOL TARTRATE 50 MG PO TABS
50.0000 mg | ORAL_TABLET | Freq: Two times a day (BID) | ORAL | Status: DC
  Administered 2018-10-25 – 2018-10-31 (×13): 50 mg via ORAL
  Filled 2018-10-25 (×13): qty 1

## 2018-10-25 NOTE — Progress Notes (Signed)
PROGRESS NOTE                                                                                                                                                                                                             Patient Demographics:    Theresa Blake, is a 78 y.o. female, DOB - 04/27/1941, VHQ:[id number]  Admit date - 10/21/2018   Admitting Physician Courage Denton Brick, MD  Outpatient Primary MD for the patient is Tracie Harrier, MD  LOS - 4  Chief Complaint  Patient presents with  . Altered Mental Status       Brief Narrative  Theresa Blake  is a 78 yo morbidly obese female with past medical history of DVT/PE on Eliquis, hypertension, hyperlipidemia, diabetes mellitus,  CKD III (baseline Cr 1.5), who presents with altered mental status, hematuria and abdominal discomfort, endometrial cancer was brought in for decreased mental status in the hospital found to be in acute renal failure, hyperkalemic, bilateral hydronephrosis with ureteric obstruction.  Nephrology and urology were consulted and we were requested to admit.    Subjective:   Patient in bed, appears comfortable, mildly confused, denies any headache, no fever, no chest pain or pressure, no shortness of breath , no abdominal pain. No focal weakness.    Assessment  & Plan :     1.  ARF, hyperkalemia due to bilateral hydronephrosis - this most likely is due to underlying progression of endometrial cancer which she has history of, she has been seen by urology and nephrology, she underwent cystoscopy with bilateral ureteric stent placement by Dr. Diona Fanti urology on 10/21/2018.   As discussed with nephrologist Dr. Royce Macadamia on 10/14/2018, renal function is gradually improving with good urine output, function gradually improving, fluid management by nephrology, continue to monitor BMP, advance activity and PT eval, if renal function much improved likely discharge to SNF in 1 to 2 days with outpatient urology and  nephrology follow-up.  2.  History of endometrial cancer now possibility of pulmonary mets and bilateral hydronephrosis.  Once stable outpatient allergy and oncology follow-up, called son [name] with no response.  Discussed with her sons outpatient urology and if needed oncology follow-up, for now they want to continue aggressive care and full code.  3.  History of PE.  Was on Eliquis currently will place her on heparin drip due to renal function and monitor.  4.  Acute metabolic acidosis.  Due to #1 above.  Placed on oral bicarb.  Monitor closely.  5.  Metabolic  encephalopathy.  Due to #1 above.  Improving gradually.  6.  Hypothyroidism.  On Synthroid continue.  7.  GERD.  PPI.  8.  Dyslipidemia.  On statin continue.  9.  Possible pneumonia.  On Rocephin and azithromycin, overall improving continue to monitor.  Stop date for antibiotics 10/27/2018.  10. DM 2 - ISS, stop nighttime insulin due to early morning hypoglycemia.  CBG (last 3)  Recent Labs    10/24/18 1622 10/24/18 2207 10/25/18 0729  GLUCAP 109* 102* 91   Lab Results  Component Value Date   HGBA1C 6.3 (H) 07/03/2018      Family Communication  :   Both sons over the phone on 10/22/2018.  Code Status :  Full  Disposition Plan  : Transfer to medical bed, SNF upon discharge, PT eval, social work consulted  Consults  :  Renal, Urology  Procedures  :    CT - 1. Moderate bilateral hydroureteronephrosis. No nephroureterolithiasis or other clear source of obstruction. 2. Multiple bibasilar pulmonary nodules, measuring up to 10 mm, new since 11/18/17. These are concerning for metastatic disease. 3. Limited assessment of the uterine parenchyma without IV contrast material. Generally unchanged appearance of anterior fundal region of hypoattenuation. Aortic Atherosclerosis  DVT Prophylaxis  :  Hep gtt  Lab Results  Component Value Date   PLT 371 10/25/2018    Diet :  Diet Order            Diet renal/carb modified  with fluid restriction Diet-HS Snack? Nothing; Fluid restriction: Other (see comments); Room service appropriate? Yes; Fluid consistency: Thin  Diet effective now               Inpatient Medications  Scheduled Meds: . aspirin  81 mg Oral Daily  . Chlorhexidine Gluconate Cloth  6 each Topical Q0600  . clonazePAM  0.5 mg Oral BID  . ezetimibe-simvastatin  1 tablet Oral QHS  . fluticasone  2 spray Each Nare Daily  . levothyroxine  100 mcg Oral QAC breakfast  . metoprolol tartrate  50 mg Oral BID  . multivitamin with minerals  1 tablet Oral Daily  . mupirocin ointment  1 application Nasal BID  . pantoprazole  40 mg Oral Daily  . sodium bicarbonate  1,300 mg Oral BID  . venlafaxine XR  150 mg Oral Daily   Continuous Infusions: . sodium chloride 50 mL/hr at 10/25/18 0531  . albuterol 10 mg/hr (10/21/18 0653)  . azithromycin 500 mg (10/25/18 0636)  . cefTRIAXone (ROCEPHIN)  IV 1 g (10/25/18 0537)  . heparin 1,400 Units/hr (10/25/18 0533)   PRN Meds:.acetaminophen **OR** [DISCONTINUED] acetaminophen, albuterol, [DISCONTINUED] ondansetron **OR** ondansetron (ZOFRAN) IV, polyethylene glycol, traZODone  Antibiotics  :   Anti-infectives (From admission, onward)   Start     Dose/Rate Route Frequency Ordered Stop   10/22/18 0600  cefTRIAXone (ROCEPHIN) 1 g in sodium chloride 0.9 % 100 mL IVPB     1 g 200 mL/hr over 30 Minutes Intravenous Every 24 hours 10/21/18 1438     10/22/18 0600  azithromycin (ZITHROMAX) 500 mg in sodium chloride 0.9 % 250 mL IVPB     500 mg 250 mL/hr over 60 Minutes Intravenous Every 24 hours 10/21/18 1438     10/21/18 0445  cefTRIAXone (ROCEPHIN) 1 g in sodium chloride 0.9 % 100 mL IVPB     1 g 200 mL/hr over 30 Minutes Intravenous  Once 10/21/18 0435 10/21/18 0540   10/21/18 0445  azithromycin (ZITHROMAX) 500 mg in sodium  chloride 0.9 % 250 mL IVPB     500 mg 250 mL/hr over 60 Minutes Intravenous  Once 10/21/18 0435 10/21/18 0607        Objective:    Vitals:   10/24/18 2205 10/25/18 0328 10/25/18 0355 10/25/18 0943  BP: (!) 155/82 (!) 151/79  (!) 150/93  Pulse: (!) 108 (!) 108  (!) 106  Resp: 19 19  18   Temp: 98.1 F (36.7 C) 98.2 F (36.8 C)  97.8 F (36.6 C)  TempSrc: Oral Oral  Oral  SpO2: 97% 92%  97%  Weight: 121 kg  121 kg   Height:        Wt Readings from Last 3 Encounters:  10/25/18 121 kg  07/08/18 114.4 kg  11/30/17 (!) 142.4 kg     Intake/Output Summary (Last 24 hours) at 10/25/2018 1107 Last data filed at 10/25/2018 5916 Gross per 24 hour  Intake 1761.87 ml  Output 1350 ml  Net 411.87 ml     Physical Exam  Awake but mildly confused has mild upper extremity tremors right more than left, Foley catheter in place [place].AT,PERRAL Supple Neck,No JVD, No cervical lymphadenopathy appriciated.  Symmetrical Chest wall movement, Good air movement bilaterally, few rales RRR,No Gallops, Rubs or new Murmurs, No Parasternal Heave +ve B.Sounds, Abd Soft, No tenderness, No organomegaly appriciated, No rebound - guarding or rigidity. No Cyanosis, Clubbing, trace edema, No new Rash or bruise    Data Review:    CBC Recent Labs  Lab 10/21/18 0334 10/22/18 0612 10/23/18 0622 10/24/18 0758 10/25/18 0541  WBC 9.2 10.0 9.1 11.0* 11.6*  HGB 10.7* 9.3* 9.6* 9.5* 9.3*  HCT 34.0* 29.7* 30.6* 31.1* 29.4*  PLT 375 302 337 341 371  MCV 91.9 92.2 91.6 90.9 90.2  MCH 28.9 28.9 28.7 27.8 28.5  MCHC 31.5 31.3 31.4 30.5 31.6  RDW 17.8* 18.2* 18.3* 18.5* 18.4*  LYMPHSABS 1.4  --   --   --   --   MONOABS 1.0  --   --   --   --   EOSABS 0.5  --   --   --   --   BASOSABS 0.1  --   --   --   --     Chemistries  Recent Labs  Lab 10/21/18 0334  10/21/18 1928 10/22/18 0612 10/23/18 0622 10/24/18 0758 10/25/18 0541  NA 134*   < > 134* 137 139 139 140  K 6.0*   < > 5.1 5.4* 4.5 4.2 3.9  CL 102   < > 105 108 110 111 110  CO2 15*   < > 14* 17* 16* 16* 19*  GLUCOSE 75   < > 164* 94 94 100* 102*  BUN 98*   < > 89* 87* 73*  63* 61*  CREATININE 9.39*   < > 8.62* 8.20* 7.12* 6.12* 5.71*  CALCIUM 10.0   < > 9.4 9.1 9.1 9.1 9.4  AST 8*  --   --   --   --   --   --   ALT 5  --   --   --   --   --   --   ALKPHOS 77  --   --   --   --   --   --   BILITOT 0.5  --   --   --   --   --   --    < > = values in this interval not displayed.   ------------------------------------------------------------------------------------------------------------------  No results for input(s): CHOL, HDL, LDLCALC, TRIG, CHOLHDL, LDLDIRECT in the last 72 hours.  Lab Results  Component Value Date   HGBA1C 6.3 (H) 07/03/2018   ------------------------------------------------------------------------------------------------------------------ No results for input(s): TSH, T4TOTAL, T3FREE, THYROIDAB in the last 72 hours.  Invalid input(s): FREET3 ------------------------------------------------------------------------------------------------------------------ No results for input(s): VITAMINB12, FOLATE, FERRITIN, TIBC, IRON, RETICCTPCT in the last 72 hours.  Coagulation profile No results for input(s): INR, PROTIME in the last 168 hours.  No results for input(s): DDIMER in the last 72 hours.  Cardiac Enzymes Recent Labs  Lab 10/21/18 0405  TROPONINI 0.10*   ------------------------------------------------------------------------------------------------------------------    Component Value Date/Time   BNP 1,250.6 (H) 10/21/2018 0102    Micro Results Recent Results (from the past 240 hour(s))  Blood Culture (routine x 2)     Status: None (Preliminary result)   Collection Time: 10/21/18  3:15 AM  Result Value Ref Range Status   Specimen Description BLOOD RIGHT ARM  Final   Special Requests   Final    BOTTLES DRAWN AEROBIC AND ANAEROBIC Blood Culture adequate volume   Culture  Setup Time NO ORGANISMS SEEN  Final   Culture   Final    NO GROWTH 3 DAYS Performed at [facility], [address], [place], [place]  [address]    Report Status PENDING  Incomplete  Blood Culture (routine x 2)     Status: None (Preliminary result)   Collection Time: 10/21/18  3:30 AM  Result Value Ref Range Status   Specimen Description BLOOD RIGHT HAND  Final   Special Requests   Final    BOTTLES DRAWN AEROBIC ONLY Blood Culture adequate volume   Culture   Final    NO GROWTH 3 DAYS Performed at [facility], [place] [address], [place], [place] [address]    Report Status PENDING  Incomplete  Urine culture     Status: None   Collection Time: 10/21/18  5:51 AM  Result Value Ref Range Status   Specimen Description URINE, CATHETERIZED  Final   Special Requests NONE  Final   Culture   Final    NO GROWTH Performed at [facility], [address] [place], [place] [address]    Report Status 10/22/2018 FINAL  Final  MRSA PCR Screening     Status: Abnormal   Collection Time: 10/22/18  8:08 AM  Result Value Ref Range Status   MRSA by PCR POSITIVE (A) NEGATIVE Final    Comment:        The GeneXpert MRSA Assay (FDA approved for NASAL specimens only), is one component of a comprehensive MRSA colonization surveillance program. It is not intended to diagnose MRSA infection nor to guide or monitor treatment for MRSA infections. CRITICAL RESULT CALLED TO, READ BACK BY AND VERIFIED WITH: L. BENNET, RN AT 0945 ON 10/22/18 BY C. JESSUP, MLT. Performed at [facility], [place] [address] [place], [place] [address]     Radiology Reports Ct Abdomen Pelvis Wo Contrast  Result Date: 10/21/2018 CLINICAL DATA:  Abdominal pain and confusion. EXAM: CT ABDOMEN AND PELVIS WITHOUT CONTRAST TECHNIQUE: Multidetector CT imaging of the abdomen and pelvis was performed following the standard protocol without IV contrast. COMPARISON:  CT abdomen pelvis 11/18/2017 FINDINGS: LOWER CHEST: Multiple bibasilar pulmonary nodules, measuring up to 10 mm. HEPATOBILIARY: The hepatic contours and density are normal. There is no intra- or  extrahepatic biliary dilatation. The gallbladder is normal. PANCREAS: The pancreatic parenchymal contours are normal and  there is no ductal dilatation. There is no peripancreatic fluid collection. SPLEEN: Normal. ADRENALS/URINARY TRACT: --Adrenal glands: Normal. --Right kidney/ureter: There is moderate hydroureteronephrosis. No nephroureterolithiasis or other clear source of obstruction. --Left kidney/ureter: There is moderate hydroureteronephrosis. No nephroureterolithiasis or other clear source of obstruction. --Urinary bladder: Urinary bladder is decompressed. STOMACH/BOWEL: --Stomach/Duodenum: There is no hiatal hernia or other gastric abnormality. The duodenal course and caliber are normal. --Small bowel: No dilatation or inflammation. --Colon: No focal abnormality. --Appendix: Not visualized. No right lower quadrant inflammation or free fluid. VASCULAR/LYMPHATIC: Atherosclerotic calcification is present within the non-aneurysmal abdominal aorta, without hemodynamically significant stenosis. No abdominal or pelvic lymphadenopathy. REPRODUCTIVE: There is a T-shaped contraceptive device within the uterus. Area of hypoattenuation at the anterior uterine fundus appears unchanged, though poorly characterized without IV contrast MUSCULOSKELETAL. Multilevel degenerative disc disease and facet arthrosis. No bony spinal canal stenosis. OTHER: None. IMPRESSION: 1. Moderate bilateral hydroureteronephrosis. No nephroureterolithiasis or other clear source of obstruction. 2. Multiple bibasilar pulmonary nodules, measuring up to 10 mm, new since 11/18/17. These are concerning for metastatic disease. 3. Limited assessment of the uterine parenchyma without IV contrast material. Generally unchanged appearance of anterior fundal region of hypoattenuation. Aortic Atherosclerosis (ICD10-I70.0). Electronically Signed   By: Ulyses Jarred M.D.   On: 10/21/2018 06:02   Dg Chest 2 View  Result Date: 10/21/2018 CLINICAL DATA:  Acute  onset of altered mental status. Confusion. EXAM: CHEST - 2 VIEW COMPARISON:  Chest radiograph performed 07/06/2018, and CTA of the chest performed 07/03/2018 FINDINGS: The lungs are well-aerated. Patchy bilateral airspace opacification is similar in appearance to prior studies and may reflect recurrent pulmonary edema or possibly pneumonia. A small left pleural effusion is again noted. No pneumothorax is seen. The cardiomediastinal silhouette is borderline normal in size. There appears to be a mildly displaced fracture of the left lateral fifth rib. IMPRESSION: 1. Patchy bilateral airspace opacification is similar in appearance to prior studies and may reflect recurrent pulmonary edema or possibly pneumonia. Small left pleural effusion again noted. 2. Apparent mildly displaced fracture of the left lateral fifth rib. Electronically Signed   By: Garald Balding M.D.   On: 10/21/2018 04:24   Ct Head Wo Contrast  Result Date: 10/21/2018 CLINICAL DATA:  Acute onset of worsening confusion. Hematuria. EXAM: CT HEAD WITHOUT CONTRAST TECHNIQUE: Contiguous axial images were obtained from the base of the skull through the vertex without intravenous contrast. COMPARISON:  CT of the head performed 04/24/2018 FINDINGS: Brain: No evidence of acute infarction, hemorrhage, hydrocephalus, extra-axial collection or mass lesion / mass effect. Prominence of the ventricles and sulci reflects mild cortical volume loss. Diffuse periventricular and subcortical white matter change likely reflects small vessel ischemic microangiopathy. Chronic ischemic change is noted at the basal ganglia bilaterally. Mild cerebellar atrophy is noted. The brainstem and fourth ventricle are within normal limits. The basal ganglia are unremarkable in appearance. The cerebral hemispheres demonstrate grossly normal gray-white differentiation. No mass effect or midline shift is seen. Vascular: No hyperdense vessel or unexpected calcification. Skull: There is  no evidence of fracture; visualized osseous structures are unremarkable in appearance. Sinuses/Orbits: The orbits are within normal limits. The paranasal sinuses and mastoid air cells are well-aerated. Other: No significant soft tissue abnormalities are seen. IMPRESSION: 1. No acute intracranial pathology seen on CT. 2. Mild cortical volume loss and diffuse small vessel ischemic microangiopathy. 3. Chronic ischemic change at the basal ganglia bilaterally. Electronically Signed   By: Garald Balding M.D.   On: 10/21/2018 05:55  Dg Chest Port 1 View  Result Date: 10/23/2018 CLINICAL DATA:  Shortness of breath. EXAM: PORTABLE CHEST 1 VIEW COMPARISON:  Radiographs of October 21, 2018. FINDINGS: Stable cardiomediastinal silhouette. No pneumothorax or pleural effusion is noted. Right lung is clear. Patchy opacity is noted in left midlung laterally concerning for possible pneumonia. Bony thorax is unremarkable. IMPRESSION: Probable left midlung pneumonia. Followup PA and lateral chest X-ray is recommended in 3-4 weeks following trial of antibiotic therapy to ensure resolution and exclude underlying malignancy. Electronically Signed   By: Marijo Conception, M.D.   On: 10/23/2018 07:54   Dg C-arm 1-60 Min-no Report  Result Date: 10/21/2018 Fluoroscopy was utilized by the requesting physician.  No radiographic interpretation.    Time Spent in minutes  30   Lala Lund M.D on 10/25/2018 at 11:07 AM  To page go to [email or web address] - password [facility]

## 2018-10-25 NOTE — Progress Notes (Signed)
[facility] Progress Note  Name: Theresa Blake MRN: [id number] DOB: 19-Apr-1941  Chief Complaint:  AMS, hematuria  Subjective:   Pt with 0.9 liters UOP over 12/31, was 2.7 12/30 and 3.4 on 12/29.   She received NS 50/hr yesterday.   She tells me she just wants to go home No family is in the room at this time  Review of systems:  Unable to reliably obtain 2/2 AMS, which is overall improving  She denies shortness of breath, CP or n/v Urinating via foley  --------------- Background on consult:  Theresa Blake is a 78 y.o. female with a history of HTN and DM who presented with confusion and hematuria.  She is altered and not able to provide any history.  I spoke with her son Theresa Blake (at [phone]) and he provided the history below.  He reports that she has been confused and "panicky" recently.  There has been blood in her urine and sediment for the past few weeks.  She has not been eating for the past 6 days but reports drinking some water.  Her son states that a week and a half ago she was "normal".  Per her son, she had uterine cancer and was treated 4-5 years ago at [facility] with radiation for the same.  Work-up is notable for UA with 6-10 RBC and 30 mg/dl protein.  CT a/p demonstrated moderate bilateral hydro without stone or clear source of obstruction.  CT also incidentally noted multiple bilateral pulmonary nodules which were characterized as concerning for metastatic disease.  Spoke with primary team and urology in ER and urology is planning for stents later today  Intake/Output Summary (Last 24 hours) at 10/25/2018 1226 Last data filed at 10/25/2018 0852 Gross per 24 hour  Intake 1761.87 ml  Output 1350 ml  Net 411.87 ml    Vitals:  Vitals:   10/24/18 2205 10/25/18 0328 10/25/18 0355 10/25/18 0943  BP: (!) 155/82 (!) 151/79  (!) 150/93  Pulse: (!) 108 (!) 108  (!) 106  Resp: 19 19  18   Temp: 98.1 F (36.7 C) 98.2 F (36.8 C)  97.8 F (36.6 C)  TempSrc: Oral Oral   Oral  SpO2: 97% 92%  97%  Weight: 121 kg  121 kg   Height:         Physical Exam:   General elderly female in bed in no acute distress HEENT normocephalic atraumatic extraocular movements intact sclera anicteric Neck supple trachea midline Lungs clear anteriorly and unlabored at rest Heart regular rate and rhythm no rubs or gallops appreciated Abdomen soft nontender nondistended Extremities no pitting edema appreciated  Psych normal mood and affect GU - foley in place   Medications reviewed   Labs:  BMP Latest Ref Rng & Units 10/25/2018 10/24/2018 10/23/2018  Glucose 70 - 99 mg/dL 102(H) 100(H) 94  BUN 8 - 23 mg/dL 61(H) 63(H) 73(H)  Creatinine 0.44 - 1.00 mg/dL 5.71(H) 6.12(H) 7.12(H)  Sodium 135 - 145 mmol/L 140 139 139  Potassium 3.5 - 5.1 mmol/L 3.9 4.2 4.5  Chloride 98 - 111 mmol/L 110 111 110  CO2 22 - 32 mmol/L 19(L) 16(L) 16(L)  Calcium 8.9 - 10.3 mg/dL 9.4 9.1 9.1     Assessment/Plan:   # AKI on CKD 3 secondary to DM, HTN with BL ~1.5 - AKI secondary to obstruction from suspected recurrent endometrial Ca now s/p bilateral ureteral stents  - She is improving with supportive care with creatinine down from peak 9/4 on  12/28 to 5.7 today  - UOP is a bit lower than days prior but I wonder if retained solute has now been excreted so UOP has dropped; it's still adequate.   - Hold on fluids or diuretics today. - Continue foley catheter per urology  - tapering effexor given renal function   # Obstructive uropathy - s/p stenting and with foley in place - Concern for possible recurrence of uterine cancer with metastatic disease  # Metabolic acidosis  - Secondary to AKI as well as note metformin use in setting of AKI  - Continue oral sodium bicarbonate and supportive care for AKI  - serum bicarb today 19 on 1300 BID, CTM  # HTN  - Marginal control on metoprolol 50 BID - If remains consistently elevated may consider augmenting therapy  # AMS/encephalopathy -  Secondary to acute illness - Supportive care - Family (son) is making decisions at this time  # Hyperkalemia  - Resolved s/p kayexalate, lokelma, and sodium bicarb  # Pulmonary nodules, possible PNA - on rocephic and azithromycin per primary  - Concerning for metastatic disease per CT  - Per primary team   # DM  - Would hold metformin with AKI.  Would not resume on discharge  #h/o PE: was on eliquis but has been switched to heparin gtt in light of acute issues.     Justin Mend, MD 10/25/2018 12:26 PM

## 2018-10-25 NOTE — Progress Notes (Signed)
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 Anesthetics, Halogenated Shortness Of Breath    10/22/18 Spoke to patients son.  Shortness of breath occurred with Ether when patient was a child.    Mack Hook [Levofloxacin In D5w] Itching  . Lamotrigine Itching  . Tramadol Rash    Patient Measurements: Height: 5\' 5"  (165.1 cm) Weight: 266 lb 12.1 oz (121 kg) IBW/kg (Calculated) : 57 Heparin Dosing Weight: 86 kg  Vital Signs: Temp: 98.2 F (36.8 C) (01/01 0328) Temp Source: Oral (01/01 0328) BP: 151/79 (01/01 0328) Pulse Rate: 108 (01/01 0328)  Labs: Recent Labs    10/23/18 0622 10/24/18 0758 10/24/18 2041 10/25/18 0541  HGB 9.6* 9.5*  --  9.3*  HCT 30.6* 31.1*  --  29.4*  PLT 337 341  --  371  APTT 80* 62* 76* 57*  HEPARINUNFRC 1.18* 0.74*  --  0.32  CREATININE 7.12* 6.12*  --  5.71*    Estimated Creatinine Clearance: 10.9 mL/min (A) (by C-G formula based on SCr of 5.71 mg/dL (H)).   Medical History: Past Medical History:  Diagnosis Date  . Abnormal Pap smear of cervix   . Allergic rhinitis, cause unspecified   . Backache, unspecified   . Depression    mood disorder; ? bipolar  . Dermatomycosis, unspecified   . Diabetes mellitus type II   . Edema   . Foot fracture, right   . HLD (hyperlipidemia)   . HTN (hypertension)   . Hx pulmonary embolism    multiple  . Lumbar spinal stenosis   . Malignant neoplasm of corpus uteri, except isthmus ([place])   . Morbid obesity ([place])   . Other specified disease of hair and hair follicles   . Pain in joint, pelvic region and thigh   . Panic disorder without agoraphobia     Medications:  Medications Prior to Admission  Medication Sig Dispense Refill Last Dose  . acetaminophen (TYLENOL) 325 MG tablet Take 650 mg by mouth every 6 (six) hours as needed for mild pain or headache.   Past Week at Unknown time  .  aspirin EC 81 MG EC tablet Take 1 tablet (81 mg total) by mouth daily. 30 tablet 0 10/20/2018 at Unknown time  . buPROPion (WELLBUTRIN XL) 150 MG 24 hr tablet Take 300 mg by mouth daily.    10/20/2018 at Unknown time  . clonazePAM (KLONOPIN) 1 MG tablet Take 1 mg by mouth 2 (two) times daily.  0 10/20/2018 at Unknown time  . diclofenac sodium (VOLTAREN) 1 % GEL Apply 2 g topically every 8 (eight) hours as needed (lower back pain).   3 Past Week at Unknown time  . docusate sodium (COLACE) 100 MG capsule Take 100 capsules by mouth daily as needed for mild constipation.    10/20/2018 at Unknown time  . ELIQUIS 2.5 MG TABS tablet Take 2.5 mg by mouth 2 (two) times daily.  1 10/20/2018 at 19:00  . ezetimibe-simvastatin (VYTORIN) 10-20 MG tablet Take 1 tablet by mouth at bedtime.    10/20/2018 at Unknown time  . fluticasone (FLONASE) 50 MCG/ACT nasal spray Place 2 sprays into both nostrils daily.   10/20/2018 at Unknown time  . levothyroxine (SYNTHROID, LEVOTHROID) 100 MCG tablet Take 1 tablet (100 mcg total) by mouth daily before breakfast. 30 tablet 0 10/20/2018 at Unknown time  . metFORMIN (GLUCOPHAGE) 500 MG tablet Take 500 mg by mouth daily.  3 10/20/2018 at Unknown time  . Multiple Vitamin (MULTIVITAMIN WITH MINERALS) TABS tablet Take 1 tablet by mouth daily.   10/20/2018 at Unknown time  . omeprazole (PRILOSEC) 20 MG capsule TAKE ONE CAPSULE BY MOUTH EVERY DAY *NEEDS OFFICE VISIT* (Patient taking differently: Take 20 mg by mouth daily. ) 30 capsule 0 10/20/2018 at Unknown time  . Venlafaxine HCl 225 MG TB24 Take 225 mg by mouth daily.    10/20/2018 at Unknown time  . furosemide (LASIX) 40 MG tablet Take 1 tablet (40 mg total) by mouth as directed. 1 tablet Twice a day for 4 days, from 07/12/2018 take one tablet daily. 60 tablet 0    Scheduled:  . aspirin  81 mg Oral Daily  . Chlorhexidine Gluconate Cloth  6 each Topical Q0600  . clonazePAM  0.5 mg Oral BID  . ezetimibe-simvastatin  1 tablet Oral  QHS  . fluticasone  2 spray Each Nare Daily  . levothyroxine  100 mcg Oral QAC breakfast  . multivitamin with minerals  1 tablet Oral Daily  . mupirocin ointment  1 application Nasal BID  . pantoprazole  40 mg Oral Daily  . sodium bicarbonate  1,300 mg Oral BID  . venlafaxine XR  150 mg Oral Daily    Assessment: 78 y.o female morbidly obese w/ PMH DVT/PE on Eliquis, presents to ED 12/28 with AMS,  hematuria and abdominal discomfort, h/o endometrial cancer.  On Eliquis PTA, last dose taken pta was on 12/27 at 19:00.   Found to be in acute renal failure. Pharmacy consulted to start IV heparin while eliquis on hold due to acute renal failure. APTT came back SUBtherapeutic at 62, on 1200 units/hr. No s/sx of bleeding. No infusion issues. Heparin level elevated due to effect of apixaban. Will use APTT to dose heparin until these correlate.    APTT is SUBtherapeutic this morning at 57. Spoke to RN and patient lost IV access for about an hour this morning around 0500.  Goal of Therapy:  Heparin level 0.3-0.7 units/ml aPTT 66-102 seconds Monitor platelets by anticoagulation protocol: Yes   Plan:  Continue heparin drip at 1400 units/hr Recheck level now that heparin is back on Daily aPTT, HL, CBC, monitor for s/sx of bleeding   Thank you for allowing [place] to participate in this patients care.   Jens Som, PharmD Please utilize Amion (under [place]) for appropriate number for your unit pharmacist. 10/25/2018 8:32 AM

## 2018-10-25 NOTE — Progress Notes (Signed)
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 Anesthetics, Halogenated Shortness Of Breath    10/22/18 Spoke to patients son.  Shortness of breath occurred with Ether when patient was a child.    Mack Hook [Levofloxacin In D5w] Itching  . Lamotrigine Itching  . Tramadol Rash    Patient Measurements: Height: 5\' 5"  (165.1 cm) Weight: 266 lb 12.1 oz (121 kg) IBW/kg (Calculated) : 57 Heparin Dosing Weight: 86 kg  Vital Signs: Temp: 97.6 F (36.4 C) (01/01 1818) Temp Source: Oral (01/01 1818) BP: 148/81 (01/01 1818) Pulse Rate: 86 (01/01 1818)  Labs: Recent Labs    10/23/18 0622 10/24/18 0758 10/24/18 2041 10/25/18 0541 10/25/18 1521  HGB 9.6* 9.5*  --  9.3*  --   HCT 30.6* 31.1*  --  29.4*  --   PLT 337 341  --  371  --   APTT 80* 62* 76* 57* 136*  HEPARINUNFRC 1.18* 0.74*  --  0.32  --   CREATININE 7.12* 6.12*  --  5.71*  --     Estimated Creatinine Clearance: 10.9 mL/min (A) (by C-G formula based on SCr of 5.71 mg/dL (H)).   Medical History: Past Medical History:  Diagnosis Date  . Abnormal Pap smear of cervix   . Allergic rhinitis, cause unspecified   . Backache, unspecified   . Depression    mood disorder; ? bipolar  . Dermatomycosis, unspecified   . Diabetes mellitus type II   . Edema   . Foot fracture, right   . HLD (hyperlipidemia)   . HTN (hypertension)   . Hx pulmonary embolism    multiple  . Lumbar spinal stenosis   . Malignant neoplasm of corpus uteri, except isthmus ([place])   . Morbid obesity ([place])   . Other specified disease of hair and hair follicles   . Pain in joint, pelvic region and thigh   . Panic disorder without agoraphobia     Medications:  Medications Prior to Admission  Medication Sig Dispense Refill Last Dose  . acetaminophen (TYLENOL) 325 MG tablet Take 650 mg by mouth every 6 (six) hours as needed for mild pain or  headache.   Past Week at Unknown time  . aspirin EC 81 MG EC tablet Take 1 tablet (81 mg total) by mouth daily. 30 tablet 0 10/20/2018 at Unknown time  . buPROPion (WELLBUTRIN XL) 150 MG 24 hr tablet Take 300 mg by mouth daily.    10/20/2018 at Unknown time  . clonazePAM (KLONOPIN) 1 MG tablet Take 1 mg by mouth 2 (two) times daily.  0 10/20/2018 at Unknown time  . diclofenac sodium (VOLTAREN) 1 % GEL Apply 2 g topically every 8 (eight) hours as needed (lower back pain).   3 Past Week at Unknown time  . docusate sodium (COLACE) 100 MG capsule Take 100 capsules by mouth daily as needed for mild constipation.    10/20/2018 at Unknown time  . ELIQUIS 2.5 MG TABS tablet Take 2.5 mg by mouth 2 (two) times daily.  1 10/20/2018 at 19:00  . ezetimibe-simvastatin (VYTORIN) 10-20 MG tablet Take 1 tablet by mouth at bedtime.    10/20/2018 at Unknown time  . fluticasone (FLONASE) 50 MCG/ACT nasal spray Place 2 sprays into both nostrils daily.   10/20/2018 at Unknown time  . levothyroxine (SYNTHROID, LEVOTHROID) 100 MCG tablet Take 1 tablet (100 mcg total) by mouth daily before breakfast. 30 tablet  0 10/20/2018 at Unknown time  . metFORMIN (GLUCOPHAGE) 500 MG tablet Take 500 mg by mouth daily.  3 10/20/2018 at Unknown time  . Multiple Vitamin (MULTIVITAMIN WITH MINERALS) TABS tablet Take 1 tablet by mouth daily.   10/20/2018 at Unknown time  . omeprazole (PRILOSEC) 20 MG capsule TAKE ONE CAPSULE BY MOUTH EVERY DAY *NEEDS OFFICE VISIT* (Patient taking differently: Take 20 mg by mouth daily. ) 30 capsule 0 10/20/2018 at Unknown time  . Venlafaxine HCl 225 MG TB24 Take 225 mg by mouth daily.    10/20/2018 at Unknown time  . furosemide (LASIX) 40 MG tablet Take 1 tablet (40 mg total) by mouth as directed. 1 tablet Twice a day for 4 days, from 07/12/2018 take one tablet daily. 60 tablet 0    Scheduled:  . aspirin  81 mg Oral Daily  . Chlorhexidine Gluconate Cloth  6 each Topical Q0600  . clonazePAM  0.5 mg Oral BID   . ezetimibe-simvastatin  1 tablet Oral QHS  . fluticasone  2 spray Each Nare Daily  . levothyroxine  100 mcg Oral QAC breakfast  . metoprolol tartrate  50 mg Oral BID  . multivitamin with minerals  1 tablet Oral Daily  . mupirocin ointment  1 application Nasal BID  . pantoprazole  40 mg Oral Daily  . sodium bicarbonate  1,300 mg Oral BID  . venlafaxine XR  150 mg Oral Daily    Assessment: 78 y.o female morbidly obese w/ PMH DVT/PE on Eliquis, presents to ED 12/28 with AMS,  hematuria and abdominal discomfort, h/o endometrial cancer.  On Eliquis PTA, last dose taken pta was on 12/27 at 19:00.   Found to be in acute renal failure. Pharmacy consulted to start IV heparin while eliquis on hold due to acute renal failure. APTT now SUPRAtherapeutic on 1400 units/hr.    No s/sx of bleeding. No infusion issues. Heparin level elevated due to effect of apixaban. Will use APTT to dose heparin until these correlate.    Goal of Therapy:  Heparin level 0.3-0.7 units/ml aPTT 66-102 seconds Monitor platelets by anticoagulation protocol: Yes   Plan:  Reduce heparin drip to 1250 units/hr Recheck level in 6 hours Daily aPTT, heparin level, CBC, monitor for s/sx of bleeding   Thank you for allowing [place] to participate in this patients care.   [employer], Pharm.D., BCPS Clinical Pharmacist  **Pharmacist phone directory can now be found on [email or web address] ([email or web address]).  Listed under [place].  10/25/2018 6:30 PM

## 2018-10-26 ENCOUNTER — Inpatient Hospital Stay (HOSPITAL_COMMUNITY): Payer: Medicare Other

## 2018-10-26 LAB — CULTURE, BLOOD (ROUTINE X 2)
Culture  Setup Time: NONE SEEN
Culture: NO GROWTH
Culture: NO GROWTH
SPECIAL REQUESTS: ADEQUATE
Special Requests: ADEQUATE

## 2018-10-26 LAB — CBC
HCT: 30.6 % — ABNORMAL LOW (ref 36.0–46.0)
Hemoglobin: 9.3 g/dL — ABNORMAL LOW (ref 12.0–15.0)
MCH: 27.4 pg (ref 26.0–34.0)
MCHC: 30.4 g/dL (ref 30.0–36.0)
MCV: 90.3 fL (ref 80.0–100.0)
Platelets: 362 10*3/uL (ref 150–400)
RBC: 3.39 MIL/uL — ABNORMAL LOW (ref 3.87–5.11)
RDW: 18.6 % — ABNORMAL HIGH (ref 11.5–15.5)
WBC: 12.9 10*3/uL — ABNORMAL HIGH (ref 4.0–10.5)
nRBC: 0 % (ref 0.0–0.2)

## 2018-10-26 LAB — GLUCOSE, CAPILLARY
GLUCOSE-CAPILLARY: 93 mg/dL (ref 70–99)
Glucose-Capillary: 96 mg/dL (ref 70–99)
Glucose-Capillary: 101 mg/dL — ABNORMAL HIGH (ref 70–99)
Glucose-Capillary: 98 mg/dL (ref 70–99)

## 2018-10-26 LAB — BASIC METABOLIC PANEL
Anion gap: 12 (ref 5–15)
BUN: 62 mg/dL — ABNORMAL HIGH (ref 8–23)
CO2: 17 mmol/L — ABNORMAL LOW (ref 22–32)
CREATININE: 5.78 mg/dL — AB (ref 0.44–1.00)
Calcium: 9.3 mg/dL (ref 8.9–10.3)
Chloride: 111 mmol/L (ref 98–111)
GFR calc Af Amer: 8 mL/min — ABNORMAL LOW (ref >60)
GFR calc non Af Amer: 7 mL/min — ABNORMAL LOW (ref >60)
Glucose, Bld: 101 mg/dL — ABNORMAL HIGH (ref 70–99)
Potassium: 4.1 mmol/L (ref 3.5–5.1)
Sodium: 140 mmol/L (ref 135–145)

## 2018-10-26 LAB — HEPARIN LEVEL (UNFRACTIONATED)
Heparin Unfractionated: 0.47 IU/mL (ref 0.30–0.70)
Heparin Unfractionated: 0.56 IU/mL (ref 0.30–0.70)

## 2018-10-26 LAB — MAGNESIUM: Magnesium: 1.2 mg/dL — ABNORMAL LOW (ref 1.7–2.4)

## 2018-10-26 LAB — APTT: aPTT: 93 seconds — ABNORMAL HIGH (ref 24–36)

## 2018-10-26 MED ORDER — MAGNESIUM SULFATE 2 GM/50ML IV SOLN
2.0000 g | Freq: Once | INTRAVENOUS | Status: AC
  Administered 2018-10-26: 2 g via INTRAVENOUS
  Filled 2018-10-26: qty 50

## 2018-10-26 MED ORDER — FUROSEMIDE 10 MG/ML IJ SOLN
40.0000 mg | Freq: Once | INTRAMUSCULAR | Status: AC
  Administered 2018-10-26: 40 mg via INTRAVENOUS
  Filled 2018-10-26: qty 4

## 2018-10-26 NOTE — Progress Notes (Signed)
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 Anesthetics, Halogenated Shortness Of Breath    10/22/18 Spoke to patients son.  Shortness of breath occurred with Ether when patient was a child.    Mack Hook [Levofloxacin In D5w] Itching  . Lamotrigine Itching  . Tramadol Rash   Patient Measurements: Height: 5\' 5"  (165.1 cm) Weight: 266 lb 12.1 oz (121 kg) IBW/kg (Calculated) : 57 Heparin Dosing Weight: 86 kg  Vital Signs: Temp: 97.8 F (36.6 C) (01/02 0455) Temp Source: Oral (01/02 0455) BP: 140/82 (01/02 0455) Pulse Rate: 80 (01/02 0455)  Labs: Recent Labs    10/24/18 0758  10/25/18 0541 10/25/18 1521 10/26/18 0233 10/26/18 0234 10/26/18 0252  HGB 9.5*  --  9.3*  --  9.3*  --   --   HCT 31.1*  --  29.4*  --  30.6*  --   --   PLT 341  --  371  --  362  --   --   APTT 62*   < > 57* 136*  --   --  93*  HEPARINUNFRC 0.74*  --  0.32  --   --  0.47  --   CREATININE 6.12*  --  5.71*  --  5.78*  --   --    < > = values in this interval not displayed.    Estimated Creatinine Clearance: 10.8 mL/min (A) (by C-G formula based on SCr of 5.78 mg/dL (H)).   Assessment: 78 y.o female morbidly obese w/ PMH DVT/PE on Eliquis, presents to ED 12/28 with AMS,  hematuria and abdominal discomfort, h/o endometrial cancer.  On Eliquis PTA, last dose taken pta was on 12/27 at 19:00.   Found to be in acute renal failure. Pharmacy consulted to start IV heparin while eliquis on hold due to acute renal failure. APTT came back    1/2 AM update: heparin level is therapeutic at 0.47 and aPTT is therapeutic at 93, will now use heparin levels only to dose   Goal of Therapy:  Heparin level 0.3-0.7 units/mL Monitor platelets by anticoagulation protocol: Yes   Plan:  Cont heparin at 1250 units/hr Confirmatory heparin level at [place], PharmD, [facility]  Pharmacist Phone: [phone]

## 2018-10-26 NOTE — Progress Notes (Signed)
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 Anesthetics, Halogenated Shortness Of Breath    10/22/18 Spoke to patients son.  Shortness of breath occurred with Ether when patient was a child.    Mack Hook [Levofloxacin In D5w] Itching  . Lamotrigine Itching  . Tramadol Rash   Patient Measurements: Height: 5\' 5"  (165.1 cm) Weight: 266 lb 12.1 oz (121 kg) IBW/kg (Calculated) : 57 Heparin Dosing Weight: 86 kg  Vital Signs: Temp: 98.1 F (36.7 C) (01/02 0916) Temp Source: Oral (01/02 0916) BP: 145/74 (01/02 0916) Pulse Rate: 79 (01/02 0916)  Labs: Recent Labs    10/24/18 0758  10/25/18 0541 10/25/18 1521 10/26/18 0233 10/26/18 0234 10/26/18 0252 10/26/18 1152  HGB 9.5*  --  9.3*  --  9.3*  --   --   --   HCT 31.1*  --  29.4*  --  30.6*  --   --   --   PLT 341  --  371  --  362  --   --   --   APTT 62*   < > 57* 136*  --   --  93*  --   HEPARINUNFRC 0.74*  --  0.32  --   --  0.47  --  0.56  CREATININE 6.12*  --  5.71*  --  5.78*  --   --   --    < > = values in this interval not displayed.    Estimated Creatinine Clearance: 10.8 mL/min (A) (by C-G formula based on SCr of 5.78 mg/dL (H)).   Assessment: 78 y.o female morbidly obese w/ PMH DVT/PE on Eliquis, presents to ED 12/28 with AMS,  hematuria and abdominal discomfort, h/o endometrial cancer.  On Eliquis PTA, last dose taken pta was on 12/27 at 19:00.   Found to be in acute renal failure. Pharmacy consulted to start IV heparin while eliquis on hold due to acute renal failure.   Heparin level 0.56, therapeutic   Goal of Therapy:  Heparin level 0.3-0.7 units/mL Monitor platelets by anticoagulation protocol: Yes   Plan:  Cont heparin at 1250 units/hr AM heparin level  Joeline Freer A. Levada Dy, PharmD, [place] Pager: [phone] Please utilize Amion for appropriate phone number  to reach the unit pharmacist ([place])

## 2018-10-26 NOTE — Progress Notes (Signed)
[facility] Progress Note  Name: Theresa STADEL MRN: [id number] DOB: 12/24/1940  Chief Complaint:  AMS, hematuria  Subjective:   Pt with 1.9 liters UOP over 1/1 CXR to f/u PNA today showed increased upper lobe opacities She tells me she just wants to go home; SNF bed being pursued  Review of systems:  Unable to reliably obtain 2/2 AMS, which is overall improving  She denies shortness of breath, CP or n/v Urinating via foley  --------------- Background on consult:  Theresa Blake is a 78 y.o. female with a history of HTN and DM who presented with confusion and hematuria.  She is altered and not able to provide any history.  I spoke with her son [name] (at [phone]) and he provided the history below.  He reports that she has been confused and "panicky" recently.  There has been blood in her urine and sediment for the past few weeks.  She has not been eating for the past 6 days but reports drinking some water.  Her son states that a week and a half ago she was "normal".  Per her son, she had uterine cancer and was treated 4-5 years ago at [facility] with radiation for the same.  Work-up is notable for UA with 6-10 RBC and 30 mg/dl protein.  CT a/p demonstrated moderate bilateral hydro without stone or clear source of obstruction.  CT also incidentally noted multiple bilateral pulmonary nodules which were characterized as concerning for metastatic disease.  Spoke with primary team and urology in ER and urology is planning for stents later today  Intake/Output Summary (Last 24 hours) at 10/26/2018 1233 Last data filed at 10/26/2018 1223 Gross per 24 hour  Intake 2710.63 ml  Output 1652 ml  Net 1058.63 ml    Vitals:  Vitals:   10/25/18 1818 10/25/18 2100 10/26/18 0455 10/26/18 0916  BP: (!) 148/81 (!) 141/79 140/82 (!) 145/74  Pulse: 86 84 80 79  Resp: 18 18 18 20   Temp: 97.6 F (36.4 C) 97.7 F (36.5 C) 97.8 F (36.6 C) 98.1 F (36.7 C)  TempSrc: Oral Oral Oral Oral  SpO2:  100% 95% 96% 95%  Weight:  121 kg    Height:         Physical Exam:   General elderly female in bed in no acute distress HEENT normocephalic atraumatic extraocular movements intact sclera anicteric Neck supple trachea midline Lungs clear anteriorly and unlabored at rest Heart regular rate and rhythm no rubs or gallops appreciated Abdomen soft nontender nondistended Extremities no pitting edema appreciated  Psych normal mood and affect GU - foley in place   Medications reviewed   Labs:  BMP Latest Ref Rng & Units 10/26/2018 10/25/2018 10/24/2018  Glucose 70 - 99 mg/dL 101(H) 102(H) 100(H)  BUN 8 - 23 mg/dL 62(H) 61(H) 63(H)  Creatinine 0.44 - 1.00 mg/dL 5.78(H) 5.71(H) 6.12(H)  Sodium 135 - 145 mmol/L 140 140 139  Potassium 3.5 - 5.1 mmol/L 4.1 3.9 4.2  Chloride 98 - 111 mmol/L 111 110 111  CO2 22 - 32 mmol/L 17(L) 19(L) 16(L)  Calcium 8.9 - 10.3 mg/dL 9.3 9.4 9.1     Assessment/Plan:   # AKI on CKD 3 secondary to DM, HTN with BL ~1.5 - AKI secondary to obstruction from suspected recurrent endometrial Ca now s/p bilateral ureteral stents  - She is improving with supportive care with creatinine down from peak 9/4 on 12/28 to 5.7 today  - Lasix 40 IV today to maintain  euvolemia - Continue foley catheter per urology  - tapering effexor given renal function   # Obstructive uropathy - s/p stenting and with foley in place (12/18 Dr. Diona Fanti) - Concern for recurrence of uterine cancer with metastatic disease  # Metabolic acidosis  - Secondary to AKI as well as note metformin use in setting of AKI  - Continue oral sodium bicarbonate and supportive care for AKI  - serum bicarb today 17 on 1300 BID, CTM  # HTN  - Fair control on metoprolol 50 BID  # AMS/encephalopathy - Secondary to acute illness - Supportive care - Family (son) is making decisions at this time  # Pulmonary nodules, possible PNA -> CXR today looks worse - on rocephic and azithromycin per primary  -  Concerning for metastatic disease per CT  - Per primary team   # DM  - Would hold metformin with AKI.  Would not resume on discharge  #h/o PE: was on eliquis but has been switched to heparin gtt in light of acute issues.  Primary may start coumadin.      Justin Mend, MD 10/26/2018 12:33 PM

## 2018-10-26 NOTE — Progress Notes (Addendum)
PROGRESS NOTE                                                                                                                                                                                                             Patient Demographics:    Theresa Blake, is a 78 y.o. female, DOB - [birth year], VVO:[id number]  Admit date - 10/21/2018   Admitting Physician Courage Denton Brick, MD  Outpatient Primary MD for the patient is Tracie Harrier, MD  LOS - 5  Chief Complaint  Patient presents with  . Altered Mental Status       Brief Narrative  [name]  is a 78 yo morbidly obese female with past medical history of DVT/PE on Eliquis, hypertension, hyperlipidemia, diabetes mellitus,  CKD III (baseline Cr 1.5), who presents with altered mental status, hematuria and abdominal discomfort, endometrial cancer was brought in for decreased mental status in the hospital found to be in acute renal failure, hyperkalemic, bilateral hydronephrosis with ureteric obstruction.  Nephrology and urology were consulted and we were requested to admit.    Subjective:   Patient in bed, appears comfortable, denies any headache, no fever, no chest pain or pressure, no shortness of breath , no abdominal pain. No focal weakness.   Assessment  & Plan :     1.  ARF, hyperkalemia due to bilateral hydronephrosis - this most likely is due to underlying progression of endometrial cancer which she has history of, she has been seen by urology and nephrology, she underwent cystoscopy with bilateral ureteric stent placement by Dr. Diona Fanti urology on 10/21/2018.   Nephrology and urology both saw the patient, nephrology following and managing IV fluids and renal function, renal function seems to be plateauing now, continues to be on low-dose IV fluids, initiated PT will require SNF.  If renal function stays stable and nephrology does not want to pursue any other work-up may be discharged in the next 1 to 2 days.   Social work consulted to find SNF bed.  After discharge she will require follow-up with Dr. Diona Fanti urology and nephrology.   2.  History of endometrial cancer now possibility of pulmonary mets and bilateral hydronephrosis.  Once stable outpatient allergy and oncology follow-up, called son [name] with no response.  Discussed with her sons outpatient urology and if needed oncology follow-up, for now they want to continue aggressive care and full code.  3.  History of PE.  Was on Eliquis currently will place her on heparin drip due to  renal function and monitor.  If renal function does not allow requests she can be discharged on Lovenox Coumadin overlap.  4.  Acute metabolic acidosis.  Due to #1 above.  Placed on oral bicarb.  Monitor closely.  5.  Metabolic encephalopathy.  Due to #1 above.  Improving gradually.  6.  Hypothyroidism.  On Synthroid continue.  7.  GERD.  PPI.  8.  Dyslipidemia.  On statin continue.  9.  Possible pneumonia.  On Rocephin and azithromycin, overall improving continue to monitor, check a 2 view CXR today.  Stop date for antibiotics 10/27/2018 if stable.  10.  Hypomagnesemia.  Replaced will recheck Mag tomorrow.    11. DM 2 - ISS, only daytime with stable sugars, nighttime insulin was stopped due to low morning CBGs.  CBG (last 3)  Recent Labs    10/25/18 1641 10/25/18 2059 10/26/18 0742  GLUCAP 96 104* 96   Lab Results  Component Value Date   HGBA1C 6.3 (H) 07/03/2018      Family Communication  :   Both sons over the phone on 10/22/2018.  Code Status :  Full  Disposition Plan  : Transfer to medical bed, SNF upon discharge, PT eval, social work consulted  Consults  :  Renal, Urology  Procedures  :    CT - 1. Moderate bilateral hydroureteronephrosis. No nephroureterolithiasis or other clear source of obstruction. 2. Multiple bibasilar pulmonary nodules, measuring up to 10 mm, new since 11/18/17. These are concerning for metastatic disease. 3.  Limited assessment of the uterine parenchyma without IV contrast material. Generally unchanged appearance of anterior fundal region of hypoattenuation. Aortic Atherosclerosis  DVT Prophylaxis  :  Hep gtt  Lab Results  Component Value Date   PLT 362 10/26/2018    Diet :  Diet Order            Diet renal/carb modified with fluid restriction Diet-HS Snack? Nothing; Fluid restriction: Other (see comments); Room service appropriate? Yes; Fluid consistency: Thin  Diet effective now               Inpatient Medications  Scheduled Meds: . aspirin  81 mg Oral Daily  . Chlorhexidine Gluconate Cloth  6 each Topical Q0600  . clonazePAM  0.5 mg Oral BID  . ezetimibe-simvastatin  1 tablet Oral QHS  . fluticasone  2 spray Each Nare Daily  . levothyroxine  100 mcg Oral QAC breakfast  . metoprolol tartrate  50 mg Oral BID  . multivitamin with minerals  1 tablet Oral Daily  . mupirocin ointment  1 application Nasal BID  . pantoprazole  40 mg Oral Daily  . sodium bicarbonate  1,300 mg Oral BID  . venlafaxine XR  150 mg Oral Daily   Continuous Infusions: . sodium chloride 50 mL/hr at 10/26/18 0211  . albuterol 10 mg/hr (10/21/18 0653)  . azithromycin 500 mg (10/26/18 2683)  . cefTRIAXone (ROCEPHIN)  IV 1 g (10/26/18 0516)  . heparin 1,250 Units/hr (10/25/18 1852)   PRN Meds:.acetaminophen **OR** [DISCONTINUED] acetaminophen, albuterol, [DISCONTINUED] ondansetron **OR** ondansetron (ZOFRAN) IV, polyethylene glycol, traZODone  Antibiotics  :   Anti-infectives (From admission, onward)   Start     Dose/Rate Route Frequency Ordered Stop   10/22/18 0600  cefTRIAXone (ROCEPHIN) 1 g in sodium chloride 0.9 % 100 mL IVPB     1 g 200 mL/hr over 30 Minutes Intravenous Every 24 hours 10/21/18 1438     10/22/18 0600  azithromycin (ZITHROMAX) 500 mg in sodium chloride 0.9 %  250 mL IVPB     500 mg 250 mL/hr over 60 Minutes Intravenous Every 24 hours 10/21/18 1438     10/21/18 0445  cefTRIAXone  (ROCEPHIN) 1 g in sodium chloride 0.9 % 100 mL IVPB     1 g 200 mL/hr over 30 Minutes Intravenous  Once 10/21/18 0435 10/21/18 0540   10/21/18 0445  azithromycin (ZITHROMAX) 500 mg in sodium chloride 0.9 % 250 mL IVPB     500 mg 250 mL/hr over 60 Minutes Intravenous  Once 10/21/18 0435 10/21/18 0607        Objective:   Vitals:   10/25/18 0943 10/25/18 1818 10/25/18 2100 10/26/18 0455  BP: (!) 150/93 (!) 148/81 (!) 141/79 140/82  Pulse: (!) 106 86 84 80  Resp: 18 18 18 18   Temp: 97.8 F (36.6 C) 97.6 F (36.4 C) 97.7 F (36.5 C) 97.8 F (36.6 C)  TempSrc: Oral Oral Oral Oral  SpO2: 97% 100% 95% 96%  Weight:   121 kg   Height:        Wt Readings from Last 3 Encounters:  10/25/18 121 kg  07/08/18 114.4 kg  11/30/17 (!) 142.4 kg     Intake/Output Summary (Last 24 hours) at 10/26/2018 0903 Last data filed at 10/26/2018 3151 Gross per 24 hour  Intake 2590.63 ml  Output 1102 ml  Net 1488.63 ml     Physical Exam  Awake mildly confused, mild tremors chronic - R>L, No new F.N deficits, Normal affect Isabela.AT,PERRAL Supple Neck,No JVD, No cervical lymphadenopathy appriciated.  Symmetrical Chest wall movement, Good air movement bilaterally, CTAB RRR,No Gallops, Rubs or new Murmurs, No Parasternal Heave +ve B.Sounds, Abd Soft, No tenderness, No organomegaly appriciated, No rebound - guarding or rigidity. No Cyanosis, Clubbing or edema, No new Rash or bruise     Data Review:    CBC Recent Labs  Lab 10/21/18 0334 10/22/18 0612 10/23/18 0622 10/24/18 0758 10/25/18 0541 10/26/18 0233  WBC 9.2 10.0 9.1 11.0* 11.6* 12.9*  HGB 10.7* 9.3* 9.6* 9.5* 9.3* 9.3*  HCT 34.0* 29.7* 30.6* 31.1* 29.4* 30.6*  PLT 375 302 337 341 371 362  MCV 91.9 92.2 91.6 90.9 90.2 90.3  MCH 28.9 28.9 28.7 27.8 28.5 27.4  MCHC 31.5 31.3 31.4 30.5 31.6 30.4  RDW 17.8* 18.2* 18.3* 18.5* 18.4* 18.6*  LYMPHSABS 1.4  --   --   --   --   --   MONOABS 1.0  --   --   --   --   --   EOSABS 0.5  --   --    --   --   --   BASOSABS 0.1  --   --   --   --   --     Chemistries  Recent Labs  Lab 10/21/18 0334  10/22/18 0612 10/23/18 0622 10/24/18 0758 10/25/18 0541 10/26/18 0233  NA 134*   < > 137 139 139 140 140  K 6.0*   < > 5.4* 4.5 4.2 3.9 4.1  CL 102   < > 108 110 111 110 111  CO2 15*   < > 17* 16* 16* 19* 17*  GLUCOSE 75   < > 94 94 100* 102* 101*  BUN 98*   < > 87* 73* 63* 61* 62*  CREATININE 9.39*   < > 8.20* 7.12* 6.12* 5.71* 5.78*  CALCIUM 10.0   < > 9.1 9.1 9.1 9.4 9.3  MG  --   --   --   --   --   --  1.2*  AST 8*  --   --   --   --   --   --   ALT 5  --   --   --   --   --   --   ALKPHOS 77  --   --   --   --   --   --   BILITOT 0.5  --   --   --   --   --   --    < > = values in this interval not displayed.   ------------------------------------------------------------------------------------------------------------------ No results for input(s): CHOL, HDL, LDLCALC, TRIG, CHOLHDL, LDLDIRECT in the last 72 hours.  Lab Results  Component Value Date   HGBA1C 6.3 (H) 07/03/2018   ------------------------------------------------------------------------------------------------------------------ No results for input(s): TSH, T4TOTAL, T3FREE, THYROIDAB in the last 72 hours.  Invalid input(s): FREET3 ------------------------------------------------------------------------------------------------------------------ No results for input(s): VITAMINB12, FOLATE, FERRITIN, TIBC, IRON, RETICCTPCT in the last 72 hours.  Coagulation profile No results for input(s): INR, PROTIME in the last 168 hours.  No results for input(s): DDIMER in the last 72 hours.  Cardiac Enzymes Recent Labs  Lab 10/21/18 0405  TROPONINI 0.10*   ------------------------------------------------------------------------------------------------------------------    Component Value Date/Time   BNP 1,250.6 (H) 10/21/2018 5732    Micro Results Recent Results (from the past 240 hour(s))  Blood  Culture (routine x 2)     Status: None (Preliminary result)   Collection Time: 10/21/18  3:15 AM  Result Value Ref Range Status   Specimen Description BLOOD RIGHT ARM  Final   Special Requests   Final    BOTTLES DRAWN AEROBIC AND ANAEROBIC Blood Culture adequate volume   Culture  Setup Time NO ORGANISMS SEEN  Final   Culture   Final    NO GROWTH 4 DAYS Performed at [facility], [address], [place], [place] [address]    Report Status PENDING  Incomplete  Blood Culture (routine x 2)     Status: None (Preliminary result)   Collection Time: 10/21/18  3:30 AM  Result Value Ref Range Status   Specimen Description BLOOD RIGHT HAND  Final   Special Requests   Final    BOTTLES DRAWN AEROBIC ONLY Blood Culture adequate volume   Culture   Final    NO GROWTH 4 DAYS Performed at [facility], [place] [address], [place], [place] [address]    Report Status PENDING  Incomplete  Urine culture     Status: None   Collection Time: 10/21/18  5:51 AM  Result Value Ref Range Status   Specimen Description URINE, CATHETERIZED  Final   Special Requests NONE  Final   Culture   Final    NO GROWTH Performed at [facility], [address], [place], [place] [address]    Report Status 10/22/2018 FINAL  Final  MRSA PCR Screening     Status: Abnormal   Collection Time: 10/22/18  8:08 AM  Result Value Ref Range Status   MRSA by PCR POSITIVE (A) NEGATIVE Final    Comment:        The GeneXpert MRSA Assay (FDA approved for NASAL specimens only), is one component of a comprehensive MRSA colonization surveillance program. It is not intended to diagnose MRSA infection nor to guide or monitor treatment for MRSA infections. CRITICAL RESULT CALLED TO, READ BACK BY AND VERIFIED WITH: L. BENNET, RN AT 0945 ON 10/22/18 BY C. JESSUP, MLT. Performed at [facility], [place]  [address] [place], [place] [address]     Radiology Reports Ct Abdomen Pelvis Wo Contrast  Result Date:  10/21/2018 CLINICAL DATA:  Abdominal pain and confusion. EXAM: CT ABDOMEN AND PELVIS WITHOUT CONTRAST TECHNIQUE: Multidetector CT imaging of the abdomen and pelvis was performed following the standard protocol without IV contrast. COMPARISON:  CT abdomen pelvis 11/18/2017 FINDINGS: LOWER CHEST: Multiple bibasilar pulmonary nodules, measuring up to 10 mm. HEPATOBILIARY: The hepatic contours and density are normal. There is no intra- or extrahepatic biliary dilatation. The gallbladder is normal. PANCREAS: The pancreatic parenchymal contours are normal and there is no ductal dilatation. There is no peripancreatic fluid collection. SPLEEN: Normal. ADRENALS/URINARY TRACT: --Adrenal glands: Normal. --Right kidney/ureter: There is moderate hydroureteronephrosis. No nephroureterolithiasis or other clear source of obstruction. --Left kidney/ureter: There is moderate hydroureteronephrosis. No nephroureterolithiasis or other clear source of obstruction. --Urinary bladder: Urinary bladder is decompressed. STOMACH/BOWEL: --Stomach/Duodenum: There is no hiatal hernia or other gastric abnormality. The duodenal course and caliber are normal. --Small bowel: No dilatation or inflammation. --Colon: No focal abnormality. --Appendix: Not visualized. No right lower quadrant inflammation or free fluid. VASCULAR/LYMPHATIC: Atherosclerotic calcification is present within the non-aneurysmal abdominal aorta, without hemodynamically significant stenosis. No abdominal or pelvic lymphadenopathy. REPRODUCTIVE: There is a T-shaped contraceptive device within the uterus. Area of hypoattenuation at the anterior uterine fundus appears unchanged, though poorly characterized without IV contrast MUSCULOSKELETAL. Multilevel degenerative disc disease and facet arthrosis. No bony spinal canal stenosis. OTHER: None. IMPRESSION: 1. Moderate bilateral hydroureteronephrosis. No nephroureterolithiasis or other clear source of obstruction. 2. Multiple bibasilar  pulmonary nodules, measuring up to 10 mm, new since 11/18/17. These are concerning for metastatic disease. 3. Limited assessment of the uterine parenchyma without IV contrast material. Generally unchanged appearance of anterior fundal region of hypoattenuation. Aortic Atherosclerosis (ICD10-I70.0). Electronically Signed   By: Ulyses Jarred M.D.   On: 10/21/2018 06:02   Dg Chest 2 View  Result Date: 10/21/2018 CLINICAL DATA:  Acute onset of altered mental status. Confusion. EXAM: CHEST - 2 VIEW COMPARISON:  Chest radiograph performed 07/06/2018, and CTA of the chest performed 07/03/2018 FINDINGS: The lungs are well-aerated. Patchy bilateral airspace opacification is similar in appearance to prior studies and may reflect recurrent pulmonary edema or possibly pneumonia. A small left pleural effusion is again noted. No pneumothorax is seen. The cardiomediastinal silhouette is borderline normal in size. There appears to be a mildly displaced fracture of the left lateral fifth rib. IMPRESSION: 1. Patchy bilateral airspace opacification is similar in appearance to prior studies and may reflect recurrent pulmonary edema or possibly pneumonia. Small left pleural effusion again noted. 2. Apparent mildly displaced fracture of the left lateral fifth rib. Electronically Signed   By: Garald Balding M.D.   On: 10/21/2018 04:24   Ct Head Wo Contrast  Result Date: 10/21/2018 CLINICAL DATA:  Acute onset of worsening confusion. Hematuria. EXAM: CT HEAD WITHOUT CONTRAST TECHNIQUE: Contiguous axial images were obtained from the base of the skull through the vertex without intravenous contrast. COMPARISON:  CT of the head performed 04/24/2018 FINDINGS: Brain: No evidence of acute infarction, hemorrhage, hydrocephalus, extra-axial collection or mass lesion / mass effect. Prominence of the ventricles and sulci reflects mild cortical volume loss. Diffuse periventricular and subcortical white matter change likely reflects small  vessel ischemic microangiopathy. Chronic ischemic change is noted at the basal ganglia bilaterally. Mild cerebellar atrophy is noted. The brainstem and fourth ventricle are within normal limits. The basal ganglia are unremarkable in appearance. The cerebral hemispheres demonstrate grossly normal gray-white  differentiation. No mass effect or midline shift is seen. Vascular: No hyperdense vessel or unexpected calcification. Skull: There is no evidence of fracture; visualized osseous structures are unremarkable in appearance. Sinuses/Orbits: The orbits are within normal limits. The paranasal sinuses and mastoid air cells are well-aerated. Other: No significant soft tissue abnormalities are seen. IMPRESSION: 1. No acute intracranial pathology seen on CT. 2. Mild cortical volume loss and diffuse small vessel ischemic microangiopathy. 3. Chronic ischemic change at the basal ganglia bilaterally. Electronically Signed   By: Garald Balding M.D.   On: 10/21/2018 05:55   Dg Chest Port 1 View  Result Date: 10/23/2018 CLINICAL DATA:  Shortness of breath. EXAM: PORTABLE CHEST 1 VIEW COMPARISON:  Radiographs of October 21, 2018. FINDINGS: Stable cardiomediastinal silhouette. No pneumothorax or pleural effusion is noted. Right lung is clear. Patchy opacity is noted in left midlung laterally concerning for possible pneumonia. Bony thorax is unremarkable. IMPRESSION: Probable left midlung pneumonia. Followup PA and lateral chest X-ray is recommended in 3-4 weeks following trial of antibiotic therapy to ensure resolution and exclude underlying malignancy. Electronically Signed   By: Marijo Conception, M.D.   On: 10/23/2018 07:54   Dg C-arm 1-60 Min-no Report  Result Date: 10/21/2018 Fluoroscopy was utilized by the requesting physician.  No radiographic interpretation.    Time Spent in minutes  30   Lala Lund M.D on 10/26/2018 at 9:03 AM  To page go to [email or web address] - password [facility]

## 2018-10-26 NOTE — Evaluation (Signed)
Physical Therapy Evaluation Patient Details Name: Theresa Blake MRN: [id number] DOB: 1941/01/01 Today's Date: 10/26/2018   History of Present Illness  78 yo morbidly obese female with past medical history of DVT/PE on Eliquis, hypertension, hyperlipidemia, diabetes mellitus,  CKD III (baseline Cr 1.5), who presents with altered mental status, hematuria and abdominal discomfort, endometrial cancer was brought in for decreased mental status in the hospital found to be in acute renal failure, hyperkalemic, bilateral hydronephrosis with ureteric obstruction.  s/p B ureteric stent placement 10/21/18.   Clinical Impression  Pt admitted with above diagnosis. Pt currently with functional limitations due to the deficits listed below (see PT Problem List). Total assist for supine to sit, pt unable to achieve full upright sitting position 2* back pain. Performed BLE AAROM exercises. Pt is quite debilitated and requires mechanical lift for transfers at present. She exhibits some confusion, oriented to self and location but stated year is 1999 and that she's seeing bugs on her.  SNF recommended.  Pt will benefit from skilled PT to increase their independence and safety with mobility to allow discharge to the venue listed below.       Follow Up Recommendations SNF;Supervision for mobility/OOB;Supervision/Assistance - 24 hour    Equipment Recommendations  None recommended by PT    Recommendations for Other Services       Precautions / Restrictions Precautions Precautions: Fall Restrictions Weight Bearing Restrictions: No      Mobility  Bed Mobility Overal bed mobility: Needs Assistance Bed Mobility: Supine to Sit     Supine to sit: +2 for safety/equipment;Total assist     General bed mobility comments: attempted supine to sit, pt stopped mid-way due to increased back pain and was unable to continue, she did not achieve full upright sitting position, returned to supine  Transfers                     Ambulation/Gait                Stairs            Wheelchair Mobility    Modified Rankin (Stroke Patients Only)       Balance                                             Pertinent Vitals/Pain Pain Assessment: Faces Pain Score: 8  Pain Location: back, with attempted bed mobility; no pain at rest Pain Descriptors / Indicators: Grimacing;Guarding Pain Intervention(s): Limited activity within patient's tolerance;Monitored during session;Repositioned    Home Living Family/patient expects to be discharged to:: Skilled nursing facility                 Additional Comments: lives with son, has WC    Prior Function Level of Independence: Needs assistance   Gait / Transfers Assistance Needed: pt poor historian, stated she was ambulatory a couple months ago, prior PT notes state she's been WC/bedbound for more than a year           Hand Dominance   Dominant Hand: Right    Extremity/Trunk Assessment   Upper Extremity Assessment Upper Extremity Assessment: Generalized weakness;Defer to OT evaluation    Lower Extremity Assessment Lower Extremity Assessment: RLE deficits/detail;LLE deficits/detail RLE Deficits / Details: -2/5; AAROM grossly WFL RLE Sensation: WNL LLE Deficits / Details: -2/5; AAROM grossly WFL LLE Sensation: WNL  Communication   Communication: No difficulties  Cognition Arousal/Alertness: Awake/alert Behavior During Therapy: WFL for tasks assessed/performed Overall Cognitive Status: Impaired/Different from baseline Area of Impairment: Orientation;Memory                 Orientation Level: Time             General Comments: stated year is 1999, oriented to self and location, can state name of president, stated she's seeing bugs on her, no family present but pt admitted with AMS so assume this is different from baseline      General Comments      Exercises General Exercises -  Lower Extremity Ankle Circles/Pumps: AROM;Both;5 reps;Supine Heel Slides: AAROM;Both;10 reps;Supine Hip ABduction/ADduction: AAROM;Both;10 reps;Supine   Assessment/Plan    PT Assessment Patient needs continued PT services  PT Problem List Decreased strength;Decreased activity tolerance;Obesity;Pain;Decreased mobility;Decreased cognition       PT Treatment Interventions Functional mobility training;Therapeutic activities;Therapeutic exercise;Patient/family education;Balance training    PT Goals (Current goals can be found in the Care Plan section)  Acute Rehab PT Goals Patient Stated Goal: none stated PT Goal Formulation: Patient unable to participate in goal setting Time For Goal Achievement: 11/09/18 Potential to Achieve Goals: Fair    Frequency Min 2X/week   Barriers to discharge        Co-evaluation               AM-PAC PT "6 Clicks" Mobility  Outcome Measure Help needed turning from your back to your side while in a flat bed without using bedrails?: A Lot Help needed moving from lying on your back to sitting on the side of a flat bed without using bedrails?: Total Help needed moving to and from a bed to a chair (including a wheelchair)?: Total Help needed standing up from a chair using your arms (e.g., wheelchair or bedside chair)?: Total Help needed to walk in hospital room?: Total Help needed climbing 3-5 steps with a railing? : Total 6 Click Score: 7    End of Session   Activity Tolerance: Patient limited by pain Patient left: in bed;with bed alarm set;with call bell/phone within reach Nurse Communication: Mobility status;Need for lift equipment PT Visit Diagnosis: Muscle weakness (generalized) (M62.81);Difficulty in walking, not elsewhere classified (R26.2);Other abnormalities of gait and mobility (R26.89)    Time: 1093-2355 PT Time Calculation (min) (ACUTE ONLY): 17 min   Charges:   PT Evaluation $PT Eval Moderate Complexity: 1 Mod          Philomena Doheny PT 10/26/2018  Acute Rehabilitation Services Pager [phone] Office [phone]

## 2018-10-27 DIAGNOSIS — C541 Malignant neoplasm of endometrium: Secondary | ICD-10-CM

## 2018-10-27 DIAGNOSIS — J189 Pneumonia, unspecified organism: Secondary | ICD-10-CM

## 2018-10-27 DIAGNOSIS — I10 Essential (primary) hypertension: Secondary | ICD-10-CM

## 2018-10-27 LAB — CBC
HCT: 31.4 % — ABNORMAL LOW (ref 36.0–46.0)
Hemoglobin: 9.6 g/dL — ABNORMAL LOW (ref 12.0–15.0)
MCH: 28.1 pg (ref 26.0–34.0)
MCHC: 30.6 g/dL (ref 30.0–36.0)
MCV: 91.8 fL (ref 80.0–100.0)
NRBC: 0 % (ref 0.0–0.2)
Platelets: 377 10*3/uL (ref 150–400)
RBC: 3.42 MIL/uL — ABNORMAL LOW (ref 3.87–5.11)
RDW: 18.7 % — ABNORMAL HIGH (ref 11.5–15.5)
WBC: 14.4 10*3/uL — ABNORMAL HIGH (ref 4.0–10.5)

## 2018-10-27 LAB — BASIC METABOLIC PANEL
ANION GAP: 12 (ref 5–15)
BUN: 56 mg/dL — ABNORMAL HIGH (ref 8–23)
CO2: 16 mmol/L — ABNORMAL LOW (ref 22–32)
Calcium: 9.8 mg/dL (ref 8.9–10.3)
Chloride: 113 mmol/L — ABNORMAL HIGH (ref 98–111)
Creatinine, Ser: 5.34 mg/dL — ABNORMAL HIGH (ref 0.44–1.00)
GFR calc Af Amer: 8 mL/min — ABNORMAL LOW (ref >60)
GFR, EST NON AFRICAN AMERICAN: 7 mL/min — AB (ref >60)
Glucose, Bld: 108 mg/dL — ABNORMAL HIGH (ref 70–99)
Potassium: 3.7 mmol/L (ref 3.5–5.1)
Sodium: 141 mmol/L (ref 135–145)

## 2018-10-27 LAB — GLUCOSE, CAPILLARY
GLUCOSE-CAPILLARY: 90 mg/dL (ref 70–99)
Glucose-Capillary: 86 mg/dL (ref 70–99)
Glucose-Capillary: 80 mg/dL (ref 70–99)

## 2018-10-27 LAB — MAGNESIUM: Magnesium: 1.5 mg/dL — ABNORMAL LOW (ref 1.7–2.4)

## 2018-10-27 LAB — HEPARIN LEVEL (UNFRACTIONATED): Heparin Unfractionated: 0.52 IU/mL (ref 0.30–0.70)

## 2018-10-27 MED ORDER — FUROSEMIDE 40 MG PO TABS
40.0000 mg | ORAL_TABLET | Freq: Every day | ORAL | Status: DC
  Administered 2018-10-27 – 2018-10-29 (×3): 40 mg via ORAL
  Filled 2018-10-27 (×3): qty 1

## 2018-10-27 MED ORDER — SODIUM BICARBONATE 650 MG PO TABS
1300.0000 mg | ORAL_TABLET | Freq: Three times a day (TID) | ORAL | Status: DC
  Administered 2018-10-27 – 2018-10-31 (×11): 1300 mg via ORAL
  Filled 2018-10-27 (×11): qty 2

## 2018-10-27 NOTE — Progress Notes (Signed)
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 Anesthetics, Halogenated Shortness Of Breath    10/22/18 Spoke to patients son.  Shortness of breath occurred with Ether when patient was a child.    [place] [Levofloxacin In D5w] Itching  . Lamotrigine Itching  . Tramadol Rash   Patient Measurements: Height: 5\' 5"  (165.1 cm) Weight: 266 lb 12.1 oz (121 kg) IBW/kg (Calculated) : 57 Heparin Dosing Weight: 86 kg  Vital Signs: Temp: 97.7 F (36.5 C) (01/03 0939) Temp Source: Oral (01/03 0939) BP: 124/51 (01/03 0939) Pulse Rate: 82 (01/03 0939)  Labs: Recent Labs    10/25/18 0541 10/25/18 1521 10/26/18 0233 10/26/18 0234 10/26/18 0252 10/26/18 1152 10/27/18 0832  HGB 9.3*  --  9.3*  --   --   --  9.6*  HCT 29.4*  --  30.6*  --   --   --  31.4*  PLT 371  --  362  --   --   --  377  APTT 57* 136*  --   --  93*  --   --   HEPARINUNFRC 0.32  --   --  0.47  --  0.56 0.52  CREATININE 5.71*  --  5.78*  --   --   --  5.34*    Estimated Creatinine Clearance: 11.7 mL/min (A) (by C-G formula based on SCr of 5.34 mg/dL (H)).   Assessment: 78 y.o female morbidly obese w/ PMH DVT/PE on Eliquis, presents to ED 12/28 with AMS,  hematuria and abdominal discomfort, h/o endometrial cancer.  On Eliquis PTA, last dose taken pta was on 12/27 at 19:00.   Found to be in acute renal failure. Pharmacy consulted to start IV heparin while eliquis on hold due to acute renal failure.   Heparin level remains therapeutic at 0.52, H/H low but stable, plts wnl.  Goal of Therapy:  Heparin level 0.3-0.7 units/mL Monitor platelets by anticoagulation protocol: Yes   Plan:  Continue heparin gtt at 1250 units/hr Daily heparin level, CBC, s/s bleeding  Bertis Ruddy, PharmD Clinical Pharmacist Please check AMION for all [place] numbers 10/27/2018 9:55 AM

## 2018-10-27 NOTE — Progress Notes (Signed)
Patient had bloody vaginal drainage this morning. Patient is on heparin drip.Foley catheter intact and in place. Patient cleaned and given a bath.Ghimire,MD text paged.

## 2018-10-27 NOTE — Progress Notes (Signed)
Patients sister is at bedside and is requesting to speak with MD. MD paged.

## 2018-10-27 NOTE — Progress Notes (Signed)
[facility] Progress Note  Name: Theresa Blake MRN: [id number] DOB: 1940/12/12  Chief Complaint:  AMS, hematuria  Subjective:   Pt with 1.75 liters UOP over 1/2, neg + 530 CXR to f/u PNA yesterday showed increased upper lobe opacities She tells me she just wants to go home; SNF bed being pursued  Review of systems:  Unable to reliably obtain 2/2 AMS, which is overall improving  She denies shortness of breath, CP or n/v Urinating via foley  --------------- Background on consult:  CORNELIUS SCHUITEMA is a 78 y.o. female with a history of HTN and DM who presented with confusion and hematuria.  She is altered and not able to provide any history.  I spoke with her son [name] (at [phone]) and he provided the history below.  He reports that she has been confused and "panicky" recently.  There has been blood in her urine and sediment for the past few weeks.  She has not been eating for the past 6 days but reports drinking some water.  Her son states that a week and a half ago she was "normal".  Per her son, she had uterine cancer and was treated 4-5 years ago at [facility] with radiation for the same.  Work-up is notable for UA with 6-10 RBC and 30 mg/dl protein.  CT a/p demonstrated moderate bilateral hydro without stone or clear source of obstruction.  CT also incidentally noted multiple bilateral pulmonary nodules which were characterized as concerning for metastatic disease.  Spoke with primary team and urology in ER and urology is planning for stents later today  Intake/Output Summary (Last 24 hours) at 10/27/2018 1601 Last data filed at 10/27/2018 1502 Gross per 24 hour  Intake 2677.25 ml  Output 1850 ml  Net 827.25 ml    Vitals:  Vitals:   10/26/18 1709 10/26/18 2109 10/27/18 0456 10/27/18 0939  BP: (!) 150/78 136/79 (!) 151/71 (!) 124/51  Pulse: 82 79 77 82  Resp: 20 18 18 19   Temp: 98.4 F (36.9 C) 98 F (36.7 C) 97.7 F (36.5 C) 97.7 F (36.5 C)  TempSrc: Oral   Oral   SpO2: 96% 94% 95% 97%  Weight:      Height:         Physical Exam:   General elderly female in bed in no acute distress HEENT normocephalic atraumatic extraocular movements intact sclera anicteric Neck supple trachea midline Lungs clear anteriorly and unlabored at rest Heart regular rate and rhythm no rubs or gallops appreciated Abdomen soft nontender nondistended Extremities no pitting edema appreciated  Psych normal mood and affect GU - foley in place   Medications reviewed   Labs:  BMP Latest Ref Rng & Units 10/27/2018 10/26/2018 10/25/2018  Glucose 70 - 99 mg/dL 108(H) 101(H) 102(H)  BUN 8 - 23 mg/dL 56(H) 62(H) 61(H)  Creatinine 0.44 - 1.00 mg/dL 5.34(H) 5.78(H) 5.71(H)  Sodium 135 - 145 mmol/L 141 140 140  Potassium 3.5 - 5.1 mmol/L 3.7 4.1 3.9  Chloride 98 - 111 mmol/L 113(H) 111 110  CO2 22 - 32 mmol/L 16(L) 17(L) 19(L)  Calcium 8.9 - 10.3 mg/dL 9.8 9.3 9.4     Assessment/Plan:   # AKI on CKD 3 secondary to DM, HTN with BL ~1.5 - AKI secondary to obstruction from suspected recurrent endometrial Ca now s/p bilateral ureteral stents  - She is improving with supportive care with creatinine down from peak 9/4 on 12/28 to 5.3 today.  My hope is that she  continues to improve but I doubt she will get back to her previous baseline.  With her multiple comorbidities and poor functional status she would be a suboptimal dialysis candidate.  It is not currently indicated anyway.  - Start lasix 40 po daily to maintain euvolemia.  Can watch while she's still inpatient to make sure this dose will be ok - Continue foley catheter per urology   # Obstructive uropathy - s/p stenting and with foley in place (12/18 Dr. Diona Fanti) - Concern for recurrence of uterine cancer with metastatic disease  # Metabolic acidosis  - Secondary to AKI as well as note metformin use in setting of AKI  - Continue oral sodium bicarbonate and supportive care for AKI  - serum bicarb today 16 on 1300 BID  increase to 1300 TID  # HTN  - Fair control on metoprolol 50 BID  # AMS/encephalopathy - Secondary to acute illness - Supportive care - Family (son) is making decisions at this time  # Pulmonary nodules, possible PNA - on rocephic and azithromycin per primary  - Concerning for metastatic disease per CT  - Per primary team   # DM  - Should remain off metformin   #h/o PE: was on eliquis but has been switched to heparin gtt in light of acute issues.  Primary may start coumadin.      Justin Mend, MD 10/27/2018 4:01 PM

## 2018-10-27 NOTE — Discharge Summary (Addendum)
PROGRESS NOTE        PATIENT DETAILS Name: Theresa Blake Age: 78 y.o. Sex: female Date of Birth: Mar 02, 1941 Admit Date: 10/21/2018 Admitting Physician Courage Denton Brick, MD [id number], Cherlyn Labella, MD  Brief Narrative: Patient is a 78 y.o. female with history of morbid obesity, DVT/PE on anticoagulation, CKD stage III anterior cancer, chronic hypoxic respiratory failure on home O2 presented with acute metabolic encephalopathy in the setting of acute kidney injury.  She was found to have bilateral hydronephrosis-and underwent bilateral ureteral stent placement by urology.  Renal function slowly improving with supportive care.  See below for further details  Subjective: Sitting comfortably in bed-appears very weak and frail.  Somewhat confused-but able to be reoriented-follows most of my commands.  Assessment/Plan: Acute kidney injury: Thought to be secondary to obstructive uropathy from suspected recurrent endometrial cancer causing bilateral hydronephrosis-she is now status post ureteral stent placement by urology.  Renal function slowly improving but seems to have plateaued over the past few days.  Will await further recommendations from nephrology.  Acute metabolic encephalopathy: Likely secondary to above-CT head negative on admission.  Continue supportive care-not sure what her usual baseline is at (no family at bedside)-we will continue to follow.  Hopefully with improvement in her renal function-her mental status will continue to improve.  Hyperkalemia: Resolved, likely secondary to AKI.  Acute metabolic acidosis: Secondary to AKI-improved.  PNA: Afebrile-continues to have mild leukocytosis-remains on IV Rocephin and Zithromax  Hypothyroidism: Continue Synthroid  Dyslipidemia: Continue Vytorin  Hypertension: Controlled-continue metoprolol  GERD: Continue PPI  History of venous thromboembolism: Previously on Eliquis-currently on heparin infusion.  If  renal function does not improve-we will need to be placed on Coumadin on discharge.  History of endometrial cancer with probable recurrence causing bilateral hydronephrosis and possible pulmonary metastases: Prior MD-Dr. Woodroe Chen discussed with sons-plans are for outpatient oncology follow-up.  Addendum: Per patients sister-patient stopped following with her oncologist a few years back. She has been bedbound for a almost 2 years. We briefly spoke about poor long term prognoses-per patient's sister, patient probably would not want aggressive care-but needed to talk to the patients son about these issues. We agreed to have palliative care evaluate patient, I will attempt to call patients son tomorrow again.   Morbid obesity  Chronic hypoxic respiratory failure on home O2 3 L  Deconditioning/frailty: Not sure what her usual baseline is-but she appears incredibly weak-PT evaluation completed-SNF on discharge  DVT Prophylaxis: Full dose anticoagulation with Heparin  Code Status: Full code  Family Communication: Sister at bedside-called son-got voicemail  Disposition Plan: Remain inpatient-SNF on discharge-requires several more days of hospitalization.  Antimicrobial agents: Anti-infectives (From admission, onward)   Start     Dose/Rate Route Frequency Ordered Stop   10/22/18 0600  cefTRIAXone (ROCEPHIN) 1 g in sodium chloride 0.9 % 100 mL IVPB     1 g 200 mL/hr over 30 Minutes Intravenous Every 24 hours 10/21/18 1438     10/22/18 0600  azithromycin (ZITHROMAX) 500 mg in sodium chloride 0.9 % 250 mL IVPB     500 mg 250 mL/hr over 60 Minutes Intravenous Every 24 hours 10/21/18 1438     10/21/18 0445  cefTRIAXone (ROCEPHIN) 1 g in sodium chloride 0.9 % 100 mL IVPB     1 g 200 mL/hr over 30 Minutes Intravenous  Once 10/21/18 0435 10/21/18 0540  10/21/18 0445  azithromycin (ZITHROMAX) 500 mg in sodium chloride 0.9 % 250 mL IVPB     500 mg 250 mL/hr over 60 Minutes Intravenous  Once  10/21/18 0435 10/21/18 2423      Procedures: 12/28>>Cystoscopy, bilateral retrograde ureteral pyelograms with fluoroscopic interpretation, placement of bilateral double-J stents   CONSULTS:  urology  Time spent: 25- minutes-Greater than 50% of this time was spent in counseling, explanation of diagnosis, planning of further management, and coordination of care.  MEDICATIONS: Scheduled Meds: . aspirin  81 mg Oral Daily  . Chlorhexidine Gluconate Cloth  6 each Topical Q0600  . clonazePAM  0.5 mg Oral BID  . ezetimibe-simvastatin  1 tablet Oral QHS  . fluticasone  2 spray Each Nare Daily  . levothyroxine  100 mcg Oral QAC breakfast  . metoprolol tartrate  50 mg Oral BID  . multivitamin with minerals  1 tablet Oral Daily  . mupirocin ointment  1 application Nasal BID  . pantoprazole  40 mg Oral Daily  . sodium bicarbonate  1,300 mg Oral BID  . venlafaxine XR  150 mg Oral Daily   Continuous Infusions: . sodium chloride 50 mL/hr at 10/27/18 0054  . albuterol 10 mg/hr (10/21/18 0653)  . azithromycin 500 mg (10/27/18 0550)  . cefTRIAXone (ROCEPHIN)  IV Stopped (10/27/18 0550)  . heparin 1,250 Units/hr (10/26/18 1618)   PRN Meds:.acetaminophen **OR** [DISCONTINUED] acetaminophen, albuterol, [DISCONTINUED] ondansetron **OR** ondansetron (ZOFRAN) IV, polyethylene glycol, traZODone   PHYSICAL EXAM: Vital signs: Vitals:   10/26/18 1709 10/26/18 2109 10/27/18 0456 10/27/18 0939  BP: (!) 150/78 136/79 (!) 151/71 (!) 124/51  Pulse: 82 79 77 82  Resp: 20 18 18 19   Temp: 98.4 F (36.9 C) 98 F (36.7 C) 97.7 F (36.5 C) 97.7 F (36.5 C)  TempSrc: Oral   Oral  SpO2: 96% 94% 95% 97%  Weight:      Height:       Filed Weights   10/24/18 2205 10/25/18 0355 10/25/18 2100  Weight: 121 kg 121 kg 121 kg   Body mass index is 44.39 kg/m.   General appearance: Somewhat confused-but follows some commands. HEENT: Atraumatic and Normocephalic Neck: supple Resp:Good air entry  bilaterally, no added sounds  CVS: S1 S2 regular, no murmurs.  GI: Bowel sounds present, Non tender and not distended with no gaurding, rigidity or rebound.No organomegaly Extremities: B/L Lower Ext shows 1+ edema  Neurology: Difficult exam but moves all 4 extremities Musculoskeletal:No digital cyanosis Skin:No Rash, warm and dry Wounds:N/A  I have personally reviewed following labs and imaging studies  LABORATORY DATA: CBC: Recent Labs  Lab 10/21/18 0334  10/23/18 0622 10/24/18 0758 10/25/18 0541 10/26/18 0233 10/27/18 0832  WBC 9.2   < > 9.1 11.0* 11.6* 12.9* 14.4*  NEUTROABS 6.2  --   --   --   --   --   --   HGB 10.7*   < > 9.6* 9.5* 9.3* 9.3* 9.6*  HCT 34.0*   < > 30.6* 31.1* 29.4* 30.6* 31.4*  MCV 91.9   < > 91.6 90.9 90.2 90.3 91.8  PLT 375   < > 337 341 371 362 377   < > = values in this interval not displayed.    Basic Metabolic Panel: Recent Labs  Lab 10/21/18 1023  10/23/18 0622 10/24/18 0758 10/25/18 0541 10/26/18 0233 10/27/18 0832  NA 136   < > 139 139 140 140 141  K 5.0   < > 4.5 4.2 3.9 4.1  3.7  CL 104   < > 110 111 110 111 113*  CO2 14*   < > 16* 16* 19* 17* 16*  GLUCOSE 144*   < > 94 100* 102* 101* 108*  BUN 93*   < > 73* 63* 61* 62* 56*  CREATININE 9.02*   < > 7.12* 6.12* 5.71* 5.78* 5.34*  CALCIUM 9.8   < > 9.1 9.1 9.4 9.3 9.8  MG  --   --   --   --   --  1.2* 1.5*  PHOS 5.9*  --   --   --   --   --   --    < > = values in this interval not displayed.    GFR: Estimated Creatinine Clearance: 11.7 mL/min (A) (by C-G formula based on SCr of 5.34 mg/dL (H)).  Liver Function Tests: Recent Labs  Lab 10/21/18 0334 10/21/18 1023  AST 8*  --   ALT 5  --   ALKPHOS 77  --   BILITOT 0.5  --   PROT 6.8  --   ALBUMIN 2.8* 2.5*   Recent Labs  Lab 10/21/18 0405  LIPASE 44   No results for input(s): AMMONIA in the last 168 hours.  Coagulation Profile: No results for input(s): INR, PROTIME in the last 168 hours.  Cardiac Enzymes: Recent  Labs  Lab 10/21/18 0405 10/21/18 1023  CKTOTAL  --  18*  TROPONINI 0.10*  --     BNP (last 3 results) No results for input(s): PROBNP in the last 8760 hours.  HbA1C: No results for input(s): HGBA1C in the last 72 hours.  CBG: Recent Labs  Lab 10/26/18 0742 10/26/18 1058 10/26/18 1547 10/26/18 2108 10/27/18 0736  GLUCAP 96 101* 98 93 86    Lipid Profile: No results for input(s): CHOL, HDL, LDLCALC, TRIG, CHOLHDL, LDLDIRECT in the last 72 hours.  Thyroid Function Tests: No results for input(s): TSH, T4TOTAL, FREET4, T3FREE, THYROIDAB in the last 72 hours.  Anemia Panel: No results for input(s): VITAMINB12, FOLATE, FERRITIN, TIBC, IRON, RETICCTPCT in the last 72 hours.  Urine analysis:    Component Value Date/Time   COLORURINE YELLOW 10/21/2018 0551   APPEARANCEUR CLEAR 10/21/2018 0551   LABSPEC 1.011 10/21/2018 0551   PHURINE 6.0 10/21/2018 0551   GLUCOSEU NEGATIVE 10/21/2018 0551   HGBUR MODERATE (A) 10/21/2018 0551   HGBUR negative 02/10/2010 1135   BILIRUBINUR NEGATIVE 10/21/2018 0551   BILIRUBINUR neg 06/11/2013 1510   KETONESUR NEGATIVE 10/21/2018 0551   PROTEINUR 30 (A) 10/21/2018 0551   UROBILINOGEN 0.2 06/11/2013 1510   UROBILINOGEN 1.0 04/08/2012 1622   NITRITE NEGATIVE 10/21/2018 0551   LEUKOCYTESUR NEGATIVE 10/21/2018 0551    Sepsis Labs: Lactic Acid, Venous    Component Value Date/Time   LATICACIDVEN 2.9 (HH) 10/21/2018 1724    MICROBIOLOGY: Recent Results (from the past 240 hour(s))  Blood Culture (routine x 2)     Status: None   Collection Time: 10/21/18  3:15 AM  Result Value Ref Range Status   Specimen Description BLOOD RIGHT ARM  Final   Special Requests   Final    BOTTLES DRAWN AEROBIC AND ANAEROBIC Blood Culture adequate volume   Culture  Setup Time NO ORGANISMS SEEN  Final   Culture   Final    NO GROWTH 5 DAYS Performed at [facility], [address] [place], [place] [address]    Report Status 10/26/2018 FINAL  Final    Blood Culture (routine x 2)  Status: None   Collection Time: 10/21/18  3:30 AM  Result Value Ref Range Status   Specimen Description BLOOD RIGHT HAND  Final   Special Requests   Final    BOTTLES DRAWN AEROBIC ONLY Blood Culture adequate volume   Culture   Final    NO GROWTH 5 DAYS Performed at [facility], [address] [place], [place] [address]    Report Status 10/26/2018 FINAL  Final  Urine culture     Status: None   Collection Time: 10/21/18  5:51 AM  Result Value Ref Range Status   Specimen Description URINE, CATHETERIZED  Final   Special Requests NONE  Final   Culture   Final    NO GROWTH Performed at [facility], [place] [address] [place], [place] [address]    Report Status 10/22/2018 FINAL  Final  MRSA PCR Screening     Status: Abnormal   Collection Time: 10/22/18  8:08 AM  Result Value Ref Range Status   MRSA by PCR POSITIVE (A) NEGATIVE Final    Comment:        The GeneXpert MRSA Assay (FDA approved for NASAL specimens only), is one component of a comprehensive MRSA colonization surveillance program. It is not intended to diagnose MRSA infection nor to guide or monitor treatment for MRSA infections. CRITICAL RESULT CALLED TO, READ BACK BY AND VERIFIED WITH: L. BENNET, RN AT 0945 ON 10/22/18 BY C. JESSUP, MLT. Performed at [facility], [place] [address], [place], [place] [address]     RADIOLOGY STUDIES/RESULTS: Ct Abdomen Pelvis Wo Contrast  Result Date: 10/21/2018 CLINICAL DATA:  Abdominal pain and confusion. EXAM: CT ABDOMEN AND PELVIS WITHOUT CONTRAST TECHNIQUE: Multidetector CT imaging of the abdomen and pelvis was performed following the standard protocol without IV contrast. COMPARISON:  CT abdomen pelvis 11/18/2017 FINDINGS: LOWER CHEST: Multiple bibasilar pulmonary nodules, measuring up to 10 mm. HEPATOBILIARY: The hepatic contours and density are normal. There is no intra- or extrahepatic biliary dilatation. The gallbladder is  normal. PANCREAS: The pancreatic parenchymal contours are normal and there is no ductal dilatation. There is no peripancreatic fluid collection. SPLEEN: Normal. ADRENALS/URINARY TRACT: --Adrenal glands: Normal. --Right kidney/ureter: There is moderate hydroureteronephrosis. No nephroureterolithiasis or other clear source of obstruction. --Left kidney/ureter: There is moderate hydroureteronephrosis. No nephroureterolithiasis or other clear source of obstruction. --Urinary bladder: Urinary bladder is decompressed. STOMACH/BOWEL: --Stomach/Duodenum: There is no hiatal hernia or other gastric abnormality. The duodenal course and caliber are normal. --Small bowel: No dilatation or inflammation. --Colon: No focal abnormality. --Appendix: Not visualized. No right lower quadrant inflammation or free fluid. VASCULAR/LYMPHATIC: Atherosclerotic calcification is present within the non-aneurysmal abdominal aorta, without hemodynamically significant stenosis. No abdominal or pelvic lymphadenopathy. REPRODUCTIVE: There is a T-shaped contraceptive device within the uterus. Area of hypoattenuation at the anterior uterine fundus appears unchanged, though poorly characterized without IV contrast MUSCULOSKELETAL. Multilevel degenerative disc disease and facet arthrosis. No bony spinal canal stenosis. OTHER: None. IMPRESSION: 1. Moderate bilateral hydroureteronephrosis. No nephroureterolithiasis or other clear source of obstruction. 2. Multiple bibasilar pulmonary nodules, measuring up to 10 mm, new since 11/18/17. These are concerning for metastatic disease. 3. Limited assessment of the uterine parenchyma without IV contrast material. Generally unchanged appearance of anterior fundal region of hypoattenuation. Aortic Atherosclerosis (ICD10-I70.0). Electronically Signed   By: Ulyses Jarred M.D.   On: 10/21/2018 06:02   Dg Chest 2 View  Result Date: 10/26/2018 CLINICAL DATA:  Community acquired pneumonia. EXAM: CHEST - 2 VIEW  COMPARISON:  Radiograph October 23, 2018. FINDINGS: Stable cardiomegaly. No pneumothorax is noted. No significant pleural effusion is noted. Increased left upper lobe airspace opacity is noted concerning for worsening pneumonia. There also appears to be developing right upper lobe opacity which may represent pneumonia. Bony thorax is unremarkable. IMPRESSION: Increased bilateral upper lobe opacities are noted concerning for possible pneumonia. Continued radiographic follow-up is recommended. Electronically Signed   By: Marijo Conception, M.D.   On: 10/26/2018 10:18   Dg Chest 2 View  Result Date: 10/21/2018 CLINICAL DATA:  Acute onset of altered mental status. Confusion. EXAM: CHEST - 2 VIEW COMPARISON:  Chest radiograph performed 07/06/2018, and CTA of the chest performed 07/03/2018 FINDINGS: The lungs are well-aerated. Patchy bilateral airspace opacification is similar in appearance to prior studies and may reflect recurrent pulmonary edema or possibly pneumonia. A small left pleural effusion is again noted. No pneumothorax is seen. The cardiomediastinal silhouette is borderline normal in size. There appears to be a mildly displaced fracture of the left lateral fifth rib. IMPRESSION: 1. Patchy bilateral airspace opacification is similar in appearance to prior studies and may reflect recurrent pulmonary edema or possibly pneumonia. Small left pleural effusion again noted. 2. Apparent mildly displaced fracture of the left lateral fifth rib. Electronically Signed   By: Garald Balding M.D.   On: 10/21/2018 04:24   Ct Head Wo Contrast  Result Date: 10/21/2018 CLINICAL DATA:  Acute onset of worsening confusion. Hematuria. EXAM: CT HEAD WITHOUT CONTRAST TECHNIQUE: Contiguous axial images were obtained from the base of the skull through the vertex without intravenous contrast. COMPARISON:  CT of the head performed 04/24/2018 FINDINGS: Brain: No evidence of acute infarction, hemorrhage, hydrocephalus, extra-axial  collection or mass lesion / mass effect. Prominence of the ventricles and sulci reflects mild cortical volume loss. Diffuse periventricular and subcortical white matter change likely reflects small vessel ischemic microangiopathy. Chronic ischemic change is noted at the basal ganglia bilaterally. Mild cerebellar atrophy is noted. The brainstem and fourth ventricle are within normal limits. The basal ganglia are unremarkable in appearance. The cerebral hemispheres demonstrate grossly normal gray-white differentiation. No mass effect or midline shift is seen. Vascular: No hyperdense vessel or unexpected calcification. Skull: There is no evidence of fracture; visualized osseous structures are unremarkable in appearance. Sinuses/Orbits: The orbits are within normal limits. The paranasal sinuses and mastoid air cells are well-aerated. Other: No significant soft tissue abnormalities are seen. IMPRESSION: 1. No acute intracranial pathology seen on CT. 2. Mild cortical volume loss and diffuse small vessel ischemic microangiopathy. 3. Chronic ischemic change at the basal ganglia bilaterally. Electronically Signed   By: Garald Balding M.D.   On: 10/21/2018 05:55   Dg Chest Port 1 View  Result Date: 10/23/2018 CLINICAL DATA:  Shortness of breath. EXAM: PORTABLE CHEST 1 VIEW COMPARISON:  Radiographs of October 21, 2018. FINDINGS: Stable cardiomediastinal silhouette. No pneumothorax or pleural effusion is noted. Right lung is clear. Patchy opacity is noted in left midlung laterally concerning for possible pneumonia. Bony thorax is unremarkable. IMPRESSION: Probable left midlung pneumonia. Followup PA and lateral chest X-ray is recommended in 3-4 weeks following trial of antibiotic therapy to ensure resolution and exclude underlying malignancy. Electronically Signed   By: Marijo Conception, M.D.   On: 10/23/2018 07:54   Dg C-arm 1-60 Min-no Report  Result Date: 10/21/2018 Fluoroscopy was utilized by the requesting  physician.  No radiographic interpretation.     LOS: 6 days   Oren Binet, MD  [facility]  If 7PM-7AM, please contact night-coverage  Please page via [email or web address]-Password [email or web address]-click on MD name and type text message  10/27/2018, 11:15 AM

## 2018-10-28 DIAGNOSIS — R410 Disorientation, unspecified: Secondary | ICD-10-CM

## 2018-10-28 DIAGNOSIS — Z515 Encounter for palliative care: Secondary | ICD-10-CM

## 2018-10-28 DIAGNOSIS — Z7189 Other specified counseling: Secondary | ICD-10-CM

## 2018-10-28 DIAGNOSIS — G92 Toxic encephalopathy: Secondary | ICD-10-CM

## 2018-10-28 LAB — BASIC METABOLIC PANEL
Anion gap: 11 (ref 5–15)
BUN: 54 mg/dL — AB (ref 8–23)
CO2: 20 mmol/L — ABNORMAL LOW (ref 22–32)
CREATININE: 5.28 mg/dL — AB (ref 0.44–1.00)
Calcium: 9.9 mg/dL (ref 8.9–10.3)
Chloride: 111 mmol/L (ref 98–111)
GFR calc Af Amer: 8 mL/min — ABNORMAL LOW (ref >60)
GFR calc non Af Amer: 7 mL/min — ABNORMAL LOW (ref >60)
Glucose, Bld: 94 mg/dL (ref 70–99)
Potassium: 3.4 mmol/L — ABNORMAL LOW (ref 3.5–5.1)
SODIUM: 142 mmol/L (ref 135–145)

## 2018-10-28 LAB — CBC
HCT: 31.4 % — ABNORMAL LOW (ref 36.0–46.0)
Hemoglobin: 9.6 g/dL — ABNORMAL LOW (ref 12.0–15.0)
MCH: 27.9 pg (ref 26.0–34.0)
MCHC: 30.6 g/dL (ref 30.0–36.0)
MCV: 91.3 fL (ref 80.0–100.0)
Platelets: 388 10*3/uL (ref 150–400)
RBC: 3.44 MIL/uL — ABNORMAL LOW (ref 3.87–5.11)
RDW: 18.8 % — ABNORMAL HIGH (ref 11.5–15.5)
WBC: 12.9 10*3/uL — ABNORMAL HIGH (ref 4.0–10.5)
nRBC: 0 % (ref 0.0–0.2)

## 2018-10-28 LAB — GLUCOSE, CAPILLARY
GLUCOSE-CAPILLARY: 99 mg/dL (ref 70–99)
Glucose-Capillary: 57 mg/dL — ABNORMAL LOW (ref 70–99)
Glucose-Capillary: 120 mg/dL — ABNORMAL HIGH (ref 70–99)
Glucose-Capillary: 81 mg/dL (ref 70–99)
Glucose-Capillary: 95 mg/dL (ref 70–99)

## 2018-10-28 LAB — HEPARIN LEVEL (UNFRACTIONATED): Heparin Unfractionated: 0.32 IU/mL (ref 0.30–0.70)

## 2018-10-28 NOTE — NC FL2 (Signed)
[place] LEVEL OF CARE SCREENING TOOL     IDENTIFICATION  Patient Name: Theresa Blake Birthdate: [birth year] Sex: female Admission Date (Current Location): 10/21/2018  [facility] [place] Number:  Herbalist and Address:  [facility], [place] [address] [place], [place] [address]      Provider Number: [id number]  Attending Physician Name and Address:  Jonetta Osgood, MD  Relative Name and Phone Number:  Shareena, Nusz, [phone]; Lashae Wollenberg, Pandora Leiter, [phone]; Earma Nicolaou, Son, [phone]    Current Level of Care: Hospital Recommended Level of Care: [place] Prior Approval Number: [id number]  Date Approved/Denied: 11/29/17 PASRR Number:    Discharge Plan: SNF    Current Diagnoses: Patient Active Problem List   Diagnosis Date Noted  . Acute renal failure superimposed on stage 3 chronic kidney disease ([place]) 10/21/2018  . AKI (acute kidney injury) ([place]) 10/21/2018  . Bil Hydronephrosis--- No stones 10/21/2018  . Toxic metabolic encephalopathy 24/58/0998  . Pulmonary metastases (H/o endometrial carcinoma) 10/21/2018  . Acute on chronic diastolic CHF (congestive heart failure) ([place]) 07/03/2018  . Cognitive impairment 07/03/2018  . Bedbound 07/03/2018  . Bilateral pulmonary embolism ([place]) 11/27/2017  . Uterine bleeding 11/27/2017  . Endometrial cancer ([place]) 11/27/2017  . Left leg DVT ([place]) 11/27/2017  . Chronic respiratory failure with hypoxia ([place]) 11/27/2017  . DVT (deep venous thrombosis) ([place]) 11/14/2017  . Weakness   . Palliative care by specialist   . Vaginal bleeding 06/19/2017  . History of endometrial cancer   . Acute on chronic respiratory failure with hypoxia ([place])   . Multiple rib fractures 06/30/2016  . Broken ribs 06/30/2016  . Goals of care, counseling/discussion 03/23/2014  . Hypoxia 04/29/2013  . Spinal stenosis of lumbar region 04/12/2012  . Neck strain 11/05/2011  .  Wound of right leg 11/05/2011  . Cellulitis and abscess of leg 10/13/2011  . Sinusitis 10/13/2011  . Vertigo 10/13/2011  . Other screening mammogram 03/15/2011  . BACK PAIN, LUMBAR 12/22/2010  . HIP PAIN, RIGHT 12/21/2010  . ALLERGIC RHINITIS 07/22/2010  . Morbid obesity due to excess calories ([place]) 02/27/2010  . FUNGAL DERMATITIS 08/15/2009  . Hypothyroidism 07/13/2007  . Diabetes mellitus type 2 in obese ([place]) 03/24/2007  . Hyperlipidemia 03/24/2007  . PANIC DISORDER 03/24/2007  . Depression 03/24/2007  . Essential hypertension 03/24/2007  . FOLLICULITIS 33/82/5053  . BACK PAIN 03/24/2007  . EDEMA 03/24/2007  . PULMONARY EMBOLISM, HX OF 03/24/2007    Orientation RESPIRATION BLADDER Height & Weight     Self  O2(spo2 100, nasal cannula, flow rate 2) Incontinent(Has a urinary catheter) Weight: 268 lb 15.4 oz (122 kg) Height:  5\' 5"  (165.1 cm)  BEHAVIORAL SYMPTOMS/MOOD NEUROLOGICAL BOWEL NUTRITION STATUS      Continent Diet(Renal/Carb Modified)  AMBULATORY STATUS COMMUNICATION OF NEEDS Skin   Limited Assist   Bruising(On right and left arm)                       Personal Care Assistance Level of Assistance  Bathing, Feeding, Dressing, Total care Bathing Assistance: Limited assistance Feeding assistance: Limited assistance Dressing Assistance: Limited assistance Total Care Assistance: Limited assistance   Functional Limitations Info  Sight, Hearing, Speech Sight Info: Adequate Hearing Info: Adequate Speech Info: Adequate    SPECIAL CARE FACTORS FREQUENCY  PT (By licensed PT)     PT Frequency: 5x/wk  Contractures Contractures Info: Not present    Additional Factors Info  Code Status, Allergies Code Status Info: Full Code Allergies Info:  Anesthetics, Halogenated, Levaquin Levofloxacin In D5w, Lamotrigine, Tramadol           Current Medications (10/28/2018):  This is the current hospital active medication list Current  Facility-Administered Medications  Medication Dose Route Frequency Provider Last Rate Last Dose  . 0.9 %  sodium chloride infusion   Intravenous Continuous Claudia Desanctis, MD 50 mL/hr at 10/27/18 2146    . acetaminophen (TYLENOL) tablet 650 mg  650 mg Oral Q6H PRN Franchot Gallo, MD      . albuterol (PROVENTIL) (2.5 MG/3ML) 0.083% nebulizer solution 2.5 mg  2.5 mg Nebulization Q2H PRN Franchot Gallo, MD      . albuterol (PROVENTIL,VENTOLIN) solution continuous neb  10 mg/hr Nebulization Continuous Franchot Gallo, MD 2 mL/hr at 10/21/18 0653 10 mg/hr at 10/21/18 0653  . aspirin chewable tablet 81 mg  81 mg Oral Daily Franchot Gallo, MD   81 mg at 10/28/18 1032  . azithromycin (ZITHROMAX) 500 mg in sodium chloride 0.9 % 250 mL IVPB  500 mg Intravenous Q24H Franchot Gallo, MD   Stopped at 10/28/18 [phone]  . cefTRIAXone (ROCEPHIN) 1 g in sodium chloride 0.9 % 100 mL IVPB  1 g Intravenous Q24H Franchot Gallo, MD   Stopped at 10/28/18 [phone]  . clonazePAM (KLONOPIN) tablet 0.5 mg  0.5 mg Oral BID Thurnell Lose, MD   0.5 mg at 10/28/18 1031  . ezetimibe-simvastatin (VYTORIN) 10-20 MG per tablet 1 tablet  1 tablet Oral QHS Franchot Gallo, MD   1 tablet at 10/27/18 2126  . fluticasone (FLONASE) 50 MCG/ACT nasal spray 2 spray  2 spray Each Nare Daily Franchot Gallo, MD   2 spray at 10/28/18 1034  . furosemide (LASIX) tablet 40 mg  40 mg Oral Daily Justin Mend, MD   40 mg at 10/28/18 1031  . heparin ADULT infusion 100 units/mL (25000 units/222mL sodium chloride 0.45%)  1,250 Units/hr Intravenous Continuous Hammons, Kimberly B, RPH 12.5 mL/hr at 10/27/18 2149 1,250 Units/hr at 10/27/18 2149  . levothyroxine (SYNTHROID, LEVOTHROID) tablet 100 mcg  100 mcg Oral QAC breakfast Franchot Gallo, MD   100 mcg at 10/28/18 0841  . metoprolol tartrate (LOPRESSOR) tablet 50 mg  50 mg Oral BID Thurnell Lose, MD   50 mg at 10/28/18 1031  . multivitamin with minerals tablet 1  tablet  1 tablet Oral Daily Franchot Gallo, MD   1 tablet at 10/28/18 1031  . mupirocin ointment (BACTROBAN) 2 % 1 application  1 application Nasal BID Thurnell Lose, MD   1 application at 97/98/92 1033  . ondansetron (ZOFRAN) injection 4 mg  4 mg Intravenous Q6H PRN Franchot Gallo, MD      . pantoprazole (PROTONIX) EC tablet 40 mg  40 mg Oral Daily Franchot Gallo, MD   40 mg at 10/28/18 1031  . polyethylene glycol (MIRALAX / GLYCOLAX) packet 17 g  17 g Oral Daily PRN Franchot Gallo, MD   17 g at 10/27/18 2127  . sodium bicarbonate tablet 1,300 mg  1,300 mg Oral TID Justin Mend, MD   1,300 mg at 10/28/18 1037  . traZODone (DESYREL) tablet 50 mg  50 mg Oral QHS PRN Franchot Gallo, MD   50 mg at 10/25/18 2224  . venlafaxine XR (EFFEXOR-XR) 24 hr capsule 150 mg  150 mg Oral Daily Claudia Desanctis, MD   150 mg  at 10/28/18 1033     Discharge Medications: Please see discharge summary for a list of discharge medications.  Relevant Imaging Results:  Relevant Lab Results:   Additional Information SSN: [id number]  Philippa Chester Braeleigh Pyper, LCSWA

## 2018-10-28 NOTE — Progress Notes (Signed)
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 Anesthetics, Halogenated Shortness Of Breath    10/22/18 Spoke to patients son.  Shortness of breath occurred with Ether when patient was a child.    [place] [Levofloxacin In D5w] Itching  . Lamotrigine Itching  . Tramadol Rash   Patient Measurements: Height: 5\' 5"  (165.1 cm) Weight: 268 lb 15.4 oz (122 kg) IBW/kg (Calculated) : 57 Heparin Dosing Weight: 86 kg  Vital Signs: Temp: 98.4 F (36.9 C) (01/04 0430) Temp Source: Oral (01/04 0430) BP: 148/79 (01/04 0430) Pulse Rate: 72 (01/04 0430)  Labs: Recent Labs    10/25/18 1521  10/26/18 0233  10/26/18 0252 10/26/18 1152 10/27/18 0832 10/28/18 0656  HGB  --    < > 9.3*  --   --   --  9.6* 9.6*  HCT  --   --  30.6*  --   --   --  31.4* 31.4*  PLT  --   --  362  --   --   --  377 388  APTT 136*  --   --   --  93*  --   --   --   HEPARINUNFRC  --   --   --    < >  --  0.56 0.52 0.32  CREATININE  --   --  5.78*  --   --   --  5.34* 5.28*   < > = values in this interval not displayed.    Estimated Creatinine Clearance: 11.9 mL/min (A) (by C-G formula based on SCr of 5.28 mg/dL (H)).   Assessment: 78 y.o female morbidly obese w/ PMH DVT/PE on Eliquis, presents to ED 12/28 with AMS, hematuria and abdominal discomfort, h/o endometrial cancer.  On Eliquis PTA, last dose taken pta was on 12/27 at 19:00.   Found to be in acute renal failure. Pharmacy consulted to start IV heparin while eliquis on hold due to acute renal failure.   Heparin level remains therapeutic at 0.32, H/H low but stable, plts wnl.  Goal of Therapy:  Heparin level 0.3-0.7 units/mL Monitor platelets by anticoagulation protocol: Yes   Plan:  Continue heparin gtt at 1250 units/hr Daily heparin level, CBC, s/s bleeding  Jackson Latino, PharmD PGY1 Pharmacy Resident Phone [phone] 10/28/2018      9:05 AM

## 2018-10-28 NOTE — Discharge Summary (Signed)
PROGRESS NOTE        PATIENT DETAILS Name: Theresa Blake Age: 78 y.o. Sex: female Date of Birth: 04-Jan-1941 Admit Date: 10/21/2018 Admitting Physician Courage Denton Brick, MD JJK:[id number], Cherlyn Labella, MD  Brief Narrative: Patient is a 78 y.o. female with history of morbid obesity, DVT/PE on anticoagulation, CKD stage III anterior cancer, chronic hypoxic respiratory failure on home O2 presented with acute metabolic encephalopathy in the setting of acute kidney injury.  She was found to have bilateral hydronephrosis-and underwent bilateral ureteral stent placement by urology.  Renal function slowly improving with supportive care.  See below for further details  Subjective: Still somewhat confused.Wants to see her sister. Follows some commands  Assessment/Plan: Acute kidney injury: Thought to be secondary to obstructive uropathy from suspected recurrent endometrial cancer causing bilateral hydronephrosis-she is now status post ureteral stent placement by urology.  Renal function slowly improving-agree with nephrology-patient is not a great long-term candidate for HD.  Spoke with patient's son over the phone [name] Ormond)-he claims that he is the HCPOA-understands poor overall prognosis-and that patient would not be a good long-term candidate for HD if renal function were to worsen in the future.  Continue supportive care.  Acute metabolic encephalopathy: Likely secondary to above-CT head negative on admission.  Will somewhat confused-but is able to follow some commands occasionally.  Hopefully confusion will resolve with improving renal function.  Hyperkalemia: Resolved, likely secondary to AKI.  Acute metabolic acidosis: Improving, secondary to AKI.    PNA: Afebrile-mild leukocytosis persists-has completed 7 days of Rocephin/Zithromax-hence we will stop.    Hypothyroidism: Continue Synthroid  Dyslipidemia: Continue Vytorin  Hypertension: Controlled-continue  metoprolol.  GERD: Continue PPI  History of venous thromboembolism: Previously on Eliquis-currently on heparin infusion.  If renal function does not improve-we will need to be placed on Coumadin on discharge.  History of endometrial cancer with probable recurrence causing bilateral hydronephrosis and possible pulmonary metastases: Per family (sister at bedside on 1/3 and son over the phone on 1/4) patient was lost to follow-up with oncology-this was by choice.  Morbid obesity  Chronic hypoxic respiratory failure on home O2 3 L  Deconditioning/frailty: Not sure what her usual baseline is-but she appears incredibly weak-PT evaluation completed-SNF on discharge-however-family may consider home with hospice services.  Palliative care: Spoke with patient's son Nylee Barbuto over the phone this morning-Long discussion-he is aware of poor overall prognosis.  I had spoken with the patient's sister over at bedside following yesterday afternoon.  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  Per patient's sister-patient probably would not want aggressive care-and probably would not want to pursue treatment of underlying recurrent cancer.  I spoke with the patient's son today-he reconfirmed above-and that family was open to taking patient home with hospice-rather than going to SNF (bad experience in the past).  Have consulted palliative care-will await further recommendations.  Per patient's son-he is agreeable to DNR status.  DVT Prophylaxis: Full dose anticoagulation with Heparin  Code Status: DNR  Family Communication: Cecel Lubin over the phone  Disposition Plan: Remain inpatient-SNF on discharge-requires several more days of hospitalization.  Antimicrobial agents: Anti-infectives (From admission, onward)   Start     Dose/Rate Route Frequency Ordered Stop   10/22/18 0600   cefTRIAXone (ROCEPHIN) 1 g  in sodium chloride 0.9 % 100 mL IVPB     1 g 200 mL/hr over 30 Minutes Intravenous Every 24 hours 10/21/18 1438     10/22/18 0600  azithromycin (ZITHROMAX) 500 mg in sodium chloride 0.9 % 250 mL IVPB     500 mg 250 mL/hr over 60 Minutes Intravenous Every 24 hours 10/21/18 1438     10/21/18 0445  cefTRIAXone (ROCEPHIN) 1 g in sodium chloride 0.9 % 100 mL IVPB     1 g 200 mL/hr over 30 Minutes Intravenous  Once 10/21/18 0435 10/21/18 0540   10/21/18 0445  azithromycin (ZITHROMAX) 500 mg in sodium chloride 0.9 % 250 mL IVPB     500 mg 250 mL/hr over 60 Minutes Intravenous  Once 10/21/18 0435 10/21/18 3382      Procedures: 12/28>>Cystoscopy, bilateral retrograde ureteral pyelograms with fluoroscopic interpretation, placement of bilateral double-J stents   CONSULTS:  urology  Time spent: 25- minutes-Greater than 50% of this time was spent in counseling, explanation of diagnosis, planning of further management, and coordination of care.  MEDICATIONS: Scheduled Meds: . aspirin  81 mg Oral Daily  . clonazePAM  0.5 mg Oral BID  . ezetimibe-simvastatin  1 tablet Oral QHS  . fluticasone  2 spray Each Nare Daily  . furosemide  40 mg Oral Daily  . levothyroxine  100 mcg Oral QAC breakfast  . metoprolol tartrate  50 mg Oral BID  . multivitamin with minerals  1 tablet Oral Daily  . mupirocin ointment  1 application Nasal BID  . pantoprazole  40 mg Oral Daily  . sodium bicarbonate  1,300 mg Oral TID  . venlafaxine XR  150 mg Oral Daily   Continuous Infusions: . sodium chloride 50 mL/hr at 10/27/18 2146  . albuterol 10 mg/hr (10/21/18 0653)  . azithromycin Stopped (10/28/18 5053)  . cefTRIAXone (ROCEPHIN)  IV Stopped (10/28/18 0544)  . heparin 1,250 Units/hr (10/28/18 1226)   PRN Meds:.acetaminophen **OR** [DISCONTINUED] acetaminophen, albuterol, [DISCONTINUED] ondansetron **OR** ondansetron (ZOFRAN) IV, polyethylene glycol, traZODone   PHYSICAL EXAM: Vital  signs: Vitals:   10/27/18 1646 10/27/18 2121 10/28/18 0430 10/28/18 0922  BP: (!) 162/72 (!) 155/76 (!) 148/79 (!) 163/81  Pulse: 79 80 72 79  Resp: 20 18 18 18   Temp: 98 F (36.7 C) 98.6 F (37 C) 98.4 F (36.9 C) 98.3 F (36.8 C)  TempSrc: Oral Oral Oral Oral  SpO2: 94% 92% 96% 100%  Weight:  122 kg    Height:       Filed Weights   10/25/18 0355 10/25/18 2100 10/27/18 2121  Weight: 121 kg 121 kg 122 kg   Body mass index is 44.76 kg/m.   General appearance:Awake, remains confused Eyes:no scleral icterus. HEENT: Atraumatic and Normocephalic Neck: supple, no JVD. Resp:Good air entry bilaterally CVS: S1 S2 regular GI: Bowel sounds present, Non tender and not distended with no gaurding, rigidity or rebound. Extremities: B/L Lower Ext shows ++ edema, both legs are warm to touch Neurology: Difficult exam-but seems to be more nonfocal-moving all 4 extremities Musculoskeletal:No digital cyanosis Skin:No Rash, warm and dry Wounds:N/A  I have personally reviewed following labs and imaging studies  LABORATORY DATA: CBC: Recent Labs  Lab 10/24/18 0758 10/25/18 0541 10/26/18 0233 10/27/18 0832 10/28/18 0656  WBC 11.0* 11.6* 12.9* 14.4* 12.9*  HGB 9.5* 9.3* 9.3* 9.6* 9.6*  HCT 31.1* 29.4* 30.6* 31.4* 31.4*  MCV 90.9 90.2 90.3 91.8 91.3  PLT 341 371 362 377 976    Basic Metabolic Panel:  Recent Labs  Lab 10/24/18 0758 10/25/18 0541 10/26/18 0233 10/27/18 0832 10/28/18 0656  NA 139 140 140 141 142  K 4.2 3.9 4.1 3.7 3.4*  CL 111 110 111 113* 111  CO2 16* 19* 17* 16* 20*  GLUCOSE 100* 102* 101* 108* 94  BUN 63* 61* 62* 56* 54*  CREATININE 6.12* 5.71* 5.78* 5.34* 5.28*  CALCIUM 9.1 9.4 9.3 9.8 9.9  MG  --   --  1.2* 1.5*  --     GFR: Estimated Creatinine Clearance: 11.9 mL/min (A) (by C-G formula based on SCr of 5.28 mg/dL (H)).  Liver Function Tests: No results for input(s): AST, ALT, ALKPHOS, BILITOT, PROT, ALBUMIN in the last 168 hours. No results for  input(s): LIPASE, AMYLASE in the last 168 hours. No results for input(s): AMMONIA in the last 168 hours.  Coagulation Profile: No results for input(s): INR, PROTIME in the last 168 hours.  Cardiac Enzymes: No results for input(s): CKTOTAL, CKMB, CKMBINDEX, TROPONINI in the last 168 hours.  BNP (last 3 results) No results for input(s): PROBNP in the last 8760 hours.  HbA1C: No results for input(s): HGBA1C in the last 72 hours.  CBG: Recent Labs  Lab 10/27/18 1137 10/27/18 1647 10/28/18 0735 10/28/18 0818 10/28/18 1120  GLUCAP 90 80 57* 99 120*    Lipid Profile: No results for input(s): CHOL, HDL, LDLCALC, TRIG, CHOLHDL, LDLDIRECT in the last 72 hours.  Thyroid Function Tests: No results for input(s): TSH, T4TOTAL, FREET4, T3FREE, THYROIDAB in the last 72 hours.  Anemia Panel: No results for input(s): VITAMINB12, FOLATE, FERRITIN, TIBC, IRON, RETICCTPCT in the last 72 hours.  Urine analysis:    Component Value Date/Time   COLORURINE YELLOW 10/21/2018 0551   APPEARANCEUR CLEAR 10/21/2018 0551   LABSPEC 1.011 10/21/2018 0551   PHURINE 6.0 10/21/2018 0551   GLUCOSEU NEGATIVE 10/21/2018 0551   HGBUR MODERATE (A) 10/21/2018 0551   HGBUR negative 02/10/2010 1135   BILIRUBINUR NEGATIVE 10/21/2018 0551   BILIRUBINUR neg 06/11/2013 1510   KETONESUR NEGATIVE 10/21/2018 0551   PROTEINUR 30 (A) 10/21/2018 0551   UROBILINOGEN 0.2 06/11/2013 1510   UROBILINOGEN 1.0 04/08/2012 1622   NITRITE NEGATIVE 10/21/2018 0551   LEUKOCYTESUR NEGATIVE 10/21/2018 0551    Sepsis Labs: Lactic Acid, Venous    Component Value Date/Time   LATICACIDVEN 2.9 (HH) 10/21/2018 1724    MICROBIOLOGY: Recent Results (from the past 240 hour(s))  Blood Culture (routine x 2)     Status: None   Collection Time: 10/21/18  3:15 AM  Result Value Ref Range Status   Specimen Description BLOOD RIGHT ARM  Final   Special Requests   Final    BOTTLES DRAWN AEROBIC AND ANAEROBIC Blood Culture adequate  volume   Culture  Setup Time NO ORGANISMS SEEN  Final   Culture   Final    NO GROWTH 5 DAYS Performed at [facility], [address] [place], [place] [address]    Report Status 10/26/2018 FINAL  Final  Blood Culture (routine x 2)     Status: None   Collection Time: 10/21/18  3:30 AM  Result Value Ref Range Status   Specimen Description BLOOD RIGHT HAND  Final   Special Requests   Final    BOTTLES DRAWN AEROBIC ONLY Blood Culture adequate volume   Culture   Final    NO GROWTH 5 DAYS Performed at [facility], [place] [address], [place],  [address]    Report Status 10/26/2018 FINAL  Final  Urine culture     Status: None   Collection Time: 10/21/18  5:51 AM  Result Value Ref Range Status   Specimen Description URINE, CATHETERIZED  Final   Special Requests NONE  Final   Culture   Final    NO GROWTH Performed at [facility], [address], [place], [place] [address]    Report Status 10/22/2018 FINAL  Final  MRSA PCR Screening     Status: Abnormal   Collection Time: 10/22/18  8:08 AM  Result Value Ref Range Status   MRSA by PCR POSITIVE (A) NEGATIVE Final    Comment:        The GeneXpert MRSA Assay (FDA approved for NASAL specimens only), is one component of a comprehensive MRSA colonization surveillance program. It is not intended to diagnose MRSA infection nor to guide or monitor treatment for MRSA infections. CRITICAL RESULT CALLED TO, READ BACK BY AND VERIFIED WITH: L. BENNET, RN AT 0945 ON 10/22/18 BY C. JESSUP, MLT. Performed at [facility], [place] [address] [place], [place] [address]     RADIOLOGY STUDIES/RESULTS: Ct Abdomen Pelvis Wo Contrast  Result Date: 10/21/2018 CLINICAL DATA:  Abdominal pain and confusion. EXAM: CT ABDOMEN AND PELVIS WITHOUT CONTRAST TECHNIQUE: Multidetector CT imaging of the abdomen and pelvis was performed following the standard protocol without IV contrast. COMPARISON:  CT abdomen pelvis 11/18/2017  FINDINGS: LOWER CHEST: Multiple bibasilar pulmonary nodules, measuring up to 10 mm. HEPATOBILIARY: The hepatic contours and density are normal. There is no intra- or extrahepatic biliary dilatation. The gallbladder is normal. PANCREAS: The pancreatic parenchymal contours are normal and there is no ductal dilatation. There is no peripancreatic fluid collection. SPLEEN: Normal. ADRENALS/URINARY TRACT: --Adrenal glands: Normal. --Right kidney/ureter: There is moderate hydroureteronephrosis. No nephroureterolithiasis or other clear source of obstruction. --Left kidney/ureter: There is moderate hydroureteronephrosis. No nephroureterolithiasis or other clear source of obstruction. --Urinary bladder: Urinary bladder is decompressed. STOMACH/BOWEL: --Stomach/Duodenum: There is no hiatal hernia or other gastric abnormality. The duodenal course and caliber are normal. --Small bowel: No dilatation or inflammation. --Colon: No focal abnormality. --Appendix: Not visualized. No right lower quadrant inflammation or free fluid. VASCULAR/LYMPHATIC: Atherosclerotic calcification is present within the non-aneurysmal abdominal aorta, without hemodynamically significant stenosis. No abdominal or pelvic lymphadenopathy. REPRODUCTIVE: There is a T-shaped contraceptive device within the uterus. Area of hypoattenuation at the anterior uterine fundus appears unchanged, though poorly characterized without IV contrast MUSCULOSKELETAL. Multilevel degenerative disc disease and facet arthrosis. No bony spinal canal stenosis. OTHER: None. IMPRESSION: 1. Moderate bilateral hydroureteronephrosis. No nephroureterolithiasis or other clear source of obstruction. 2. Multiple bibasilar pulmonary nodules, measuring up to 10 mm, new since 11/18/17. These are concerning for metastatic disease. 3. Limited assessment of the uterine parenchyma without IV contrast material. Generally unchanged appearance of anterior fundal region of hypoattenuation. Aortic  Atherosclerosis (ICD10-I70.0). Electronically Signed   By: Ulyses Jarred M.D.   On: 10/21/2018 06:02   Dg Chest 2 View  Result Date: 10/26/2018 CLINICAL DATA:  Community acquired pneumonia. EXAM: CHEST - 2 VIEW COMPARISON:  Radiograph October 23, 2018. FINDINGS: Stable cardiomegaly. No pneumothorax is noted. No significant pleural effusion is noted. Increased left upper lobe airspace opacity is noted concerning for worsening pneumonia. There also appears to be developing right upper lobe opacity which may represent pneumonia. Bony thorax is unremarkable. IMPRESSION: Increased bilateral upper lobe opacities are noted concerning for possible pneumonia. Continued radiographic follow-up is recommended. Electronically Signed   By: Marijo Conception, M.D.  On: 10/26/2018 10:18   Dg Chest 2 View  Result Date: 10/21/2018 CLINICAL DATA:  Acute onset of altered mental status. Confusion. EXAM: CHEST - 2 VIEW COMPARISON:  Chest radiograph performed 07/06/2018, and CTA of the chest performed 07/03/2018 FINDINGS: The lungs are well-aerated. Patchy bilateral airspace opacification is similar in appearance to prior studies and may reflect recurrent pulmonary edema or possibly pneumonia. A small left pleural effusion is again noted. No pneumothorax is seen. The cardiomediastinal silhouette is borderline normal in size. There appears to be a mildly displaced fracture of the left lateral fifth rib. IMPRESSION: 1. Patchy bilateral airspace opacification is similar in appearance to prior studies and may reflect recurrent pulmonary edema or possibly pneumonia. Small left pleural effusion again noted. 2. Apparent mildly displaced fracture of the left lateral fifth rib. Electronically Signed   By: Garald Balding M.D.   On: 10/21/2018 04:24   Ct Head Wo Contrast  Result Date: 10/21/2018 CLINICAL DATA:  Acute onset of worsening confusion. Hematuria. EXAM: CT HEAD WITHOUT CONTRAST TECHNIQUE: Contiguous axial images were obtained  from the base of the skull through the vertex without intravenous contrast. COMPARISON:  CT of the head performed 04/24/2018 FINDINGS: Brain: No evidence of acute infarction, hemorrhage, hydrocephalus, extra-axial collection or mass lesion / mass effect. Prominence of the ventricles and sulci reflects mild cortical volume loss. Diffuse periventricular and subcortical white matter change likely reflects small vessel ischemic microangiopathy. Chronic ischemic change is noted at the basal ganglia bilaterally. Mild cerebellar atrophy is noted. The brainstem and fourth ventricle are within normal limits. The basal ganglia are unremarkable in appearance. The cerebral hemispheres demonstrate grossly normal gray-white differentiation. No mass effect or midline shift is seen. Vascular: No hyperdense vessel or unexpected calcification. Skull: There is no evidence of fracture; visualized osseous structures are unremarkable in appearance. Sinuses/Orbits: The orbits are within normal limits. The paranasal sinuses and mastoid air cells are well-aerated. Other: No significant soft tissue abnormalities are seen. IMPRESSION: 1. No acute intracranial pathology seen on CT. 2. Mild cortical volume loss and diffuse small vessel ischemic microangiopathy. 3. Chronic ischemic change at the basal ganglia bilaterally. Electronically Signed   By: Garald Balding M.D.   On: 10/21/2018 05:55   Dg Chest Port 1 View  Result Date: 10/23/2018 CLINICAL DATA:  Shortness of breath. EXAM: PORTABLE CHEST 1 VIEW COMPARISON:  Radiographs of October 21, 2018. FINDINGS: Stable cardiomediastinal silhouette. No pneumothorax or pleural effusion is noted. Right lung is clear. Patchy opacity is noted in left midlung laterally concerning for possible pneumonia. Bony thorax is unremarkable. IMPRESSION: Probable left midlung pneumonia. Followup PA and lateral chest X-ray is recommended in 3-4 weeks following trial of antibiotic therapy to ensure resolution and  exclude underlying malignancy. Electronically Signed   By: Marijo Conception, M.D.   On: 10/23/2018 07:54   Dg C-arm 1-60 Min-no Report  Result Date: 10/21/2018 Fluoroscopy was utilized by the requesting physician.  No radiographic interpretation.     LOS: 7 days   Oren Binet, MD  [facility]  If 7PM-7AM, please contact night-coverage  Please page via [email or web address]-Password [email or web address]-click on MD name and type text message  10/28/2018, 12:34 PM

## 2018-10-28 NOTE — Progress Notes (Signed)
[facility] Progress Note  Name: Theresa Blake MRN: [id number] DOB: June 16, 1941  Chief Complaint:  AMS, hematuria  Subjective:   Pt with 2.4 liters UOP over 1/3, net even Started lasix 40 daily yesterday. No new issues. She tells me she just wants to go home; SNF bed being pursued  Review of systems:  Unable to reliably obtain 2/2 AMS, which is overall improving  She denies shortness of breath, CP or n/v Urinating via foley  --------------- Background on consult:  Theresa Blake is a 78 y.o. female with a history of HTN and DM who presented with confusion and hematuria.  She is altered and not able to provide any history.  I spoke with her son Theresa Blake (at [phone]) and he provided the history below.  He reports that she has been confused and "panicky" recently.  There has been blood in her urine and sediment for the past few weeks.  She has not been eating for the past 6 days but reports drinking some water.  Her son states that a week and a half ago she was "normal".  Per her son, she had uterine cancer and was treated 4-5 years ago at [facility] with radiation for the same.  Work-up is notable for UA with 6-10 RBC and 30 mg/dl protein.  CT a/p demonstrated moderate bilateral hydro without stone or clear source of obstruction.  CT also incidentally noted multiple bilateral pulmonary nodules which were characterized as concerning for metastatic disease.  Spoke with primary team and urology in ER and urology is planning for stents later today  Intake/Output Summary (Last 24 hours) at 10/28/2018 0916 Last data filed at 10/28/2018 0658 Gross per 24 hour  Intake 2364.43 ml  Output 2400 ml  Net -35.57 ml    Vitals:  Vitals:   10/27/18 0939 10/27/18 1646 10/27/18 2121 10/28/18 0430  BP: (!) 124/51 (!) 162/72 (!) 155/76 (!) 148/79  Pulse: 82 79 80 72  Resp: 19 20 18 18   Temp: 97.7 F (36.5 C) 98 F (36.7 C) 98.6 F (37 C) 98.4 F (36.9 C)  TempSrc: Oral Oral Oral Oral  SpO2:  97% 94% 92% 96%  Weight:   122 kg   Height:         Physical Exam:   General elderly female in bed in no acute distress HEENT MM tacky as usual, EOMI Neck supple trachea midline Lungs clear anteriorly and unlabored at rest Heart regular rate and rhythm no rubs or gallops appreciated Abdomen soft nontender nondistended Extremities no pitting edema appreciated  Psych normal mood and affect Neuro oriented to self, hospital; frequently pulling at lines and picking at things GU - foley in place   Medications reviewed   Labs:  BMP Latest Ref Rng & Units 10/28/2018 10/27/2018 10/26/2018  Glucose 70 - 99 mg/dL 94 108(H) 101(H)  BUN 8 - 23 mg/dL 54(H) 56(H) 62(H)  Creatinine 0.44 - 1.00 mg/dL 5.28(H) 5.34(H) 5.78(H)  Sodium 135 - 145 mmol/L 142 141 140  Potassium 3.5 - 5.1 mmol/L 3.4(L) 3.7 4.1  Chloride 98 - 111 mmol/L 111 113(H) 111  CO2 22 - 32 mmol/L 20(L) 16(L) 17(L)  Calcium 8.9 - 10.3 mg/dL 9.9 9.8 9.3     Assessment/Plan:   # AKI on CKD 3 secondary to DM, HTN with BL ~1.5 - AKI secondary to obstruction from suspected recurrent endometrial Ca now s/p bilateral ureteral stents  - She is improving with supportive care with creatinine down from peak 9/4 on  12/28 to 5.28 today.  My hope is that she continues to improve but I doubt she will get back to her previous baseline.  With her multiple comorbidities and poor functional status she would be a suboptimal dialysis candidate.  It is not currently indicated anyway.  - Started lasix 40 po daily 1/3 to maintain euvolemia.  Can watch while she's still inpatient to make sure this dose will be ok - Continue foley catheter per urology   # Obstructive uropathy - s/p stenting and with foley in place (12/18 Dr. Diona Fanti) - Concern for recurrence of uterine cancer with metastatic disease  # Metabolic acidosis  - Secondary to AKI as well as note metformin use in setting of AKI  - Continue oral sodium bicarbonate and supportive care for AKI   - serum bicarb today 20 on 1300 TID  # HTN  - Fair control on metoprolol 50 BID  # AMS/encephalopathy: delerium secondary to acute illness - Supportive care - Family (son) is making decisions at this time  # Pulmonary nodules, possible PNA - completed rocephic and azithromycin per primary  - Concerning for metastatic disease per CT  - Per primary team   # DM  - Should remain off metformin   #h/o PE: was on eliquis but has been switched to heparin gtt in light of acute issues.  Primary may start coumadin.      Justin Mend, MD 10/28/2018 9:16 AM

## 2018-10-28 NOTE — Consult Note (Signed)
Consultation Note Date: 10/28/2018   Patient Name: Theresa Blake  DOB: [birth year]  MRN: [id number]  Age / Sex: 78 y.o., female  PCP: Tracie Harrier, MD Referring Physician: Jonetta Osgood, MD  Reason for Consultation: Establishing goals of care, Hospice Evaluation and Psychosocial/spiritual support  HPI/Patient Profile: 78 y.o. female  with past medical history of chronic kidney disease stage III, hypothyroidism, hypertension, COPD on home oxygen (hypoventilation syndrome), hyperlipidemia, depression with panic disorder, diabetes, endometrial cancer with radiation treatment (patient was not a surgical candidate) admitted on 10/21/2018 with altered mental status.  Her creatinine on admission was 9.39 (baseline 1.5).  Patient was found to have bilateral hydronephrosis and underwent bilateral stenting.  Nephrology is following for renal improvement.  She has shown slow improvement, creatinine on 10/28/2018 5.28, but per nephrology notes indicate unlikely to return to patient's prior baseline given underlying malignancy. She is not a HD candidate.    Patient underwent CT scan and now has multiple bilateral pulmonary nodules suspicious for metastatic disease.  This is new since January 2019.  Her new hydronephrosis is suspected to be related to worsening underlying cancer.  Patient has been referred to oncology in January 2019 but was lost to follow-up by choice.   Consult ordered for goals of care.  Palliative medicine provider saw patient in 2018.   Clinical Assessment and Goals of Care: Patient seen, chart reviewed.  Patient is altered and only oriented to self.  I did speak to her son, Jacalyn Biggs via phone.  [name] shares that he spoke to attending, Dr. Sloan Leiter, this morning, and confirms DNR.  He also is verbalizing understanding of how ill his mother is and is desirous of hospice support in the home.  He  states his mother is been bedbound now for 2 years and has very poor quality of life  Patient is unable to speak for herself at this point.  Son, Tamyra Fojtik states he is healthcare power of attorney.  She does have 4 sons.      SUMMARY OF RECOMMENDATIONS   DNR/DNI Consult placed to care management for hospice at home referral Plan to meet son, Stina Gane, on 10/29/2018 at 37 AM for further conversation Code Status/Advance Care Planning:  DNR    Symptom Management:   Depression with anxiety: Continue Effexor X are 150 mg daily; Klonopin 0.5 twice daily  Insomnia: Continue with trazodone 50 mg nightly as needed  Delirium: Recommend scheduling Zyprexa 5 mg nightly unless contraindicated by nephrology, or prolonged QT interval  Palliative Prophylaxis:   Aspiration, Bowel Regimen, Delirium Protocol, Eye Care, Frequent Pain Assessment, Oral Care and Turn Reposition  Additional Recommendations (Limitations, Scope, Preferences):  Minimize Medications, No Artificial Feeding, No Chemotherapy, No Hemodialysis, No Radiation, No Surgical Procedures and No Tracheostomy  Psycho-social/Spiritual:   Desire for further Chaplaincy support:no  Additional Recommendations: Referral to Community Resources   Prognosis:   < 6 months in the setting of recurrent endometrial cancer with new metastatic disease to lung as well as worsening abdominal burden, bilateral  hydronephrosis, acute on chronic kidney disease  Discharge Planning: Home with Hospice      Primary Diagnoses: Present on Admission: . Acute renal failure superimposed on stage 3 chronic kidney disease ([place]) . AKI (acute kidney injury) ([place]) . Hypothyroidism . Diabetes mellitus type 2 in obese ([place]) . Morbid obesity due to excess calories ([place]) . Essential hypertension . Endometrial cancer ([place]) . Bil Hydronephrosis--- No stones . Toxic metabolic encephalopathy . Pulmonary metastases (H/o endometrial carcinoma)   I have  reviewed the medical record, interviewed the patient and family, and examined the patient. The following aspects are pertinent.  Past Medical History:  Diagnosis Date  . Abnormal Pap smear of cervix   . Allergic rhinitis, cause unspecified   . Backache, unspecified   . Depression    mood disorder; ? bipolar  . Dermatomycosis, unspecified   . Diabetes mellitus type II   . Edema   . Foot fracture, right   . HLD (hyperlipidemia)   . HTN (hypertension)   . Hx pulmonary embolism    multiple  . Lumbar spinal stenosis   . Malignant neoplasm of corpus uteri, except isthmus ([place])   . Morbid obesity ([place])   . Other specified disease of hair and hair follicles   . Pain in joint, pelvic region and thigh   . Panic disorder without agoraphobia    Social History   Socioeconomic History  . Marital status: Widowed    Spouse name: Not on file  . Number of children: 4  . Years of education: Not on file  . Highest education level: Not on file  Occupational History  . Occupation: Disabled  Social Needs  . Financial resource strain: Not on file  . Food insecurity:    Worry: Not on file    Inability: Not on file  . Transportation needs:    Medical: Not on file    Non-medical: Not on file  Tobacco Use  . Smoking status: Never Smoker  . Smokeless tobacco: Never Used  Substance and Sexual Activity  . Alcohol use: No  . Drug use: No  . Sexual activity: Not on file  Lifestyle  . Physical activity:    Days per week: Not on file    Minutes per session: Not on file  . Stress: Not on file  Relationships  . Social connections:    Talks on phone: Not on file    Gets together: Not on file    Attends religious service: Not on file    Active member of club or organization: Not on file    Attends meetings of clubs or organizations: Not on file    Relationship status: Not on file  Other Topics Concern  . Not on file  Social History Narrative   Widow-husband died with MI, DM      4 sons       Disability      Compulsive overeater            Family History  Problem Relation Age of Onset  . Heart attack Father 43  . Pancreatic cancer Mother        with mets  . Hyperlipidemia Son   . Hyperlipidemia Son   . Gout Son    Scheduled Meds: . clonazePAM  0.5 mg Oral BID  . ezetimibe-simvastatin  1 tablet Oral QHS  . fluticasone  2 spray Each Nare Daily  . furosemide  40 mg Oral Daily  . levothyroxine  100 mcg Oral QAC breakfast  .  metoprolol tartrate  50 mg Oral BID  . multivitamin with minerals  1 tablet Oral Daily  . mupirocin ointment  1 application Nasal BID  . pantoprazole  40 mg Oral Daily  . sodium bicarbonate  1,300 mg Oral TID  . venlafaxine XR  150 mg Oral Daily   Continuous Infusions: . sodium chloride 50 mL/hr at 10/27/18 2146  . albuterol 10 mg/hr (10/21/18 0653)  . heparin 1,250 Units/hr (10/28/18 1226)   PRN Meds:.acetaminophen **OR** [DISCONTINUED] acetaminophen, albuterol, [DISCONTINUED] ondansetron **OR** ondansetron (ZOFRAN) IV, polyethylene glycol, traZODone Medications Prior to Admission:  Prior to Admission medications   Medication Sig Start Date End Date Taking? Authorizing Provider  acetaminophen (TYLENOL) 325 MG tablet Take 650 mg by mouth every 6 (six) hours as needed for mild pain or headache.   Yes [provider]  aspirin EC 81 MG EC tablet Take 1 tablet (81 mg total) by mouth daily. 07/09/18  Yes Lavina Hamman, MD  buPROPion (WELLBUTRIN XL) 150 MG 24 hr tablet Take 300 mg by mouth daily.    Yes [provider]  clonazePAM (KLONOPIN) 1 MG tablet Take 1 mg by mouth 2 (two) times daily. 06/14/18  Yes [provider]  diclofenac sodium (VOLTAREN) 1 % GEL Apply 2 g topically every 8 (eight) hours as needed (lower back pain).  05/30/18  Yes [provider]  docusate sodium (COLACE) 100 MG capsule Take 100 capsules by mouth daily as needed for mild constipation.    Yes [provider]  ELIQUIS 2.5 MG  TABS tablet Take 2.5 mg by mouth 2 (two) times daily. 05/24/18  Yes [provider]  ezetimibe-simvastatin (VYTORIN) 10-20 MG tablet Take 1 tablet by mouth at bedtime.    Yes [provider]  fluticasone (FLONASE) 50 MCG/ACT nasal spray Place 2 sprays into both nostrils daily.   Yes [provider]  levothyroxine (SYNTHROID, LEVOTHROID) 100 MCG tablet Take 1 tablet (100 mcg total) by mouth daily before breakfast. 07/09/18  Yes Lavina Hamman, MD  metFORMIN (GLUCOPHAGE) 500 MG tablet Take 500 mg by mouth daily. 05/26/18  Yes [provider]  Multiple Vitamin (MULTIVITAMIN WITH MINERALS) TABS tablet Take 1 tablet by mouth daily.   Yes [provider]  omeprazole (PRILOSEC) 20 MG capsule TAKE ONE CAPSULE BY MOUTH EVERY DAY *NEEDS OFFICE VISIT* Patient taking differently: Take 20 mg by mouth daily.    Yes Einar Pheasant, MD  Venlafaxine HCl 225 MG TB24 Take 225 mg by mouth daily.    Yes [provider]  furosemide (LASIX) 40 MG tablet Take 1 tablet (40 mg total) by mouth as directed. 1 tablet Twice a day for 4 days, from 07/12/2018 take one tablet daily. 07/08/18   Lavina Hamman, MD   Allergies  Allergen Reactions  . Anesthetics, Halogenated Shortness Of Breath    10/22/18 Spoke to patients son.  Shortness of breath occurred with Ether when patient was a child.    [place] [Levofloxacin In D5w] Itching  . Lamotrigine Itching  . Tramadol Rash   Review of Systems  Unable to perform ROS: Acuity of condition    Physical Exam Vitals signs and nursing note reviewed.  Constitutional:      Comments: Acutely ill-appearing elderly female; she is confused, agitated  HENT:     Head: Normocephalic and atraumatic.  Neck:     Musculoskeletal: Normal range of motion.  Cardiovascular:     Rate and Rhythm: Normal rate.  Pulmonary:  Effort: Pulmonary effort is normal.  Genitourinary:    Comments: Foley Skin:    General: Skin is warm and dry.      Coloration: Skin is pale.  Neurological:     Comments: Oriented only to self  Psychiatric:     Comments: Psychomotor restlessness observed otherwise unable to test     Vital Signs: BP (!) 163/81 (BP Location: Left Arm)   Pulse 79   Temp 98.3 F (36.8 C) (Oral)   Resp 18   Ht 5\' 5"  (1.651 m)   Wt 122 kg   SpO2 100%   BMI 44.76 kg/m  Pain Scale: 0-10 POSS *See Group Information*: S-Acceptable,Sleep, easy to arouse Pain Score: 0-No pain   SpO2: SpO2: 100 % O2 Device:SpO2: 100 % O2 Flow Rate: .O2 Flow Rate (L/min): 2 L/min  IO: Intake/output summary:   Intake/Output Summary (Last 24 hours) at 10/28/2018 1534 Last data filed at 10/28/2018 1348 Gross per 24 hour  Intake 1647.24 ml  Output 2500 ml  Net -852.76 ml    LBM: Last BM Date: 10/27/18 Baseline Weight: Weight: 120 kg Most recent weight: Weight: 122 kg     Palliative Assessment/Data:   Flowsheet Rows     Most Recent Value  Intake Tab  Referral Department  Hospitalist  Unit at Time of Referral  Med/Surg Unit  Palliative Care Primary Diagnosis  Nephrology  Date Notified  10/27/18  Palliative Care Type  Return patient Palliative Care  Date of Admission  10/21/18  Date first seen by Palliative Care  10/28/18  # of days Palliative referral response time  1 Day(s)  # of days IP prior to Palliative referral  6  Clinical Assessment  Palliative Performance Scale Score  30%  Pain Max last 24 hours  Not able to report  Pain Min Last 24 hours  Not able to report  Dyspnea Max Last 24 Hours  Not able to report  Dyspnea Min Last 24 hours  Not able to report  Nausea Max Last 24 Hours  Not able to report  Nausea Min Last 24 Hours  Not able to report  Anxiety Max Last 24 Hours  Not able to report  Anxiety Min Last 24 Hours  Not able to report  Other Max Last 24 Hours  Not able to report  Psychosocial & Spiritual Assessment  Palliative Care Outcomes  Patient/Family meeting held?  Yes  Who was at the meeting?  son,  [name]  Patient/Family wishes: Interventions discontinued/not started   Mechanical Ventilation, Hemodialysis, PEG, Tube feedings/TPN, Vasopressors, Trach  Palliative Care follow-up planned  Yes, Facility      Time In: 1300 Time Out: 1400 Time Total: 60 min Greater than 50%  of this time was spent counseling and coordinating care related to the above assessment and plan. Staffed with Dr. Sloan Leiter  Signed by: Dory Horn, NP   Please contact Palliative Medicine Team phone at [phone] for questions and concerns.  For individual provider: See Shea Evans

## 2018-10-29 LAB — BASIC METABOLIC PANEL
Anion gap: 10 (ref 5–15)
BUN: 52 mg/dL — ABNORMAL HIGH (ref 8–23)
CO2: 20 mmol/L — AB (ref 22–32)
Calcium: 9.7 mg/dL (ref 8.9–10.3)
Chloride: 114 mmol/L — ABNORMAL HIGH (ref 98–111)
Creatinine, Ser: 5.16 mg/dL — ABNORMAL HIGH (ref 0.44–1.00)
GFR calc Af Amer: 9 mL/min — ABNORMAL LOW (ref >60)
GFR calc non Af Amer: 8 mL/min — ABNORMAL LOW (ref >60)
GLUCOSE: 90 mg/dL (ref 70–99)
Potassium: 3.5 mmol/L (ref 3.5–5.1)
Sodium: 144 mmol/L (ref 135–145)

## 2018-10-29 LAB — GLUCOSE, CAPILLARY
Glucose-Capillary: 82 mg/dL (ref 70–99)
Glucose-Capillary: 105 mg/dL — ABNORMAL HIGH (ref 70–99)
Glucose-Capillary: 112 mg/dL — ABNORMAL HIGH (ref 70–99)

## 2018-10-29 LAB — CBC
HCT: 28.9 % — ABNORMAL LOW (ref 36.0–46.0)
Hemoglobin: 8.9 g/dL — ABNORMAL LOW (ref 12.0–15.0)
MCH: 27.9 pg (ref 26.0–34.0)
MCHC: 30.8 g/dL (ref 30.0–36.0)
MCV: 90.6 fL (ref 80.0–100.0)
PLATELETS: 349 10*3/uL (ref 150–400)
RBC: 3.19 MIL/uL — ABNORMAL LOW (ref 3.87–5.11)
RDW: 19 % — ABNORMAL HIGH (ref 11.5–15.5)
WBC: 11.9 10*3/uL — ABNORMAL HIGH (ref 4.0–10.5)
nRBC: 0 % (ref 0.0–0.2)

## 2018-10-29 LAB — HEPARIN LEVEL (UNFRACTIONATED): Heparin Unfractionated: 0.47 IU/mL (ref 0.30–0.70)

## 2018-10-29 NOTE — Progress Notes (Signed)
PROGRESS NOTE        PATIENT DETAILS Name: Theresa Blake Age: 78 y.o. Sex: female Date of Birth: Jun 25, 1941 Admit Date: 10/21/2018 Admitting Physician Courage Denton Brick, MD [id number], Cherlyn Labella, MD  Brief Narrative: Patient is a 78 y.o. female with history of morbid obesity, DVT/PE on anticoagulation, CKD stage III anterior cancer, chronic hypoxic respiratory failure on home O2 presented with acute metabolic encephalopathy in the setting of acute kidney injury.  She was found to have bilateral hydronephrosis-and underwent bilateral ureteral stent placement by urology.  Renal function slowly improving with supportive care.  See below for further details  Subjective:  In bed mildly confused but in no distress, denies any headache chest pain or abdominal pain.  Assessment/Plan:  Acute kidney injury: Thought to be secondary to obstructive uropathy from likely recurrent endometrial cancer causing bilateral hydronephrosis, she is status post ureteral stent placement bilaterally by urologist Dr. Diona Fanti.  Urology and nephrology following, renal function is gradually improving and now seems to be plateauing, received plenty of IV fluids and now evidence of fluid overload.  Fluid management per nephrology.  Family not interested in HD.  If worsens they are interested in home hospice.   Acute metabolic encephalopathy: Likely secondary to above-CT head negative on admission.  Will somewhat confused-but is able to follow some commands occasionally.  Hopefully confusion will resolve with improving renal function.  Per son some underlying early dementia also suspected at home.  Continue supportive care.  Hyperkalemia: Resolved, likely secondary to AKI.  Acute metabolic acidosis: Improving, secondary to AKI.    PNA: Afebrile-mild leukocytosis persists-has completed 7 days of Rocephin/Zithromax-hence we will stop.    Hypothyroidism: Continue Synthroid  Dyslipidemia:  Continue Vytorin  Hypertension: Controlled-continue metoprolol.  GERD: Continue PPI  History of venous thromboembolism: Previously on Eliquis-currently on heparin infusion.  If renal function does not improve-we will need to be placed on Coumadin on discharge.  History of endometrial cancer with probable recurrence causing bilateral hydronephrosis and possible pulmonary metastases: Per family (sister at bedside on 1/3 and son over the phone on 1/4) patient was lost to follow-up with oncology-this was by choice.  Morbid obesity  Chronic hypoxic respiratory failure on home O2 3 L  Deconditioning/frailty: Not sure what her usual baseline is-but she appears incredibly weak-PT evaluation completed-SNF on discharge-however-family may consider home with hospice services.  Palliative care: Previous MD Dr Regenia Skeeter with patient's son Fergie Sherbert over the phone this morning-Long discussion-he is aware of poor overall prognosis.  I had spoken with the patient's sister over at bedside following yesterday afternoon.  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  Per patient's sister-patient probably would not want aggressive care-and probably would not want to pursue treatment of underlying recurrent cancer.  I spoke with the patient's son today-he reconfirmed above-and that family was open to taking patient home with hospice-rather than going to SNF (bad experience in the past).  Have consulted palliative care-will await further recommendations.  Per patient's son-he is agreeable to DNR status.  DVT Prophylaxis: Full dose anticoagulation with Heparin  Code Status: DNR  Family Communication: Cecel Oelke over the phone  Disposition Plan: Remain inpatient-SNF on discharge-requires several more days of hospitalization.  Antimicrobial agents: Anti-infectives (From admission,  onward)  Start     Dose/Rate Route Frequency Ordered Stop   10/22/18 0600  cefTRIAXone (ROCEPHIN) 1 g in sodium chloride 0.9 % 100 mL IVPB  Status:  Discontinued     1 g 200 mL/hr over 30 Minutes Intravenous Every 24 hours 10/21/18 1438 10/28/18 1304   10/22/18 0600  azithromycin (ZITHROMAX) 500 mg in sodium chloride 0.9 % 250 mL IVPB  Status:  Discontinued     500 mg 250 mL/hr over 60 Minutes Intravenous Every 24 hours 10/21/18 1438 10/28/18 1304   10/21/18 0445  cefTRIAXone (ROCEPHIN) 1 g in sodium chloride 0.9 % 100 mL IVPB     1 g 200 mL/hr over 30 Minutes Intravenous  Once 10/21/18 0435 10/21/18 0540   10/21/18 0445  azithromycin (ZITHROMAX) 500 mg in sodium chloride 0.9 % 250 mL IVPB     500 mg 250 mL/hr over 60 Minutes Intravenous  Once 10/21/18 0435 10/21/18 4696      Procedures: 12/28>>Cystoscopy, bilateral retrograde ureteral pyelograms with fluoroscopic interpretation, placement of bilateral double-J stents   CONSULTS:  urology  Time spent: 25- minutes-Greater than 50% of this time was spent in counseling, explanation of diagnosis, planning of further management, and coordination of care.  MEDICATIONS: Scheduled Meds: . clonazePAM  0.5 mg Oral BID  . ezetimibe-simvastatin  1 tablet Oral QHS  . fluticasone  2 spray Each Nare Daily  . furosemide  40 mg Oral Daily  . levothyroxine  100 mcg Oral QAC breakfast  . metoprolol tartrate  50 mg Oral BID  . multivitamin with minerals  1 tablet Oral Daily  . pantoprazole  40 mg Oral Daily  . sodium bicarbonate  1,300 mg Oral TID  . venlafaxine XR  150 mg Oral Daily   Continuous Infusions: . sodium chloride 50 mL/hr at 10/28/18 2026  . albuterol 10 mg/hr (10/21/18 0653)  . heparin 1,250 Units/hr (10/29/18 0911)   PRN Meds:.acetaminophen **OR** [DISCONTINUED] acetaminophen, albuterol, [DISCONTINUED] ondansetron **OR** ondansetron (ZOFRAN) IV, polyethylene glycol, traZODone   PHYSICAL EXAM: Vital signs: Vitals:    10/28/18 1644 10/28/18 2029 10/29/18 0412 10/29/18 0746  BP: (!) 146/69 (!) 153/79 (!) 145/67 134/64  Pulse: 70 77 71 68  Resp: 20 16 18 18   Temp: 98.3 F (36.8 C) 98.4 F (36.9 C) 98.4 F (36.9 C) 97.9 F (36.6 C)  TempSrc: Oral Oral Oral Axillary  SpO2: 93% 100% 99% 95%  Weight:   103.6 kg   Height:       Filed Weights   10/25/18 2100 10/27/18 2121 10/29/18 0412  Weight: 121 kg 122 kg 103.6 kg   Body mass index is 38.01 kg/m.   Exam  Awake Alert, Oriented X 3, No new F.N deficits, Normal affect [place].AT,PERRAL Supple Neck,No JVD, No cervical lymphadenopathy appriciated.  Symmetrical Chest wall movement, Good air movement bilaterally, CTAB RRR,No Gallops, Rubs or new Murmurs, No Parasternal Heave +ve B.Sounds, Abd Soft, No tenderness, No organomegaly appriciated, No rebound - guarding or rigidity. No Cyanosis, Clubbing, 2+ leg edema, No new Rash or bruise   I have personally reviewed following labs and imaging studies  LABORATORY DATA: CBC: Recent Labs  Lab 10/25/18 0541 10/26/18 0233 10/27/18 0832 10/28/18 0656 10/29/18 0545  WBC 11.6* 12.9* 14.4* 12.9* 11.9*  HGB 9.3* 9.3* 9.6* 9.6* 8.9*  HCT 29.4* 30.6* 31.4* 31.4* 28.9*  MCV 90.2 90.3 91.8 91.3 90.6  PLT 371 362 377 388 295    Basic Metabolic Panel: Recent Labs  Lab 10/25/18 0541 10/26/18 0233 10/27/18 2841  10/28/18 0656 10/29/18 0545  NA 140 140 141 142 144  K 3.9 4.1 3.7 3.4* 3.5  CL 110 111 113* 111 114*  CO2 19* 17* 16* 20* 20*  GLUCOSE 102* 101* 108* 94 90  BUN 61* 62* 56* 54* 52*  CREATININE 5.71* 5.78* 5.34* 5.28* 5.16*  CALCIUM 9.4 9.3 9.8 9.9 9.7  MG  --  1.2* 1.5*  --   --     GFR: Estimated Creatinine Clearance: 11.1 mL/min (A) (by C-G formula based on SCr of 5.16 mg/dL (H)).  Liver Function Tests: No results for input(s): AST, ALT, ALKPHOS, BILITOT, PROT, ALBUMIN in the last 168 hours. No results for input(s): LIPASE, AMYLASE in the last 168 hours. No results for input(s):  AMMONIA in the last 168 hours.  Coagulation Profile: No results for input(s): INR, PROTIME in the last 168 hours.  Cardiac Enzymes: No results for input(s): CKTOTAL, CKMB, CKMBINDEX, TROPONINI in the last 168 hours.  BNP (last 3 results) No results for input(s): PROBNP in the last 8760 hours.  HbA1C: No results for input(s): HGBA1C in the last 72 hours.  CBG: Recent Labs  Lab 10/28/18 1120 10/28/18 1637 10/28/18 2147 10/29/18 0745 10/29/18 1138  GLUCAP 120* 81 95 82 105*    Lipid Profile: No results for input(s): CHOL, HDL, LDLCALC, TRIG, CHOLHDL, LDLDIRECT in the last 72 hours.  Thyroid Function Tests: No results for input(s): TSH, T4TOTAL, FREET4, T3FREE, THYROIDAB in the last 72 hours.  Anemia Panel: No results for input(s): VITAMINB12, FOLATE, FERRITIN, TIBC, IRON, RETICCTPCT in the last 72 hours.  Urine analysis:    Component Value Date/Time   COLORURINE YELLOW 10/21/2018 0551   APPEARANCEUR CLEAR 10/21/2018 0551   LABSPEC 1.011 10/21/2018 0551   PHURINE 6.0 10/21/2018 0551   GLUCOSEU NEGATIVE 10/21/2018 0551   HGBUR MODERATE (A) 10/21/2018 0551   HGBUR negative 02/10/2010 1135   BILIRUBINUR NEGATIVE 10/21/2018 0551   BILIRUBINUR neg 06/11/2013 1510   KETONESUR NEGATIVE 10/21/2018 0551   PROTEINUR 30 (A) 10/21/2018 0551   UROBILINOGEN 0.2 06/11/2013 1510   UROBILINOGEN 1.0 04/08/2012 1622   NITRITE NEGATIVE 10/21/2018 0551   LEUKOCYTESUR NEGATIVE 10/21/2018 0551    Sepsis Labs: Lactic Acid, Venous    Component Value Date/Time   LATICACIDVEN 2.9 (HH) 10/21/2018 1724    MICROBIOLOGY: Recent Results (from the past 240 hour(s))  Blood Culture (routine x 2)     Status: None   Collection Time: 10/21/18  3:15 AM  Result Value Ref Range Status   Specimen Description BLOOD RIGHT ARM  Final   Special Requests   Final    BOTTLES DRAWN AEROBIC AND ANAEROBIC Blood Culture adequate volume   Culture  Setup Time NO ORGANISMS SEEN  Final   Culture   Final     NO GROWTH 5 DAYS Performed at [facility], [address], [place], [place] [address]    Report Status 10/26/2018 FINAL  Final  Blood Culture (routine x 2)     Status: None   Collection Time: 10/21/18  3:30 AM  Result Value Ref Range Status   Specimen Description BLOOD RIGHT HAND  Final   Special Requests   Final    BOTTLES DRAWN AEROBIC ONLY Blood Culture adequate volume   Culture   Final    NO GROWTH 5 DAYS Performed at [facility], [place] [address], [place], [place] [address]    Report Status 10/26/2018 FINAL  Final  Urine culture     Status:  None   Collection Time: 10/21/18  5:51 AM  Result Value Ref Range Status   Specimen Description URINE, CATHETERIZED  Final   Special Requests NONE  Final   Culture   Final    NO GROWTH Performed at [facility], [address], [place], [place] [address]    Report Status 10/22/2018 FINAL  Final  MRSA PCR Screening     Status: Abnormal   Collection Time: 10/22/18  8:08 AM  Result Value Ref Range Status   MRSA by PCR POSITIVE (A) NEGATIVE Final    Comment:        The GeneXpert MRSA Assay (FDA approved for NASAL specimens only), is one component of a comprehensive MRSA colonization surveillance program. It is not intended to diagnose MRSA infection nor to guide or monitor treatment for MRSA infections. CRITICAL RESULT CALLED TO, READ BACK BY AND VERIFIED WITH: L. BENNET, RN AT 0945 ON 10/22/18 BY C. JESSUP, MLT. Performed at [facility], [place] [address], [place], [place] [address]     RADIOLOGY STUDIES/RESULTS: Ct Abdomen Pelvis Wo Contrast  Result Date: 10/21/2018 CLINICAL DATA:  Abdominal pain and confusion. EXAM: CT ABDOMEN AND PELVIS WITHOUT CONTRAST TECHNIQUE: Multidetector CT imaging of the abdomen and pelvis was performed following the standard protocol without IV contrast. COMPARISON:  CT abdomen pelvis 11/18/2017 FINDINGS: LOWER CHEST: Multiple bibasilar pulmonary nodules, measuring up to 10 mm.  HEPATOBILIARY: The hepatic contours and density are normal. There is no intra- or extrahepatic biliary dilatation. The gallbladder is normal. PANCREAS: The pancreatic parenchymal contours are normal and there is no ductal dilatation. There is no peripancreatic fluid collection. SPLEEN: Normal. ADRENALS/URINARY TRACT: --Adrenal glands: Normal. --Right kidney/ureter: There is moderate hydroureteronephrosis. No nephroureterolithiasis or other clear source of obstruction. --Left kidney/ureter: There is moderate hydroureteronephrosis. No nephroureterolithiasis or other clear source of obstruction. --Urinary bladder: Urinary bladder is decompressed. STOMACH/BOWEL: --Stomach/Duodenum: There is no hiatal hernia or other gastric abnormality. The duodenal course and caliber are normal. --Small bowel: No dilatation or inflammation. --Colon: No focal abnormality. --Appendix: Not visualized. No right lower quadrant inflammation or free fluid. VASCULAR/LYMPHATIC: Atherosclerotic calcification is present within the non-aneurysmal abdominal aorta, without hemodynamically significant stenosis. No abdominal or pelvic lymphadenopathy. REPRODUCTIVE: There is a T-shaped contraceptive device within the uterus. Area of hypoattenuation at the anterior uterine fundus appears unchanged, though poorly characterized without IV contrast MUSCULOSKELETAL. Multilevel degenerative disc disease and facet arthrosis. No bony spinal canal stenosis. OTHER: None. IMPRESSION: 1. Moderate bilateral hydroureteronephrosis. No nephroureterolithiasis or other clear source of obstruction. 2. Multiple bibasilar pulmonary nodules, measuring up to 10 mm, new since 11/18/17. These are concerning for metastatic disease. 3. Limited assessment of the uterine parenchyma without IV contrast material. Generally unchanged appearance of anterior fundal region of hypoattenuation. Aortic Atherosclerosis (ICD10-I70.0). Electronically Signed   By: Ulyses Jarred M.D.   On:  10/21/2018 06:02   Dg Chest 2 View  Result Date: 10/26/2018 CLINICAL DATA:  Community acquired pneumonia. EXAM: CHEST - 2 VIEW COMPARISON:  Radiograph October 23, 2018. FINDINGS: Stable cardiomegaly. No pneumothorax is noted. No significant pleural effusion is noted. Increased left upper lobe airspace opacity is noted concerning for worsening pneumonia. There also appears to be developing right upper lobe opacity which may represent pneumonia. Bony thorax is unremarkable. IMPRESSION: Increased bilateral upper lobe opacities are noted concerning for possible pneumonia. Continued radiographic follow-up is recommended. Electronically Signed   By: Marijo Conception, M.D.   On: 10/26/2018 10:18   Dg Chest 2  View  Result Date: 10/21/2018 CLINICAL DATA:  Acute onset of altered mental status. Confusion. EXAM: CHEST - 2 VIEW COMPARISON:  Chest radiograph performed 07/06/2018, and CTA of the chest performed 07/03/2018 FINDINGS: The lungs are well-aerated. Patchy bilateral airspace opacification is similar in appearance to prior studies and may reflect recurrent pulmonary edema or possibly pneumonia. A small left pleural effusion is again noted. No pneumothorax is seen. The cardiomediastinal silhouette is borderline normal in size. There appears to be a mildly displaced fracture of the left lateral fifth rib. IMPRESSION: 1. Patchy bilateral airspace opacification is similar in appearance to prior studies and may reflect recurrent pulmonary edema or possibly pneumonia. Small left pleural effusion again noted. 2. Apparent mildly displaced fracture of the left lateral fifth rib. Electronically Signed   By: Garald Balding M.D.   On: 10/21/2018 04:24   Ct Head Wo Contrast  Result Date: 10/21/2018 CLINICAL DATA:  Acute onset of worsening confusion. Hematuria. EXAM: CT HEAD WITHOUT CONTRAST TECHNIQUE: Contiguous axial images were obtained from the base of the skull through the vertex without intravenous contrast.  COMPARISON:  CT of the head performed 04/24/2018 FINDINGS: Brain: No evidence of acute infarction, hemorrhage, hydrocephalus, extra-axial collection or mass lesion / mass effect. Prominence of the ventricles and sulci reflects mild cortical volume loss. Diffuse periventricular and subcortical white matter change likely reflects small vessel ischemic microangiopathy. Chronic ischemic change is noted at the basal ganglia bilaterally. Mild cerebellar atrophy is noted. The brainstem and fourth ventricle are within normal limits. The basal ganglia are unremarkable in appearance. The cerebral hemispheres demonstrate grossly normal gray-white differentiation. No mass effect or midline shift is seen. Vascular: No hyperdense vessel or unexpected calcification. Skull: There is no evidence of fracture; visualized osseous structures are unremarkable in appearance. Sinuses/Orbits: The orbits are within normal limits. The paranasal sinuses and mastoid air cells are well-aerated. Other: No significant soft tissue abnormalities are seen. IMPRESSION: 1. No acute intracranial pathology seen on CT. 2. Mild cortical volume loss and diffuse small vessel ischemic microangiopathy. 3. Chronic ischemic change at the basal ganglia bilaterally. Electronically Signed   By: Garald Balding M.D.   On: 10/21/2018 05:55   Dg Chest Port 1 View  Result Date: 10/23/2018 CLINICAL DATA:  Shortness of breath. EXAM: PORTABLE CHEST 1 VIEW COMPARISON:  Radiographs of October 21, 2018. FINDINGS: Stable cardiomediastinal silhouette. No pneumothorax or pleural effusion is noted. Right lung is clear. Patchy opacity is noted in left midlung laterally concerning for possible pneumonia. Bony thorax is unremarkable. IMPRESSION: Probable left midlung pneumonia. Followup PA and lateral chest X-ray is recommended in 3-4 weeks following trial of antibiotic therapy to ensure resolution and exclude underlying malignancy. Electronically Signed   By: Marijo Conception,  M.D.   On: 10/23/2018 07:54   Dg C-arm 1-60 Min-no Report  Result Date: 10/21/2018 Fluoroscopy was utilized by the requesting physician.  No radiographic interpretation.     LOS: 8 days   Signature  Lala Lund M.D on 10/29/2018 at 11:45 AM   -  To page go to [email or web address] - password [email or web address]

## 2018-10-29 NOTE — Progress Notes (Signed)
[place] for Heparin Indication: atrial fibrillation h/o DVT/PE on chronic Eliquis  Allergies  Allergen Reactions  . Anesthetics, Halogenated Shortness Of Breath    10/22/18 Spoke to patients son.  Shortness of breath occurred with Ether when patient was a child.    [place] [Levofloxacin In D5w] Itching  . Lamotrigine Itching  . Tramadol Rash   Patient Measurements: Height: 5\' 5"  (165.1 cm) Weight: 228 lb 6.3 oz (103.6 kg) IBW/kg (Calculated) : 57 Heparin Dosing Weight: 86 kg  Vital Signs: Temp: 97.9 F (36.6 C) (01/05 0746) Temp Source: Axillary (01/05 0746) BP: 134/64 (01/05 0746) Pulse Rate: 68 (01/05 0746)  Labs: Recent Labs    10/27/18 0832 10/28/18 0656 10/29/18 0545  HGB 9.6* 9.6* 8.9*  HCT 31.4* 31.4* 28.9*  PLT 377 388 349  HEPARINUNFRC 0.52 0.32 0.47  CREATININE 5.34* 5.28* 5.16*    Estimated Creatinine Clearance: 11.1 mL/min (A) (by C-G formula based on SCr of 5.16 mg/dL (H)).   Assessment: 78 y.o female morbidly obese w/ PMH DVT/PE on Eliquis, presents to ED 12/28 with AMS, hematuria and abdominal discomfort, h/o endometrial cancer.  On Eliquis PTA, last dose taken pta was on 12/27 at 19:00.   Found to be in acute renal failure. Pharmacy consulted to start IV heparin while eliquis on hold due to acute renal failure.   Heparin level remains therapeutic at 0.47, H/H low but stable, plts wnl. No bleeding or infusion issues noted.  Goal of Therapy:  Heparin level 0.3-0.7 units/mL Monitor platelets by anticoagulation protocol: Yes   Plan:  Continue heparin gtt at 1250 units/hr Daily heparin level, CBC, s/s bleeding  Jackson Latino, PharmD PGY1 Pharmacy Resident Phone [phone] 10/29/2018     8:58 AM

## 2018-10-29 NOTE — Consult Note (Signed)
Consultation Note Date: 10/29/2018   Patient Name: Theresa Blake  DOB: [birth year]  MRN: [id number]  Age / Sex: 78 y.o., female  PCP: Tracie Harrier, MD Referring Physician: Thurnell Lose, MD  Reason for Consultation: Establishing goals of care, Hospice Evaluation and Psychosocial/spiritual support  HPI/Patient Profile: 78 y.o. female  with past medical history of COPD on home O2, chronic kidney disease stage III, hypothyroidism, hypertension, hyperlipidemia, history of PE with IVC filter, depression with panic disorder, diabetes type 2, hypoventilation syndrome, history of endometrial cancer with radiation treatment  (not a surgical candidate secondary to comorbidities) admitted on 10/21/2018 with elevated creatinine, altered mental status.  Creatinine on admission was 9.39.  Patient's baseline is 1.5.  She was found to have pneumonia.  She has completed a course of antibiotics.  She underwent renal stenting and her creatinine is slowly improving.  Per nephrology, it is doubtful that her creatinine will return to her baseline .  Also on this admission she had bilateral hydronephrosis when admitted and felt like this was due to cancer burden in the pelvic area and new pulmonary mets have been discovered.  Patient was referred to oncology in January 2019 but was unable to attend that appointment.  She has been bedbound for the past 2 years..   Consult ordered for goals of care and hospice evaluation  Clinical Assessment and Goals of Care: Patient seen, chart reviewed.  Patient is unable to participate in assessment secondary to acute confusional state, delirium.  She has 4 sons, Theresa Blake and Theresa Blake live locally.  Theresa Blake is the primary spokesperson although Theresa Blake has done a lot of patient's home care.  She has twin sons who reside in [place].  Team is coming up Monday night.  Family all in agreement that their  mother would not wish to pursue further oncological work-up.  They describe poor quality of life for her and desire to take her home with hospice.  Theresa Blake at [phone], is the primary spokesperson for the family.  Palliative medicine has spoken with all children and are all in agreement with the plan to go home with hospice   Patient is a DNR as per discussion between [facility] and Theresa Blake  SUMMARY OF RECOMMENDATIONS   DNR/DNI Home with hospice.  Consult placed to case management.  Patient cannot return home until equipment is in the house.  She will need a hospital bed and oxygen Code Status/Advance Care Planning:  DNR   Palliative Prophylaxis:   Aspiration, Bowel Regimen, Delirium Protocol, Eye Care, Frequent Pain Assessment and Turn Reposition  Additional Recommendations (Limitations, Scope, Preferences):  Avoid Hospitalization, No Artificial Feeding, No Blood Transfusions, No Chemotherapy, No Diagnostics, No Hemodialysis, No Lab Draws, No Radiation and No Surgical Procedures  Psycho-social/Spiritual:   Desire for further Chaplaincy support:no  Additional Recommendations: Referral to Community Resources   Prognosis:   < 3 months  Discharge Planning: Home with Hospice      Primary Diagnoses: Present on Admission: . Acute renal failure superimposed on stage 3 chronic  kidney disease ([place]) . AKI (acute kidney injury) ([place]) . Hypothyroidism . Diabetes mellitus type 2 in obese ([place]) . Morbid obesity due to excess calories ([place]) . Essential hypertension . Endometrial cancer ([place]) . Bil Hydronephrosis--- No stones . Toxic metabolic encephalopathy . Pulmonary metastases (H/o endometrial carcinoma)   I have reviewed the medical record, interviewed the patient and family, and examined the patient. The following aspects are pertinent.  Past Medical History:  Diagnosis Date  . Abnormal Pap smear of cervix   . Allergic rhinitis, cause unspecified   . Backache,  unspecified   . Depression    mood disorder; ? bipolar  . Dermatomycosis, unspecified   . Diabetes mellitus type II   . Edema   . Foot fracture, right   . HLD (hyperlipidemia)   . HTN (hypertension)   . Hx pulmonary embolism    multiple  . Lumbar spinal stenosis   . Malignant neoplasm of corpus uteri, except isthmus ([place])   . Morbid obesity ([place])   . Other specified disease of hair and hair follicles   . Pain in joint, pelvic region and thigh   . Panic disorder without agoraphobia    Social History   Socioeconomic History  . Marital status: Widowed    Spouse name: Not on file  . Number of children: 4  . Years of education: Not on file  . Highest education level: Not on file  Occupational History  . Occupation: Disabled  Social Needs  . Financial resource strain: Not on file  . Food insecurity:    Worry: Not on file    Inability: Not on file  . Transportation needs:    Medical: Not on file    Non-medical: Not on file  Tobacco Use  . Smoking status: Never Smoker  . Smokeless tobacco: Never Used  Substance and Sexual Activity  . Alcohol use: No  . Drug use: No  . Sexual activity: Not on file  Lifestyle  . Physical activity:    Days per week: Not on file    Minutes per session: Not on file  . Stress: Not on file  Relationships  . Social connections:    Talks on phone: Not on file    Gets together: Not on file    Attends religious service: Not on file    Active member of club or organization: Not on file    Attends meetings of clubs or organizations: Not on file    Relationship status: Not on file  Other Topics Concern  . Not on file  Social History Narrative   Widow-husband died with MI, DM      4 sons      Disability      Compulsive overeater            Family History  Problem Relation Age of Onset  . Heart attack Father 48  . Pancreatic cancer Mother        with mets  . Hyperlipidemia Son   . Hyperlipidemia Son   . Gout Son    Scheduled  Meds: . clonazePAM  0.5 mg Oral BID  . ezetimibe-simvastatin  1 tablet Oral QHS  . fluticasone  2 spray Each Nare Daily  . furosemide  40 mg Oral Daily  . levothyroxine  100 mcg Oral QAC breakfast  . metoprolol tartrate  50 mg Oral BID  . multivitamin with minerals  1 tablet Oral Daily  . pantoprazole  40 mg Oral Daily  . sodium bicarbonate  1,300 mg Oral TID  . venlafaxine XR  150 mg Oral Daily   Continuous Infusions: . sodium chloride 50 mL/hr at 10/28/18 2026  . albuterol 10 mg/hr (10/21/18 0653)  . heparin 1,250 Units/hr (10/29/18 0911)   PRN Meds:.acetaminophen **OR** [DISCONTINUED] acetaminophen, albuterol, [DISCONTINUED] ondansetron **OR** ondansetron (ZOFRAN) IV, polyethylene glycol, traZODone Medications Prior to Admission:  Prior to Admission medications   Medication Sig Start Date End Date Taking? Authorizing Provider  acetaminophen (TYLENOL) 325 MG tablet Take 650 mg by mouth every 6 (six) hours as needed for mild pain or headache.   Yes [provider]  aspirin EC 81 MG EC tablet Take 1 tablet (81 mg total) by mouth daily. 07/09/18  Yes Lavina Hamman, MD  buPROPion (WELLBUTRIN XL) 150 MG 24 hr tablet Take 300 mg by mouth daily.    Yes [provider]  clonazePAM (KLONOPIN) 1 MG tablet Take 1 mg by mouth 2 (two) times daily. 06/14/18  Yes [provider]  diclofenac sodium (VOLTAREN) 1 % GEL Apply 2 g topically every 8 (eight) hours as needed (lower back pain).  05/30/18  Yes [provider]  docusate sodium (COLACE) 100 MG capsule Take 100 capsules by mouth daily as needed for mild constipation.    Yes [provider]  ELIQUIS 2.5 MG TABS tablet Take 2.5 mg by mouth 2 (two) times daily. 05/24/18  Yes [provider]  ezetimibe-simvastatin (VYTORIN) 10-20 MG tablet Take 1 tablet by mouth at bedtime.    Yes [provider]  fluticasone (FLONASE) 50 MCG/ACT nasal spray Place 2 sprays into both nostrils daily.   Yes  [provider]  levothyroxine (SYNTHROID, LEVOTHROID) 100 MCG tablet Take 1 tablet (100 mcg total) by mouth daily before breakfast. 07/09/18  Yes Lavina Hamman, MD  metFORMIN (GLUCOPHAGE) 500 MG tablet Take 500 mg by mouth daily. 05/26/18  Yes [provider]  Multiple Vitamin (MULTIVITAMIN WITH MINERALS) TABS tablet Take 1 tablet by mouth daily.   Yes [provider]  omeprazole (PRILOSEC) 20 MG capsule TAKE ONE CAPSULE BY MOUTH EVERY DAY *NEEDS OFFICE VISIT* Patient taking differently: Take 20 mg by mouth daily.    Yes Einar Pheasant, MD  Venlafaxine HCl 225 MG TB24 Take 225 mg by mouth daily.    Yes [provider]  furosemide (LASIX) 40 MG tablet Take 1 tablet (40 mg total) by mouth as directed. 1 tablet Twice a day for 4 days, from 07/12/2018 take one tablet daily. 07/08/18   Lavina Hamman, MD   Allergies  Allergen Reactions  . Anesthetics, Halogenated Shortness Of Breath    10/22/18 Spoke to patients son.  Shortness of breath occurred with Ether when patient was a child.    [place] [Levofloxacin In D5w] Itching  . Lamotrigine Itching  . Tramadol Rash   Review of Systems  Unable to perform ROS: Mental status change    Physical Exam Vitals signs and nursing note reviewed.  Constitutional:      Comments: Acutely ill-appearing elderly female; she is confused,  Neck:     Musculoskeletal: Normal range of motion.  Pulmonary:     Effort: Pulmonary effort is normal.  Skin:    General: Skin is warm and dry.     Coloration: Skin is pale.  Neurological:     Mental Status: She is alert.     Comments: Oriented to self.  Recognizes her children  Psychiatric:     Comments: Psychomotor restlessness observed otherwise unable  to test     Vital Signs: BP 134/64 (BP Location: Left Arm)   Pulse 68   Temp 97.9 F (36.6 C) (Axillary)   Resp 18   Ht 5\' 5"  (1.651 m)   Wt 103.6 kg   SpO2 95%   BMI 38.01 kg/m  Pain Scale: 0-10 POSS *See Group  Information*: S-Acceptable,Sleep, easy to arouse Pain Score: 0-No pain   SpO2: SpO2: 95 % O2 Device:SpO2: 95 % O2 Flow Rate: .O2 Flow Rate (L/min): 2 L/min  IO: Intake/output summary:   Intake/Output Summary (Last 24 hours) at 10/29/2018 1610 Last data filed at 10/29/2018 1219 Gross per 24 hour  Intake 1004.01 ml  Output 1950 ml  Net -945.99 ml    LBM: Last BM Date: 10/27/18 Baseline Weight: Weight: 120 kg Most recent weight: Weight: 103.6 kg     Palliative Assessment/Data:   Flowsheet Rows     Most Recent Value  Intake Tab  Referral Department  Hospitalist  Unit at Time of Referral  Med/Surg Unit  Palliative Care Primary Diagnosis  Nephrology  Date Notified  10/27/18  Palliative Care Type  Return patient Palliative Care  Reason for referral  Clarify Goals of Care  Date of Admission  10/21/18  Date first seen by Palliative Care  10/29/18  # of days Palliative referral response time  2 Day(s)  # of days IP prior to Palliative referral  6  Clinical Assessment  Palliative Performance Scale Score  30%  Pain Max last 24 hours  Not able to report  Pain Min Last 24 hours  Not able to report  Dyspnea Max Last 24 Hours  Not able to report  Dyspnea Min Last 24 hours  Not able to report  Nausea Max Last 24 Hours  Not able to report  Nausea Min Last 24 Hours  Not able to report  Anxiety Max Last 24 Hours  Not able to report  Anxiety Min Last 24 Hours  Not able to report  Other Max Last 24 Hours  Not able to report  Psychosocial & Spiritual Assessment  Palliative Care Outcomes  Patient/Family meeting held?  Yes  Who was at the meeting?  sons Larey Dresser and Elliot Cousin via phone  Palliative Care Outcomes  Counseled regarding hospice, Provided psychosocial or spiritual support, Clarified goals of care  Patient/Family wishes: Interventions discontinued/not started   Mechanical Ventilation, BiPAP, Hemodialysis, Transfusion, Tube feedings/TPN, PEG, Trach, NIPPV, Vasopressors    Palliative Care follow-up planned  Yes, Facility      Time In: 1100 Time Out: 1200 Time Total: 60 min Greater than 50%  of this time was spent counseling and coordinating care related to the above assessment and plan. Staffed with Dr. Candiss Norse Signed by: Dory Horn, NP   Please contact Palliative Medicine Team phone at [phone] for questions and concerns.  For individual provider: See Shea Evans

## 2018-10-29 NOTE — Progress Notes (Signed)
[facility]    NEPHROLOGY PROGRESS NOTE  SUBJECTIVE: Patient seen and examined.  Is tearful and crying in the room.  Complains only of being sad, and has no specific physical complaints.  Denies chest pain, shortness of breath, nausea, vomiting, diarrhea or dysuria.  All other review of systems are negative.   OBJECTIVE:  Vitals:   10/29/18 0746 10/29/18 1623  BP: 134/64 (!) 157/98  Pulse: 68 71  Resp: 18 18  Temp: 97.9 F (36.6 C) 98.4 F (36.9 C)  SpO2: 95% 96%   I/O last 3 completed shifts: In: 2232.5 [P.O.:630; I.V.:1282.1; IV Piggyback:320.4] Out: 3700 [Urine:3700]   [place]:  AAOx3 NAD HEENT: MMM  AT anicteric sclera Neck:  No JVD, no adenopathy CV:  Heart RRR  Lungs:  L/S CTA bilaterally Abd:  abd SNT/ND with normal BS GU:  Bladder non-palpable Extremities: +1 bilateral lower extremity edema Skin:  No skin rash  MEDICATIONS:   Current Facility-Administered Medications:  .  0.9 %  sodium chloride infusion, , Intravenous, Continuous, Claudia Desanctis, MD, Last Rate: 50 mL/hr at 10/29/18 1646 .  acetaminophen (TYLENOL) tablet 650 mg, 650 mg, Oral, Q6H PRN **OR** [DISCONTINUED] acetaminophen (TYLENOL) suppository 650 mg, 650 mg, Rectal, Q6H PRN, Franchot Gallo, MD .  albuterol (PROVENTIL) (2.5 MG/3ML) 0.083% nebulizer solution 2.5 mg, 2.5 mg, Nebulization, Q2H PRN, Franchot Gallo, MD .  albuterol (PROVENTIL,VENTOLIN) solution continuous neb, 10 mg/hr, Nebulization, Continuous, Dahlstedt, Stephen, MD, Last Rate: 2 mL/hr at 10/21/18 0653, 10 mg/hr at 10/21/18 0653 .  clonazePAM (KLONOPIN) tablet 0.5 mg, 0.5 mg, Oral, BID, Thurnell Lose, MD, 0.5 mg at 10/29/18 1117 .  fluticasone (FLONASE) 50 MCG/ACT nasal spray 2 spray, 2 spray, Each Nare, Daily, Dahlstedt, Stephen, MD, 2 spray at 10/29/18 1118 .  furosemide (LASIX) tablet 40 mg, 40 mg, Oral, Daily, Justin Mend, MD, 40 mg at 10/29/18 1117 .  levothyroxine (SYNTHROID, LEVOTHROID) tablet  100 mcg, 100 mcg, Oral, QAC breakfast, Franchot Gallo, MD, 100 mcg at 10/29/18 0911 .  metoprolol tartrate (LOPRESSOR) tablet 50 mg, 50 mg, Oral, BID, Thurnell Lose, MD, 50 mg at 10/29/18 1117 .  multivitamin with minerals tablet 1 tablet, 1 tablet, Oral, Daily, Franchot Gallo, MD, 1 tablet at 10/29/18 1116 .  [DISCONTINUED] ondansetron (ZOFRAN) tablet 4 mg, 4 mg, Oral, Q6H PRN **OR** ondansetron (ZOFRAN) injection 4 mg, 4 mg, Intravenous, Q6H PRN, Franchot Gallo, MD .  pantoprazole (PROTONIX) EC tablet 40 mg, 40 mg, Oral, Daily, Franchot Gallo, MD, 40 mg at 10/29/18 1116 .  polyethylene glycol (MIRALAX / GLYCOLAX) packet 17 g, 17 g, Oral, Daily PRN, Franchot Gallo, MD, 17 g at 10/27/18 2127 .  sodium bicarbonate tablet 1,300 mg, 1,300 mg, Oral, TID, Justin Mend, MD, 1,300 mg at 10/29/18 1648 .  traZODone (DESYREL) tablet 50 mg, 50 mg, Oral, QHS PRN, Franchot Gallo, MD, 50 mg at 10/28/18 2120 .  venlafaxine XR (EFFEXOR-XR) 24 hr capsule 150 mg, 150 mg, Oral, Daily, Harrie Jeans C, MD, 150 mg at 10/29/18 1118     LABS:  CBC Latest Ref Rng & Units 10/29/2018 10/28/2018 10/27/2018  WBC 4.0 - 10.5 K/uL 11.9(H) 12.9(H) 14.4(H)  Hemoglobin 12.0 - 15.0 g/dL 8.9(L) 9.6(L) 9.6(L)  Hematocrit 36.0 - 46.0 % 28.9(L) 31.4(L) 31.4(L)  Platelets 150 - 400 K/uL 349 388 377    CMP Latest Ref Rng & Units 10/29/2018 10/28/2018 10/27/2018  Glucose 70 - 99 mg/dL 90 94 108(H)  BUN 8 - 23 mg/dL 52(H) 54(H)  56(H)  Creatinine 0.44 - 1.00 mg/dL 5.16(H) 5.28(H) 5.34(H)  Sodium 135 - 145 mmol/L 144 142 141  Potassium 3.5 - 5.1 mmol/L 3.5 3.4(L) 3.7  Chloride 98 - 111 mmol/L 114(H) 111 113(H)  CO2 22 - 32 mmol/L 20(L) 20(L) 16(L)  Calcium 8.9 - 10.3 mg/dL 9.7 9.9 9.8  Total Protein 6.5 - 8.1 g/dL - - -  Total Bilirubin 0.3 - 1.2 mg/dL - - -  Alkaline Phos 38 - 126 U/L - - -  AST 15 - 41 U/L - - -  ALT 0 - 44 U/L - - -    Lab Results  Component Value Date   CALCIUM 9.7 10/29/2018    CAION 1.30 06/30/2016   PHOS 5.9 (H) 10/21/2018       Component Value Date/Time   COLORURINE YELLOW 10/21/2018 [place] 10/21/2018 0551   LABSPEC 1.011 10/21/2018 0551   PHURINE 6.0 10/21/2018 0551   GLUCOSEU NEGATIVE 10/21/2018 0551   HGBUR MODERATE (A) 10/21/2018 0551   HGBUR negative 02/10/2010 1135   BILIRUBINUR NEGATIVE 10/21/2018 0551   BILIRUBINUR neg 06/11/2013 1510   KETONESUR NEGATIVE 10/21/2018 0551   PROTEINUR 30 (A) 10/21/2018 0551   UROBILINOGEN 0.2 06/11/2013 1510   UROBILINOGEN 1.0 04/08/2012 1622   NITRITE NEGATIVE 10/21/2018 0551   LEUKOCYTESUR NEGATIVE 10/21/2018 0551      Component Value Date/Time   PHART 7.326 (L) 07/03/2018 0215   PCO2ART 58.5 (H) 07/03/2018 0215   PO2ART 94.0 07/03/2018 0215   HCO3 30.9 (H) 07/03/2018 0215   TCO2 33 (H) 07/03/2018 0215   O2SAT 97.0 07/03/2018 0215    No results found for: IRON, TIBC, FERRITIN, IRONPCTSAT     ASSESSMENT/PLAN:     Problem List Items Addressed This Visit      Nervous and Auditory   Delirium     Genitourinary   * (Principal) AKI (acute kidney injury) ([place])    Other Visit Diagnoses    Acute kidney injury superimposed on chronic kidney disease ([place])    -  Primary   HCAP (healthcare-associated pneumonia)       Relevant Medications   cefTRIAXone (ROCEPHIN) 1 g in sodium chloride 0.9 % 100 mL IVPB (Completed)   azithromycin (ZITHROMAX) 500 mg in sodium chloride 0.9 % 250 mL IVPB (Completed)   albuterol (PROVENTIL,VENTOLIN) solution continuous neb   fluticasone (FLONASE) 50 MCG/ACT nasal spray 2 spray   albuterol (PROVENTIL) (2.5 MG/3ML) 0.083% nebulizer solution 2.5 mg   mupirocin ointment (BACTROBAN) 2 % 1 application (Completed)   SOB (shortness of breath)       Relevant Orders   DG Chest Port 1 View (Completed)   CAP (community acquired pneumonia)       Relevant Medications   cefTRIAXone (ROCEPHIN) 1 g in sodium chloride 0.9 % 100 mL IVPB (Completed)   azithromycin  (ZITHROMAX) 500 mg in sodium chloride 0.9 % 250 mL IVPB (Completed)   albuterol (PROVENTIL,VENTOLIN) solution continuous neb   fluticasone (FLONASE) 50 MCG/ACT nasal spray 2 spray   albuterol (PROVENTIL) (2.5 MG/3ML) 0.083% nebulizer solution 2.5 mg   Other Relevant Orders   DG Chest 2 View (Completed)      78 year old female patient with a history of hypertension and diabetes who presented with confusion and hematuria.  She had had a poor p.o. intake.  She was found to have moderate bilateral hydronephrosis without stone or clear source of obstruction, with a history of uterine cancer status post XRT at [facility].  There is  also concern for bilateral pulmonary nodules consistent with metastatic disease.  1.  Chronic kidney disease stage III with a baseline serum creatinine around 1.5 likely secondary to diabetes and hypertension.  2.  Acute kidney injury secondary to obstruction.  Has suspected recurrent endometrial cancer and is now status post bilateral ureteral stents.  Serum creatinine is stable.  I suspect she has been obstructed for some time, so she will likely not get back to her previous baseline.  Dialysis is not currently indicated.  I suspect she would be a suboptimal candidate secondary to poor functional status.  Continue oral furosemide for now.  Needs to follow-up with urology regarding stents.  3.  Metabolic acidosis.  Bicarb is stable at 20 on sodium bicarbonate supplementation.  Will monitor on current dose for now.  4.  Hypertension.  Overall control is reasonable.  5.  Change in mental status/encephalopathy.  This is thought to be secondary to acute illness.  6.  Pulmonary nodules.  Possible pneumonia versus metastatic disease.  Per primary team.  7.  Diabetes mellitus.  Maintain off of metformin indefinitely.  8.  History of a PE.  Will defer to primary team.  9.  Hyperphosphatemia.  Will recheck.    [place], DO, [place]

## 2018-10-29 NOTE — Care Management Note (Addendum)
Case Management Note  Patient Details  Name: Theresa Blake MRN: [id number] Date of Birth: 05-06-1941  Subjective/Objective:        Pt presented for metabolic encephalopathy with history of endometrial cancer and COPD.  Pt  found to have new bilateral hydronephrosis and new pulmonary mets with worsening kidney function.  Pt from home with son, Theresa Blake.  Pt's son, Theresa Blake [phone]) is the contact person and is very involved in the patient's care.  He works during the day, but is planning to take FMLA when patient returns home.   Pt is total care, requiring assistance to position in bed and a lift for transfers.  Pt has DME O2 but Theresa Blake will need to find out agency from brother, Theresa Blake.  Pt will need hospital bed prior to d/c and may need a mechanical lift.  Theresa Blake states the family is flexible on d/c date.  Pt will need ambulance transport home.  Action/Plan: Spoke with Theresa Blake over the phone at 5:40pm.  He states they would like to use [facility]. Spoke with Lattie Haw from [facility] who will hand-off to staff for tomorrow.  CM will need to call [facility] with details to make official referral tomorrow morning.   Expected Discharge Date:                  Expected Discharge Plan:  Home w Hospice Care  In-House Referral:  Clinical Social Work  Discharge planning Services  CM Consult  Post Acute Care Choice:  Hospice Choice offered to:  Adult Children  DME Arranged:  Hospital bed, Oxygen DME Agency:     HH Arranged:    HH Agency:  [facility]  Status of Service:  In process, will continue to follow  If discussed at Long Length of Stay Meetings, dates discussed:    Additional Comments:  Claudie Leach, RN 10/29/2018, 6:00 PM

## 2018-10-29 NOTE — Progress Notes (Signed)
[facility])  Received request from [place] for family interest in [facility] services at home after discharge. No family present at this time. Spoke briefly with son [name] by phone to acknowledge referral. He was unable to talk long due to environment. He prefers that we speak outside the room when he is here. Will follow up with [name] again in am.   Thank you,  Erling Conte, LCSW [phone]

## 2018-10-30 ENCOUNTER — Inpatient Hospital Stay (HOSPITAL_COMMUNITY): Payer: Medicare Other

## 2018-10-30 LAB — BRAIN NATRIURETIC PEPTIDE: B Natriuretic Peptide: 2669.5 pg/mL — ABNORMAL HIGH (ref 0.0–100.0)

## 2018-10-30 LAB — PHOSPHORUS: Phosphorus: 4.2 mg/dL (ref 2.5–4.6)

## 2018-10-30 MED ORDER — METOLAZONE 2.5 MG PO TABS
2.5000 mg | ORAL_TABLET | Freq: Once | ORAL | Status: AC
  Administered 2018-10-30: 2.5 mg via ORAL
  Filled 2018-10-30: qty 1

## 2018-10-30 MED ORDER — POTASSIUM CHLORIDE CRYS ER 20 MEQ PO TBCR
40.0000 meq | EXTENDED_RELEASE_TABLET | Freq: Once | ORAL | Status: AC
  Administered 2018-10-30: 40 meq via ORAL
  Filled 2018-10-30: qty 2

## 2018-10-30 MED ORDER — FUROSEMIDE 80 MG PO TABS
80.0000 mg | ORAL_TABLET | Freq: Every day | ORAL | Status: DC
  Administered 2018-10-30 – 2018-10-31 (×2): 80 mg via ORAL
  Filled 2018-10-30 (×2): qty 1

## 2018-10-30 MED ORDER — MAGNESIUM SULFATE 2 GM/50ML IV SOLN
2.0000 g | Freq: Once | INTRAVENOUS | Status: AC
  Administered 2018-10-30: 2 g via INTRAVENOUS
  Filled 2018-10-30: qty 50

## 2018-10-30 NOTE — Progress Notes (Signed)
PROGRESS NOTE        PATIENT DETAILS Name: Theresa Blake Age: 78 y.o. Sex: female Date of Birth: 11/02/1940 Admit Date: 10/21/2018 Admitting Physician Courage Denton Brick, MD TMA:[id number], Cherlyn Labella, MD  Brief Narrative: Patient is a 78 y.o. female with history of morbid obesity, DVT/PE on anticoagulation, CKD stage III anterior cancer, chronic hypoxic respiratory failure on home O2 presented with acute metabolic encephalopathy in the setting of acute kidney injury.  She was found to have bilateral hydronephrosis-and underwent bilateral ureteral stent placement by urology.  Renal function slowly improving with supportive care.  See below for further details  Subjective:  In bed, she is in no distress but mildly confused, denies any headache or belly pain, is mildly short of breath, no focal weakness.  Assessment/Plan:  Acute kidney injury: Thought to be secondary to obstructive uropathy from likely recurrent endometrial cancer causing bilateral hydronephrosis, she is status post ureteral stent placement bilaterally by urologist Dr. Diona Fanti.  By urology and nephrology, will require outpatient urology follow-up within 7 to 10 days of discharge.  Has been adequately hydrated and now has evidence of fluid overload along with orthopnea, shortness of breath, elevated proBNP, increase diuresis, family does not want any heroics on HD.  Plan is to transition her to home hospice, family meeting between palliative care and family.  All of care is general medical treatmentdirected towards comfort no heroics.  Acute metabolic encephalopathy: Likely secondary to above-CT head negative on admission.  Will somewhat confused-but is able to follow some commands occasionally.  Hopefully confusion will resolve with improving renal function.  Per son some underlying early dementia also suspected at home.  Continue supportive care.  Hyperkalemia: Resolved, likely secondary to AKI.  Acute  metabolic acidosis: Improving, secondary to AKI.    PNA: Afebrile-mild leukocytosis persists-has completed 7 days of Rocephin/Zithromax-hence we will stop.    Hypothyroidism: Continue Synthroid  Dyslipidemia: Continue Vytorin  Hypertension: Controlled-continue metoprolol.  GERD: Continue PPI  History of venous thromboembolism: Previously on Eliquis-physician to Coumadin now, if family decides on full hospice we will discontinue long-term anticoagulation.  History of endometrial cancer with probable recurrence causing bilateral hydronephrosis and possible pulmonary metastases: Per family (sister at bedside on 1/3 and son over the phone on 1/4) patient was lost to follow-up with oncology-this was by choice.  Morbid obesity - supportive care.  Chronic hypoxic respiratory failure on home O2 3 L  Deconditioning/frailty: Not sure what her usual baseline is-but she appears incredibly weak-PT evaluation completed-SNF on discharge-however-family may consider home with hospice services.  Palliative care: Previous MD Dr Regenia Skeeter with patient's son Zahrah Sutherlin over the phone this morning-Long discussion-he is aware of poor overall prognosis. I had spoken with the patient's sister over at bedside following yesterday afternoon.  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  Per patient's sister-patient probably would not want aggressive care-and probably would not want to pursue treatment of underlying recurrent cancer.  I spoke with the patient's son today-he reconfirmed above-and that family was open to taking patient home with hospice-rather than going to SNF (bad experience in the past).  Have consulted palliative care-will await further recommendations.  Per patient's son-he is agreeable to DNR status.    DVT Prophylaxis: Full dose anticoagulation with Heparin  Code  Status: DNR  Family Communication: Cecel Sauve over the phone  Disposition Plan: Remain inpatient-SNF on discharge-requires several more days of hospitalization.  Antimicrobial agents: Anti-infectives (From admission, onward)   Start     Dose/Rate Route Frequency Ordered Stop   10/22/18 0600  cefTRIAXone (ROCEPHIN) 1 g in sodium chloride 0.9 % 100 mL IVPB  Status:  Discontinued     1 g 200 mL/hr over 30 Minutes Intravenous Every 24 hours 10/21/18 1438 10/28/18 1304   10/22/18 0600  azithromycin (ZITHROMAX) 500 mg in sodium chloride 0.9 % 250 mL IVPB  Status:  Discontinued     500 mg 250 mL/hr over 60 Minutes Intravenous Every 24 hours 10/21/18 1438 10/28/18 1304   10/21/18 0445  cefTRIAXone (ROCEPHIN) 1 g in sodium chloride 0.9 % 100 mL IVPB     1 g 200 mL/hr over 30 Minutes Intravenous  Once 10/21/18 0435 10/21/18 0540   10/21/18 0445  azithromycin (ZITHROMAX) 500 mg in sodium chloride 0.9 % 250 mL IVPB     500 mg 250 mL/hr over 60 Minutes Intravenous  Once 10/21/18 0435 10/21/18 3785      Procedures: 12/28>>Cystoscopy, bilateral retrograde ureteral pyelograms with fluoroscopic interpretation, placement of bilateral double-J stents   CONSULTS:  urology  Time spent: 25- minutes-Greater than 50% of this time was spent in counseling, explanation of diagnosis, planning of further management, and coordination of care.  MEDICATIONS: Scheduled Meds: . clonazePAM  0.5 mg Oral BID  . fluticasone  2 spray Each Nare Daily  . furosemide  80 mg Oral Daily  . levothyroxine  100 mcg Oral QAC breakfast  . metolazone  2.5 mg Oral Once  . metoprolol tartrate  50 mg Oral BID  . multivitamin with minerals  1 tablet Oral Daily  . pantoprazole  40 mg Oral Daily  . potassium chloride  40 mEq Oral Once  . sodium bicarbonate  1,300 mg Oral TID  . venlafaxine XR  150 mg Oral Daily   Continuous Infusions: . albuterol 10 mg/hr (10/21/18 0653)   PRN Meds:.acetaminophen **OR** [DISCONTINUED]  acetaminophen, albuterol, [DISCONTINUED] ondansetron **OR** ondansetron (ZOFRAN) IV, polyethylene glycol, traZODone   PHYSICAL EXAM: Vital signs: Vitals:   10/29/18 0746 10/29/18 1623 10/29/18 1936 10/30/18 0527  BP: 134/64 (!) 157/98 (!) 152/80 (!) 168/78  Pulse: 68 71 84 82  Resp: 18 18 20 20   Temp: 97.9 F (36.6 C) 98.4 F (36.9 C) 98.7 F (37.1 C) 98.4 F (36.9 C)  TempSrc: Axillary Oral Oral Oral  SpO2: 95% 96% 90% 98%  Weight:      Height:       Filed Weights   10/25/18 2100 10/27/18 2121 10/29/18 0412  Weight: 121 kg 122 kg 103.6 kg   Body mass index is 38.01 kg/m.   Exam  Awake but confused, No new F.N deficits, Normal affect [place].AT,PERRAL Supple Neck,No JVD, No cervical lymphadenopathy appriciated.  Symmetrical Chest wall movement, Good air movement bilaterally, +ve rales RRR,No Gallops, Rubs or new Murmurs, No Parasternal Heave +ve B.Sounds, Abd Soft, No tenderness, No organomegaly appriciated, No rebound - guarding or rigidity. No Cyanosis, Clubbing, 2+ edema R>L, No new Rash or bruise   I have personally reviewed following labs and imaging studies  LABORATORY DATA: CBC: Recent Labs  Lab 10/25/18 0541 10/26/18 0233 10/27/18 0832 10/28/18 0656 10/29/18 0545  WBC 11.6* 12.9* 14.4* 12.9* 11.9*  HGB 9.3* 9.3* 9.6* 9.6* 8.9*  HCT 29.4* 30.6* 31.4* 31.4* 28.9*  MCV 90.2 90.3 91.8 91.3  90.6  PLT 371 362 377 388 053    Basic Metabolic Panel: Recent Labs  Lab 10/25/18 0541 10/26/18 0233 10/27/18 0832 10/28/18 0656 10/29/18 0545 10/30/18 0709  NA 140 140 141 142 144  --   K 3.9 4.1 3.7 3.4* 3.5  --   CL 110 111 113* 111 114*  --   CO2 19* 17* 16* 20* 20*  --   GLUCOSE 102* 101* 108* 94 90  --   BUN 61* 62* 56* 54* 52*  --   CREATININE 5.71* 5.78* 5.34* 5.28* 5.16*  --   CALCIUM 9.4 9.3 9.8 9.9 9.7  --   MG  --  1.2* 1.5*  --   --   --   PHOS  --   --   --   --   --  4.2    GFR: Estimated Creatinine Clearance: 11.1 mL/min (A) (by C-G  formula based on SCr of 5.16 mg/dL (H)).  Liver Function Tests: No results for input(s): AST, ALT, ALKPHOS, BILITOT, PROT, ALBUMIN in the last 168 hours. No results for input(s): LIPASE, AMYLASE in the last 168 hours. No results for input(s): AMMONIA in the last 168 hours.  Coagulation Profile: No results for input(s): INR, PROTIME in the last 168 hours.  Cardiac Enzymes: No results for input(s): CKTOTAL, CKMB, CKMBINDEX, TROPONINI in the last 168 hours.  BNP (last 3 results) No results for input(s): PROBNP in the last 8760 hours.  HbA1C: No results for input(s): HGBA1C in the last 72 hours.  CBG: Recent Labs  Lab 10/28/18 1637 10/28/18 2147 10/29/18 0745 10/29/18 1138 10/29/18 1624  GLUCAP 81 95 82 105* 112*    Lipid Profile: No results for input(s): CHOL, HDL, LDLCALC, TRIG, CHOLHDL, LDLDIRECT in the last 72 hours.  Thyroid Function Tests: No results for input(s): TSH, T4TOTAL, FREET4, T3FREE, THYROIDAB in the last 72 hours.  Anemia Panel: No results for input(s): VITAMINB12, FOLATE, FERRITIN, TIBC, IRON, RETICCTPCT in the last 72 hours.  Urine analysis:    Component Value Date/Time   COLORURINE YELLOW 10/21/2018 0551   APPEARANCEUR CLEAR 10/21/2018 0551   LABSPEC 1.011 10/21/2018 0551   PHURINE 6.0 10/21/2018 0551   GLUCOSEU NEGATIVE 10/21/2018 0551   HGBUR MODERATE (A) 10/21/2018 0551   HGBUR negative 02/10/2010 1135   BILIRUBINUR NEGATIVE 10/21/2018 0551   BILIRUBINUR neg 06/11/2013 1510   KETONESUR NEGATIVE 10/21/2018 0551   PROTEINUR 30 (A) 10/21/2018 0551   UROBILINOGEN 0.2 06/11/2013 1510   UROBILINOGEN 1.0 04/08/2012 1622   NITRITE NEGATIVE 10/21/2018 0551   LEUKOCYTESUR NEGATIVE 10/21/2018 0551    Sepsis Labs: Lactic Acid, Venous    Component Value Date/Time   LATICACIDVEN 2.9 (HH) 10/21/2018 1724    MICROBIOLOGY: Recent Results (from the past 240 hour(s))  Blood Culture (routine x 2)     Status: None   Collection Time: 10/21/18  3:15 AM   Result Value Ref Range Status   Specimen Description BLOOD RIGHT ARM  Final   Special Requests   Final    BOTTLES DRAWN AEROBIC AND ANAEROBIC Blood Culture adequate volume   Culture  Setup Time NO ORGANISMS SEEN  Final   Culture   Final    NO GROWTH 5 DAYS Performed at [facility], [address] [place], [place] [address]    Report Status 10/26/2018 FINAL  Final  Blood Culture (routine x 2)     Status: None   Collection Time: 10/21/18  3:30 AM  Result Value Ref Range  Status   Specimen Description BLOOD RIGHT HAND  Final   Special Requests   Final    BOTTLES DRAWN AEROBIC ONLY Blood Culture adequate volume   Culture   Final    NO GROWTH 5 DAYS Performed at [facility], [address], [place], [place] [address]    Report Status 10/26/2018 FINAL  Final  Urine culture     Status: None   Collection Time: 10/21/18  5:51 AM  Result Value Ref Range Status   Specimen Description URINE, CATHETERIZED  Final   Special Requests NONE  Final   Culture   Final    NO GROWTH Performed at [facility], [place] [address], [place], [place] [address]    Report Status 10/22/2018 FINAL  Final  MRSA PCR Screening     Status: Abnormal   Collection Time: 10/22/18  8:08 AM  Result Value Ref Range Status   MRSA by PCR POSITIVE (A) NEGATIVE Final    Comment:        The GeneXpert MRSA Assay (FDA approved for NASAL specimens only), is one component of a comprehensive MRSA colonization surveillance program. It is not intended to diagnose MRSA infection nor to guide or monitor treatment for MRSA infections. CRITICAL RESULT CALLED TO, READ BACK BY AND VERIFIED WITH: L. BENNET, RN AT 0945 ON 10/22/18 BY C. JESSUP, MLT. Performed at [facility], [place] [address], [place], [place] [address]     RADIOLOGY STUDIES/RESULTS: Ct Abdomen Pelvis Wo Contrast  Result Date: 10/21/2018 CLINICAL DATA:  Abdominal pain and confusion. EXAM: CT ABDOMEN AND PELVIS WITHOUT CONTRAST  TECHNIQUE: Multidetector CT imaging of the abdomen and pelvis was performed following the standard protocol without IV contrast. COMPARISON:  CT abdomen pelvis 11/18/2017 FINDINGS: LOWER CHEST: Multiple bibasilar pulmonary nodules, measuring up to 10 mm. HEPATOBILIARY: The hepatic contours and density are normal. There is no intra- or extrahepatic biliary dilatation. The gallbladder is normal. PANCREAS: The pancreatic parenchymal contours are normal and there is no ductal dilatation. There is no peripancreatic fluid collection. SPLEEN: Normal. ADRENALS/URINARY TRACT: --Adrenal glands: Normal. --Right kidney/ureter: There is moderate hydroureteronephrosis. No nephroureterolithiasis or other clear source of obstruction. --Left kidney/ureter: There is moderate hydroureteronephrosis. No nephroureterolithiasis or other clear source of obstruction. --Urinary bladder: Urinary bladder is decompressed. STOMACH/BOWEL: --Stomach/Duodenum: There is no hiatal hernia or other gastric abnormality. The duodenal course and caliber are normal. --Small bowel: No dilatation or inflammation. --Colon: No focal abnormality. --Appendix: Not visualized. No right lower quadrant inflammation or free fluid. VASCULAR/LYMPHATIC: Atherosclerotic calcification is present within the non-aneurysmal abdominal aorta, without hemodynamically significant stenosis. No abdominal or pelvic lymphadenopathy. REPRODUCTIVE: There is a T-shaped contraceptive device within the uterus. Area of hypoattenuation at the anterior uterine fundus appears unchanged, though poorly characterized without IV contrast MUSCULOSKELETAL. Multilevel degenerative disc disease and facet arthrosis. No bony spinal canal stenosis. OTHER: None. IMPRESSION: 1. Moderate bilateral hydroureteronephrosis. No nephroureterolithiasis or other clear source of obstruction. 2. Multiple bibasilar pulmonary nodules, measuring up to 10 mm, new since 11/18/17. These are concerning for metastatic  disease. 3. Limited assessment of the uterine parenchyma without IV contrast material. Generally unchanged appearance of anterior fundal region of hypoattenuation. Aortic Atherosclerosis (ICD10-I70.0). Electronically Signed   By: Ulyses Jarred M.D.   On: 10/21/2018 06:02   Dg Chest 2 View  Result Date: 10/26/2018 CLINICAL DATA:  Community acquired pneumonia. EXAM: CHEST - 2 VIEW COMPARISON:  Radiograph October 23, 2018. FINDINGS: Stable cardiomegaly. No pneumothorax is noted. No significant  pleural effusion is noted. Increased left upper lobe airspace opacity is noted concerning for worsening pneumonia. There also appears to be developing right upper lobe opacity which may represent pneumonia. Bony thorax is unremarkable. IMPRESSION: Increased bilateral upper lobe opacities are noted concerning for possible pneumonia. Continued radiographic follow-up is recommended. Electronically Signed   By: Marijo Conception, M.D.   On: 10/26/2018 10:18   Dg Chest 2 View  Result Date: 10/21/2018 CLINICAL DATA:  Acute onset of altered mental status. Confusion. EXAM: CHEST - 2 VIEW COMPARISON:  Chest radiograph performed 07/06/2018, and CTA of the chest performed 07/03/2018 FINDINGS: The lungs are well-aerated. Patchy bilateral airspace opacification is similar in appearance to prior studies and may reflect recurrent pulmonary edema or possibly pneumonia. A small left pleural effusion is again noted. No pneumothorax is seen. The cardiomediastinal silhouette is borderline normal in size. There appears to be a mildly displaced fracture of the left lateral fifth rib. IMPRESSION: 1. Patchy bilateral airspace opacification is similar in appearance to prior studies and may reflect recurrent pulmonary edema or possibly pneumonia. Small left pleural effusion again noted. 2. Apparent mildly displaced fracture of the left lateral fifth rib. Electronically Signed   By: Garald Balding M.D.   On: 10/21/2018 04:24   Ct Head Wo  Contrast  Result Date: 10/21/2018 CLINICAL DATA:  Acute onset of worsening confusion. Hematuria. EXAM: CT HEAD WITHOUT CONTRAST TECHNIQUE: Contiguous axial images were obtained from the base of the skull through the vertex without intravenous contrast. COMPARISON:  CT of the head performed 04/24/2018 FINDINGS: Brain: No evidence of acute infarction, hemorrhage, hydrocephalus, extra-axial collection or mass lesion / mass effect. Prominence of the ventricles and sulci reflects mild cortical volume loss. Diffuse periventricular and subcortical white matter change likely reflects small vessel ischemic microangiopathy. Chronic ischemic change is noted at the basal ganglia bilaterally. Mild cerebellar atrophy is noted. The brainstem and fourth ventricle are within normal limits. The basal ganglia are unremarkable in appearance. The cerebral hemispheres demonstrate grossly normal gray-white differentiation. No mass effect or midline shift is seen. Vascular: No hyperdense vessel or unexpected calcification. Skull: There is no evidence of fracture; visualized osseous structures are unremarkable in appearance. Sinuses/Orbits: The orbits are within normal limits. The paranasal sinuses and mastoid air cells are well-aerated. Other: No significant soft tissue abnormalities are seen. IMPRESSION: 1. No acute intracranial pathology seen on CT. 2. Mild cortical volume loss and diffuse small vessel ischemic microangiopathy. 3. Chronic ischemic change at the basal ganglia bilaterally. Electronically Signed   By: Garald Balding M.D.   On: 10/21/2018 05:55   Dg Chest Port 1 View  Result Date: 10/30/2018 CLINICAL DATA:  78 year old female with shortness of breath. Confused. Subsequent encounter. EXAM: PORTABLE CHEST 1 VIEW COMPARISON:  10/26/2018 and 10/23/2018 chest x-ray. 07/03/2018 chest CT. FINDINGS: Mild rotation.  Cardiomegaly.  Calcified aorta. Persistent bilateral upper lobe parenchymal changes may represent multifocal  infiltrates. Bibasilar infiltrates not excluded. Possible small left-sided pleural effusion. Remote left-sided rib fractures. IMPRESSION: 1. Similar appearance of bilateral upper lobe parenchymal changes possibly representing infiltrates. Bibasilar parenchymal changes may represent crowding of vasculature versus atelectasis or infiltrates and unchanged. 2. Small left-sided pleural effusion may be present. 3. Cardiomegaly. 4. Remote left rib fractures. 5.  Aortic Atherosclerosis (ICD10-I70.0). Electronically Signed   By: Genia Del M.D.   On: 10/30/2018 08:17   Dg Chest Port 1 View  Result Date: 10/23/2018 CLINICAL DATA:  Shortness of breath. EXAM: PORTABLE CHEST 1 VIEW COMPARISON:  Radiographs of  October 21, 2018. FINDINGS: Stable cardiomediastinal silhouette. No pneumothorax or pleural effusion is noted. Right lung is clear. Patchy opacity is noted in left midlung laterally concerning for possible pneumonia. Bony thorax is unremarkable. IMPRESSION: Probable left midlung pneumonia. Followup PA and lateral chest X-ray is recommended in 3-4 weeks following trial of antibiotic therapy to ensure resolution and exclude underlying malignancy. Electronically Signed   By: Marijo Conception, M.D.   On: 10/23/2018 07:54   Dg C-arm 1-60 Min-no Report  Result Date: 10/21/2018 Fluoroscopy was utilized by the requesting physician.  No radiographic interpretation.     LOS: 9 days   Signature  Lala Lund M.D on 10/30/2018 at 9:58 AM   -  To page go to [email or web address] - password [email or web address]

## 2018-10-30 NOTE — Progress Notes (Signed)
Plan for hospice noted.  We will sign off.  Please call with questions.  Thanks.

## 2018-10-31 LAB — BASIC METABOLIC PANEL
Anion gap: 14 (ref 5–15)
BUN: 52 mg/dL — ABNORMAL HIGH (ref 8–23)
CO2: 21 mmol/L — ABNORMAL LOW (ref 22–32)
Calcium: 9.8 mg/dL (ref 8.9–10.3)
Chloride: 108 mmol/L (ref 98–111)
Creatinine, Ser: 5.31 mg/dL — ABNORMAL HIGH (ref 0.44–1.00)
GFR calc Af Amer: 8 mL/min — ABNORMAL LOW (ref >60)
GFR calc non Af Amer: 7 mL/min — ABNORMAL LOW (ref >60)
Glucose, Bld: 112 mg/dL — ABNORMAL HIGH (ref 70–99)
Potassium: 3.4 mmol/L — ABNORMAL LOW (ref 3.5–5.1)
Sodium: 143 mmol/L (ref 135–145)

## 2018-10-31 LAB — MAGNESIUM: Magnesium: 1.7 mg/dL (ref 1.7–2.4)

## 2018-10-31 MED ORDER — SODIUM BICARBONATE 650 MG PO TABS: 650.0000 mg | ORAL_TABLET | Freq: Every day | ORAL | 0 refills | Status: AC

## 2018-10-31 MED ORDER — MORPHINE SULFATE (CONCENTRATE) 10 MG/0.5ML PO SOLN: 10.0000 mg | ORAL | 0 refills | Status: AC | PRN

## 2018-10-31 MED ORDER — PANTOPRAZOLE SODIUM 40 MG PO TBEC: 40.0000 mg | DELAYED_RELEASE_TABLET | Freq: Every day | ORAL | 0 refills | Status: AC

## 2018-10-31 MED ORDER — LORAZEPAM 2 MG/ML PO CONC: 1.0000 mg | Freq: Four times a day (QID) | ORAL | 0 refills | Status: AC | PRN

## 2018-10-31 MED ORDER — POLYETHYLENE GLYCOL 3350 17 G PO PACK: 17.0000 g | PACK | Freq: Every day | ORAL | 0 refills | Status: AC | PRN

## 2018-10-31 MED ORDER — ONDANSETRON HCL 4 MG PO TABS: 4.0000 mg | ORAL_TABLET | Freq: Three times a day (TID) | ORAL | 0 refills | Status: AC | PRN

## 2018-10-31 MED ORDER — METOPROLOL TARTRATE 50 MG PO TABS: 50.0000 mg | ORAL_TABLET | Freq: Two times a day (BID) | ORAL | 0 refills | Status: AC

## 2018-10-31 MED ORDER — POTASSIUM CHLORIDE CRYS ER 20 MEQ PO TBCR
20.0000 meq | EXTENDED_RELEASE_TABLET | Freq: Once | ORAL | Status: AC
  Administered 2018-10-31: 20 meq via ORAL
  Filled 2018-10-31: qty 1

## 2018-10-31 NOTE — Care Management Note (Signed)
Case Management Note Note initiated by Francee Piccolo, RN CM 10/29/2018  Patient Details  Name: Theresa Blake MRN: [id number] Date of Birth: Oct 12, 1941  Subjective/Objective:        Pt presented for metabolic encephalopathy with history of endometrial cancer and COPD.  Pt  found to have new bilateral hydronephrosis and new pulmonary mets with worsening kidney function.  Pt from home with son, Theresa Blake.  Pt's son, Theresa Blake [phone]) is the contact person and is very involved in the patient's care.  He works during the day, but is planning to take FMLA when patient returns home.   Pt is total care, requiring assistance to position in bed and a lift for transfers.  Pt has DME O2 but Theresa Blake will need to find out agency from brother, Theresa Blake.  Pt will need hospital bed prior to d/c and may need a mechanical lift.  Theresa Blake states the family is flexible on d/c date.  Pt will need ambulance transport home.  Action/Plan: 10/31/2018 [place], RN MSN CCM- Received call from [name] that [facility] will deliver equipment to the home. Spoke with POA, Theresa Blake, to arrange transport time-2pm. Call to [facility] to schedule. Notified Hospice of pick up time. Bedside RN aware of plans for patient to transition home today. No other transition of care needs identified.   Spoke with Theresa Blake over the phone at 5:40pm.  He states they would like to use [facility]. Spoke with Lattie Haw from [facility] who will hand-off to staff for tomorrow.  CM will need to call [facility] with details to make official referral tomorrow morning.   Expected Discharge Date:  10/31/18               Expected Discharge Plan:  Home w Hospice Care  In-House Referral:  Clinical Social Work  Discharge planning Services  CM Consult  Post Acute Care Choice:  Hospice Choice offered to:  Adult Children  DME Arranged:  Hospital bed, Oxygen DME Agency:  [facility]  HH Arranged:  RN, Nurse's  Aide [facility] Agency:  [facility]  Status of Service:  Completed, signed off  If discussed at [employer] of Stay Meetings, dates discussed:    Additional Comments:  Bartholomew Crews, RN 10/31/2018, 10:58 AM

## 2018-10-31 NOTE — Discharge Summary (Signed)
SHIMA COMPERE NTZ:[id number] DOB: Mar 22, 1941 DOA: 10/21/2018  PCP: Tracie Harrier, MD  Admit date: 10/21/2018  Discharge date: 10/31/2018  Admitted From: Home   Disposition:  Home Hospice   Recommendations for Outpatient Follow-up:   Follow up with PCP in 1-2 weeks  PCP Please obtain BMP/CBC, 2 view CXR in 1week,  (see Discharge instructions)   PCP Please follow up on the following pending results: She is being discharged with home hospice, if she does better in the next 2 weeks and then further diagnostic work-up can be done about lung nodule noticed on CT scan which was thought to be possibly metastatic.   Home Health: None Equipment/Devices: Per Hospice  Consultations: [place], Renal, Urology Discharge Condition: Guarded   CODE STATUS: DNR   Diet Recommendation: Heart Healthy - Soft    Chief Complaint  Patient presents with  . Altered Mental Status     Brief history of present illness from the day of admission and additional interim summary    Patient is a 78 y.o. female with history of morbid obesity, DVT/PE on anticoagulation, CKD stage III anterior cancer, chronic hypoxic respiratory failure on home O2 presented with acute metabolic encephalopathy in the setting of acute kidney injury.  She was found to have bilateral hydronephrosis-and underwent bilateral ureteral stent placement by urology.  Renal function slowly improving with supportive care.  See below for further details                                                                 Hospital Course   ARF: Thought to be secondary to obstructive uropathy from likely recurrent endometrial cancer causing bilateral hydronephrosis, she is status post ureteral stent placement bilaterally by urologist Dr. Diona Fanti.  By urology and nephrology, will  require outpatient urology follow-up within 7 to 10 days of discharge.    She was also seen by neurology initially was given IV fluids thereafter gentle Lasix, now renal function seems to have plateaued around 5.5, family does not desire HD or further invasive measures.    Plan is to transition her to home hospice, he was seen by palliative care I had detailed discussions with patient's son Richardson Landry on 10/31/2018, he is agreeable for home hospice.  She will be potentially discharged with gentle medical treatment to home with hospice.  Goal of care will be comfort..   Acute metabolic encephalopathy: Likely secondary to above-CT head negative on admission.  Will somewhat confused-but is able to follow some commands occasionally.   Continue supportive care.  Hyperkalemia: Resolved, likely secondary to AKI.  Acute metabolic acidosis: Improving, secondary to AKI.  On low-dose oral bicarb.  PNA: Afebrile-mild leukocytosis persists-has completed 7 days of Rocephin/Zithromax..    Hypothyroidism: Continue Synthroid  Dyslipidemia: Continue Vytorin  Hypertension: Controlled-continue metoprolol.  GERD:  Continue PPI  History of venous thromboembolism: Previously on Eliquis-physician to Coumadin now, after palliative care discussion long-term anticoagulation was discontinued by palliative team, currently goal of care is comfort if she somehow improves in the future this can be readdressed by PCP.  History of endometrial cancer with probable recurrence causing bilateral hydronephrosis and possible pulmonary metastases: Per family (sister at bedside on 1/3 and son over the phone on 1/4) patient was lost to follow-up with oncology-this was by choice.  At this time plan is no further testing or treatment, if she does better clinically in the next 2 weeks then she can follow-up with PCP and her primary oncologist and consider future line of care and diagnostics.  Morbid obesity - supportive  care.  Chronic hypoxic respiratory failure on home O2 3 L  Deconditioning/frailty: Not sure what her usual baseline is-but she appears incredibly weak-PT evaluation completed-SNF on discharge-however-family may consider home with hospice services.    Discharge diagnosis     Principal Problem:   AKI (acute kidney injury) ([place]) Active Problems:   Hypothyroidism   Diabetes mellitus type 2 in obese ([place])   Morbid obesity due to excess calories ([place])   Essential hypertension   PULMONARY EMBOLISM, HX OF   Endometrial cancer ([place])   Acute renal failure superimposed on stage 3 chronic kidney disease ([facility])   Bil Hydronephrosis--- No stones   Toxic metabolic encephalopathy   Pulmonary metastases (H/o endometrial carcinoma)   Delirium    Discharge instructions    Discharge Instructions    Diet - low sodium heart healthy   Complete by:  As directed    Discharge instructions   Complete by:  As directed    Disposition.  Home hospice Condition.  Guarded CODE STATUS.  DNR Activity.  With assistance as tolerated, full fall precautions. Diet.  Soft with feeding assistance and aspiration precautions. Goal of care.  Comfort.  Overall doing better follow with primary care physician in 2 weeks and discuss CT scan findings and future line of care.  Also get CBC and BMP checked in 2 weeks if doing better overall.   Increase activity slowly   Complete by:  As directed       Discharge Medications   Allergies as of 10/31/2018      Reactions   Anesthetics, Halogenated Shortness Of Breath   10/22/18 Spoke to patients son.  Shortness of breath occurred with Ether when patient was a child.     Levaquin [levofloxacin In D5w] Itching   Lamotrigine Itching   Tramadol Rash      Medication List    STOP taking these medications   clonazePAM 1 MG tablet Commonly known as:  KLONOPIN   diclofenac sodium 1 % Gel Commonly known as:  VOLTAREN   ELIQUIS 2.5 MG Tabs tablet Generic drug:   apixaban   metFORMIN 500 MG tablet Commonly known as:  GLUCOPHAGE   omeprazole 20 MG capsule Commonly known as:  PRILOSEC Replaced by:  pantoprazole 40 MG tablet     TAKE these medications   acetaminophen 325 MG tablet Commonly known as:  TYLENOL Take 650 mg by mouth every 6 (six) hours as needed for mild pain or headache.   aspirin 81 MG EC tablet Take 1 tablet (81 mg total) by mouth daily.   buPROPion 150 MG 24 hr tablet Commonly known as:  WELLBUTRIN XL Take 300 mg by mouth daily.   docusate sodium 100 MG capsule Commonly known as:  COLACE Take 100 capsules  by mouth daily as needed for mild constipation.   ezetimibe-simvastatin 10-20 MG tablet Commonly known as:  VYTORIN Take 1 tablet by mouth at bedtime.   fluticasone 50 MCG/ACT nasal spray Commonly known as:  FLONASE Place 2 sprays into both nostrils daily.   furosemide 40 MG tablet Commonly known as:  LASIX Take 1 tablet (40 mg total) by mouth as directed. 1 tablet Twice a day for 4 days, from 07/12/2018 take one tablet daily.   levothyroxine 100 MCG tablet Commonly known as:  SYNTHROID, LEVOTHROID Take 1 tablet (100 mcg total) by mouth daily before breakfast.   LORazepam 2 MG/ML concentrated solution Commonly known as:  ATIVAN Take 0.5 mLs (1 mg total) by mouth every 6 (six) hours as needed for anxiety.   metoprolol tartrate 50 MG tablet Commonly known as:  LOPRESSOR Take 1 tablet (50 mg total) by mouth 2 (two) times daily.   morphine CONCENTRATE 10 MG/0.5ML Soln concentrated solution Take 0.5 mLs (10 mg total) by mouth every 3 (three) hours as needed for moderate pain or severe pain.   multivitamin with minerals Tabs tablet Take 1 tablet by mouth daily.   ondansetron 4 MG tablet Commonly known as:  ZOFRAN Take 1 tablet (4 mg total) by mouth every 8 (eight) hours as needed for nausea or vomiting.   pantoprazole 40 MG tablet Commonly known as:  PROTONIX Take 1 tablet (40 mg total) by mouth  daily. Start taking on:  November 01, 2018 Replaces:  omeprazole 20 MG capsule   polyethylene glycol packet Commonly known as:  MIRALAX / GLYCOLAX Take 17 g by mouth daily as needed for mild constipation.   sodium bicarbonate 650 MG tablet Take 1 tablet (650 mg total) by mouth daily.   Venlafaxine HCl 225 MG Tb24 Take 225 mg by mouth daily.       Follow-up Information    Tracie Harrier, MD. Schedule an appointment as soon as possible for a visit in 2 week(s).   Specialty:  Internal Medicine Why:  if desired Contact information: [address] [place] [address] [phone]        Irine Seal, MD. Schedule an appointment as soon as possible for a visit in 2 week(s).   Specialty:  Urology Contact information: [place] [address] [phone]           Major procedures and Radiology Reports - PLEASE review detailed and final reports thoroughly  -      12/28>>Cystoscopy, bilateral retrograde ureteral pyelograms with fluoroscopic interpretation, placement of bilateral double-J stents    Ct Abdomen Pelvis Wo Contrast  Result Date: 10/21/2018 CLINICAL DATA:  Abdominal pain and confusion. EXAM: CT ABDOMEN AND PELVIS WITHOUT CONTRAST TECHNIQUE: Multidetector CT imaging of the abdomen and pelvis was performed following the standard protocol without IV contrast. COMPARISON:  CT abdomen pelvis 11/18/2017 FINDINGS: LOWER CHEST: Multiple bibasilar pulmonary nodules, measuring up to 10 mm. HEPATOBILIARY: The hepatic contours and density are normal. There is no intra- or extrahepatic biliary dilatation. The gallbladder is normal. PANCREAS: The pancreatic parenchymal contours are normal and there is no ductal dilatation. There is no peripancreatic fluid collection. SPLEEN: Normal. ADRENALS/URINARY TRACT: --Adrenal glands: Normal. --Right kidney/ureter: There is moderate hydroureteronephrosis. No nephroureterolithiasis or other clear  source of obstruction. --Left kidney/ureter: There is moderate hydroureteronephrosis. No nephroureterolithiasis or other clear source of obstruction. --Urinary bladder: Urinary bladder is decompressed. STOMACH/BOWEL: --Stomach/Duodenum: There is no hiatal hernia or other gastric abnormality. The duodenal course  and caliber are normal. --Small bowel: No dilatation or inflammation. --Colon: No focal abnormality. --Appendix: Not visualized. No right lower quadrant inflammation or free fluid. VASCULAR/LYMPHATIC: Atherosclerotic calcification is present within the non-aneurysmal abdominal aorta, without hemodynamically significant stenosis. No abdominal or pelvic lymphadenopathy. REPRODUCTIVE: There is a T-shaped contraceptive device within the uterus. Area of hypoattenuation at the anterior uterine fundus appears unchanged, though poorly characterized without IV contrast MUSCULOSKELETAL. Multilevel degenerative disc disease and facet arthrosis. No bony spinal canal stenosis. OTHER: None. IMPRESSION: 1. Moderate bilateral hydroureteronephrosis. No nephroureterolithiasis or other clear source of obstruction. 2. Multiple bibasilar pulmonary nodules, measuring up to 10 mm, new since 11/18/17. These are concerning for metastatic disease. 3. Limited assessment of the uterine parenchyma without IV contrast material. Generally unchanged appearance of anterior fundal region of hypoattenuation. Aortic Atherosclerosis (ICD10-I70.0). Electronically Signed   By: Ulyses Jarred M.D.   On: 10/21/2018 06:02   Dg Chest 2 View  Result Date: 10/26/2018 CLINICAL DATA:  Community acquired pneumonia. EXAM: CHEST - 2 VIEW COMPARISON:  Radiograph October 23, 2018. FINDINGS: Stable cardiomegaly. No pneumothorax is noted. No significant pleural effusion is noted. Increased left upper lobe airspace opacity is noted concerning for worsening pneumonia. There also appears to be developing right upper lobe opacity which may represent pneumonia.  Bony thorax is unremarkable. IMPRESSION: Increased bilateral upper lobe opacities are noted concerning for possible pneumonia. Continued radiographic follow-up is recommended. Electronically Signed   By: Marijo Conception, M.D.   On: 10/26/2018 10:18   Dg Chest 2 View  Result Date: 10/21/2018 CLINICAL DATA:  Acute onset of altered mental status. Confusion. EXAM: CHEST - 2 VIEW COMPARISON:  Chest radiograph performed 07/06/2018, and CTA of the chest performed 07/03/2018 FINDINGS: The lungs are well-aerated. Patchy bilateral airspace opacification is similar in appearance to prior studies and may reflect recurrent pulmonary edema or possibly pneumonia. A small left pleural effusion is again noted. No pneumothorax is seen. The cardiomediastinal silhouette is borderline normal in size. There appears to be a mildly displaced fracture of the left lateral fifth rib. IMPRESSION: 1. Patchy bilateral airspace opacification is similar in appearance to prior studies and may reflect recurrent pulmonary edema or possibly pneumonia. Small left pleural effusion again noted. 2. Apparent mildly displaced fracture of the left lateral fifth rib. Electronically Signed   By: Garald Balding M.D.   On: 10/21/2018 04:24   Ct Head Wo Contrast  Result Date: 10/21/2018 CLINICAL DATA:  Acute onset of worsening confusion. Hematuria. EXAM: CT HEAD WITHOUT CONTRAST TECHNIQUE: Contiguous axial images were obtained from the base of the skull through the vertex without intravenous contrast. COMPARISON:  CT of the head performed 04/24/2018 FINDINGS: Brain: No evidence of acute infarction, hemorrhage, hydrocephalus, extra-axial collection or mass lesion / mass effect. Prominence of the ventricles and sulci reflects mild cortical volume loss. Diffuse periventricular and subcortical white matter change likely reflects small vessel ischemic microangiopathy. Chronic ischemic change is noted at the basal ganglia bilaterally. Mild cerebellar atrophy  is noted. The brainstem and fourth ventricle are within normal limits. The basal ganglia are unremarkable in appearance. The cerebral hemispheres demonstrate grossly normal gray-white differentiation. No mass effect or midline shift is seen. Vascular: No hyperdense vessel or unexpected calcification. Skull: There is no evidence of fracture; visualized osseous structures are unremarkable in appearance. Sinuses/Orbits: The orbits are within normal limits. The paranasal sinuses and mastoid air cells are well-aerated. Other: No significant soft tissue abnormalities are seen. IMPRESSION: 1. No acute intracranial pathology seen on CT.  2. Mild cortical volume loss and diffuse small vessel ischemic microangiopathy. 3. Chronic ischemic change at the basal ganglia bilaterally. Electronically Signed   By: Garald Balding M.D.   On: 10/21/2018 05:55   Dg Chest Port 1 View  Result Date: 10/30/2018 CLINICAL DATA:  78 year old female with shortness of breath. Confused. Subsequent encounter. EXAM: PORTABLE CHEST 1 VIEW COMPARISON:  10/26/2018 and 10/23/2018 chest x-ray. 07/03/2018 chest CT. FINDINGS: Mild rotation.  Cardiomegaly.  Calcified aorta. Persistent bilateral upper lobe parenchymal changes may represent multifocal infiltrates. Bibasilar infiltrates not excluded. Possible small left-sided pleural effusion. Remote left-sided rib fractures. IMPRESSION: 1. Similar appearance of bilateral upper lobe parenchymal changes possibly representing infiltrates. Bibasilar parenchymal changes may represent crowding of vasculature versus atelectasis or infiltrates and unchanged. 2. Small left-sided pleural effusion may be present. 3. Cardiomegaly. 4. Remote left rib fractures. 5.  Aortic Atherosclerosis (ICD10-I70.0). Electronically Signed   By: Genia Del M.D.   On: 10/30/2018 08:17   Dg Chest Port 1 View  Result Date: 10/23/2018 CLINICAL DATA:  Shortness of breath. EXAM: PORTABLE CHEST 1 VIEW COMPARISON:  Radiographs of  October 21, 2018. FINDINGS: Stable cardiomediastinal silhouette. No pneumothorax or pleural effusion is noted. Right lung is clear. Patchy opacity is noted in left midlung laterally concerning for possible pneumonia. Bony thorax is unremarkable. IMPRESSION: Probable left midlung pneumonia. Followup PA and lateral chest X-ray is recommended in 3-4 weeks following trial of antibiotic therapy to ensure resolution and exclude underlying malignancy. Electronically Signed   By: Marijo Conception, M.D.   On: 10/23/2018 07:54   Dg C-arm 1-60 Min-no Report  Result Date: 10/21/2018 Fluoroscopy was utilized by the requesting physician.  No radiographic interpretation.    Micro Results     Recent Results (from the past 240 hour(s))  MRSA PCR Screening     Status: Abnormal   Collection Time: 10/22/18  8:08 AM  Result Value Ref Range Status   MRSA by PCR POSITIVE (A) NEGATIVE Final    Comment:        The GeneXpert MRSA Assay (FDA approved for NASAL specimens only), is one component of a comprehensive MRSA colonization surveillance program. It is not intended to diagnose MRSA infection nor to guide or monitor treatment for MRSA infections. CRITICAL RESULT CALLED TO, READ BACK BY AND VERIFIED WITH: L. BENNET, RN AT 0945 ON 10/22/18 BY C. JESSUP, MLT. Performed at [facility], [place] [address] [place], [place] [address]     Today   Subjective    Lauraann Missey today has no headache,no chest abdominal pain,no new weakness tingling or numbness, feels much better wants to go home today.    Objective   Blood pressure (!) 150/73, pulse 80, temperature (!) 97.4 F (36.3 C), temperature source Oral, resp. rate 18, height 5\' 5"  (1.651 m), weight 103.6 kg, SpO2 92 %.   Intake/Output Summary (Last 24 hours) at 10/31/2018 1019 Last data filed at 10/31/2018 0600 Gross per 24 hour  Intake 480 ml  Output 2050 ml  Net -1570 ml    Exam  Awake but pleasantly confused, No new F.N deficits, Normal  affect [place].AT,PERRAL Supple Neck,No JVD, No cervical lymphadenopathy appriciated.  Symmetrical Chest wall movement, Good air movement bilaterally, CTAB RRR,No Gallops,Rubs or new Murmurs, No Parasternal Heave +ve B.Sounds, Abd Soft, Non tender, No organomegaly appriciated, No rebound -guarding or rigidity. No Cyanosis, Clubbing or edema, No new Rash or bruise   Data Review   CBC w Diff:  Lab Results  Component Value Date   WBC 11.9 (H) 10/29/2018   HGB 8.9 (L) 10/29/2018   HCT 28.9 (L) 10/29/2018   PLT 349 10/29/2018   LYMPHOPCT 15 10/21/2018   MONOPCT 11 10/21/2018   EOSPCT 6 10/21/2018   BASOPCT 1 10/21/2018    CMP:  Lab Results  Component Value Date   NA 143 10/31/2018   K 3.4 (L) 10/31/2018   CL 108 10/31/2018   CO2 21 (L) 10/31/2018   BUN 52 (H) 10/31/2018   CREATININE 5.31 (H) 10/31/2018   PROT 6.8 10/21/2018   ALBUMIN 2.5 (L) 10/21/2018   BILITOT 0.5 10/21/2018   ALKPHOS 77 10/21/2018   AST 8 (L) 10/21/2018   ALT 5 10/21/2018  .   Total Time in preparing paper work, data evaluation and todays exam - 12 minutes  Lala Lund M.D on 10/31/2018 at 10:19 AM  [facility]   Office  [phone]

## 2018-10-31 NOTE — Discharge Instructions (Signed)
Disposition.  Home hospice Condition.  Guarded CODE STATUS.  DNR Activity.  With assistance as tolerated, full fall precautions. Diet.  Soft with feeding assistance and aspiration precautions. Goal of care.  Comfort.  Overall doing better follow with primary care physician in 2 weeks and discuss CT scan findings and future line of care.  Also get CBC and BMP checked in 2 weeks if doing better overall.

## 2018-10-31 NOTE — Progress Notes (Signed)
Patient discharged to home. Patient AVS reviewed and signed. Patient capable re-verbalizing medications and follow-up appointments. IV removed. Patient belongings sent with patient. Patient educated to return to the ED in the event of SOB, chest pain or dizziness.   Jaelene Garciagarcia B. RN 

## 2018-11-25 DEATH — deceased
